# Patient Record
Sex: Male | Born: 1935 | Race: White | Hispanic: No | Smoking: Never smoker
Health system: Southern US, Community
[De-identification: ages and names within clinical notes are randomized; demographics above are authoritative.]

## PROBLEM LIST (undated history)

## (undated) DIAGNOSIS — I452 Bifascicular block: Secondary | ICD-10-CM

## (undated) DIAGNOSIS — E119 Type 2 diabetes mellitus without complications: Secondary | ICD-10-CM

## (undated) DIAGNOSIS — I2510 Atherosclerotic heart disease of native coronary artery without angina pectoris: Secondary | ICD-10-CM

## (undated) DIAGNOSIS — I10 Essential (primary) hypertension: Secondary | ICD-10-CM

## (undated) DIAGNOSIS — I471 Supraventricular tachycardia, unspecified: Secondary | ICD-10-CM

## (undated) DIAGNOSIS — I5020 Unspecified systolic (congestive) heart failure: Secondary | ICD-10-CM

## (undated) HISTORY — DX: Supraventricular tachycardia: I47.1

## (undated) HISTORY — PX: CARDIAC SURGERY: SHX584

## (undated) HISTORY — DX: Supraventricular tachycardia, unspecified: I47.10

## (undated) NOTE — *Deleted (*Deleted)
Patient seen and discussed with PA Iran Ouch, I agree with her documentation. Admitted with acute systolic HF, new drop in LVEF to 25%. After discussion with patient, family and his primary cardiologist Dr Jens Som for now treat medically and have not elected for cath due to advanced age. 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  Significant diuresis this admission, by I/Os 9.3L. Yesterday we changed him to torsemide 40mg  daily. Cr overall stable on oral regimen. Will need close follow and repeat labs to reassess diuretic dosing.  Cough this morning, defer workup to primary team whether to recheck CXR/cbc  We will sign off inpatient care.    Dina Rich MD

---

## 2012-05-11 ENCOUNTER — Encounter: Payer: Self-pay | Admitting: Physician Assistant

## 2012-10-02 ENCOUNTER — Encounter (HOSPITAL_COMMUNITY): Payer: Self-pay | Admitting: *Deleted

## 2012-10-02 ENCOUNTER — Inpatient Hospital Stay (HOSPITAL_COMMUNITY)
Admission: EM | Admit: 2012-10-02 | Discharge: 2012-10-07 | DRG: 247 | Disposition: A | Discharge status: Home / Self Care | Payer: Medicare Other | Attending: Cardiology | Admitting: Cardiology

## 2012-10-02 ENCOUNTER — Emergency Department (HOSPITAL_COMMUNITY): Payer: Medicare Other

## 2012-10-02 DIAGNOSIS — I471 Supraventricular tachycardia, unspecified: Secondary | ICD-10-CM | POA: Diagnosis present

## 2012-10-02 DIAGNOSIS — E119 Type 2 diabetes mellitus without complications: Secondary | ICD-10-CM

## 2012-10-02 DIAGNOSIS — I2510 Atherosclerotic heart disease of native coronary artery without angina pectoris: Secondary | ICD-10-CM | POA: Diagnosis present

## 2012-10-02 DIAGNOSIS — I25810 Atherosclerosis of coronary artery bypass graft(s) without angina pectoris: Secondary | ICD-10-CM | POA: Diagnosis present

## 2012-10-02 DIAGNOSIS — Z87891 Personal history of nicotine dependence: Secondary | ICD-10-CM

## 2012-10-02 DIAGNOSIS — D649 Anemia, unspecified: Secondary | ICD-10-CM

## 2012-10-02 DIAGNOSIS — I2582 Chronic total occlusion of coronary artery: Secondary | ICD-10-CM | POA: Diagnosis present

## 2012-10-02 DIAGNOSIS — Z7902 Long term (current) use of antithrombotics/antiplatelets: Secondary | ICD-10-CM

## 2012-10-02 DIAGNOSIS — R509 Fever, unspecified: Secondary | ICD-10-CM

## 2012-10-02 DIAGNOSIS — I10 Essential (primary) hypertension: Secondary | ICD-10-CM | POA: Diagnosis present

## 2012-10-02 DIAGNOSIS — Z79899 Other long term (current) drug therapy: Secondary | ICD-10-CM

## 2012-10-02 DIAGNOSIS — Z8249 Family history of ischemic heart disease and other diseases of the circulatory system: Secondary | ICD-10-CM

## 2012-10-02 DIAGNOSIS — Z23 Encounter for immunization: Secondary | ICD-10-CM

## 2012-10-02 DIAGNOSIS — Z7982 Long term (current) use of aspirin: Secondary | ICD-10-CM

## 2012-10-02 DIAGNOSIS — Z955 Presence of coronary angioplasty implant and graft: Secondary | ICD-10-CM

## 2012-10-02 DIAGNOSIS — R7301 Impaired fasting glucose: Secondary | ICD-10-CM

## 2012-10-02 DIAGNOSIS — I214 Non-ST elevation (NSTEMI) myocardial infarction: Principal | ICD-10-CM | POA: Diagnosis present

## 2012-10-02 HISTORY — DX: Atherosclerotic heart disease of native coronary artery without angina pectoris: I25.10

## 2012-10-02 HISTORY — DX: Bifascicular block: I45.2

## 2012-10-02 HISTORY — DX: Type 2 diabetes mellitus without complications: E11.9

## 2012-10-02 HISTORY — DX: Essential (primary) hypertension: I10

## 2012-10-02 LAB — COMPREHENSIVE METABOLIC PANEL
ALT: 15 U/L (ref 0–53)
AST: 36 U/L (ref 0–37)
Albumin: 3.6 g/dL (ref 3.5–5.2)
Chloride: 99 mEq/L (ref 96–112)
Creatinine, Ser: 1.23 mg/dL (ref 0.50–1.35)
Sodium: 137 mEq/L (ref 135–145)
Total Bilirubin: 0.6 mg/dL (ref 0.3–1.2)

## 2012-10-02 LAB — CBC
MCV: 90.8 fL (ref 78.0–100.0)
Platelets: 140 10*3/uL — ABNORMAL LOW (ref 150–400)
RDW: 12.7 % (ref 11.5–15.5)
WBC: 5.6 10*3/uL (ref 4.0–10.5)

## 2012-10-02 LAB — GLUCOSE, CAPILLARY: Glucose-Capillary: 172 mg/dL — ABNORMAL HIGH (ref 70–99)

## 2012-10-02 LAB — POCT I-STAT TROPONIN I

## 2012-10-02 LAB — TSH: TSH: 1.677 u[IU]/mL (ref 0.350–4.500)

## 2012-10-02 LAB — PROTIME-INR: Prothrombin Time: 13.7 seconds (ref 11.6–15.2)

## 2012-10-02 LAB — MAGNESIUM: Magnesium: 2.1 mg/dL (ref 1.5–2.5)

## 2012-10-02 MED ORDER — ASPIRIN 325 MG PO TABS
325.0000 mg | ORAL_TABLET | Freq: Once | ORAL | Status: AC
  Administered 2012-10-02: 325 mg via ORAL
  Filled 2012-10-02: qty 1

## 2012-10-02 MED ORDER — CLOPIDOGREL BISULFATE 75 MG PO TABS
75.0000 mg | ORAL_TABLET | Freq: Every day | ORAL | Status: DC
  Administered 2012-10-03 – 2012-10-07 (×5): 75 mg via ORAL
  Filled 2012-10-02 (×7): qty 1

## 2012-10-02 MED ORDER — INFLUENZA VIRUS VACC SPLIT PF IM SUSP
0.5000 mL | INTRAMUSCULAR | Status: AC
  Filled 2012-10-02: qty 0.5

## 2012-10-02 MED ORDER — LOSARTAN POTASSIUM 50 MG PO TABS
50.0000 mg | ORAL_TABLET | Freq: Every day | ORAL | Status: DC
  Administered 2012-10-03 – 2012-10-07 (×5): 50 mg via ORAL
  Filled 2012-10-02 (×5): qty 1

## 2012-10-02 MED ORDER — INSULIN ASPART 100 UNIT/ML ~~LOC~~ SOLN
0.0000 [IU] | Freq: Three times a day (TID) | SUBCUTANEOUS | Status: DC
  Administered 2012-10-02 – 2012-10-04 (×3): 2 [IU] via SUBCUTANEOUS
  Administered 2012-10-05: 3 [IU] via SUBCUTANEOUS
  Administered 2012-10-05: 2 [IU] via SUBCUTANEOUS
  Administered 2012-10-06 – 2012-10-07 (×2): 1 [IU] via SUBCUTANEOUS

## 2012-10-02 MED ORDER — PNEUMOCOCCAL VAC POLYVALENT 25 MCG/0.5ML IJ INJ
0.5000 mL | INJECTION | INTRAMUSCULAR | Status: AC
  Filled 2012-10-02: qty 0.5

## 2012-10-02 MED ORDER — ATORVASTATIN CALCIUM 10 MG PO TABS
10.0000 mg | ORAL_TABLET | Freq: Every day | ORAL | Status: DC
  Administered 2012-10-02 – 2012-10-06 (×5): 10 mg via ORAL
  Filled 2012-10-02 (×7): qty 1

## 2012-10-02 MED ORDER — NITROGLYCERIN 0.4 MG SL SUBL: 0.4000 mg | SUBLINGUAL_TABLET | SUBLINGUAL | Status: DC | PRN

## 2012-10-02 MED ORDER — ASPIRIN 81 MG PO CHEW
324.0000 mg | CHEWABLE_TABLET | ORAL | Status: AC
  Administered 2012-10-03: 324 mg via ORAL
  Filled 2012-10-02: qty 4

## 2012-10-02 MED ORDER — CLOPIDOGREL BISULFATE 300 MG PO TABS
300.0000 mg | ORAL_TABLET | Freq: Once | ORAL | Status: AC
  Administered 2012-10-02: 300 mg via ORAL
  Filled 2012-10-02: qty 1

## 2012-10-02 MED ORDER — DOXAZOSIN MESYLATE 4 MG PO TABS
4.0000 mg | ORAL_TABLET | Freq: Every day | ORAL | Status: DC
  Administered 2012-10-02 – 2012-10-06 (×5): 4 mg via ORAL
  Filled 2012-10-02 (×7): qty 1

## 2012-10-02 MED ORDER — HEPARIN BOLUS VIA INFUSION
4000.0000 [IU] | Freq: Once | INTRAVENOUS | Status: AC
  Administered 2012-10-02: 4000 [IU] via INTRAVENOUS

## 2012-10-02 MED ORDER — SODIUM CHLORIDE 0.9 % IJ SOLN: 3.0000 mL | INTRAMUSCULAR | Status: DC | PRN

## 2012-10-02 MED ORDER — SODIUM CHLORIDE 0.9 % IV SOLN
INTRAVENOUS | Status: DC
  Administered 2012-10-03 (×2): via INTRAVENOUS

## 2012-10-02 MED ORDER — SODIUM CHLORIDE 0.9 % IV SOLN
250.0000 mL | INTRAVENOUS | Status: DC | PRN
  Administered 2012-10-03: 250 mL via INTRAVENOUS

## 2012-10-02 MED ORDER — ACETAMINOPHEN 325 MG PO TABS
650.0000 mg | ORAL_TABLET | ORAL | Status: DC | PRN
  Administered 2012-10-03 – 2012-10-05 (×3): 650 mg via ORAL
  Filled 2012-10-02 (×3): qty 2

## 2012-10-02 MED ORDER — HEPARIN (PORCINE) IN NACL 100-0.45 UNIT/ML-% IJ SOLN
1000.0000 [IU]/h | INTRAMUSCULAR | Status: DC
  Administered 2012-10-02 – 2012-10-03 (×2): 1000 [IU]/h via INTRAVENOUS
  Filled 2012-10-02 (×4): qty 250

## 2012-10-02 MED ORDER — METOPROLOL TARTRATE 12.5 MG HALF TABLET
12.5000 mg | ORAL_TABLET | Freq: Two times a day (BID) | ORAL | Status: DC
  Administered 2012-10-02 – 2012-10-03 (×3): 12.5 mg via ORAL
  Filled 2012-10-02 (×4): qty 1

## 2012-10-02 MED ORDER — SODIUM CHLORIDE 0.9 % IJ SOLN
3.0000 mL | Freq: Two times a day (BID) | INTRAMUSCULAR | Status: DC
  Administered 2012-10-02 – 2012-10-05 (×7): 3 mL via INTRAVENOUS
  Administered 2012-10-06: 13:00:00 via INTRAVENOUS

## 2012-10-02 MED ORDER — ASPIRIN EC 81 MG PO TBEC
81.0000 mg | DELAYED_RELEASE_TABLET | Freq: Every day | ORAL | Status: DC
  Administered 2012-10-03 – 2012-10-07 (×5): 81 mg via ORAL
  Filled 2012-10-02 (×5): qty 1

## 2012-10-02 MED ORDER — ONDANSETRON HCL 4 MG/2ML IJ SOLN: 4.0000 mg | Freq: Four times a day (QID) | INTRAMUSCULAR | Status: DC | PRN

## 2012-10-02 NOTE — Progress Notes (Signed)
ANTICOAGULATION CONSULT NOTE - Follow-Up Consult  Pharmacy Consult for heparin Indication: chest pain/ACS  No Known Allergies  Patient Measurements: Height: 5\' 11"  (180.3 cm) Weight: 171 lb 11.8 oz (77.9 kg) IBW/kg (Calculated) : 75.3  Heparin Dosing Weight: 78.2kg  Vital Signs: Temp: 98.5 F (36.9 C) (01/27 2000) Temp src: Oral (01/27 2000) BP: 117/81 mmHg (01/27 2000) Pulse Rate: 126  (01/27 2000)  Labs:  Basename 10/02/12 2010 10/02/12 1619 10/02/12 1050  HGB -- -- 13.0  HCT -- -- 38.7*  PLT -- -- 140*  APTT -- -- --  LABPROT -- 13.7 --  INR -- 1.06 --  HEPARINUNFRC 0.41 -- --  CREATININE -- -- 1.23  CKTOTAL -- -- --  CKMB -- -- --  TROPONINI -- 1.69* --    Estimated Creatinine Clearance: 44.2 ml/min (by C-G formula based on Cr of 1.23).   Medical History: Past Medical History  Diagnosis Date  . Coronary artery disease     a. CABG 1993 at [facility], without cath since.  . Diabetes mellitus without complication   . HTN (hypertension)     Medications:  Infusions:     . sodium chloride    . heparin 1,000 Units/hr (10/02/12 2000)    Assessment: Heparin level therapeutic. No bleeding noted. Noted plan for cardiac cath tomorrow.  Goal of Therapy:  Heparin level 0.3-0.7 units/ml Monitor platelets by anticoagulation protocol: Yes   Plan:  1. Continue Heparin gtt at 1000 units/hr 2. Daily heparin level and CBC  Christoper Fabian, PharmD, BCPS Clinical pharmacist, pager [phone] 10/02/2012,8:55 PM

## 2012-10-02 NOTE — Progress Notes (Addendum)
Per discussion with Dr. Jens Som, will load with Plavix 300mg  today and start 75mg  daily tomorrow. Plan cardiac cath tomorrow. Dayna Dunn PA-C

## 2012-10-02 NOTE — Progress Notes (Signed)
Utilization Review Completed.Joud Ingwersen T12/03/2013  

## 2012-10-02 NOTE — ED Provider Notes (Signed)
History     CSN: [id number]  Arrival date & time 10/02/12  1029   First MD Initiated Contact with Patient 10/02/12 1058      Chief Complaint  Patient presents with  . Chest Pain    (Consider location/radiation/quality/duration/timing/severity/associated sxs/prior treatment) The history is provided by the patient.  Franklin Gonzalez is a 79 y.o. male history of CAD with no stents, diabetes, hypertension here presenting with chest pain. Substernal chest pain, a dull achy feeling for the last 3 days. He was worse with exertion and not pleuritic in nature. No fevers or chills. No history of cardiac stents or heart failure. No recent travel or PE risk factors. Has positive troponin in triage. Currently pain free.    Past Medical History  Diagnosis Date  . Coronary artery disease     a. CABG 1993 at [facility], without cath since.  . Diabetes mellitus without complication   . HTN (hypertension)     Past Surgical History  Procedure Date  . Cardiac surgery     1993 CABG    Family History  Problem Relation Age of Onset  . CAD      multiple relatives including father and son    History  Substance Use Topics  . Smoking status: Former Games developer  . Smokeless tobacco: Not on file  . Alcohol Use: No      Review of Systems  Cardiovascular: Positive for chest pain.  All other systems reviewed and are negative.    Allergies  Review of patient's allergies indicates no known allergies.  Home Medications   Current Outpatient Rx  Name  Route  Sig  Dispense  Refill  . ATORVASTATIN CALCIUM 10 MG PO TABS   Oral   Take 10 mg by mouth at bedtime.         Marland Kitchen DOXAZOSIN MESYLATE 4 MG PO TABS   Oral   Take 4 mg by mouth at bedtime.         Marland Kitchen LOSARTAN POTASSIUM-HCTZ 50-12.5 MG PO TABS   Oral   Take 1 tablet by mouth daily.         Marland Kitchen METFORMIN HCL ER 500 MG PO TB24   Oral   Take 500 mg by mouth daily with breakfast.           BP 114/82  Pulse 125  Temp 97.7 F (36.5 C)  (Oral)  Resp 23  SpO2 100%  Physical Exam  Nursing note and vitals reviewed. Constitutional: He is oriented to person, place, and time. He appears well-developed and well-nourished.       NAD, comfortable   HENT:  Head: Normocephalic.  Mouth/Throat: Oropharynx is clear and moist.  Eyes: Conjunctivae normal are normal. Pupils are equal, round, and reactive to light.  Neck: Normal range of motion. Neck supple.  Cardiovascular: Normal rate, regular rhythm and normal heart sounds.   Pulmonary/Chest: Effort normal and breath sounds normal. No respiratory distress. He has no wheezes. He has no rales.  Abdominal: Soft. Bowel sounds are normal. He exhibits no distension. There is no tenderness. There is no rebound.  Musculoskeletal: Normal range of motion. He exhibits no edema and no tenderness.  Neurological: He is alert and oriented to person, place, and time.  Skin: Skin is warm and dry.  Psychiatric: He has a normal mood and affect. His behavior is normal. Judgment and thought content normal.    ED Course  Procedures (including critical care time)  CRITICAL CARE Performed by: Chaney Malling  Total critical care time: 30 min   Critical care time was exclusive of separately billable procedures and treating other patients.  Critical care was necessary to treat or prevent imminent or life-threatening deterioration.  Critical care was time spent personally by me on the following activities: development of treatment plan with patient and/or surrogate as well as nursing, discussions with consultants, evaluation of patient's response to treatment, examination of patient, obtaining history from patient or surrogate, ordering and performing treatments and interventions, ordering and review of laboratory studies, ordering and review of radiographic studies, pulse oximetry and re-evaluation of patient's condition.   Labs Reviewed  CBC - Abnormal; Notable for the following:    HCT 38.7 (*)      Platelets 140 (*)     All other components within normal limits  COMPREHENSIVE METABOLIC PANEL - Abnormal; Notable for the following:    Glucose, Bld 119 (*)     GFR calc non Af Amer 51 (*)     GFR calc Af Amer 59 (*)     All other components within normal limits  POCT I-STAT TROPONIN I - Abnormal; Notable for the following:    Troponin i, poc 1.34 (*)     All other components within normal limits  HEPARIN LEVEL (UNFRACTIONATED)   Dg Chest 2 View  10/02/2012  *RADIOLOGY REPORT*  Clinical Data: Chest pain.  CHEST - 2 VIEW  Comparison: 06/24/2010.  Findings: The cardiac silhouette, mediastinal and hilar contours are within normal limits.  There are surgical changes from bypass surgery.  The lungs demonstrate mild hyperinflation but no infiltrates, edema or effusions.  No pneumothorax.  Minimal scarring changes.  Bilateral nipple shadows are noted.  The bony thorax is intact.  IMPRESSION: No acute cardiopulmonary findings.   Original Report Authenticated By: Rudie Meyer, M.D.      1. Acute myocardial infarction, subendocardial infarction, initial episode of care      Date: 10/02/2012- 1  Rate: 76  Rhythm: sinus tachycardia  QRS Axis: normal  Intervals: normal  ST/T Wave abnormalities: ST depressions inferiorly and ST depressions laterally  Conduction Disutrbances:second-degree A-V block, ( Mobitz I )  Narrative Interpretation:   Old EKG Reviewed: none available   Date: 10/02/2012- 2   Rate: 73  Rhythm: normal sinus rhythm  QRS Axis: normal  Intervals: normal  ST/T Wave abnormalities: nonspecific ST changes  Conduction Disutrbances:none  Narrative Interpretation: + PAC, not heart block as previous EKG in the same day   Old EKG Reviewed: unchanged     MDM  Franklin Gonzalez is a 79 y.o. male here with chest pain + troponin, likely NSTEMI. Will give ASA, start heparin drip.   12: 10 PM Patient still pain free. I called [facility] to evaluate patient.   1:08 PM Patient  evaluated by Corinda Gubler, will admit for NSTEMI.         Richardean Canal, MD 10/02/12 1309

## 2012-10-02 NOTE — ED Notes (Signed)
Cardiology at bedside.

## 2012-10-02 NOTE — ED Notes (Signed)
Pt is here with 4 day history of uncomfortable feeling in chest and no other associated symptoms.  Pt is alert and oriented.  CABG 1993

## 2012-10-02 NOTE — H&P (Signed)
History and Physical  Patient ID: Franklin Gonzalez MRN: [id number], DOB: 1922-03-17 Date of Encounter: 10/02/2012, 12:35 PM Primary Physician: Louie Boston, MD Primary Cardiologist: New to [facility]. Remotely seen at [facility]. Lives in [place]  Chief Complaint: chest pain Reason for Admit: NSTEMI  HPI: Franklin Gonzalez is an 79 y/o fairly functional WM with history of CAD s/p CABG 1993 without cath since, DM, HTN, and dizziness lasting several hours one morning 08/2012 evaluated at [facility] without obvious etiology per family (reported CT head neg per family). He lives in [place] in a 2-story home with his wife. He presented to [facility] with 4-day history of intermittent L>R sided chest pain described as an "uncomfortable" ache, worse with exertion, typically relieved by rest. Yesterday he walked 6/10 of a mile to church and developed CP. This eventually eased off. No SOB, diaphoresis, dizziness, nausea, or vomiting. Up until a week ago, he was walking 2 miles per day without complication. He had persistent discomfort this AM and told his son who brought him straight to the [facility] ER. He was found to have an initial troponin of 1.34, labs otherwise significant for glu 119, plt 140, Cr 1.23 with CrCl ~45. CXR nonacute. He is currently getting 325mg  ASA and full-dose heparin by EDP. Initial EKG demonstrated NSR with multiple PACs, RBBB, LAFB and nonspecific ST-T changes. He also went into a tachycardic, regular rhythm at 125bpm while in the ER, same axis as admitting rhythm. With carotid massage by Dr. Jens Som, he went back into NSR with PACs without any obvious flutter waves raising suspicion for SVT. He is currently chest pain free.  Past Medical History  Diagnosis Date  . Coronary artery disease     a. CABG 1993 at [facility], without cath since.  . Diabetes mellitus without complication   . HTN (hypertension)   . Syncope     a. 08/2012, evaluated at [facility].     Most Recent Cardiac Studies: none    Surgical History:  Past Surgical History  Procedure Date  . Cardiac surgery     1993 CABG     Home Meds: Prior to Admission medications   Medication Sig Start Date End Date Taking? Authorizing Provider  atorvastatin (LIPITOR) 10 MG tablet Take 10 mg by mouth at bedtime.   Yes Historical Provider, MD  doxazosin (CARDURA) 4 MG tablet Take 4 mg by mouth at bedtime.   Yes Historical Provider, MD  losartan-hydrochlorothiazide (HYZAAR) 50-12.5 MG per tablet Take 1 tablet by mouth daily.   Yes Historical Provider, MD  metFORMIN (GLUCOPHAGE-XR) 500 MG 24 hr tablet Take 500 mg by mouth daily with breakfast.   Yes Historical Provider, MD    Allergies: No Known Allergies  History   Social History  . Marital Status: Married    Spouse Name: N/A    Number of Children: N/A  . Years of Education: N/A   Occupational History  . Not on file.   Social History Main Topics  . Smoking status: Former Games developer  . Smokeless tobacco: Not on file  . Alcohol Use: No  . Drug Use: No  . Sexually Active:    Other Topics Concern  . Not on file   Social History Narrative  . No narrative on file     Family History  Problem Relation Age of Onset  . CAD      multiple relatives including father and son    Review of Systems: General: negative for chills, fever, night sweats or weight  changes.  Cardiovascular: no orthopnea, PND. Tr LEE L>R goes down at night Dermatological: negative for rash Respiratory: negative for cough or wheezing Urologic: negative for hematuria Abdominal: negative for nausea, vomiting, diarrhea, bright red blood per rectum, melena, or hematemesis Neurologic: negative for visual changes, syncope, or dizziness All other systems reviewed and are otherwise negative except as noted above.  Labs:   Lab Results  Component Value Date   WBC 5.6 10/02/2012   HGB 13.0 10/02/2012   HCT 38.7* 10/02/2012   MCV 90.8 10/02/2012   PLT 140* 10/02/2012     Lab 10/02/12 1050  NA 137   K 4.4  CL 99  CO2 29  BUN 21  CREATININE 1.23  CALCIUM 9.6  PROT 6.3  BILITOT 0.6  ALKPHOS 76  ALT 15  AST 36  GLUCOSE 119*   POC Troponin 1.34  Radiology/Studies:  Dg Chest 2 View 10/02/2012  *RADIOLOGY REPORT*  Clinical Data: Chest pain.  CHEST - 2 VIEW  Comparison: 06/24/2010.  Findings: The cardiac silhouette, mediastinal and hilar contours are within normal limits.  There are surgical changes from bypass surgery.  The lungs demonstrate mild hyperinflation but no infiltrates, edema or effusions.  No pneumothorax.  Minimal scarring changes.  Bilateral nipple shadows are noted.  The bony thorax is intact.  IMPRESSION: No acute cardiopulmonary findings.   Original Report Authenticated By: Rudie Meyer, M.D.     EKG: NSR 76bpm with multiple PACs, RBBB, LAFB, slight ST depression II, V4-V6 (max 1mm in V4, otherwise 0.73mm), no prior to compare to F/u tracing while tachycardic: SVT (regular, tachycardic) 127bpm, RBBB, LAFB, new ST depression noted in V2, otherwise similar more subtle ST depression II, V3-V6 - no flutter waves apparent with carotid massage  Physical Exam: Blood pressure 165/79, pulse 73, temperature 97.7 F (36.5 C), temperature source Oral, resp. rate 17, SpO2 100.00%. General: Well developed, well nourished elderly WM in no acute distress. Head: Normocephalic, atraumatic, sclera non-icteric, no xanthomas, nares are without discharge.  Neck: Negative for carotid bruits. JVD not elevated. Lungs: Clear bilaterally to auscultation without wheezes, rales, or rhonchi. Breathing is unlabored. Heart: Irregular rhythm, controlled rate with S1 S2. No murmurs, rubs, or gallops appreciated. Abdomen: Soft, non-tender, non-distended with normoactive bowel sounds. No hepatomegaly. No rebound/guarding. No obvious abdominal masses. Msk:  Strength and tone appear normal for age. Extremities: No clubbing or cyanosis. No edema.  Distal pedal pulses are 2+ and equal bilaterally. Neuro:  Alert and oriented X 3. No focal deficit. No facial asymmetry. Moves all extremities spontaneously. Psych:  Responds to questions appropriately with a normal affect.    ASSESSMENT AND PLAN:   1. NSTEMI with history of CAD s/p CABG 1993 - received ASA, heparin. Continue statin. See below for comprehensive thoughts after discussion with Dr. Jens Som. 2. SVT - no flutter waves with carotid massage. See below. 3. Bifasicular block - will need to monitor HR closely with AV nodal agents. 4. Dizziness 08/2012 - will request records from [facility]. 5. HTN - watch BP with measures initiated for the above. 6. Diabetes mellitus - hold metformin and add SSI. 7. Renal insufficiency, stage III - baseline unavailable, follow Cr, may be chronic. 8. Code status - per our discussion, patient has not given much thought to EOL decisions and would like to think more about it. In the meantime, he will continue being a full code.  Signed, Dayna Dunn PA-C 10/02/2012, 12:35 PM  As above, patient seen and examined. Briefly he is a  pleasant 79 year old male with past medical history of coronary artery disease status post coronary artery bypass and graft at [facility] in 1993. He also has diabetes, hypertension. Over the past 4 days he has had intermittent left-sided chest pain. It is described as a dull ache. It initially occurred with exercise and was relieved with rest. He had a more prolonged episode today and told his son who brought him to the emergency room. He is presently pain-free. He did not have nausea, diaphoresis or dyspnea. While in the emergency room he was noted to go into SVT at a rate of 127. He converted to sinus rhythm with carotid massage. Note he was admitted to [facility] in December with dizziness. A CT of his head was performed and I do not have those results. Initial troponin 1.34. Electrocardiogram shows sinus rhythm, right bundle branch block, left anterior fascicular block,  inferior lateral ST depression and PACs. Patient has ruled in for a non-ST elevation myocardial infarction. Plan admit and treat with aspirin, Plavix and heparin. Add metoprolol 12.5 mg by mouth twice a day. Watch for bradycardia given baseline bifascicular block. Add statin. Proceed with cardiac catheterization. The risks and benefits were discussed and the patient agrees to proceed. We will obtain the results of his recent head CT from [facility]. Patient also has supraventricular tachycardia that terminated with carotid massage. We will treat with a beta blocker. If he has recurrent episodes despite medications he will be considered for ablation. Check echo and TSH. Olga Millers 12:52 PM

## 2012-10-02 NOTE — ED Notes (Signed)
Results of troponin shown to Dr. Chaney Malling

## 2012-10-02 NOTE — Progress Notes (Signed)
ANTICOAGULATION CONSULT NOTE - Initial Consult  Pharmacy Consult for heparin Indication: chest pain/ACS  No Known Allergies  Patient Measurements:   Heparin Dosing Weight: 78.2kg  Vital Signs: Temp: 97.7 F (36.5 C) (01/27 1038) Temp src: Oral (01/27 1038) BP: 157/87 mmHg (01/27 1038) Pulse Rate: 125  (01/27 1038)  Labs:  Basename 10/02/12 1050  HGB 13.0  HCT 38.7*  PLT 140*  APTT --  LABPROT --  INR --  HEPARINUNFRC --  CREATININE --  CKTOTAL --  CKMB --  TROPONINI --    CrCl is unknown because no creatinine reading has been taken and the patient has no height on file.   Medical History: Past Medical History  Diagnosis Date  . Coronary artery disease   . Diabetes mellitus without complication     Medications:  Infusions:    . heparin    . heparin      Assessment: 48 yom with history of CAD s/p CABG presents with CP and elevated troponins. To begin IV heparin for anticoagulation. Baseline H/H is WNL but plts are slightly low at 140. No anticoagulants PTA.   Goal of Therapy:  Heparin level 0.3-0.7 units/ml Monitor platelets by anticoagulation protocol: Yes   Plan:  1. Heparin bolus 4000 units IV x 1 2. Heparin gtt 1000 units/hr 3. Check an 8 hour heparin level 4. Daily heparin level and CBC  Ramonte Mena, Drake Leach 10/02/2012,11:44 AM

## 2012-10-03 ENCOUNTER — Encounter (HOSPITAL_COMMUNITY): Payer: Self-pay | Admitting: Internal Medicine

## 2012-10-03 ENCOUNTER — Encounter (HOSPITAL_COMMUNITY)
Admission: EM | Disposition: A | Discharge status: Home / Self Care | Payer: Self-pay | Source: Home / Self Care | Attending: Cardiology

## 2012-10-03 DIAGNOSIS — R7301 Impaired fasting glucose: Secondary | ICD-10-CM | POA: Diagnosis present

## 2012-10-03 DIAGNOSIS — I219 Acute myocardial infarction, unspecified: Secondary | ICD-10-CM

## 2012-10-03 DIAGNOSIS — I10 Essential (primary) hypertension: Secondary | ICD-10-CM | POA: Diagnosis present

## 2012-10-03 DIAGNOSIS — I471 Supraventricular tachycardia, unspecified: Secondary | ICD-10-CM | POA: Diagnosis present

## 2012-10-03 DIAGNOSIS — E119 Type 2 diabetes mellitus without complications: Secondary | ICD-10-CM

## 2012-10-03 DIAGNOSIS — I369 Nonrheumatic tricuspid valve disorder, unspecified: Secondary | ICD-10-CM

## 2012-10-03 LAB — CBC
MCH: 31 pg (ref 26.0–34.0)
MCHC: 34.1 g/dL (ref 30.0–36.0)
MCV: 90.7 fL (ref 78.0–100.0)
Platelets: 127 10*3/uL — ABNORMAL LOW (ref 150–400)
RDW: 12.6 % (ref 11.5–15.5)

## 2012-10-03 LAB — LIPID PANEL
Cholesterol: 126 mg/dL (ref 0–200)
VLDL: 8 mg/dL (ref 0–40)

## 2012-10-03 LAB — TROPONIN I: Troponin I: 2.03 ng/mL (ref <0.30)

## 2012-10-03 LAB — GLUCOSE, CAPILLARY
Glucose-Capillary: 125 mg/dL — ABNORMAL HIGH (ref 70–99)
Glucose-Capillary: 102 mg/dL — ABNORMAL HIGH (ref 70–99)

## 2012-10-03 LAB — BASIC METABOLIC PANEL
Calcium: 9.1 mg/dL (ref 8.4–10.5)
Creatinine, Ser: 1.19 mg/dL (ref 0.50–1.35)
GFR calc Af Amer: 61 mL/min — ABNORMAL LOW (ref >90)
GFR calc non Af Amer: 53 mL/min — ABNORMAL LOW (ref >90)

## 2012-10-03 LAB — URINALYSIS, ROUTINE W REFLEX MICROSCOPIC
Protein, ur: NEGATIVE mg/dL
Urobilinogen, UA: 1 mg/dL (ref 0.0–1.0)

## 2012-10-03 LAB — HEMOGLOBIN A1C
Hgb A1c MFr Bld: 7 % — ABNORMAL HIGH (ref <5.7)
Mean Plasma Glucose: 154 mg/dL — ABNORMAL HIGH (ref <117)

## 2012-10-03 LAB — URINE MICROSCOPIC-ADD ON

## 2012-10-03 SURGERY — LEFT HEART CATHETERIZATION WITH CORONARY/GRAFT ANGIOGRAM
Anesthesia: LOCAL

## 2012-10-03 MED ORDER — METOPROLOL TARTRATE 1 MG/ML IV SOLN
2.5000 mg | INTRAVENOUS | Status: DC | PRN
  Administered 2012-10-03 – 2012-10-06 (×4): 2.5 mg via INTRAVENOUS
  Filled 2012-10-03 (×3): qty 5

## 2012-10-03 MED ORDER — SODIUM CHLORIDE 0.9 % IV SOLN
INTRAVENOUS | Status: DC
  Administered 2012-10-04: 06:00:00 via INTRAVENOUS

## 2012-10-03 MED ORDER — METOPROLOL TARTRATE 1 MG/ML IV SOLN
INTRAVENOUS | Status: AC
  Administered 2012-10-03: 2.5 mg via INTRAVENOUS
  Filled 2012-10-03: qty 5

## 2012-10-03 MED ORDER — METOPROLOL TARTRATE 25 MG PO TABS
25.0000 mg | ORAL_TABLET | Freq: Two times a day (BID) | ORAL | Status: DC
  Administered 2012-10-03 – 2012-10-07 (×8): 25 mg via ORAL
  Filled 2012-10-03 (×12): qty 1

## 2012-10-03 MED ORDER — AMIODARONE HCL 200 MG PO TABS
200.0000 mg | ORAL_TABLET | Freq: Two times a day (BID) | ORAL | Status: DC
  Administered 2012-10-03 – 2012-10-07 (×8): 200 mg via ORAL
  Filled 2012-10-03 (×10): qty 1

## 2012-10-03 NOTE — Progress Notes (Signed)
Pt appeared to have episode of a-fib. Theodore Demark PA notified. Pt remains on heparin gtt and po amiodarone. 12 Lead EKG showed sinus rhythm with PACs and RBBB. HR remains 70-90s. Pt resting comfortably in bed. Denies pain or discomfort. Will cont to monitor.

## 2012-10-03 NOTE — Progress Notes (Signed)
Theodore Demark PA updated on pt status, vital signs. No orders received. PT resting in bed.

## 2012-10-03 NOTE — Progress Notes (Signed)
ELECTROPHYSIOLOGY CONSULT NOTE     Primary Care Physician: Louie Boston, MD Referring Physician:  Dr Eden Emms  Admit Date: 10/02/2012  Reason for consultation:SVT  Franklin Gonzalez is a 79 y.o. male with a h/o CAD who is admitted with symptoms of chest pain and SVT.  He reports having several days of exertional chest pain.  This pain would resolve with resting.  Up until a week ago, he was walking 2 miles per day without complication.  He ruled in for NSTEMI.  Though he presented to [facility] in sinus rhythm, he has had nearly incessant SVT at 125 bpm.  Per report, this termianted with carotid massage by Dr Jens Som in the ER.  He has had recurrent SVT frequently since that time.  He has been mostly asymptomatic with this.  In addition, he has had post termination pauses from the arrhythmia for which he has also been asymptomatic.  He was planned for cath earlier today however this was cancelled due to SVT. Presently, he is resting comfortably.  He had had no further chest pain.  Today, he denies symptoms of palpitations, shortness of breath, orthopnea, PND, lower extremity edema, dizziness, presyncope, syncope, or neurologic sequela. The patient is tolerating medications without difficulties and is otherwise without complaint today.   Past Medical History  Diagnosis Date  . Coronary artery disease     a. CABG 1993 at [facility], without cath since.  . Diabetes mellitus without complication   . HTN (hypertension)   . Bifascicular block    Past Surgical History  Procedure Date  . Cardiac surgery     1993 CABG       . aspirin EC  81 mg Oral Daily  . atorvastatin  10 mg Oral QHS  . clopidogrel  75 mg Oral Q breakfast  . doxazosin  4 mg Oral QHS  . influenza  inactive virus vaccine  0.5 mL Intramuscular Tomorrow-1000  . insulin aspart  0-9 Units Subcutaneous TID WC  . losartan  50 mg Oral Daily  . metoprolol tartrate  12.5 mg Oral BID  . pneumococcal 23 valent vaccine  0.5 mL  Intramuscular Tomorrow-1000  . sodium chloride  3 mL Intravenous Q12H      . sodium chloride 75 mL/hr at 10/03/12 0732  . heparin 1,000 Units/hr (10/03/12 0732)    No Known Allergies  History   Social History  . Marital Status: Married    Spouse Name: N/A    Number of Children: N/A  . Years of Education: N/A   Occupational History  . Not on file.   Social History Main Topics  . Smoking status: Former Games developer  . Smokeless tobacco: Not on file  . Alcohol Use: No  . Drug Use: No  . Sexually Active:    Other Topics Concern  . Not on file   Social History Narrative  . No narrative on file    Family History  Problem Relation Age of Onset  . CAD      multiple relatives including father and son    ROS- All systems are reviewed and negative except as per the HPI above  Physical Exam: Telemetry:  Frequent and prolonged SVT, sinus with pacs,  Filed Vitals:   10/03/12 1131 10/03/12 1200 10/03/12 1300 10/03/12 1400  BP:  120/76 104/70 121/59  Pulse:  119 120 75  Temp: 100 F (37.8 C)     TempSrc: Oral     Resp:  21 16 19   Height:  Weight:      SpO2:  96% 97% 94%    GEN- The patient is well appearing, alert and oriented x 3 today.   Head- normocephalic, atraumatic Eyes-  Sclera clear, conjunctiva pink Ears- hearing intact Oropharynx- clear Neck- supple, JVP 9cm Lungs- Clear to ausculation bilaterally, normal work of breathing Heart- Regular rate and rhythm, no murmurs, rubs or gallops, PMI not laterally displaced GI- soft, NT, ND, + BS Extremities- no clubbing, cyanosis, or edema MS- no significant deformity or atrophy Skin- no rash or lesion Psych- euthymic mood, full affect Neuro- strength and sensation are intact  EKG- sinus rhythm 68 bpm, PR 168, RBBB, LAHB, QRS 132, Qtc 474 ekg #2- RBB/LAHB SVT at 128 bpm Echo reviewed   Labs:   Lab Results  Component Value Date   WBC 8.0 10/03/2012   HGB 12.7* 10/03/2012   HCT 37.2* 10/03/2012   MCV 90.7  10/03/2012   PLT 127* 10/03/2012    Lab 10/03/12 0435 10/02/12 1050  NA 139 --  K 4.1 --  CL 103 --  CO2 25 --  BUN 20 --  CREATININE 1.19 --  CALCIUM 9.1 --  PROT -- 6.3  BILITOT -- 0.6  ALKPHOS -- 76  ALT -- 15  AST -- 36  GLUCOSE 134* --   Lab Results  Component Value Date   TROPONINI 2.03* 10/03/2012    Lab Results  Component Value Date   CHOL 126 10/03/2012   Lab Results  Component Value Date   HDL 64 10/03/2012   Lab Results  Component Value Date   LDLCALC 54 10/03/2012   Lab Results  Component Value Date   TRIG 40 10/03/2012   Lab Results  Component Value Date   CHOLHDL 2.0 10/03/2012   No results found for this basename: LDLDIRECT      ASSESSMENT AND PLAN:   1. SVT The patient has a mid RP tachycardia.  This is SVT and would either be Atrial tachycardia or AVNRT.  There are several strips on telemetry which document an unusual p wave morphology as SVT gradually terminates suggesting Atach, though termination with carotid massage would be more suggestive of reentry (AVNRT). Therapeutic strategies for supraventricular tachycardia including medicine and ablation were discussed in detail with the patient and his son today. At this time, they would prefer medical therapy.  I think that this is quite reasonable. He has had several post termination pauses for which he has been asymptomatic.  He also has bifascicular block without any documented AV block.  We will keep these in mind as we cautiously proceed with medical therapy.  As he has had no symptomatic pauses or brady episodes, we will plan to avoid PPM at this time.  Plan: 1. Increase metoprolol to 25mg  BID and titrated as required 2. Add amiodarone 200mg  BID   2. NSTEMI Given recent exertional chest pain and elevated CMs, I think that cardiac cath is prudent.  I would therefore recommend that he proceed with cath at the next available time.  3. HTN Stable No change required today   Hillis Range,  MD 10/03/2012  3:15 PM

## 2012-10-03 NOTE — Progress Notes (Signed)
ANTICOAGULATION CONSULT NOTE - Follow-Up Consult  Pharmacy Consult for heparin Indication: chest pain/ACS  No Known Allergies  Patient Measurements: Height: 5\' 11"  (180.3 cm) Weight: 169 lb 5 oz (76.8 kg) IBW/kg (Calculated) : 75.3  Heparin Dosing Weight: 78.2kg  Vital Signs: Temp: 99.4 F (37.4 C) (01/28 0800) Temp src: Oral (01/28 0800) BP: 130/71 mmHg (01/28 0900) Pulse Rate: 61  (01/28 0900)  Labs:  Basename 10/03/12 0435 10/02/12 2230 10/02/12 2010 10/02/12 1619 10/02/12 1050  HGB 12.7* -- -- -- 13.0  HCT 37.2* -- -- -- 38.7*  PLT 127* -- -- -- 140*  APTT -- -- -- -- --  LABPROT -- -- -- 13.7 --  INR -- -- -- 1.06 --  HEPARINUNFRC 0.40 -- 0.41 -- --  CREATININE 1.19 -- -- -- 1.23  CKTOTAL -- -- -- -- --  CKMB -- -- -- -- --  TROPONINI 2.03* 1.22* -- 1.69* --    Estimated Creatinine Clearance: 45.7 ml/min (by C-G formula based on Cr of 1.19).  Assessment: Chest pain/acs, heparin level continues to be therapeutic. No bleeding noted. Noted plan for cardiac cath today.  Goal of Therapy:  Heparin level 0.3-0.7 units/ml Monitor platelets by anticoagulation protocol: Yes   Plan:  1. Continue Heparin gtt at 1000 units/hr 2. Daily heparin level and CBC 3.Follow up after cath  Sheppard Coil, PharmD, BCPS Clinical pharmacist, pager [phone] 10/03/2012,10:12 AM

## 2012-10-03 NOTE — Progress Notes (Signed)
Pt with ?atrial tachycardia vs SVT. Pt had converted with carotid sinus massage last pm and again this morning.  The patient was EXTREMELY sensitive to carotid sinus massage on the right and converted to SR with frequent PACs after A 4.8 second pause with 1 QRS. The patient tolerated this well and his BP improved. Continue oral BB with IV BB PRN. Cath today.  Attending note:  Pt is basically asymptomatic with his SVT.  He had a rather prolonged pause after the conversion - He may need to be considered for pacer at some point.   Vesta Mixer, Montez Hageman., MD, [facility] 10/03/2012, 10:58 AM Office - [phone] Pager [phone]

## 2012-10-03 NOTE — Progress Notes (Signed)
  Echocardiogram 2D Echocardiogram has been performed.  Abdulhadi Stopa 10/03/2012, 9:22 AM

## 2012-10-03 NOTE — Progress Notes (Signed)
Rhonda Barrett PA updated on pt temp (100.0) and HR (123). Order received to check UA, give tylenol, and ok to give prn lopressor for HR.

## 2012-10-03 NOTE — Progress Notes (Signed)
Patient Name: Franklin Gonzalez Date of Encounter: 10/03/2012  Principal Problem:  *Acute myocardial infarction, subendocardial infarction, initial episode of care Active Problems:  SVT (supraventricular tachycardia)  Diabetes mellitus without complication  HTN (hypertension)    SUBJECTIVE: No awareness of elevated HR, no chest pain, no SOB, no dizziness.  OBJECTIVE Filed Vitals:   10/03/12 0400 10/03/12 0500 10/03/12 0600 10/03/12 0700  BP: 120/81 122/83 111/80 129/82  Pulse: 127 127 127 129  Temp: 98.3 F (36.8 C)     TempSrc: Oral     Resp: 19 19 17 17   Height:      Weight:  169 lb 5 oz (76.8 kg)    SpO2: 94% 95% 97% 97%    Intake/Output Summary (Last 24 hours) at 10/03/12 0810 Last data filed at 10/03/12 0600  Gross per 24 hour  Intake 811.67 ml  Output   1225 ml  Net -413.33 ml   Filed Weights   10/02/12 1610 10/03/12 0500  Weight: 171 lb 11.8 oz (77.9 kg) 169 lb 5 oz (76.8 kg)     PHYSICAL EXAM General: Well developed, well nourished, in no acute distress. Head: Normocephalic, atraumatic.  Neck: Supple without bruits, JVD. Lungs:  Resp regular and unlabored, CTA. Heart: RRR, S1, S2, no S3, S4, or murmur. Abdomen: Soft, non-tender, non-distended, BS + x 4.  Extremities: No clubbing, cyanosis, edema.  Neuro: Alert and oriented X 3. Moves all extremities spontaneously. Psych: Normal affect.  LABS: CBC:  Basename 10/03/12 0435 10/02/12 1050  WBC 8.0 5.6  NEUTROABS -- --  HGB 12.7* 13.0  HCT 37.2* 38.7*  MCV 90.7 90.8  PLT 127* 140*   INR:  Basename 10/02/12 1619  INR 1.06   Basic Metabolic Panel:  Basename 10/03/12 0435 10/02/12 1619 10/02/12 1050  NA 139 -- 137  K 4.1 -- 4.4  CL 103 -- 99  CO2 25 -- 29  GLUCOSE 134* -- 119*  BUN 20 -- 21  CREATININE 1.19 -- 1.23  CALCIUM 9.1 -- 9.6  MG -- 2.1 --  PHOS -- -- --   Liver Function Tests:  Basename 10/02/12 1050  AST 36  ALT 15  ALKPHOS 76  BILITOT 0.6  PROT 6.3  ALBUMIN 3.6    Cardiac Enzymes:  Basename 10/03/12 0435 10/02/12 2230 10/02/12 1619  CKTOTAL -- -- --  CKMB -- -- --  CKMBINDEX -- -- --  TROPONINI 2.03* 1.22* 1.69*    Basename 10/02/12 1058  TROPIPOC 1.34*   Hemoglobin A1C: pending Fasting Lipid Panel:  Basename 10/03/12 0435  CHOL 126  HDL 64  LDLCALC 54  TRIG 40  CHOLHDL 2.0  LDLDIRECT --   Thyroid Function Tests:  Basename 10/02/12 1619  TSH 1.677  T4TOTAL --  T3FREE --  THYROIDAB --   TELE: In ?Atrial tach vs SVT       ECG: 03-Oct-2012 06:51:56 [facility]U ROUTINE RECORD Wide QRS tachycardia Right bundle branch block Left anterior fascicular block ** Bifascicular block ** Abnormal ECG 86mm/s 56mm/mV 100Hz  8.0.1 12SL 241 HD CID: 1 Referred by: Unconfirmed Vent. rate 128 BPM PR interval * ms QRS duration 130 ms QT/QTc 356/519 ms P-R-T axes * -74 54  Radiology/Studies: Dg Chest 2 View 10/02/2012  *RADIOLOGY REPORT*  Clinical Data: Chest pain.  CHEST - 2 VIEW  Comparison: 06/24/2010.  Findings: The cardiac silhouette, mediastinal and hilar contours are within normal limits.  There are surgical changes from bypass surgery.  The lungs demonstrate mild hyperinflation but no  infiltrates, edema or effusions.  No pneumothorax.  Minimal scarring changes.  Bilateral nipple shadows are noted.  The bony thorax is intact.  IMPRESSION: No acute cardiopulmonary findings.   Original Report Authenticated By: Rudie Meyer, M.D.     Current Medications:     . aspirin EC  81 mg Oral Daily  . atorvastatin  10 mg Oral QHS  . clopidogrel  75 mg Oral Q breakfast  . doxazosin  4 mg Oral QHS  . influenza  inactive virus vaccine  0.5 mL Intramuscular Tomorrow-1000  . insulin aspart  0-9 Units Subcutaneous TID WC  . losartan  50 mg Oral Daily  . metoprolol tartrate  12.5 mg Oral BID  . pneumococcal 23 valent vaccine  0.5 mL Intramuscular Tomorrow-1000  . sodium chloride  3 mL Intravenous Q12H      . sodium chloride  75 mL/hr at 10/03/12 0732  . heparin 1,000 Units/hr (10/03/12 0732)    ASSESSMENT AND PLAN: Principal Problem:  Active Problems:  SVT (supraventricular tachycardia) - possibly atrial tach, BB started yesterday, no dose change with borderline BP SVT broke with long pause.  Would have EP see post cath.  Should be ok for cath latter today if stays in SR   *Acute myocardial infarction, subendocardial infarction, initial episode of care - for cath today, s/p CABG '83, pain-free on current Rx   Diabetes mellitus without complication - Follow CBGs, on SSI   HTN (hypertension) - hold losartan and follow on BB.   SignedTheodore Demark , PA-C 8:10 AM 10/03/2012

## 2012-10-03 NOTE — Progress Notes (Signed)
   Interventional/Cath lab note:  Pt was on the schedule for cardiac cath today. He has continued to have runs of SVT/atrial tachycardia today. He is to be seen by EP this afternoon. Current HR is 125-130, narrow complex. He is asymptomatic. Will cancel cath today to gain better control of HR. Plan cath this week when heart rate controlled. Pt and wife agree. Will allow him to eat.   Darrow Barreiro 1:15 PM 10/03/2012

## 2012-10-04 ENCOUNTER — Inpatient Hospital Stay (HOSPITAL_COMMUNITY): Payer: Medicare Other

## 2012-10-04 ENCOUNTER — Encounter (HOSPITAL_COMMUNITY)
Admission: EM | Disposition: A | Discharge status: Home / Self Care | Payer: Self-pay | Source: Home / Self Care | Attending: Cardiology

## 2012-10-04 DIAGNOSIS — I214 Non-ST elevation (NSTEMI) myocardial infarction: Secondary | ICD-10-CM

## 2012-10-04 DIAGNOSIS — I25810 Atherosclerosis of coronary artery bypass graft(s) without angina pectoris: Secondary | ICD-10-CM

## 2012-10-04 HISTORY — PX: LEFT HEART CATHETERIZATION WITH CORONARY/GRAFT ANGIOGRAM: SHX5450

## 2012-10-04 LAB — POCT ACTIVATED CLOTTING TIME: Activated Clotting Time: 209 seconds

## 2012-10-04 LAB — GLUCOSE, CAPILLARY
Glucose-Capillary: 100 mg/dL — ABNORMAL HIGH (ref 70–99)
Glucose-Capillary: 212 mg/dL — ABNORMAL HIGH (ref 70–99)
Glucose-Capillary: 186 mg/dL — ABNORMAL HIGH (ref 70–99)

## 2012-10-04 LAB — CBC
HCT: 34.2 % — ABNORMAL LOW (ref 39.0–52.0)
Hemoglobin: 11.8 g/dL — ABNORMAL LOW (ref 13.0–17.0)
WBC: 6.7 10*3/uL (ref 4.0–10.5)

## 2012-10-04 LAB — HEPARIN LEVEL (UNFRACTIONATED): Heparin Unfractionated: 0.22 IU/mL — ABNORMAL LOW (ref 0.30–0.70)

## 2012-10-04 SURGERY — LEFT HEART CATHETERIZATION WITH CORONARY/GRAFT ANGIOGRAM
Anesthesia: LOCAL

## 2012-10-04 MED ORDER — HEPARIN (PORCINE) IN NACL 100-0.45 UNIT/ML-% IJ SOLN
1200.0000 [IU]/h | INTRAMUSCULAR | Status: DC
  Filled 2012-10-04: qty 250

## 2012-10-04 MED ORDER — ONDANSETRON HCL 4 MG/2ML IJ SOLN: 4.0000 mg | Freq: Four times a day (QID) | INTRAMUSCULAR | Status: DC | PRN

## 2012-10-04 MED ORDER — HEPARIN (PORCINE) IN NACL 2-0.9 UNIT/ML-% IJ SOLN
INTRAMUSCULAR | Status: AC
  Filled 2012-10-04: qty 1000

## 2012-10-04 MED ORDER — BIVALIRUDIN 250 MG IV SOLR
INTRAVENOUS | Status: AC
  Filled 2012-10-04: qty 250

## 2012-10-04 MED ORDER — ACETAMINOPHEN 325 MG PO TABS: 650.0000 mg | ORAL_TABLET | ORAL | Status: DC | PRN

## 2012-10-04 MED ORDER — SODIUM CHLORIDE 0.9 % IV SOLN: 1.0000 mL/kg/h | INTRAVENOUS | Status: AC

## 2012-10-04 MED ORDER — SODIUM CHLORIDE 0.9 % IJ SOLN
3.0000 mL | Freq: Two times a day (BID) | INTRAMUSCULAR | Status: DC
  Administered 2012-10-04 – 2012-10-05 (×3): 3 mL via INTRAVENOUS

## 2012-10-04 MED ORDER — SODIUM CHLORIDE 0.9 % IJ SOLN: 3.0000 mL | INTRAMUSCULAR | Status: DC | PRN

## 2012-10-04 MED ORDER — FENTANYL CITRATE 0.05 MG/ML IJ SOLN
INTRAMUSCULAR | Status: AC
  Filled 2012-10-04: qty 2

## 2012-10-04 MED ORDER — MIDAZOLAM HCL 2 MG/2ML IJ SOLN
INTRAMUSCULAR | Status: AC
  Filled 2012-10-04: qty 2

## 2012-10-04 MED ORDER — VERAPAMIL HCL 2.5 MG/ML IV SOLN
INTRAVENOUS | Status: AC
  Filled 2012-10-04: qty 2

## 2012-10-04 MED ORDER — NITROGLYCERIN 0.2 MG/ML ON CALL CATH LAB
INTRAVENOUS | Status: AC
  Filled 2012-10-04: qty 1

## 2012-10-04 MED ORDER — SODIUM CHLORIDE 0.9 % IV SOLN
250.0000 mL | INTRAVENOUS | Status: DC | PRN
Start: 2012-10-04 — End: 2012-10-07

## 2012-10-04 MED ORDER — LIDOCAINE HCL (PF) 1 % IJ SOLN
INTRAMUSCULAR | Status: AC
  Filled 2012-10-04: qty 30

## 2012-10-04 NOTE — CV Procedure (Signed)
Cardiac Catheterization Procedure Note  Name: Franklin Gonzalez MRN: [id number] DOB: Mar 10, 1922  Procedure: Left Heart Cath, Selective Coronary Angiography, saphenous vein graft angiography, LIMA angiography,  Stent of saphenous vein graft to OM, stenting of the saphenous vein graft to PDA, Perclose of the right femoral artery  Indication: Non-ST elevation infarction. 79 year old gentleman presenting with recent onset exertional angina and then with resting symptoms. He ruled in for myocardial infarction. He's had some problems with atrial tachycardia has been started on amiodarone. He is referred for cardiac catheterization   Diagnostic Procedure Details: The right groin was prepped, draped, and anesthetized with 1% lidocaine. Using the modified Seldinger technique, a 5 French sheath was introduced into the right femoral artery. Standard Judkins catheters were used for selective coronary angiography, SVG angiography, and LIMA angiography. Catheter exchanges were performed over a wire.  The diagnostic procedure was well-tolerated without immediate complications.  PROCEDURAL FINDINGS Hemodynamics: AO 163/75 LV 161/19  Coronary angiography: Coronary dominance: right  Left mainstem: total occlusion  Left anterior descending (LAD): fills from LIMA. Total occlusion of prox LAD. Mid vessel patent with nonobstructive disease  Left circumflex (LCx): OM branch fills from graft. Prox LCx total occlusion.  LIMA to LAD: Widely patent  SVG to OM: The proximal graft has 40% stenosis. The mid body of the graft has a 95% eccentric stenosis. The distal anastomotic site is patent.  SVG to diagonal: Widely patent throughout with minor irregularity  SVG to RCA: Patent graft with severe 99% stenosis in the distal body the graft with intraluminal filling defect or thrombus.  Right coronary artery (RCA): Small but dominant diffusely diseased vessel. 95% mid vessel stenosis noted. There is diffuse 50-60%  stenosis of the entire RCA. The distal branch vessels of the RCA fill competitively from the graft flow.  Left ventriculography: Deferred.  PCI Procedure Note:  Following the diagnostic procedure, the decision was made to proceed with PCI of the to severe saphenous vein graft lesions. Both vein grafts a critical stenosis and I could not determine which was the culprit. The sheath was upsized to a 6 [place]. Weight-based bivalirudin was given for anticoagulation. Once a therapeutic ACT was achieved, a 6 [place] JR 4 guide catheter was inserted.  Attention was first turned to the saphenous vein graft to OM. A cougar guidewire was advanced across the lesion. A 4 mm spider distal embolic protection device was utilized. The graft was primarily stented with a 3.5 x 16 mm Promus premier drug-eluting stent. The stent was deployed at 14 atmospheres there was an excellent angiographic result. The distal protection device was retrieved and attention was then turned to the saphenous vein graft to PDA. The lesion in the vein graft was too distal to use distal embolic protection. The lesion was wired with the same cougar wire. The lesion was stented with a 3.5 x 16 mm Promus premier drug-eluting stent deployed at 16 atmospheres.  Following PCI, there was 0% residual stenosis and TIMI-3 flow. Final angiography confirmed an excellent result. Femoral hemostasis was achieved with a Perclose device.  The patient tolerated the PCI procedure well. There were no immediate procedural complications.  The patient was transferred to the post catheterization recovery area for further monitoring.  PCI Data: Lesion 1 Vessel - OM 1/Segment - saphenous vein graft mid body Percent Stenosis (pre)  95 TIMI-flow 3 Stent 3.5 x 16 mm drug-eluting Percent Stenosis (post) 0 TIMI-flow (post) 3  Lesion 2 Vessel - PDA/Segment - saphenous vein graft distal body Percent Stenosis (pre)  99 TIMI-flow 3 Stent 3.5 x 16 mm drug-eluting Percent  Stenosis (post) 0 TIMI-flow (post) 3  Final Conclusions:   1. Severe native coronary artery disease with total occlusion left main and severe stenosis of the RCA 2. Status post aortocoronary bypass surgery with continued patency of the saphenous vein graft to diagonal and LIMA to LAD 3. Severe stenoses of the saphenous vein graft to OM and saphenous vein graft to PDA, both successfully treated with drug-eluting stents  Recommendations: Dual antiplatelet therapy with aspirin and Plavix for a minimum of 12 months and consider long-term in the setting of his multiple vein graft disease.  Tonny Bollman 10/04/2012, 1:11 PM

## 2012-10-04 NOTE — H&P (View-Only) (Signed)
Patient Name: Franklin Gonzalez Date of Encounter: 10/04/2012  Principal Problem:  *Acute myocardial infarction, subendocardial infarction, initial episode of care Active Problems:  SVT (supraventricular tachycardia)  Diabetes mellitus without complication  HTN (hypertension)    SUBJECTIVE: No chest pain, no SOB, no palpitations or dizziness (never aware his heart is skipping, racing or out of rhythm).  OBJECTIVE Filed Vitals:   10/04/12 0500 10/04/12 0600 10/04/12 0700 10/04/12 0800  BP: 139/51 153/82 140/71 158/91  Pulse:   89   Temp:   99.1 F (37.3 C)   TempSrc:   Oral   Resp:   16   Height:      Weight:  171 lb 1.2 oz (77.6 kg)    SpO2:   100%     Intake/Output Summary (Last 24 hours) at 10/04/12 0905 Last data filed at 10/04/12 0800  Gross per 24 hour  Intake 1145.75 ml  Output    950 ml  Net 195.75 ml   Filed Weights   10/02/12 1610 10/03/12 0500 10/04/12 0600  Weight: 171 lb 11.8 oz (77.9 kg) 169 lb 5 oz (76.8 kg) 171 lb 1.2 oz (77.6 kg)    PHYSICAL EXAM General: Well developed, well nourished, elderly male in no acute distress. Head: Normocephalic, atraumatic.  Neck: Supple without bruits, JVD not elevated. Lungs:  Resp regular and unlabored, few rales. Heart: IRRR, S1, S2, no S3, S4, or murmur. Abdomen: Soft, non-tender, non-distended, BS + x 4.  Extremities: No clubbing, cyanosis, no edema. Pulses intact Neuro: Alert and oriented X 3. Moves all extremities spontaneously. Psych: Normal affect.  LABS: CBC: Basename 10/04/12 0535 10/03/12 0435  WBC 6.7 8.0  NEUTROABS -- --  HGB 11.8* 12.7*  HCT 34.2* 37.2*  MCV 90.2 90.7  PLT 124* 127*   INR: Basename 10/02/12 1619  INR 1.06   Basic Metabolic Panel: Basename 10/03/12 0435 10/02/12 1619 10/02/12 1050  NA 139 -- 137  K 4.1 -- 4.4  CL 103 -- 99  CO2 25 -- 29  GLUCOSE 134* -- 119*  BUN 20 -- 21  CREATININE 1.19 -- 1.23  CALCIUM 9.1 -- 9.6  MG -- 2.1 --  PHOS -- -- --   Liver Function  Tests: Basename 10/02/12 1050  AST 36  ALT 15  ALKPHOS 76  BILITOT 0.6  PROT 6.3  ALBUMIN 3.6   Cardiac Enzymes: Basename 10/03/12 0435 10/02/12 2230 10/02/12 1619  CKTOTAL -- -- --  CKMB -- -- --  CKMBINDEX -- -- --  TROPONINI 2.03* 1.22* 1.69*    Basename 10/02/12 1058  TROPIPOC 1.34*   Hemoglobin A1C: Basename 10/03/12 0435  HGBA1C 7.0*   Fasting Lipid Panel: Basename 10/03/12 0435  CHOL 126  HDL 64  LDLCALC 54  TRIG 40  CHOLHDL 2.0  LDLDIRECT --   Thyroid Function Tests: Basename 10/02/12 1619  TSH 1.677  T4TOTAL --  T3FREE --  THYROIDAB --   Urinalysis    Component Value Date/Time   COLORURINE YELLOW 10/03/2012 1151   APPEARANCEUR CLEAR 10/03/2012 1151   LABSPEC 1.022 10/03/2012 1151   PHURINE 6.0 10/03/2012 1151   GLUCOSEU NEGATIVE 10/03/2012 1151   HGBUR MODERATE* 10/03/2012 1151   BILIRUBINUR SMALL* 10/03/2012 1151   KETONESUR 40* 10/03/2012 1151   PROTEINUR NEGATIVE 10/03/2012 1151   UROBILINOGEN 1.0 10/03/2012 1151   NITRITE NEGATIVE 10/03/2012 1151   LEUKOCYTESUR NEGATIVE 10/03/2012 1151    TELE:  SR, frequent PACs, multiple P wave morphologies, last prolonged SVT was yesterday afternoon  Radiology/Studies: Dg Chest 2 View 10/02/2012  *RADIOLOGY REPORT*  Clinical Data: Chest pain.  CHEST - 2 VIEW  Comparison: 06/24/2010.  Findings: The cardiac silhouette, mediastinal and hilar contours are within normal limits.  There are surgical changes from bypass surgery.  The lungs demonstrate mild hyperinflation but no infiltrates, edema or effusions.  No pneumothorax.  Minimal scarring changes.  Bilateral nipple shadows are noted.  The bony thorax is intact.  IMPRESSION: No acute cardiopulmonary findings.   Original Report Authenticated By: Rudie Meyer, M.D.     Current Medications:     . amiodarone  200 mg Oral BID  . aspirin EC  81 mg Oral Daily  . atorvastatin  10 mg Oral QHS  . clopidogrel  75 mg Oral Q breakfast  . doxazosin  4 mg Oral QHS  .  influenza  inactive virus vaccine  0.5 mL Intramuscular Tomorrow-1000  . insulin aspart  0-9 Units Subcutaneous TID WC  . losartan  50 mg Oral Daily  . metoprolol tartrate  25 mg Oral BID  . pneumococcal 23 valent vaccine  0.5 mL Intramuscular Tomorrow-1000  . sodium chloride  3 mL Intravenous Q12H      . sodium chloride 75 mL/hr at 10/04/12 6578  . heparin      ASSESSMENT AND PLAN: Principal Problem:  *Acute myocardial infarction, subendocardial infarction, initial episode of care - for cath today  Active Problems:  SVT (supraventricular tachycardia) - seen by EP yesterday, now on amio and higher dose BB, no significant bradycardia, episodes SVT greatly decreased infrequency and duration overnight. Cont to follow.    Diabetes mellitus without complication - metformin held, on SSI   HTN (hypertension) - home HCTZ held, BB added, still needs better control but will hold off on med changes till after cath.   Low-grade temp (100.2) - WBCs OK, U/A OK, CXR OK 1/27 but has some rales, will recheck CXR, add incentive spirometry, no cough or other symptoms, continue PRN Rx, ABX per MD.  Plan - cath today, cont to follow HR.  Signed, Theodore Demark , PA-C 9:05 AM 10/04/2012  Low grade temp CXR ok and urine ok  If rhythm stable on amiodarone cath latter today Repeat CXR And use IS.  Charlton Haws

## 2012-10-04 NOTE — Progress Notes (Signed)
ANTICOAGULATION CONSULT NOTE - Follow-Up Consult  Pharmacy Consult for heparin Indication: chest pain/ACS  No Known Allergies  Patient Measurements: Height: 5\' 11"  (180.3 cm) Weight: 171 lb 1.2 oz (77.6 kg) IBW/kg (Calculated) : 75.3  Heparin Dosing Weight: 78.2kg  Vital Signs: Temp: 99.1 F (37.3 C) (01/29 0700) Temp src: Oral (01/29 0700) BP: 158/91 mmHg (01/29 0800) Pulse Rate: 89  (01/29 0700)  Labs:  Basename 10/04/12 0535 10/03/12 0435 10/02/12 2230 10/02/12 2010 10/02/12 1619 10/02/12 1050  HGB 11.8* 12.7* -- -- -- --  HCT 34.2* 37.2* -- -- -- 38.7*  PLT 124* 127* -- -- -- 140*  APTT -- -- -- -- -- --  LABPROT -- -- -- -- 13.7 --  INR -- -- -- -- 1.06 --  HEPARINUNFRC 0.22* 0.40 -- 0.41 -- --  CREATININE -- 1.19 -- -- -- 1.23  CKTOTAL -- -- -- -- -- --  CKMB -- -- -- -- -- --  TROPONINI -- 2.03* 1.22* -- 1.69* --    Estimated Creatinine Clearance: 45.7 ml/min (by C-G formula based on Cr of 1.19).  Assessment: Chest pain/acs, heparin level low this morning. No bleeding issues and IV infusing appropriately per nursing. Cath cancelled yesterday, will likely be able to take today.  Goal of Therapy:  Heparin level 0.3-0.7 units/ml Monitor platelets by anticoagulation protocol: Yes   Plan:  1. Increase Heparin gtt to 1200 units/hr 2. Daily heparin level and CBC 3.Follow up after cath  Sheppard Coil, PharmD, BCPS Clinical pharmacist, pager [phone] 10/04/2012,8:38 AM

## 2012-10-04 NOTE — Progress Notes (Signed)
 Patient Name: Franklin Gonzalez Date of Encounter: 10/04/2012  Principal Problem:  *Acute myocardial infarction, subendocardial infarction, initial episode of care Active Problems:  SVT (supraventricular tachycardia)  Diabetes mellitus without complication  HTN (hypertension)    SUBJECTIVE: No chest pain, no SOB, no palpitations or dizziness (never aware his heart is skipping, racing or out of rhythm).  OBJECTIVE Filed Vitals:   10/04/12 0500 10/04/12 0600 10/04/12 0700 10/04/12 0800  BP: 139/51 153/82 140/71 158/91  Pulse:   89   Temp:   99.1 F (37.3 C)   TempSrc:   Oral   Resp:   16   Height:      Weight:  171 lb 1.2 oz (77.6 kg)    SpO2:   100%     Intake/Output Summary (Last 24 hours) at 10/04/12 0905 Last data filed at 10/04/12 0800  Gross per 24 hour  Intake 1145.75 ml  Output    950 ml  Net 195.75 ml   Filed Weights   10/02/12 1610 10/03/12 0500 10/04/12 0600  Weight: 171 lb 11.8 oz (77.9 kg) 169 lb 5 oz (76.8 kg) 171 lb 1.2 oz (77.6 kg)    PHYSICAL EXAM General: Well developed, well nourished, elderly male in no acute distress. Head: Normocephalic, atraumatic.  Neck: Supple without bruits, JVD not elevated. Lungs:  Resp regular and unlabored, few rales. Heart: IRRR, S1, S2, no S3, S4, or murmur. Abdomen: Soft, non-tender, non-distended, BS + x 4.  Extremities: No clubbing, cyanosis, no edema. Pulses intact Neuro: Alert and oriented X 3. Moves all extremities spontaneously. Psych: Normal affect.  LABS: CBC: Basename 10/04/12 0535 10/03/12 0435  WBC 6.7 8.0  NEUTROABS -- --  HGB 11.8* 12.7*  HCT 34.2* 37.2*  MCV 90.2 90.7  PLT 124* 127*   INR: Basename 10/02/12 1619  INR 1.06   Basic Metabolic Panel: Basename 10/03/12 0435 10/02/12 1619 10/02/12 1050  NA 139 -- 137  K 4.1 -- 4.4  CL 103 -- 99  CO2 25 -- 29  GLUCOSE 134* -- 119*  BUN 20 -- 21  CREATININE 1.19 -- 1.23  CALCIUM 9.1 -- 9.6  MG -- 2.1 --  PHOS -- -- --   Liver Function  Tests: Basename 10/02/12 1050  AST 36  ALT 15  ALKPHOS 76  BILITOT 0.6  PROT 6.3  ALBUMIN 3.6   Cardiac Enzymes: Basename 10/03/12 0435 10/02/12 2230 10/02/12 1619  CKTOTAL -- -- --  CKMB -- -- --  CKMBINDEX -- -- --  TROPONINI 2.03* 1.22* 1.69*    Basename 10/02/12 1058  TROPIPOC 1.34*   Hemoglobin A1C: Basename 10/03/12 0435  HGBA1C 7.0*   Fasting Lipid Panel: Basename 10/03/12 0435  CHOL 126  HDL 64  LDLCALC 54  TRIG 40  CHOLHDL 2.0  LDLDIRECT --   Thyroid Function Tests: Basename 10/02/12 1619  TSH 1.677  T4TOTAL --  T3FREE --  THYROIDAB --   Urinalysis    Component Value Date/Time   COLORURINE YELLOW 10/03/2012 1151   APPEARANCEUR CLEAR 10/03/2012 1151   LABSPEC 1.022 10/03/2012 1151   PHURINE 6.0 10/03/2012 1151   GLUCOSEU NEGATIVE 10/03/2012 1151   HGBUR MODERATE* 10/03/2012 1151   BILIRUBINUR SMALL* 10/03/2012 1151   KETONESUR 40* 10/03/2012 1151   PROTEINUR NEGATIVE 10/03/2012 1151   UROBILINOGEN 1.0 10/03/2012 1151   NITRITE NEGATIVE 10/03/2012 1151   LEUKOCYTESUR NEGATIVE 10/03/2012 1151    TELE:  SR, frequent PACs, multiple P wave morphologies, last prolonged SVT was yesterday afternoon     Radiology/Studies: Dg Chest 2 View 10/02/2012  *RADIOLOGY REPORT*  Clinical Data: Chest pain.  CHEST - 2 VIEW  Comparison: 06/24/2010.  Findings: The cardiac silhouette, mediastinal and hilar contours are within normal limits.  There are surgical changes from bypass surgery.  The lungs demonstrate mild hyperinflation but no infiltrates, edema or effusions.  No pneumothorax.  Minimal scarring changes.  Bilateral nipple shadows are noted.  The bony thorax is intact.  IMPRESSION: No acute cardiopulmonary findings.   Original Report Authenticated By: P. Gallerani, M.D.     Current Medications:     . amiodarone  200 mg Oral BID  . aspirin EC  81 mg Oral Daily  . atorvastatin  10 mg Oral QHS  . clopidogrel  75 mg Oral Q breakfast  . doxazosin  4 mg Oral QHS  .  influenza  inactive virus vaccine  0.5 mL Intramuscular Tomorrow-1000  . insulin aspart  0-9 Units Subcutaneous TID WC  . losartan  50 mg Oral Daily  . metoprolol tartrate  25 mg Oral BID  . pneumococcal 23 valent vaccine  0.5 mL Intramuscular Tomorrow-1000  . sodium chloride  3 mL Intravenous Q12H      . sodium chloride 75 mL/hr at 10/04/12 0625  . heparin      ASSESSMENT AND PLAN: Principal Problem:  *Acute myocardial infarction, subendocardial infarction, initial episode of care - for cath today  Active Problems:  SVT (supraventricular tachycardia) - seen by EP yesterday, now on amio and higher dose BB, no significant bradycardia, episodes SVT greatly decreased infrequency and duration overnight. Cont to follow.    Diabetes mellitus without complication - metformin held, on SSI   HTN (hypertension) - home HCTZ held, BB added, still needs better control but will hold off on med changes till after cath.   Low-grade temp (100.2) - WBCs OK, U/A OK, CXR OK 1/27 but has some rales, will recheck CXR, add incentive spirometry, no cough or other symptoms, continue PRN Rx, ABX per MD.  Plan - cath today, cont to follow HR.  Signed, Rhonda Barrett , PA-C 9:05 AM 10/04/2012  Low grade temp CXR ok and urine ok  If rhythm stable on amiodarone cath latter today Repeat CXR And use IS.  Peter Nishan  

## 2012-10-04 NOTE — Interval H&P Note (Signed)
History and Physical Interval Note:  10/04/2012 12:05 PM  Franklin Gonzalez  has presented today for surgery, with the diagnosis of chest pain  The various methods of treatment have been discussed with the patient and family. After consideration of risks, benefits and other options for treatment, the patient has consented to  Procedure(s) (LRB) with comments: LEFT HEART CATHETERIZATION WITH CORONARY/GRAFT ANGIOGRAM (N/A) as a surgical intervention .  The patient's history has been reviewed, patient examined, no change in status, stable for surgery.  I have reviewed the patient's chart and labs.  Questions were answered to the patient's satisfaction.     Tonny Bollman

## 2012-10-05 LAB — CBC
MCH: 30.4 pg (ref 26.0–34.0)
MCV: 89.9 fL (ref 78.0–100.0)
Platelets: 122 10*3/uL — ABNORMAL LOW (ref 150–400)
RDW: 12.3 % (ref 11.5–15.5)

## 2012-10-05 LAB — BASIC METABOLIC PANEL
Calcium: 8.6 mg/dL (ref 8.4–10.5)
Creatinine, Ser: 1.12 mg/dL (ref 0.50–1.35)
GFR calc Af Amer: 66 mL/min — ABNORMAL LOW (ref >90)
GFR calc non Af Amer: 57 mL/min — ABNORMAL LOW (ref >90)
Sodium: 140 mEq/L (ref 135–145)

## 2012-10-05 LAB — GLUCOSE, CAPILLARY
Glucose-Capillary: 238 mg/dL — ABNORMAL HIGH (ref 70–99)
Glucose-Capillary: 158 mg/dL — ABNORMAL HIGH (ref 70–99)
Glucose-Capillary: 137 mg/dL — ABNORMAL HIGH (ref 70–99)

## 2012-10-05 MED ORDER — AZITHROMYCIN 500 MG PO TABS
500.0000 mg | ORAL_TABLET | Freq: Every day | ORAL | Status: AC
  Administered 2012-10-05: 500 mg via ORAL
  Filled 2012-10-05: qty 1

## 2012-10-05 MED ORDER — AZITHROMYCIN 250 MG PO TABS
250.0000 mg | ORAL_TABLET | Freq: Every day | ORAL | Status: DC
  Administered 2012-10-06 – 2012-10-07 (×2): 250 mg via ORAL
  Filled 2012-10-05 (×2): qty 1

## 2012-10-05 MED FILL — Dextrose Inj 5%: INTRAVENOUS | Qty: 50 | Status: AC

## 2012-10-05 NOTE — Progress Notes (Signed)
Patient Name: Franklin Gonzalez Date of Encounter: 10/05/2012    SUBJECTIVE: No chest pain, no SOB, no palpitations or dizziness; feels congested; no productive cough, dysuria or diarrhea.   OBJECTIVE Filed Vitals:   10/05/12 0300 10/05/12 0400 10/05/12 0600 10/05/12 0700  BP: 118/45 132/97 173/73 151/60  Pulse: 61 57 88 75  Temp:  99 F (37.2 C)    TempSrc:  Oral    Resp: 22 17    Height:      Weight:      SpO2: 92% 94% 96% 94%    Intake/Output Summary (Last 24 hours) at 10/05/12 0753 Last data filed at 10/04/12 2200  Gross per 24 hour  Intake    624 ml  Output    600 ml  Net     24 ml   Filed Weights   10/02/12 1610 10/03/12 0500 10/04/12 0600  Weight: 171 lb 11.8 oz (77.9 kg) 169 lb 5 oz (76.8 kg) 171 lb 1.2 oz (77.6 kg)    PHYSICAL EXAM General: Well developed, well nourished, elderly male in no acute distress. Head: Normocephalic, atraumatic.  Neck: Supple  Lungs:  Resp regular and unlabored, few rales. Heart: RRR Abdomen: Soft, non-tender, non-distended; right groin with no hematoma and no bruit. Extremities: No edema. IV site without evidence of infection Neuro: Alert and oriented X 3. Moves all extremities spontaneously.   LABS: CBC:  Basename 10/05/12 0500 10/04/12 0535  WBC 6.3 6.7  NEUTROABS -- --  HGB 12.3* 11.8*  HCT 36.3* 34.2*  MCV 89.9 90.2  PLT 122* 124*   INR:  Basename 10/02/12 1619  INR 1.06   Basic Metabolic Panel:  Basename 10/05/12 0500 10/03/12 0435 10/02/12 1619  NA 140 139 --  K 3.7 4.1 --  CL 106 103 --  CO2 23 25 --  GLUCOSE 117* 134* --  BUN 17 20 --  CREATININE 1.12 1.19 --  CALCIUM 8.6 9.1 --  MG -- -- 2.1  PHOS -- -- --   Liver Function Tests:  Basename 10/02/12 1050  AST 36  ALT 15  ALKPHOS 76  BILITOT 0.6  PROT 6.3  ALBUMIN 3.6   Cardiac Enzymes:  Basename 10/03/12 0435 10/02/12 2230 10/02/12 1619  CKTOTAL -- -- --  CKMB -- -- --  CKMBINDEX -- -- --  TROPONINI 2.03* 1.22* 1.69*    Basename  10/02/12 1058  TROPIPOC 1.34*   Hemoglobin A1C:  Basename 10/03/12 0435  HGBA1C 7.0*   Fasting Lipid Panel:  Basename 10/03/12 0435  CHOL 126  HDL 64  LDLCALC 54  TRIG 40  CHOLHDL 2.0  LDLDIRECT --   Thyroid Function Tests:  Basename 10/02/12 1619  TSH 1.677  T4TOTAL --  T3FREE --  THYROIDAB --   Urinalysis    Component Value Date/Time   COLORURINE YELLOW 10/03/2012 1151   APPEARANCEUR CLEAR 10/03/2012 1151   LABSPEC 1.022 10/03/2012 1151   PHURINE 6.0 10/03/2012 1151   GLUCOSEU NEGATIVE 10/03/2012 1151   HGBUR MODERATE* 10/03/2012 1151   BILIRUBINUR SMALL* 10/03/2012 1151   KETONESUR 40* 10/03/2012 1151   PROTEINUR NEGATIVE 10/03/2012 1151   UROBILINOGEN 1.0 10/03/2012 1151   NITRITE NEGATIVE 10/03/2012 1151   LEUKOCYTESUR NEGATIVE 10/03/2012 1151    TELE:  SR, frequent PACs  Radiology/Studies: Dg Chest 2 View 10/02/2012  *RADIOLOGY REPORT*  Clinical Data: Chest pain.  CHEST - 2 VIEW  Comparison: 06/24/2010.  Findings: The cardiac silhouette, mediastinal and hilar contours are within normal limits.  There are surgical  changes from bypass surgery.  The lungs demonstrate mild hyperinflation but no infiltrates, edema or effusions.  No pneumothorax.  Minimal scarring changes.  Bilateral nipple shadows are noted.  The bony thorax is intact.  IMPRESSION: No acute cardiopulmonary findings.   Original Report Authenticated By: Rudie Meyer, M.D.     Current Medications:     . amiodarone  200 mg Oral BID  . aspirin EC  81 mg Oral Daily  . atorvastatin  10 mg Oral QHS  . clopidogrel  75 mg Oral Q breakfast  . doxazosin  4 mg Oral QHS  . insulin aspart  0-9 Units Subcutaneous TID WC  . losartan  50 mg Oral Daily  . metoprolol tartrate  25 mg Oral BID  . sodium chloride  3 mL Intravenous Q12H  . sodium chloride  3 mL Intravenous Q12H      ASSESSMENT AND PLAN: Principal Problem:  *Acute myocardial infarction, subendocardial infarction; s/p PCI; continue ASA, plavix and  statin.  Active Problems:  SVT (supraventricular tachycardia) - seen by EP previously, continue amiodarone and beta blocker, no significant bradycardia, episodes SVT greatly decreased infrequency and duration overnight. Cont to follow.    Diabetes mellitus without complication - metformin held, on SSI   HTN (hypertension) - home HCTZ held, BB added  Fever- started prior to cath; ? Viral (patient describes congestion; no productive cough or dysuria); WBC normal, U/A OK, CXR edema yesterday. Check urine and blood cultures; add zpack.   Signed, Olga Millers , MD 7:53 AM 10/05/2012

## 2012-10-05 NOTE — Progress Notes (Signed)
CARDIAC REHAB PHASE I   PRE:  Rate/Rhythm: 94 SR with PACs    BP: sitting 168/88    SaO2: 94 RA  MODE:  Ambulation: 350 ft   POST:  Rate/Rhythm: 118 ? afib vs SR with many PACs    BP: sitting 176/87     SaO2: 95 RA  Pt sts he is anxious to get out of bed. Hr increased with increased PACs on EOB. Elevated BP this am. Pt sts he feels fine. Slightly unsteady ambulating. HR increased to 118, very irregular, P waves unseen. Upon sitting, HR slowed and P's visible every few beats. Pt denies any problems however not sure he tells the truth. Does not offer much in conversation. Somewhat SOB walking. Did not want to rest. To recliner. Will f/u with education with family present.  [phone]  Harriet Masson CES, ACSM

## 2012-10-06 LAB — GLUCOSE, CAPILLARY

## 2012-10-06 MED ORDER — FAMOTIDINE 20 MG PO TABS
20.0000 mg | ORAL_TABLET | Freq: Two times a day (BID) | ORAL | Status: DC
  Administered 2012-10-06 – 2012-10-07 (×2): 20 mg via ORAL
  Filled 2012-10-06 (×6): qty 1

## 2012-10-06 NOTE — Progress Notes (Signed)
Mr Roupp converted to SVT in the 140's MD called and is aware continue would like to continue with the transfer. PRN lopressor given and patient has now converted to NRS with numerous PAC's in the 90's. BP stable and patient has no complaints. Will continue to monitor.  Elbie Statzer, Charlaine Dalton

## 2012-10-06 NOTE — Progress Notes (Signed)
CARDIAC REHAB PHASE I   PRE:  Rate/Rhythm: 93Afib  BP:  Supine:   Sitting: 115/79  Standing:    SaO2:   MODE:  Ambulation: 520 ft   POST:  Rate/Rhythm: 86Afib  BP:  Supine:   Sitting: 134/64  Standing:    SaO2:  [id number] Pt ambulated well with assistance x1. Before entering, pt had SVT in the 140s, but medication was given and rhythm went back to normal for pt. Educated pt and son on MI, stent, risk factors, restrictions, exercise, diet, and CRP II. Pt gave permission to contact [facility] about CRP II. Returned pt to recliner with son in the room.   Deetta Perla

## 2012-10-06 NOTE — Progress Notes (Signed)
Patient Name: Franklin Gonzalez Date of Encounter: 10/06/2012    SUBJECTIVE: No chest pain, no SOB, no palpitations or dizziness; feels congested; no productive cough, dysuria or diarrhea.   OBJECTIVE Filed Vitals:   10/05/12 2202 10/05/12 2325 10/06/12 0325 10/06/12 0500  BP: 130/82 108/52 116/68   Pulse: 94     Temp: 100 F (37.8 C) 99.7 F (37.6 C) 98.9 F (37.2 C)   TempSrc: Oral Oral Oral   Resp:      Height:      Weight:    171 lb 15.3 oz (78 kg)  SpO2:  91% 92%     Intake/Output Summary (Last 24 hours) at 10/06/12 0736 Last data filed at 10/06/12 0600  Gross per 24 hour  Intake    723 ml  Output    400 ml  Net    323 ml   Filed Weights   10/03/12 0500 10/04/12 0600 10/06/12 0500  Weight: 169 lb 5 oz (76.8 kg) 171 lb 1.2 oz (77.6 kg) 171 lb 15.3 oz (78 kg)    PHYSICAL EXAM General: Well developed, well nourished, elderly male in no acute distress. Head: Normocephalic, atraumatic.  Neck: Supple  Lungs:  CTA Heart: RRR Abdomen: Soft, non-tender, non-distended; right groin with no hematoma and no bruit. Extremities: No edema. IV site without evidence of infection Neuro: Alert and oriented X 3. Moves all extremities spontaneously.   LABS: CBC:  Basename 10/05/12 0500 10/04/12 0535  WBC 6.3 6.7  NEUTROABS -- --  HGB 12.3* 11.8*  HCT 36.3* 34.2*  MCV 89.9 90.2  PLT 122* 124*   Basic Metabolic Panel:  Basename 10/05/12 0500  NA 140  K 3.7  CL 106  CO2 23  GLUCOSE 117*  BUN 17  CREATININE 1.12  CALCIUM 8.6  MG --  PHOS --   Urinalysis    Component Value Date/Time   COLORURINE YELLOW 10/03/2012 1151   APPEARANCEUR CLEAR 10/03/2012 1151   LABSPEC 1.022 10/03/2012 1151   PHURINE 6.0 10/03/2012 1151   GLUCOSEU NEGATIVE 10/03/2012 1151   HGBUR MODERATE* 10/03/2012 1151   BILIRUBINUR SMALL* 10/03/2012 1151   KETONESUR 40* 10/03/2012 1151   PROTEINUR NEGATIVE 10/03/2012 1151   UROBILINOGEN 1.0 10/03/2012 1151   NITRITE NEGATIVE 10/03/2012 1151   LEUKOCYTESUR NEGATIVE 10/03/2012 1151    TELE:  SR, frequent PACs  Radiology/Studies: Dg Chest 2 View 10/02/2012  *RADIOLOGY REPORT*  Clinical Data: Chest pain.  CHEST - 2 VIEW  Comparison: 06/24/2010.  Findings: The cardiac silhouette, mediastinal and hilar contours are within normal limits.  There are surgical changes from bypass surgery.  The lungs demonstrate mild hyperinflation but no infiltrates, edema or effusions.  No pneumothorax.  Minimal scarring changes.  Bilateral nipple shadows are noted.  The bony thorax is intact.  IMPRESSION: No acute cardiopulmonary findings.   Original Report Authenticated By: Rudie Meyer, M.D.     Current Medications:     . amiodarone  200 mg Oral BID  . aspirin EC  81 mg Oral Daily  . atorvastatin  10 mg Oral QHS  . azithromycin  250 mg Oral Daily  . clopidogrel  75 mg Oral Q breakfast  . doxazosin  4 mg Oral QHS  . insulin aspart  0-9 Units Subcutaneous TID WC  . losartan  50 mg Oral Daily  . metoprolol tartrate  25 mg Oral BID  . sodium chloride  3 mL Intravenous Q12H  . sodium chloride  3 mL Intravenous Q12H  ASSESSMENT AND PLAN: Principal Problem:  *Acute myocardial infarction, subendocardial infarction; s/p PCI; continue ASA, plavix and statin.  Active Problems:  SVT (supraventricular tachycardia) - seen by EP previously, continue amiodarone and beta blocker, no significant bradycardia, episodes SVT greatly decreased infrequency and durationt. Cont to follow. Consider ablation if fails medical therapy   Diabetes mellitus without complication - metformin held; resume at DC; on SSI   HTN (hypertension) - continue present meds.  Fever- started prior to cath; some improvement overnight; ? Viral (patient describes congestion; no productive cough or dysuria); WBC normal yesterday, U/A OK, CXR edema yesterday. Urine and blood cultures pending; continue zpack.  Transfer to telemetry; ambulate; if fever resolves and cultures neg, DC in AM  or Sunday and follow up with me 2-4 weeks Signed, Olga Millers , MD 7:36 AM 10/06/2012

## 2012-10-06 NOTE — Progress Notes (Deleted)
CARDIAC REHAB PHASE I   PRE:  Rate/Rhythm: 93 SR  BP:  Supine: 107/78  Sitting:   Standing:    SaO2: 95 RA  MODE:  Ambulation: 350 ft   POST:  Rate/Rhythem: 97 Afib  BP:  Supine:   Sitting: 114/78  Standing:    SaO2: 97 RA 0935-1005 Assisted X 1 to ambulate. Gait steady VS stable. Pt tolerated ambulation well without c/o of SOB. Unable to get him to go any further due to it being cold in hallway. Reviewed CHF with pt and fluid restrictions and encouraged daily walking. He voices understanding.   Beatrix Fetters

## 2012-10-06 NOTE — Progress Notes (Signed)
Report called to nurse on 2000. Transported via nurse. Will continue to monitor.  Marcy Bogosian, Charlaine Dalton RN

## 2012-10-07 ENCOUNTER — Encounter (HOSPITAL_COMMUNITY): Payer: Self-pay | Admitting: Cardiology

## 2012-10-07 DIAGNOSIS — R509 Fever, unspecified: Secondary | ICD-10-CM

## 2012-10-07 DIAGNOSIS — D649 Anemia, unspecified: Secondary | ICD-10-CM

## 2012-10-07 DIAGNOSIS — I498 Other specified cardiac arrhythmias: Secondary | ICD-10-CM

## 2012-10-07 LAB — BASIC METABOLIC PANEL
CO2: 23 mEq/L (ref 19–32)
Calcium: 8.3 mg/dL — ABNORMAL LOW (ref 8.4–10.5)
Chloride: 107 mEq/L (ref 96–112)
GFR calc Af Amer: 61 mL/min — ABNORMAL LOW (ref >90)
Sodium: 139 mEq/L (ref 135–145)

## 2012-10-07 LAB — CBC
HCT: 32.1 % — ABNORMAL LOW (ref 39.0–52.0)
MCV: 90.2 fL (ref 78.0–100.0)
Platelets: 132 10*3/uL — ABNORMAL LOW (ref 150–400)
RBC: 3.56 MIL/uL — ABNORMAL LOW (ref 4.22–5.81)
WBC: 4.9 10*3/uL (ref 4.0–10.5)

## 2012-10-07 LAB — GLUCOSE, CAPILLARY
Glucose-Capillary: 119 mg/dL — ABNORMAL HIGH (ref 70–99)
Glucose-Capillary: 128 mg/dL — ABNORMAL HIGH (ref 70–99)

## 2012-10-07 MED ORDER — METOPROLOL TARTRATE 25 MG PO TABS: 25.0000 mg | ORAL_TABLET | Freq: Two times a day (BID) | ORAL | Status: DC

## 2012-10-07 MED ORDER — CLOPIDOGREL BISULFATE 75 MG PO TABS: 75.0000 mg | ORAL_TABLET | Freq: Every day | ORAL | Status: DC

## 2012-10-07 MED ORDER — FAMOTIDINE 20 MG PO TABS: 20.0000 mg | ORAL_TABLET | Freq: Two times a day (BID) | ORAL | Status: DC

## 2012-10-07 MED ORDER — METFORMIN HCL 500 MG PO TABS
500.0000 mg | ORAL_TABLET | Freq: Two times a day (BID) | ORAL | Status: DC
  Filled 2012-10-07 (×3): qty 1

## 2012-10-07 MED ORDER — ADENOSINE 6 MG/2ML IV SOLN
6.0000 mg | Freq: Once | INTRAVENOUS | Status: AC
  Administered 2012-10-07: 6 mg via INTRAVENOUS
  Filled 2012-10-07 (×2): qty 2

## 2012-10-07 MED ORDER — AMIODARONE HCL 200 MG PO TABS: 200.0000 mg | ORAL_TABLET | Freq: Two times a day (BID) | ORAL | Status: DC

## 2012-10-07 MED ORDER — LOSARTAN POTASSIUM 50 MG PO TABS: 50.0000 mg | ORAL_TABLET | Freq: Every day | ORAL | Status: DC

## 2012-10-07 MED ORDER — ASPIRIN 81 MG PO TBEC: 81.0000 mg | DELAYED_RELEASE_TABLET | Freq: Every day | ORAL | Status: DC

## 2012-10-07 MED ORDER — NITROGLYCERIN 0.4 MG SL SUBL: 0.4000 mg | SUBLINGUAL_TABLET | SUBLINGUAL | Status: DC | PRN

## 2012-10-07 NOTE — Consult Note (Addendum)
Franklin Gonzalez  79 y.o.  male  Subjective: Pleasant, mentally sharp older gentleman reporting minimal nonproductive cough without sputum. No fever or chills. No urinary symptoms.  SVT at a rate of 140 recurred approximately one hour ago. Patient denies symptoms. No diagnosis of SVT prior to this admission.  Allergy: Review of patient's allergies indicates no known allergies.  Objective: Vital signs in last 24 hours: Temp:  [98.5 F (36.9 C)-99.4 F (37.4 C)] 99.4 F (37.4 C) (02/01 0356) Pulse Rate:  [68-72] 72  (02/01 0356) Resp:  [16-19] 19  (02/01 0356) BP: (115-148)/(54-82) 140/54 mmHg (02/01 0356) SpO2:  [93 %-98 %] 95 % (02/01 0356) Weight:  [78.472 kg (173 lb)] 78.472 kg (173 lb) (02/01 0356)  78.472 kg (173 lb) Body mass index is 24.13 kg/(m^2).  Weight change: 0.472 kg (1 lb 0.7 oz) Last BM Date: 10/06/12  Intake/Output from previous day: 01/31 0701 - 02/01 0700 In: 600 [P.O.:600] Out: 200 [Urine:200] General- Well developed; no acute distress  Neck- No JVD, no carotid bruits Lungs- clear lung fields; normal I:E ratio Cardiovascular- normal PMI; normal S1 and S2; rapid irregular rhythm; minimal systolic ejection murmur Abdomen- normal bowel sounds; soft and non-tender without masses or organomegaly Skin- Warm, no significant lesions Extremities- Nl distal pulses; no edema  CBC:   Basename 10/07/12 0630 10/05/12 0500  WBC 4.9 6.3  HGB 11.0* 12.3*  HCT 32.1* 36.3*  PLT 132* 122*   BMET:  Basename 10/07/12 0630 10/05/12 0500  NA 139 140  K 3.5 3.7  CL 107 106  CO2 23 23  GLUCOSE 125* 117*  BUN 22 17  CREATININE 1.19 1.12  CALCIUM 8.3* 8.6   Imaging: Imaging results have been reviewed  Medications:  I have reviewed the patient's current medications. Scheduled:   . amiodarone  200 mg Oral BID  . aspirin EC  81 mg Oral Daily  . atorvastatin  10 mg Oral QHS  . azithromycin  250 mg Oral Daily  . clopidogrel  75 mg Oral Q breakfast  . doxazosin  4 mg  Oral QHS  . famotidine  20 mg Oral BID  . insulin aspart  0-9 Units Subcutaneous TID WC  . losartan  50 mg Oral Daily  . metoprolol tartrate  25 mg Oral BID   EKG:  10/05/12  normal sinus rhythm with PACs, left anterior fascicular block, right bundle branch block, prolonged QT  Chemical Cardioversion: Adenosine 6 mg administered intravenously converting SVT to sinus rhythm.   Assessment/Plan: ASCVD: Stable following intervention on appropriate medication.  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 Patient has received only a minimal dose of amiodarone so for. Since he is asymptomatic, and arrhythmia apparently is paroxysmal, we'll plan discharge today. Discussed with Dr. Johney Frame who recommends continuing current medication and followup with Dr. Jens Som and with him as needed.  Hypertension: Adequately controlled.  Fasting hyperglycemia/diabetes: Patient was receiving minimal medical therapy, which is probably not necessary. We will discharge off of metformin to determine if treatment is really necessary.  Fever: Appears to have resolved; no significant temperature elevation for the past 48 hours.  30 minutes spent in evaluation and treatment.  LOS: 5 days   [place] 10/07/2012, 7:54 AM

## 2012-10-07 NOTE — Discharge Summary (Signed)
CARDIOLOGY DISCHARGE SUMMARY    Patient ID: Franklin Gonzalez,  MRN: [id number], DOB/AGE: [birth year] 79 y.o.  Admit date: 10/02/2012 Discharge date: 10/07/2012  Primary Care Physician: Wyvonnia Lora, MD Primary Cardiologist: Olga Millers, MD  Primary Discharge Diagnosis:  1. NSTEMI/CAD  Stable following intervention - DES to SVG-OM1, DES to SVG-PDA. On appropriate medication regimen.  2. PSVT Likely AV nodal reentrant rhythm. Patient has received only a minimal dose of amiodarone so far. Since he is asymptomatic and arrhythmia apparently is paroxysmal, we'll plan discharge today. Discussed with Dr. Johney Frame who recommends continuing current medication and followup with Dr. Jens Som and with him as needed.  3. Hypertension  Adequately controlled.  5. Fasting hyperglycemia/diabetes Patient was receiving minimal medical therapy, which is probably not necessary. We will discharge off of metformin to determine if treatment is really necessary.  6. Fever Resolved. No significant temperature elevation for the past 48 hours.   Secondary Discharge Diagnoses:  1. s/p CABG 1993 2. HTN 3. DM  Procedures This Admission:  1. Tranthoracic echo 10/03/2012 - Left ventricle: Poor endocardial definition Inferior wall appears hypokinetic The cavity size was mildly dilated. Wall thickness was normal. Systolic function was mildly reduced. The estimated ejection fraction was in the range of 45% to 50%. - Aortic valve: Mildly calcified leaflets. - Mitral valve: Mildly thickened leaflets . Doppler: Trivial regurgitation. - Left atrium: The atrium was normal in size. - Atrial septum: Poorly visualized. - Right ventricle: The cavity size was normal. Wall thickness was normal. Systolic function was normal. - Pulmonic valve: Doppler: Trivial regurgitation. - Tricuspid valve: Doppler: Mild regurgitation. - Right atrium: The atrium was normal in size. - Pericardium: The pericardium was normal in  appearance.  2. Cardiac catheterization 10/04/2012 Hemodynamics:  AO 163/75  LV 161/19  Coronary angiography:  Coronary dominance: right  Left mainstem: total occlusion  Left anterior descending (LAD): fills from LIMA. Total occlusion of prox LAD. Mid vessel patent with nonobstructive disease  Left circumflex (LCx): OM branch fills from graft. Prox LCx total occlusion.  LIMA to LAD: Widely patent  SVG to OM: The proximal graft has 40% stenosis. The mid body of the graft has a 95% eccentric stenosis. The distal anastomotic site is patent.  SVG to diagonal: Widely patent throughout with minor irregularity  SVG to RCA: Patent graft with severe 99% stenosis in the distal body the graft with intraluminal filling defect or thrombus.  Right coronary artery (RCA): Small but dominant diffusely diseased vessel. 95% mid vessel stenosis noted. There is diffuse 50-60% stenosis of the entire RCA. The distal branch vessels of the RCA fill competitively from the graft flow.  Left ventriculography: Deferred.  PCI Procedure Note: Following the diagnostic procedure, the decision was made to proceed with PCI of the to severe saphenous vein graft lesions. Both vein grafts a critical stenosis and I could not determine which was the culprit. The sheath was upsized to a 6 [place]. Weight-based bivalirudin was given for anticoagulation. Once a therapeutic ACT was achieved, a 6 [place] JR 4 guide catheter was inserted. Attention was first turned to the saphenous vein graft to OM. A cougar guidewire was advanced across the lesion. A 4 mm spider distal embolic protection device was utilized. The graft was primarily stented with a 3.5 x 16 mm Promus premier drug-eluting stent. The stent was deployed at 14 atmospheres there was an excellent angiographic result. The distal protection device was retrieved and attention was then turned to the saphenous vein graft to PDA. The  lesion in the vein graft was too distal to use distal  embolic protection. The lesion was wired with the same cougar wire. The lesion was stented with a 3.5 x 16 mm Promus premier drug-eluting stent deployed at 16 atmospheres. Following PCI, there was 0% residual stenosis and TIMI-3 flow. Final angiography confirmed an excellent result. Femoral hemostasis was achieved with a Perclose device. The patient tolerated the PCI procedure well. There were no immediate procedural complications. The patient was transferred to the post catheterization recovery area for further monitoring.  PCI Data:  Lesion 1  Vessel - OM 1/Segment - saphenous vein graft mid body  Percent Stenosis (pre) 95  TIMI-flow 3  Stent 3.5 x 16 mm drug-eluting  Percent Stenosis (post) 0  TIMI-flow (post) 3  Lesion 2  Vessel - PDA/Segment - saphenous vein graft distal body  Percent Stenosis (pre) 99  TIMI-flow 3  Stent 3.5 x 16 mm drug-eluting  Percent Stenosis (post) 0  TIMI-flow (post) 3  Final Conclusions:  1. Severe native coronary artery disease with total occlusion left main and severe stenosis of the RCA  2. Status post aortocoronary bypass surgery with continued patency of the saphenous vein graft to diagonal and LIMA to LAD  3. Severe stenoses of the saphenous vein graft to OM and saphenous vein graft to PDA, both successfully treated with drug-eluting stents  Recommendations:  Dual antiplatelet therapy with aspirin and Plavix for a minimum of 12 months and consider long-term in the setting of his multiple vein graft disease.  History and Hospital Course:  Franklin Gonzalez is an 79 year old gentleman with CAD s/p CABG in 1993 who presented to [facility] with 4-day history of intermittent L>R sided chest pain described as an "uncomfortable ache," worse with exertion, typically relieved by rest. On the day prior to admission he walked 6/10 of a mile to church and developed CP. This eventually eased off. No SOB, diaphoresis, dizziness, nausea, or vomiting. Up until a week  ago, he was walking 2 miles per day without complication. He had persistent chest discomfort on the day of admission and told his son who brought him straight to [facility] ED. His 12-lead ECG showed SR, frequent PACs, RBBB, LAFB and nonspecific ST-T wave abnormalities. He ruled in for NSTEMI with an initial troponin of 1.34. An echo was done which revealed mildly reduced LV function, EF 45-50% with inferior wall akinesis. There were no significant valvular abnormalities. He underwent cardiac catheterization on 10/04/2012 which revealed severe native CAD and 2/4 grafts patent (LIMA to LAD and SVG to diagonal were patent). He then underwent PCI with a DES to SVG-OM1 and DES to SVG-PDA. Please see details as outlined above. Mr. Bitting tolerated this procedure well without any immediate complication. He remains hemodynamically stable. His groin site is intact without significant bleeding or hematoma. He was evaluated by cardiac rehab and has been ambulating without difficulty. While here, he did have frequent runs of SVT which was mostly asymptomatic. However, given the frequency, he was seen in consultation by Dr. Johney Frame who recommended increasing metoprolol and adding amiodarone to his medication regimen. His SVT responded well to this medication adjustment and he has had fewer recurrences. Of note, he developed fever following his cardiac cath. Etiology unclear but his urine culture was negative. His blood culture is negative so far but the final report will be issued 02/054/2014. His fever resolved and he has been afebrile for the last 48 hours. This AM, he has been seen,  examined and deemed stable for discharge today by Dr. [place] Bing. He has been given discharge instructions including wound care and activity restrictions. He will follow-up with Dr. Jens Som in clinic in 2-3 weeks.   Discharge Vitals: Blood pressure 133/57, pulse 71, temperature 99.4 F (37.4 C), temperature source Oral, resp. rate 19, height  5\' 11"  (1.803 m), weight 173 lb (78.472 kg), SpO2 95.00%.   Labs: Lab Results  Component Value Date   WBC 4.9 10/07/2012   HGB 11.0* 10/07/2012   HCT 32.1* 10/07/2012   MCV 90.2 10/07/2012   PLT 132* 10/07/2012     Lab 10/07/12 0630 10/02/12 1050  NA 139 --  K 3.5 --  CL 107 --  CO2 23 --  BUN 22 --  CREATININE 1.19 --  CALCIUM 8.3* --  PROT -- 6.3  BILITOT -- 0.6  ALKPHOS -- 76  ALT -- 15  AST -- 36  GLUCOSE 125* --   Lab Results  Component Value Date   TROPONINI 2.03* 10/03/2012    Lab Results  Component Value Date   CHOL 126 10/03/2012   Lab Results  Component Value Date   HDL 64 10/03/2012   Lab Results  Component Value Date   LDLCALC 54 10/03/2012   Lab Results  Component Value Date   TRIG 40 10/03/2012   Lab Results  Component Value Date   CHOLHDL 2.0 10/03/2012    Disposition:  The patient is being discharged in stable condition.  Follow-up:     Follow-up Information    Follow up with Olga Millers, MD. On 10/27/2012. (At 9:30 AM)    Contact information:   [address] [place] [address] [phone]       Discharge Medications:    Medication List     As of 10/07/2012  1:14 PM    STOP taking these medications         losartan-hydrochlorothiazide 50-12.5 MG per tablet   Commonly known as: HYZAAR      metFORMIN 500 MG 24 hr tablet   Commonly known as: GLUCOPHAGE-XR      TAKE these medications         amiodarone 200 MG tablet   Commonly known as: PACERONE   Take 1 tablet (200 mg total) by mouth 2 (two) times daily.      aspirin 81 MG EC tablet   Take 1 tablet (81 mg total) by mouth daily.      atorvastatin 10 MG tablet   Commonly known as: LIPITOR   Take 10 mg by mouth at bedtime.      clopidogrel 75 MG tablet   Commonly known as: PLAVIX   Take 1 tablet (75 mg total) by mouth daily with breakfast.      doxazosin 4 MG tablet   Commonly known as: CARDURA   Take 4 mg by mouth at bedtime.      famotidine 20 MG  tablet   Commonly known as: PEPCID   Take 1 tablet (20 mg total) by mouth 2 (two) times daily.      losartan 50 MG tablet   Commonly known as: COZAAR   Take 1 tablet (50 mg total) by mouth daily.      metoprolol tartrate 25 MG tablet   Commonly known as: LOPRESSOR   Take 1 tablet (25 mg total) by mouth 2 (two) times daily.      nitroGLYCERIN 0.4 MG SL tablet   Commonly known as: NITROSTAT  Place 1 tablet (0.4 mg total) under the tongue every 5 (five) minutes x 3 doses as needed for chest pain.       Duration of Discharge Encounter: Greater than 30 minutes including physician time.  Signed, Rick Duff, PA-C 10/07/2012, 1:14 PM

## 2012-10-07 NOTE — Progress Notes (Signed)
CARDIAC REHAB PHASE I   PRE:  Rate/Rhythm: 76 Afib  BP:  Supine: 122/70 Sitting:   Standing:    SaO2: 95% RA  MODE:  Ambulation: 550 ft   POST:  Rate/Rhythm: 93  BP:  Supine:   Sitting: 152/80  Standing:    SaO2: 98% RA  1224-1240 Pt tolerated ambulation well with assist x1, tired after walk, VSS.  Annetta Maw

## 2012-10-10 ENCOUNTER — Telehealth: Payer: Self-pay | Admitting: *Deleted

## 2012-10-10 NOTE — Telephone Encounter (Signed)
TCM patient. Called patient. Has all medications and not having any problems. He is aware of his post hospital appointment with Dr.Crenshaw on 2/21.

## 2012-10-11 LAB — CULTURE, BLOOD (ROUTINE X 2): Culture: NO GROWTH

## 2012-10-12 ENCOUNTER — Telehealth: Payer: Self-pay | Admitting: Cardiology

## 2012-10-12 NOTE — Telephone Encounter (Signed)
New problem   Patient son calling   Has question regarding medication  Metforim.

## 2012-10-12 NOTE — Telephone Encounter (Signed)
Spoke with pt, questions regarding discharge meds answered.

## 2012-10-26 ENCOUNTER — Encounter: Payer: Self-pay | Admitting: Cardiology

## 2012-10-26 ENCOUNTER — Encounter: Payer: Self-pay | Admitting: *Deleted

## 2012-10-27 ENCOUNTER — Encounter: Payer: Self-pay | Admitting: Cardiology

## 2012-10-27 ENCOUNTER — Ambulatory Visit (INDEPENDENT_AMBULATORY_CARE_PROVIDER_SITE_OTHER): Payer: Medicare Other | Admitting: Cardiology

## 2012-10-27 VITALS — BP 159/69 | HR 50 | Wt 169.0 lb

## 2012-10-27 DIAGNOSIS — I2510 Atherosclerotic heart disease of native coronary artery without angina pectoris: Secondary | ICD-10-CM | POA: Insufficient documentation

## 2012-10-27 DIAGNOSIS — I498 Other specified cardiac arrhythmias: Secondary | ICD-10-CM

## 2012-10-27 DIAGNOSIS — R0989 Other specified symptoms and signs involving the circulatory and respiratory systems: Secondary | ICD-10-CM | POA: Insufficient documentation

## 2012-10-27 DIAGNOSIS — I10 Essential (primary) hypertension: Secondary | ICD-10-CM

## 2012-10-27 DIAGNOSIS — E785 Hyperlipidemia, unspecified: Secondary | ICD-10-CM | POA: Insufficient documentation

## 2012-10-27 MED ORDER — AMIODARONE HCL 200 MG PO TABS: 200.0000 mg | ORAL_TABLET | Freq: Every day | ORAL | Status: DC

## 2012-10-27 NOTE — Assessment & Plan Note (Signed)
Patient in sinus rhythm on examination. Continued amiodarone but decrease to 200 mg daily. Continue beta blocker. In 6 weeks we will check TSH, liver functions and chest x-ray.

## 2012-10-27 NOTE — Assessment & Plan Note (Signed)
Continue present medications. Check potassium and renal function in 6 weeks.

## 2012-10-27 NOTE — Assessment & Plan Note (Signed)
Plan continue aspirin, Plavix and statin.

## 2012-10-27 NOTE — Assessment & Plan Note (Signed)
Continue statin. Check lipids and liver in 6 weeks.

## 2012-10-27 NOTE — Assessment & Plan Note (Signed)
Schedule carotid Dopplers. 

## 2012-10-27 NOTE — Patient Instructions (Signed)
Your physician wants you to follow-up in: 6 MONTHS WITH DR Jens Som You will receive a reminder letter in the mail two months in advance. If you don't receive a letter, please call our office to schedule the follow-up appointment.   DECREASE AMIODARONE TO 200 MG ONCE DAILY  Your physician recommends that you return for lab work in: 6 WEEKS=FASTING=IN [place]  A chest x-ray takes a picture of the organs and structures inside the chest, including the heart, lungs, and blood vessels. This test can show several things, including, whether the heart is enlarges; whether fluid is building up in the lungs; and whether pacemaker / defibrillator leads are still in place. IN 6 WEEKS  Your physician has requested that you have a carotid duplex. This test is an ultrasound of the carotid arteries in your neck. It looks at blood flow through these arteries that supply the brain with blood. Allow one hour for this exam. There are no restrictions or special instructions. SCHEDULE IN [place]

## 2012-10-27 NOTE — Progress Notes (Signed)
HPI: 79 year old male for followup of coronary artery disease. Patient is status post coronary artery bypass graft at [facility] in 1993. He was admitted to [facility] in January of 2014 with chest pain. He ruled in for a non-ST elevation myocardial infarction. Cardiac catheterization in January of 2014 showed an occluded left main, LAD, circumflex. There was a 95% RCA. LIMA to the LAD was patent. Saphenous vein graft to the acute marginal had a 95% lesion. Saphenous vein graft to the diagonal was patent. Saphenous vein graft to the RCA had a 99% stenosis. Patient had PCI of the saphenous vein graft to the right coronary artery and saphenous vein graft to the obtuse marginal with drug-eluting stents. Echocardiogram in January 2014 showed an ejection fraction of 45-50%. The patient also had SVT during hospitalization. He was seen by electrophysiology and placed on amiodarone. Since discharge, the patient denies any dyspnea on exertion, orthopnea, PND, pedal edema, palpitations, syncope or chest pain.   Current Outpatient Prescriptions  Medication Sig Dispense Refill  . amiodarone (PACERONE) 200 MG tablet Take 1 tablet (200 mg total) by mouth 2 (two) times daily.  60 tablet  3  . aspirin EC 81 MG EC tablet Take 1 tablet (81 mg total) by mouth daily.      Marland Kitchen atorvastatin (LIPITOR) 10 MG tablet Take 10 mg by mouth at bedtime.      . clopidogrel (PLAVIX) 75 MG tablet Take 1 tablet (75 mg total) by mouth daily with breakfast.  30 tablet  11  . doxazosin (CARDURA) 4 MG tablet Take 4 mg by mouth at bedtime.      . famotidine (PEPCID) 20 MG tablet Take 1 tablet (20 mg total) by mouth 2 (two) times daily.  60 tablet  3  . losartan (COZAAR) 50 MG tablet Take 1 tablet (50 mg total) by mouth daily.  30 tablet  3  . metoprolol tartrate (LOPRESSOR) 25 MG tablet Take 1 tablet (25 mg total) by mouth 2 (two) times daily.  60 tablet  11  . nitroGLYCERIN (NITROSTAT) 0.4 MG SL tablet Place 1 tablet (0.4 mg  total) under the tongue every 5 (five) minutes x 3 doses as needed for chest pain.  60 tablet  3   No current facility-administered medications for this visit.     Past Medical History  Diagnosis Date  . Coronary artery disease     CABG 1993 at [facility]; NSTEMI 09/2012 s/p DES to SVG-OM1, DES to SVG-PDA   . DM (diabetes mellitus)   . HTN (hypertension)   . Bifascicular block     Past Surgical History  Procedure Laterality Date  . Cardiac surgery      1993 CABG    History   Social History  . Marital Status: Married    Spouse Name: N/A    Number of Children: N/A  . Years of Education: N/A   Occupational History  . Not on file.   Social History Main Topics  . Smoking status: Former Games developer  . Smokeless tobacco: Not on file  . Alcohol Use: No  . Drug Use: No  . Sexually Active:    Other Topics Concern  . Not on file   Social History Narrative  . No narrative on file    ROS: no fevers or chills, productive cough, hemoptysis, dysphasia, odynophagia, melena, hematochezia, dysuria, hematuria, rash, seizure activity, orthopnea, PND, pedal edema, claudication. Remaining systems are negative.  Physical Exam: Well-developed well-nourished in no acute distress.  Skin is warm and dry.  HEENT is normal.  Neck is supple. Right carotid bruit Chest is clear to auscultation with normal expansion.  Cardiovascular exam is regular rate and rhythm.  Abdominal exam nontender or distended. No masses palpated. Extremities show no edema. neuro grossly intact

## 2012-11-15 ENCOUNTER — Encounter (INDEPENDENT_AMBULATORY_CARE_PROVIDER_SITE_OTHER): Payer: Medicare Other

## 2012-11-15 ENCOUNTER — Telehealth: Payer: Self-pay | Admitting: Cardiology

## 2012-11-15 DIAGNOSIS — R0989 Other specified symptoms and signs involving the circulatory and respiratory systems: Secondary | ICD-10-CM

## 2012-11-15 DIAGNOSIS — I6529 Occlusion and stenosis of unspecified carotid artery: Secondary | ICD-10-CM

## 2012-11-15 NOTE — Telephone Encounter (Signed)
Spoke with pt son, aware cxr is for amiodarone surveillance. He will just need to have the cxr done within the next 2-4 weeks. The pts son will call if another order needed.

## 2012-11-15 NOTE — Telephone Encounter (Signed)
Pt was to get chest x-ray and carotid and they did carotid but not chest x-ray due to machine being down and they need to know what to do from here

## 2012-11-22 ENCOUNTER — Telehealth: Payer: Self-pay | Admitting: Cardiology

## 2012-11-22 NOTE — Telephone Encounter (Signed)
Spoke with pt wife, aware of carotid results

## 2012-11-22 NOTE — Telephone Encounter (Signed)
New Problem:    Patient called in returning your call. Please call back. 

## 2012-11-24 ENCOUNTER — Other Ambulatory Visit: Payer: Self-pay | Admitting: *Deleted

## 2012-12-18 ENCOUNTER — Encounter: Payer: Self-pay | Admitting: Cardiology

## 2013-01-04 ENCOUNTER — Other Ambulatory Visit: Payer: Self-pay | Admitting: *Deleted

## 2013-01-04 DIAGNOSIS — I498 Other specified cardiac arrhythmias: Secondary | ICD-10-CM

## 2013-01-05 ENCOUNTER — Telehealth: Payer: Self-pay | Admitting: Cardiology

## 2013-01-05 NOTE — Telephone Encounter (Signed)
Spoke with [name], Aware of dr Ludwig Clarks recommendations.

## 2013-01-05 NOTE — Telephone Encounter (Signed)
New problem    Son returning call back to nurse.

## 2013-01-05 NOTE — Telephone Encounter (Signed)
Spoke with pt son, [name], the pt is having extreme fatigue. He is unable to walk as much as he did prior to the MI. ? If due to some of the new medicine. Will forward for dr Jens Som review

## 2013-01-05 NOTE — Telephone Encounter (Signed)
Change lopressor to 12.5 mg po BID; DC if fatigue persists Olga Millers

## 2013-01-08 ENCOUNTER — Telehealth: Payer: Self-pay | Admitting: Cardiology

## 2013-01-08 NOTE — Telephone Encounter (Signed)
New problem ° ° ° °Pt returning your phone call. Please call pt °

## 2013-01-08 NOTE — Telephone Encounter (Signed)
Spoke with pt, aware of cxr results

## 2013-02-19 ENCOUNTER — Other Ambulatory Visit: Payer: Self-pay | Admitting: Cardiology

## 2013-04-10 ENCOUNTER — Ambulatory Visit (INDEPENDENT_AMBULATORY_CARE_PROVIDER_SITE_OTHER): Payer: Medicare Other | Admitting: Cardiology

## 2013-04-10 ENCOUNTER — Encounter: Payer: Self-pay | Admitting: Cardiology

## 2013-04-10 VITALS — BP 170/80 | HR 45 | Wt 168.0 lb

## 2013-04-10 DIAGNOSIS — I10 Essential (primary) hypertension: Secondary | ICD-10-CM

## 2013-04-10 DIAGNOSIS — I471 Supraventricular tachycardia, unspecified: Secondary | ICD-10-CM

## 2013-04-10 DIAGNOSIS — I679 Cerebrovascular disease, unspecified: Secondary | ICD-10-CM

## 2013-04-10 DIAGNOSIS — I2510 Atherosclerotic heart disease of native coronary artery without angina pectoris: Secondary | ICD-10-CM

## 2013-04-10 DIAGNOSIS — E785 Hyperlipidemia, unspecified: Secondary | ICD-10-CM

## 2013-04-10 DIAGNOSIS — I498 Other specified cardiac arrhythmias: Secondary | ICD-10-CM

## 2013-04-10 MED ORDER — AMLODIPINE BESYLATE 5 MG PO TABS: 5.0000 mg | ORAL_TABLET | Freq: Every day | ORAL | Status: DC

## 2013-04-10 NOTE — Assessment & Plan Note (Signed)
Continue aspirin, Plavix and statin. 

## 2013-04-10 NOTE — Assessment & Plan Note (Signed)
Continue statin. Check lipids and liver. 

## 2013-04-10 NOTE — Assessment & Plan Note (Signed)
Continue aspirin and statin. Schedule followup carotid Dopplers September 2014.

## 2013-04-10 NOTE — Assessment & Plan Note (Signed)
Blood pressure is elevated. We are discontinuing metoprolol because of bradycardia. Increase amlodipine to 5 mg daily. Follow at home and increase medications as needed.

## 2013-04-10 NOTE — Assessment & Plan Note (Signed)
Patient remains in sinus rhythm. He is bradycardic. Discontinue metoprolol. Continue amiodarone. Check TSH, liver functions and chest x-ray.

## 2013-04-10 NOTE — Progress Notes (Signed)
HPI: Pleasant male for followup of coronary artery disease. Patient is status post coronary artery bypass graft at [facility] in 1993. He was admitted to [facility] in January of 2014 with chest pain. He ruled in for a non-ST elevation myocardial infarction. Cardiac catheterization in January of 2014 showed an occluded left main, LAD, circumflex. There was a 95% RCA. LIMA to the LAD was patent. Saphenous vein graft to the acute marginal had a 95% lesion. Saphenous vein graft to the diagonal was patent. Saphenous vein graft to the RCA had a 99% stenosis. Patient had PCI of the saphenous vein graft to the right coronary artery and saphenous vein graft to the obtuse marginal with drug-eluting stents. Echocardiogram in January 2014 showed an ejection fraction of 45-50%. The patient also had SVT during hospitalization. He was seen by electrophysiology and placed on amiodarone. Carotid Dopplers in March of 2014 showed 60-79% right and 40-59% left stenosis. Followup recommended in 6 months. Since last seen in Feb 2014, the patient denies any dyspnea on exertion, orthopnea, PND, palpitations, syncope or chest pain. Occasional mild pedal edema.   Current Outpatient Prescriptions  Medication Sig Dispense Refill  . amiodarone (PACERONE) 200 MG tablet Take 1 tablet (200 mg total) by mouth daily.  60 tablet  3  . aspirin EC 81 MG EC tablet Take 1 tablet (81 mg total) by mouth daily.      Marland Kitchen atorvastatin (LIPITOR) 10 MG tablet Take 10 mg by mouth at bedtime.      . clopidogrel (PLAVIX) 75 MG tablet Take 1 tablet (75 mg total) by mouth daily with breakfast.  30 tablet  11  . doxazosin (CARDURA) 4 MG tablet Take 4 mg by mouth at bedtime.      . famotidine (PEPCID) 20 MG tablet TAKE ONE TABLET BY MOUTH TWICE DAILY  60 tablet  3  . losartan (COZAAR) 50 MG tablet Take 1 tablet (50 mg total) by mouth daily.  30 tablet  3  . metoprolol tartrate (LOPRESSOR) 25 MG tablet Take 12.5 mg by mouth 2 (two) times  daily.      . nitroGLYCERIN (NITROSTAT) 0.4 MG SL tablet Place 1 tablet (0.4 mg total) under the tongue every 5 (five) minutes x 3 doses as needed for chest pain.  60 tablet  3  . amLODipine (NORVASC) 2.5 MG tablet Take 1 tablet by mouth daily.       No current facility-administered medications for this visit.     Past Medical History  Diagnosis Date  . Coronary artery disease     CABG 1993 at [facility]; NSTEMI 09/2012 s/p DES to SVG-OM1, DES to SVG-PDA   . DM (diabetes mellitus)   . HTN (hypertension)   . Bifascicular block   . SVT (supraventricular tachycardia)     Past Surgical History  Procedure Laterality Date  . Cardiac surgery      1993 CABG    History   Social History  . Marital Status: Married    Spouse Name: N/A    Number of Children: N/A  . Years of Education: N/A   Occupational History  . Not on file.   Social History Main Topics  . Smoking status: Former Games developer  . Smokeless tobacco: Not on file  . Alcohol Use: No  . Drug Use: No  . Sexually Active:    Other Topics Concern  . Not on file   Social History Narrative  . No narrative on file  ROS: no fevers or chills, productive cough, hemoptysis, dysphasia, odynophagia, melena, hematochezia, dysuria, hematuria, rash, seizure activity, orthopnea, PND, pedal edema, claudication. Remaining systems are negative.  Physical Exam: Well-developed well-nourished in no acute distress.  Skin is warm and dry.  HEENT is normal.  Neck is supple.  Chest is clear to auscultation with normal expansion.  Cardiovascular exam is bradycardic but regular Abdominal exam nontender or distended. No masses palpated. Extremities show trace edema. neuro grossly intact  ECG sinus bradycardia at a rate of 45. Right bundle branch block. Left anterior fascicular block.

## 2013-04-10 NOTE — Patient Instructions (Addendum)
Your physician wants you to follow-up in:  6 months.  You will receive a reminder letter in the mail two months in advance. If you don't receive a letter, please call our office to schedule the follow-up appointment.   Your physician has requested that you have a carotid duplex. This test is an ultrasound of the carotid arteries in your neck. It looks at blood flow through these arteries that supply the brain with blood. Allow one hour for this exam. There are no restrictions or special instructions. To be done in September in [place] office.  Have chest X-ray done at [facility].  Have Dr. Margo Common send recent lab work to [place].  If not done recently you will need to have TSH, BMP, Liver profile and lipid profile checked.  Your physician has recommended you make the following change in your medication:  Stop metoprolol. Increase amlodipine to 5 mg by mouth daily.

## 2013-04-23 ENCOUNTER — Telehealth: Payer: Self-pay | Admitting: Cardiology

## 2013-04-23 NOTE — Telephone Encounter (Signed)
ROI faxed to [facility] Tapper [phone] 04/23/13/KM

## 2013-05-16 ENCOUNTER — Encounter (INDEPENDENT_AMBULATORY_CARE_PROVIDER_SITE_OTHER): Payer: Medicare Other

## 2013-05-16 DIAGNOSIS — I6529 Occlusion and stenosis of unspecified carotid artery: Secondary | ICD-10-CM

## 2013-05-16 DIAGNOSIS — I679 Cerebrovascular disease, unspecified: Secondary | ICD-10-CM

## 2013-06-22 ENCOUNTER — Encounter: Payer: Self-pay | Admitting: Cardiology

## 2013-06-29 ENCOUNTER — Other Ambulatory Visit: Payer: Self-pay | Admitting: Cardiology

## 2013-08-29 ENCOUNTER — Other Ambulatory Visit: Payer: Self-pay | Admitting: Cardiology

## 2013-09-24 ENCOUNTER — Other Ambulatory Visit: Payer: Self-pay

## 2013-09-24 MED ORDER — CLOPIDOGREL BISULFATE 75 MG PO TABS: 75.0000 mg | ORAL_TABLET | Freq: Every day | ORAL | Status: DC

## 2013-10-11 ENCOUNTER — Encounter: Payer: Self-pay | Admitting: Cardiology

## 2013-10-11 ENCOUNTER — Ambulatory Visit (INDEPENDENT_AMBULATORY_CARE_PROVIDER_SITE_OTHER): Payer: Medicare Other | Admitting: Cardiology

## 2013-10-11 VITALS — BP 142/72 | HR 48 | Ht 71.0 in | Wt 168.0 lb

## 2013-10-11 DIAGNOSIS — E785 Hyperlipidemia, unspecified: Secondary | ICD-10-CM

## 2013-10-11 DIAGNOSIS — I2510 Atherosclerotic heart disease of native coronary artery without angina pectoris: Secondary | ICD-10-CM

## 2013-10-11 DIAGNOSIS — I10 Essential (primary) hypertension: Secondary | ICD-10-CM

## 2013-10-11 DIAGNOSIS — I498 Other specified cardiac arrhythmias: Secondary | ICD-10-CM

## 2013-10-11 DIAGNOSIS — I4891 Unspecified atrial fibrillation: Secondary | ICD-10-CM

## 2013-10-11 DIAGNOSIS — I471 Supraventricular tachycardia: Secondary | ICD-10-CM

## 2013-10-11 LAB — HEPATIC FUNCTION PANEL
ALT: 18 U/L (ref 0–53)
AST: 20 U/L (ref 0–37)
Albumin: 3.4 g/dL — ABNORMAL LOW (ref 3.5–5.2)
Alkaline Phosphatase: 78 U/L (ref 39–117)
BILIRUBIN TOTAL: 0.8 mg/dL (ref 0.3–1.2)
Bilirubin, Direct: 0.1 mg/dL (ref 0.0–0.3)
TOTAL PROTEIN: 5.6 g/dL — AB (ref 6.0–8.3)

## 2013-10-11 LAB — TSH: TSH: 4.11 u[IU]/mL (ref 0.35–5.50)

## 2013-10-11 NOTE — Assessment & Plan Note (Signed)
Continue statin. 

## 2013-10-11 NOTE — Addendum Note (Signed)
Addended by: Freddi StarrMATHIS, Jirah Rider W on: 10/11/2013 09:13 AM   Modules accepted: Orders

## 2013-10-11 NOTE — Assessment & Plan Note (Signed)
Continue aspirin and statin. Followup carotid Dopplers March 2015. 

## 2013-10-11 NOTE — Progress Notes (Signed)
HPI: FU coronary artery disease. Patient is status post coronary artery bypass graft at [facility] in 1993. He was admitted to [facility] in January of 2014 with chest pain. He ruled in for a non-ST elevation myocardial infarction. Cardiac catheterization in January of 2014 showed an occluded left main, LAD, circumflex. There was a 95% RCA. LIMA to the LAD was patent. Saphenous vein graft to the acute marginal had a 95% lesion. Saphenous vein graft to the diagonal was patent. Saphenous vein graft to the RCA had a 99% stenosis. Patient had PCI of the saphenous vein graft to the right coronary artery and saphenous vein graft to the obtuse marginal with drug-eluting stents. Echocardiogram in January 2014 showed an ejection fraction of 45-50%. The patient also had SVT during hospitalization. He was seen by electrophysiology and placed on amiodarone. Carotid Dopplers in Sept of 2014 showed 60-79% left and 40-59% right stenosis. Followup recommended in 6 months. Since last seen in August 2014, the patient denies any dyspnea on exertion, orthopnea, PND, palpitations, syncope or chest pain. Occasional mild pedal edema.   Current Outpatient Prescriptions  Medication Sig Dispense Refill  . amiodarone (PACERONE) 200 MG tablet Take 1 tablet (200 mg total) by mouth daily.  60 tablet  3  . amLODipine (NORVASC) 5 MG tablet Take 1 tablet (5 mg total) by mouth daily.  30 tablet  6  . aspirin EC 81 MG EC tablet Take 1 tablet (81 mg total) by mouth daily.      Marland Kitchen atorvastatin (LIPITOR) 10 MG tablet Take 10 mg by mouth at bedtime.      . clopidogrel (PLAVIX) 75 MG tablet Take 1 tablet (75 mg total) by mouth daily with breakfast.  30 tablet  6  . doxazosin (CARDURA) 4 MG tablet Take 4 mg by mouth at bedtime.      . famotidine (PEPCID) 20 MG tablet TAKE ONE TABLET BY MOUTH TWICE DAILY  60 tablet  6  . losartan (COZAAR) 50 MG tablet Take 1 tablet (50 mg total) by mouth daily.  30 tablet  3  . metFORMIN  (GLUCOPHAGE-XR) 500 MG 24 hr tablet Take 1 tablet by mouth daily.      . nitroGLYCERIN (NITROSTAT) 0.4 MG SL tablet Place 1 tablet (0.4 mg total) under the tongue every 5 (five) minutes x 3 doses as needed for chest pain.  60 tablet  3   No current facility-administered medications for this visit.     Past Medical History  Diagnosis Date  . Coronary artery disease     CABG 1993 at [facility]; NSTEMI 09/2012 s/p DES to SVG-OM1, DES to SVG-PDA   . DM (diabetes mellitus)   . HTN (hypertension)   . Bifascicular block   . SVT (supraventricular tachycardia)     Past Surgical History  Procedure Laterality Date  . Cardiac surgery      1993 CABG    History   Social History  . Marital Status: Married    Spouse Name: N/A    Number of Children: N/A  . Years of Education: N/A   Occupational History  . Not on file.   Social History Main Topics  . Smoking status: Former Games developer  . Smokeless tobacco: Not on file  . Alcohol Use: No  . Drug Use: No  . Sexual Activity:    Other Topics Concern  . Not on file   Social History Narrative  . No narrative on file  ROS: no fevers or chills, productive cough, hemoptysis, dysphasia, odynophagia, melena, hematochezia, dysuria, hematuria, rash, seizure activity, orthopnea, PND, claudication. Remaining systems are negative.  Physical Exam: Well-developed well-nourished in no acute distress.  Skin is warm and dry.  HEENT is normal.  Neck is supple.  Chest is clear to auscultation with normal expansion.  Cardiovascular exam is bradycardic but regular Abdominal exam nontender or distended. No masses palpated. Extremities show 1+ ankle edema. neuro grossly intact  ECG marked sinus bradycardia, left anterior fascicular block, right bundle branch block.

## 2013-10-11 NOTE — Patient Instructions (Signed)
Your physician wants you to follow-up in: 6 MONTHS WITH DR CRENSHAW You will receive a reminder letter in the mail two months in advance. If you don't receive a letter, please call our office to schedule the follow-up appointment.   Your physician recommends that you HAVE LAB WORK TODAY  A chest x-ray takes a picture of the organs and structures inside the chest, including the heart, lungs, and blood vessels. This test can show several things, including, whether the heart is enlarges; whether fluid is building up in the lungs; and whether pacemaker / defibrillator leads are still in place. AT [address] OFFICE 

## 2013-10-11 NOTE — Assessment & Plan Note (Signed)
Continue aspirin, Plavix and statin. 

## 2013-10-11 NOTE — Assessment & Plan Note (Signed)
Patient remains in sinus rhythm. He is mildly bradycardic but is not having symptoms. Continue amiodarone at present dose for history of persistent SVT. Check liver functions, TSH and chest x-ray.

## 2013-10-11 NOTE — Assessment & Plan Note (Signed)
Blood pressure controlled. Continue present medications. 

## 2013-11-08 ENCOUNTER — Other Ambulatory Visit: Payer: Self-pay | Admitting: Cardiology

## 2013-11-23 ENCOUNTER — Other Ambulatory Visit: Payer: Self-pay | Admitting: Cardiology

## 2013-11-27 ENCOUNTER — Ambulatory Visit (HOSPITAL_COMMUNITY)
Admission: RE | Admit: 2013-11-27 | Discharge: 2013-11-27 | Disposition: A | Discharge status: Home / Self Care | Payer: Medicare Other | Source: Ambulatory Visit | Attending: Cardiology | Admitting: Cardiology

## 2013-11-27 DIAGNOSIS — I2510 Atherosclerotic heart disease of native coronary artery without angina pectoris: Secondary | ICD-10-CM

## 2013-11-27 DIAGNOSIS — E785 Hyperlipidemia, unspecified: Secondary | ICD-10-CM

## 2013-11-27 DIAGNOSIS — R000 Tachycardia, unspecified: Secondary | ICD-10-CM | POA: Insufficient documentation

## 2013-11-27 DIAGNOSIS — I471 Supraventricular tachycardia: Secondary | ICD-10-CM

## 2013-11-27 DIAGNOSIS — I4891 Unspecified atrial fibrillation: Secondary | ICD-10-CM

## 2013-11-27 DIAGNOSIS — I10 Essential (primary) hypertension: Secondary | ICD-10-CM

## 2013-11-27 DIAGNOSIS — Z951 Presence of aortocoronary bypass graft: Secondary | ICD-10-CM | POA: Insufficient documentation

## 2013-12-01 IMAGING — CR DG CHEST 2V
2 series · 2 of 2 positions shown · non-contrast
Comparison: 10/02/2012; 06/24/2010

CLINICAL DATA: Fever, cough

CHEST - 2 VIEW

[w chest pa]
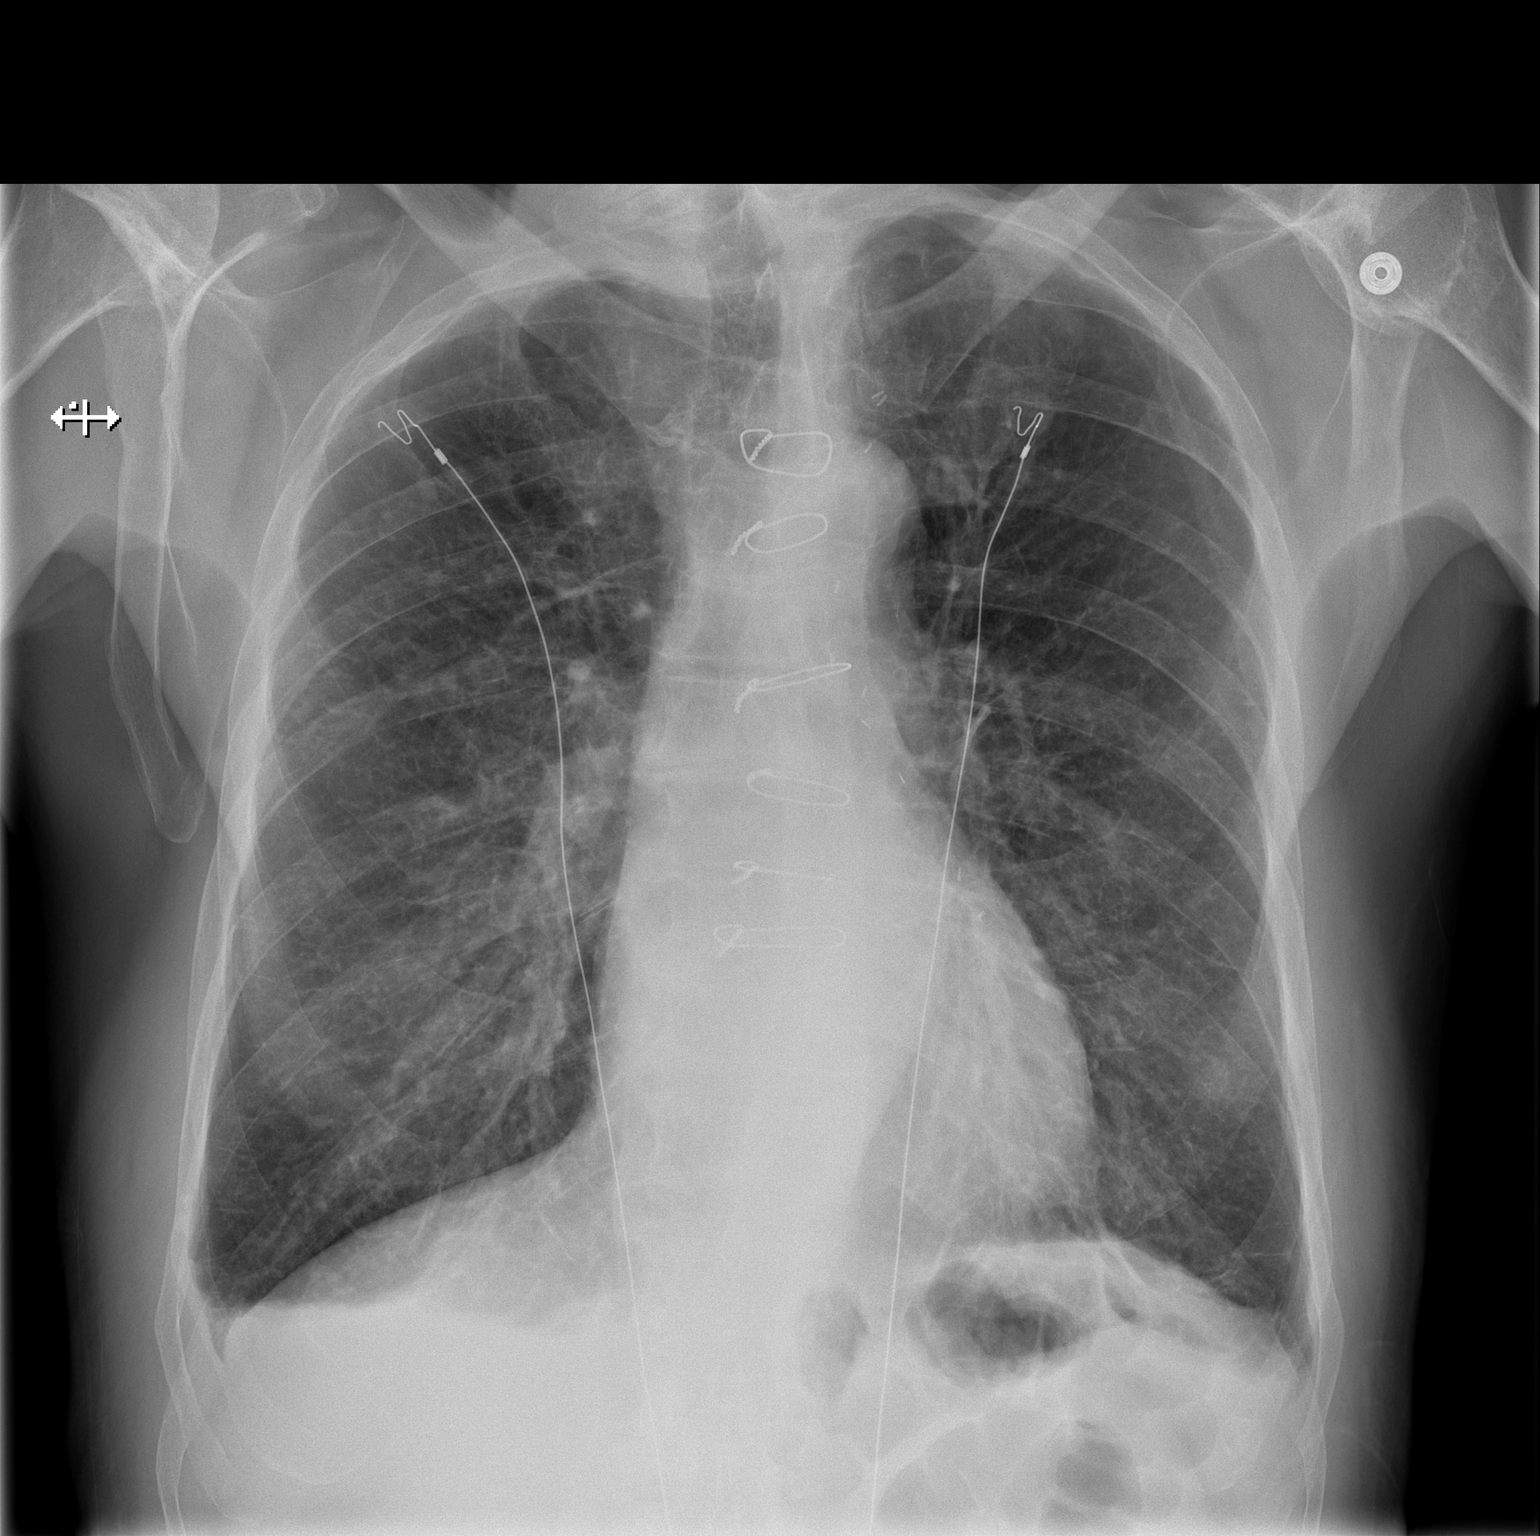

[w chest lat]
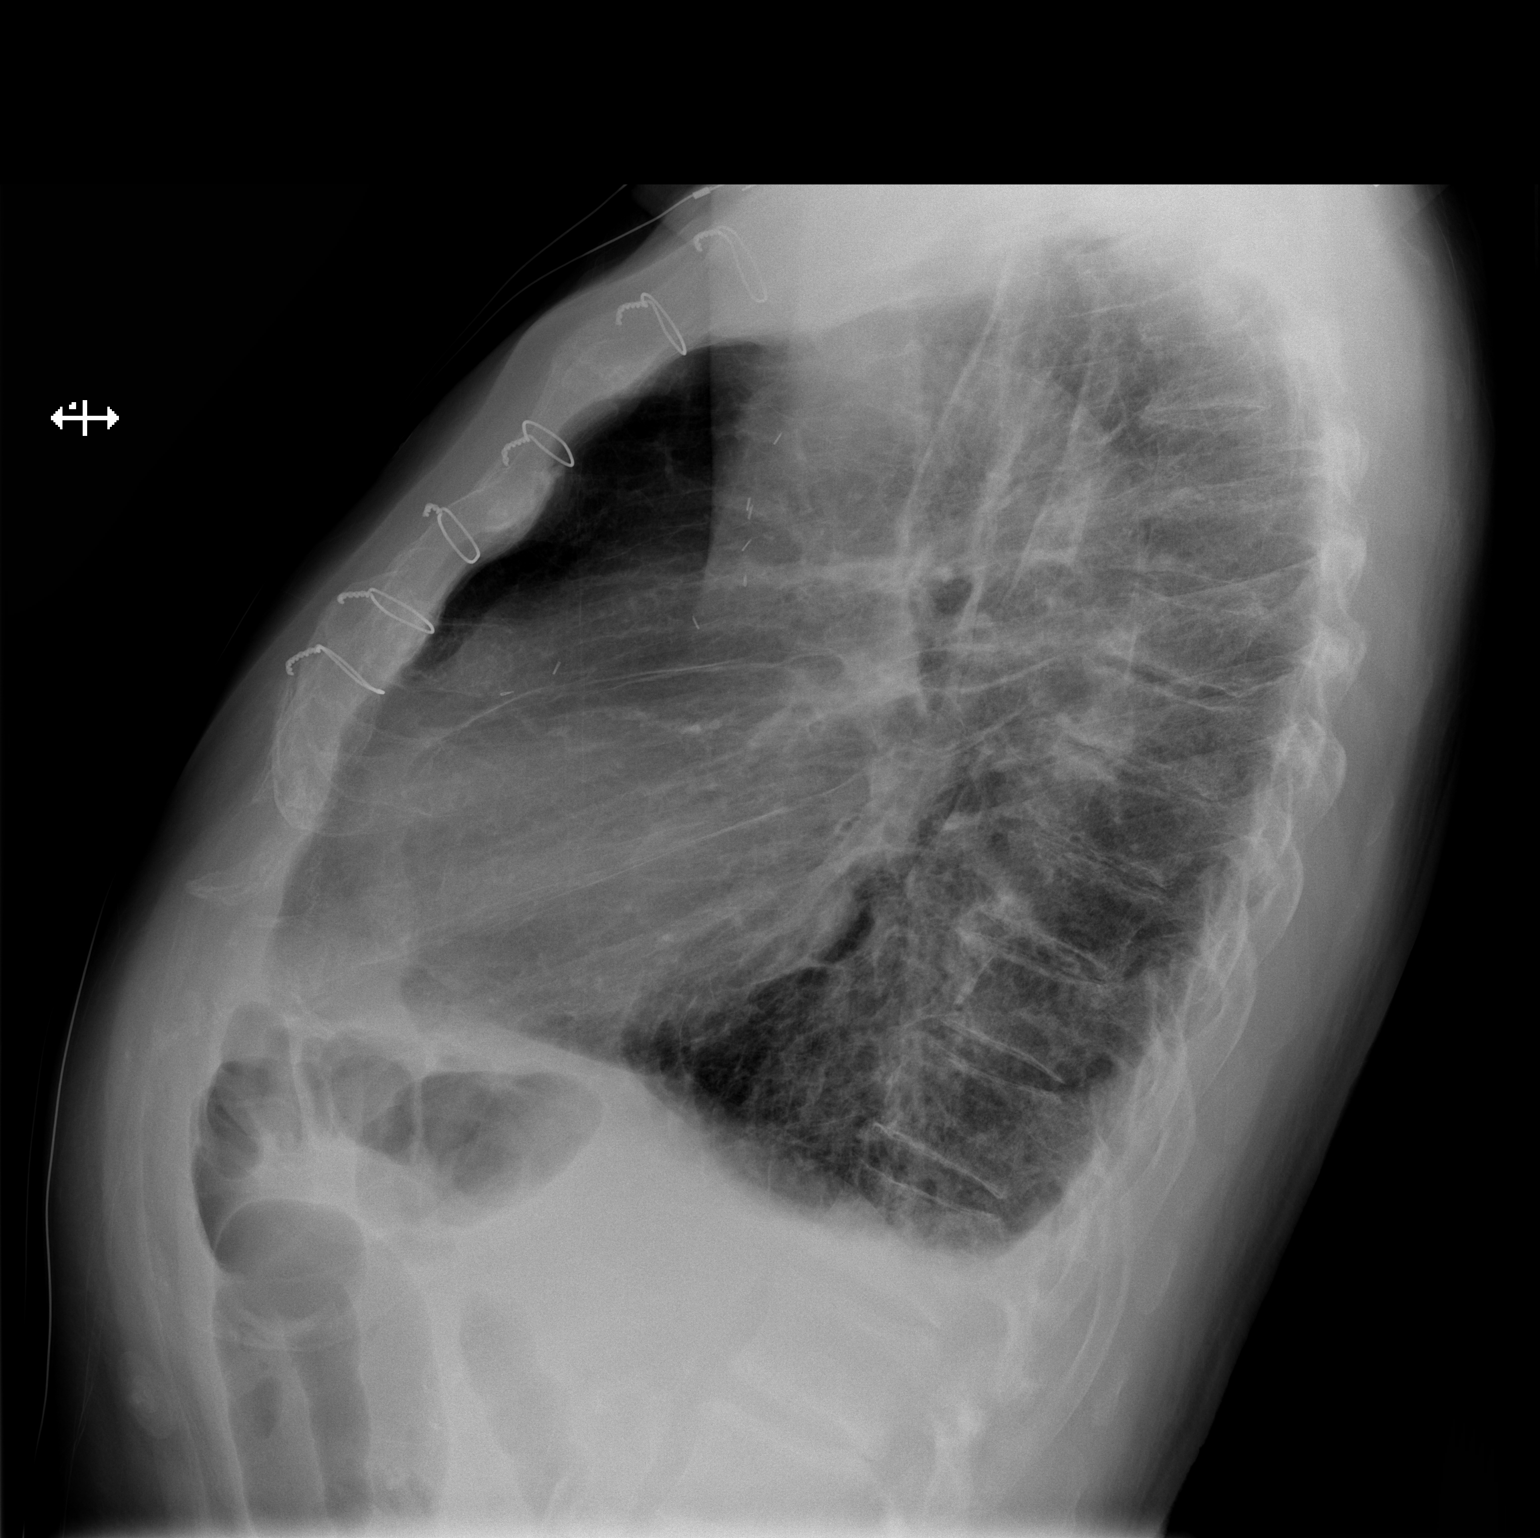

[2 of 2 positions shown; findings below may reference images not displayed]

FINDINGS: Unchanged cardiac silhouette and mediastinal contours post median
sternotomy.  Mild tortuosity of the thoracic aorta. The lungs
appear hyperexpanded with flattening of bilateral hemidiaphragms.
Pulmonary vascular is indistinct with cephalization of flow.
Interval development of small bilateral pleural effusions, right
greater than left. There is minimal thickening along the bilateral
major fissures.  No pneumothorax.  A nipple shadow overlies the
left lower lung, unchanged. No focal airspace opacities.  No
pneumothorax.  Unchanged bones.
IMPRESSION: Emphysematous change with superimposed pulmonary edema and small
bilateral effusions, right greater than left.

## 2014-04-01 ENCOUNTER — Other Ambulatory Visit: Payer: Self-pay

## 2014-04-01 MED ORDER — CLOPIDOGREL BISULFATE 75 MG PO TABS: 75.0000 mg | ORAL_TABLET | Freq: Every day | ORAL | Status: DC

## 2014-05-21 ENCOUNTER — Encounter: Payer: Self-pay | Admitting: Cardiology

## 2014-05-21 ENCOUNTER — Ambulatory Visit (INDEPENDENT_AMBULATORY_CARE_PROVIDER_SITE_OTHER): Payer: Medicare Other | Admitting: Cardiology

## 2014-05-21 VITALS — BP 118/70 | HR 54 | Ht 71.0 in | Wt 163.2 lb

## 2014-05-21 DIAGNOSIS — I2510 Atherosclerotic heart disease of native coronary artery without angina pectoris: Secondary | ICD-10-CM

## 2014-05-21 DIAGNOSIS — I679 Cerebrovascular disease, unspecified: Secondary | ICD-10-CM

## 2014-05-21 DIAGNOSIS — I471 Supraventricular tachycardia: Secondary | ICD-10-CM

## 2014-05-21 DIAGNOSIS — I498 Other specified cardiac arrhythmias: Secondary | ICD-10-CM

## 2014-05-21 MED ORDER — AMIODARONE HCL 200 MG PO TABS: 200.0000 mg | ORAL_TABLET | Freq: Every day | ORAL | Status: DC

## 2014-05-21 NOTE — Assessment & Plan Note (Signed)
Continue aspirin and statin. Discontinue Plavix. 

## 2014-05-21 NOTE — Assessment & Plan Note (Signed)
Continue present blood pressure medications. 

## 2014-05-21 NOTE — Addendum Note (Signed)
Addended by: Freddi Starr on: 05/21/2014 11:44 AM   Modules accepted: Orders

## 2014-05-21 NOTE — Assessment & Plan Note (Signed)
Patient discontinued his amiodarone. His previous SVT was asymptomatic and he was significantly tachycardic. Resume amiodarone 200 mg daily. Check TSH, liver functions and chest x-ray in 3 months.

## 2014-05-21 NOTE — Progress Notes (Signed)
HPI: FU coronary artery disease. Patient is status post coronary artery bypass graft at [facility] in 1993. He was admitted to [facility] in January of 2014 with chest pain. He ruled in for a non-ST elevation myocardial infarction. Cardiac catheterization in January of 2014 showed an occluded left main, LAD, circumflex. There was a 95% RCA. LIMA to the LAD was patent. Saphenous vein graft to the acute marginal had a 95% lesion. Saphenous vein graft to the diagonal was patent. Saphenous vein graft to the RCA had a 99% stenosis. Patient had PCI of the saphenous vein graft to the right coronary artery and saphenous vein graft to the obtuse marginal with drug-eluting stents. Echocardiogram in January 2014 showed an ejection fraction of 45-50%. The patient also had SVT during hospitalization. He was seen by electrophysiology and placed on amiodarone. Carotid Dopplers in Sept of 2014 showed 60-79% left and 40-59% right stenosis. Followup recommended in 6 months. Since last seen, He denies dyspnea, chest pain, palpitations or syncope.   Current Outpatient Prescriptions  Medication Sig Dispense Refill  . amLODipine (NORVASC) 5 MG tablet TAKE ONE TABLET BY MOUTH ONCE DAILY  30 tablet  5  . aspirin EC 81 MG EC tablet Take 1 tablet (81 mg total) by mouth daily.      Marland Kitchen atorvastatin (LIPITOR) 10 MG tablet Take 10 mg by mouth at bedtime.      . clopidogrel (PLAVIX) 75 MG tablet Take 1 tablet (75 mg total) by mouth daily with breakfast.  30 tablet  6  . doxazosin (CARDURA) 4 MG tablet Take 4 mg by mouth at bedtime.      . famotidine (PEPCID) 20 MG tablet TAKE ONE TABLET BY MOUTH TWICE DAILY  60 tablet  6  . losartan (COZAAR) 50 MG tablet Take 1 tablet (50 mg total) by mouth daily.  30 tablet  3  . metFORMIN (GLUCOPHAGE-XR) 500 MG 24 hr tablet Take 1 tablet by mouth daily.      . nitroGLYCERIN (NITROSTAT) 0.4 MG SL tablet Place 1 tablet (0.4 mg total) under the tongue every 5 (five) minutes x 3  doses as needed for chest pain.  60 tablet  3   No current facility-administered medications for this visit.     Past Medical History  Diagnosis Date  . Coronary artery disease     CABG 1993 at [facility]; NSTEMI 09/2012 s/p DES to SVG-OM1, DES to SVG-PDA   . DM (diabetes mellitus)   . HTN (hypertension)   . Bifascicular block   . SVT (supraventricular tachycardia)     Past Surgical History  Procedure Laterality Date  . Cardiac surgery      1993 CABG    History   Social History  . Marital Status: Married    Spouse Name: N/A    Number of Children: N/A  . Years of Education: N/A   Occupational History  . Not on file.   Social History Main Topics  . Smoking status: Never Smoker   . Smokeless tobacco: Not on file  . Alcohol Use: No  . Drug Use: No  . Sexual Activity: Not on file   Other Topics Concern  . Not on file   Social History Narrative  . No narrative on file    ROS: no fevers or chills, productive cough, hemoptysis, dysphasia, odynophagia, melena, hematochezia, dysuria, hematuria, rash, seizure activity, orthopnea, PND, pedal edema, claudication. Remaining systems are negative.  Physical Exam: Well-developed well-nourished in  no acute distress.  Skin is warm and dry.  HEENT is normal.  Neck is supple.  Chest is clear to auscultation with normal expansion.  Cardiovascular exam is regular rate and rhythm.  Abdominal exam nontender or distended. No masses palpated. Extremities show no edema. neuro grossly intact  ECG Sinus bradycardia with occasional PAC. First degree AV block. Right bundle branch block. Left ventricular fascicular block.

## 2014-05-21 NOTE — Assessment & Plan Note (Signed)
Continue aspirin and statin. Schedule followup carotid Dopplers. 

## 2014-05-21 NOTE — Patient Instructions (Signed)
Your physician wants you to follow-up in: 6 MONTHS WITH DR Jens Som You will receive a reminder letter in the mail two months in advance. If you don't receive a letter, please call our office to schedule the follow-up appointment.   RESTART AMIODARONE 200 MG ONCE DAILY  STOP PLAVIX  Your physician recommends that you return for lab work in: 3 MONTHS  A chest x-ray takes a picture of the organs and structures inside the chest, including the heart, lungs, and blood vessels. This test can show several things, including, whether the heart is enlarges; whether fluid is building up in the lungs; and whether pacemaker / defibrillator leads are still in place. IN 3 MONTHS AT [address] [facility]-GO TO THE BASEMENT

## 2014-05-21 NOTE — Assessment & Plan Note (Signed)
Continue statin. 

## 2014-06-06 ENCOUNTER — Encounter (INDEPENDENT_AMBULATORY_CARE_PROVIDER_SITE_OTHER): Payer: Medicare Other

## 2014-06-06 DIAGNOSIS — I679 Cerebrovascular disease, unspecified: Secondary | ICD-10-CM

## 2014-06-06 DIAGNOSIS — I6523 Occlusion and stenosis of bilateral carotid arteries: Secondary | ICD-10-CM

## 2014-08-15 ENCOUNTER — Encounter (HOSPITAL_COMMUNITY): Payer: Self-pay | Admitting: Cardiovascular Disease

## 2014-10-24 ENCOUNTER — Other Ambulatory Visit: Payer: Self-pay

## 2014-10-24 MED ORDER — FAMOTIDINE 20 MG PO TABS: 20.0000 mg | ORAL_TABLET | Freq: Two times a day (BID) | ORAL | Status: DC

## 2014-11-18 NOTE — Progress Notes (Signed)
HPI: FU coronary artery disease. Patient is status post coronary artery bypass graft at [facility] in 1993. He was [facility] in January of 2014 with chest pain. He ruled in for a non-ST elevation myocardial infarction. Cardiac catheterization in January of 2014 showed an occluded left main, LAD, circumflex. There was a 95% RCA. LIMA to the LAD was patent. Saphenous vein graft to the acute marginal had a 95% lesion. Saphenous vein graft to the diagonal was patent. Saphenous vein graft to the RCA had a 99% stenosis. Patient had PCI of the saphenous vein graft to the right coronary artery and saphenous vein graft to the obtuse marginal with drug-eluting stents. Echocardiogram in January 2014 showed an ejection fraction of 45-50%. The patient also had SVT during hospitalization. He was seen by electrophysiology and placed on amiodarone. Carotid Dopplers in Oct 2015 showed 40-59% bilateral stenosis. Followup recommended in 12 months. Since last seen, the patient denies any dyspnea on exertion, orthopnea, PND, palpitations, syncope or chest pain.   Current Outpatient Prescriptions  Medication Sig Dispense Refill  . amiodarone (PACERONE) 200 MG tablet Take 1 tablet (200 mg total) by mouth daily. 90 tablet 3  . amLODipine (NORVASC) 5 MG tablet TAKE ONE TABLET BY MOUTH ONCE DAILY 30 tablet 5  . aspirin EC 81 MG EC tablet Take 1 tablet (81 mg total) by mouth daily.    Marland Kitchen. atorvastatin (LIPITOR) 10 MG tablet Take 10 mg by mouth at bedtime.    Marland Kitchen. doxazosin (CARDURA) 4 MG tablet Take 4 mg by mouth at bedtime.    . famotidine (PEPCID) 20 MG tablet Take 1 tablet (20 mg total) by mouth 2 (two) times daily. 60 tablet 6  . losartan (COZAAR) 50 MG tablet Take 1 tablet (50 mg total) by mouth daily. 30 tablet 3  . metFORMIN (GLUCOPHAGE-XR) 500 MG 24 hr tablet Take 1 tablet by mouth daily.    . nitroGLYCERIN (NITROSTAT) 0.4 MG SL tablet Place 1 tablet (0.4 mg total) under the tongue every 5  (five) minutes x 3 doses as needed for chest pain. 60 tablet 3   No current facility-administered medications for this visit.     Past Medical History  Diagnosis Date  . Coronary artery disease     [facility]ABG [facility] at [facility]; NSTEMI 09/2012 s/p DES to SVG-OM1, DES to SVG-PDA   . DM (diabetes mellitus)   . HTN (hypertension)   . Bifascicular block   . SVT (supraventricular tachycardia)     Past Surgical History  Procedure Laterality Date  . Cardiac surgery      1993 CABG  . Left heart catheterization with coronary/graft angiogram N/A 10/04/2012    Procedure: LEFT HEART CATHETERIZATION WITH Isabel CapriceORONARY/GRAFT ANGIOGRAM;  Surgeon: Tonny BollmanMichael Cooper, MD;  [facility];  Service: Cardiovascular;  Laterality: N/A;    History   Social History  . Marital Status: Married    Spouse Name: N/A  . Number of Children: N/A  . Years of Education: N/A   Occupational History  . Not on file.   Social History Main Topics  . Smoking status: Never Smoker   . Smokeless tobacco: Not on file  . Alcohol Use: No  . Drug Use: No  . Sexual Activity: Not on file   Other Topics Concern  . Not on file   Social History Narrative    ROS: some unsteady gait and mild pedal edema but no fevers or chills, productive cough, hemoptysis,  dysphasia, odynophagia, melena, hematochezia, dysuria, hematuria, rash, seizure activity, orthopnea, PND, claudication. Remaining systems are negative.  Physical Exam: Well-developed well-nourished in no acute distress.  Skin is warm and dry.  HEENT is normal.  Neck is supple.  Chest is clear to auscultation with normal expansion.  Cardiovascular exam is regular rate and rhythm.  Abdominal exam nontender or distended. No masses palpated. Extremities show trace edema. neuro grossly intact  ECG sinus rhythm with first-degree AV block, PACs, right bundle branch block, left anterior fascicular block. Left ventricular hypertrophy.

## 2014-11-19 ENCOUNTER — Ambulatory Visit (INDEPENDENT_AMBULATORY_CARE_PROVIDER_SITE_OTHER): Payer: Medicare Other | Admitting: Cardiology

## 2014-11-19 ENCOUNTER — Encounter: Payer: Self-pay | Admitting: Cardiology

## 2014-11-19 VITALS — BP 140/82 | HR 56 | Ht 71.0 in | Wt 163.3 lb

## 2014-11-19 DIAGNOSIS — I2510 Atherosclerotic heart disease of native coronary artery without angina pectoris: Secondary | ICD-10-CM

## 2014-11-19 DIAGNOSIS — E785 Hyperlipidemia, unspecified: Secondary | ICD-10-CM

## 2014-11-19 DIAGNOSIS — I679 Cerebrovascular disease, unspecified: Secondary | ICD-10-CM | POA: Diagnosis not present

## 2014-11-19 DIAGNOSIS — I10 Essential (primary) hypertension: Secondary | ICD-10-CM

## 2014-11-19 DIAGNOSIS — I471 Supraventricular tachycardia: Secondary | ICD-10-CM

## 2014-11-19 LAB — HEPATIC FUNCTION PANEL
ALBUMIN: 3.5 g/dL (ref 3.5–5.2)
ALT: 15 U/L (ref 0–53)
AST: 22 U/L (ref 0–37)
Alkaline Phosphatase: 83 U/L (ref 39–117)
Bilirubin, Direct: 0.1 mg/dL (ref 0.0–0.3)
Indirect Bilirubin: 0.4 mg/dL (ref 0.2–1.2)
Total Bilirubin: 0.5 mg/dL (ref 0.2–1.2)
Total Protein: 5.8 g/dL — ABNORMAL LOW (ref 6.0–8.3)

## 2014-11-19 LAB — BASIC METABOLIC PANEL WITH GFR
BUN: 21 mg/dL (ref 6–23)
CO2: 26 mEq/L (ref 19–32)
Calcium: 9 mg/dL (ref 8.4–10.5)
Chloride: 104 mEq/L (ref 96–112)
Creat: 1.4 mg/dL — ABNORMAL HIGH (ref 0.50–1.35)
GFR, EST AFRICAN AMERICAN: 51 mL/min — AB
GFR, EST NON AFRICAN AMERICAN: 44 mL/min — AB
GLUCOSE: 98 mg/dL (ref 70–99)
POTASSIUM: 4.6 meq/L (ref 3.5–5.3)
SODIUM: 140 meq/L (ref 135–145)

## 2014-11-19 LAB — LIPID PANEL
Cholesterol: 139 mg/dL (ref 0–200)
HDL: 68 mg/dL (ref >=40)
LDL CALC: 60 mg/dL (ref 0–99)
TRIGLYCERIDES: 55 mg/dL (ref <150)
Total CHOL/HDL Ratio: 2 Ratio
VLDL: 11 mg/dL (ref 0–40)

## 2014-11-19 LAB — TSH: TSH: 4.551 u[IU]/mL — AB (ref 0.350–4.500)

## 2014-11-19 NOTE — Assessment & Plan Note (Signed)
Continue aspirin and statin. 

## 2014-11-19 NOTE — Assessment & Plan Note (Signed)
Follow-up carotid Dopplers October 2016. 

## 2014-11-19 NOTE — Assessment & Plan Note (Signed)
Continue statin. Check lipids and liver. 

## 2014-11-19 NOTE — Addendum Note (Signed)
Addended by: Freddi StarrMATHIS, Massiah Longanecker W on: 11/19/2014 10:12 AM   Modules accepted: Orders

## 2014-11-19 NOTE — Patient Instructions (Signed)
Your physician wants you to follow-up in: 6 MONTHS WITH DR CRENSHAW You will receive a reminder letter in the mail two months in advance. If you don't receive a letter, please call our office to schedule the follow-up appointment.   Your physician recommends that you HAVE LAB WORK TODAY  A chest x-ray takes a picture of the organs and structures inside the chest, including the heart, lungs, and blood vessels. This test can show several things, including, whether the heart is enlarges; whether fluid is building up in the lungs; and whether pacemaker / defibrillator leads are still in place. [address] [facility]-GO TO THE BASEMENT 

## 2014-11-19 NOTE — Assessment & Plan Note (Signed)
Blood pressure controlled. Continue present medications. 

## 2014-11-19 NOTE — Assessment & Plan Note (Signed)
Patient remains in sinus rhythm. Continue amiodarone. Check liver functions, TSH and chest x-ray. Check potassium and renal function.

## 2015-02-14 ENCOUNTER — Ambulatory Visit (HOSPITAL_COMMUNITY)
Admission: RE | Admit: 2015-02-14 | Discharge: 2015-02-14 | Disposition: A | Discharge status: Home / Self Care | Payer: Medicare Other | Source: Ambulatory Visit | Attending: Cardiology | Admitting: Cardiology

## 2015-02-14 ENCOUNTER — Other Ambulatory Visit (HOSPITAL_COMMUNITY)
Admission: RE | Admit: 2015-02-14 | Discharge: 2015-02-14 | Disposition: A | Discharge status: Home / Self Care | Payer: Medicare Other | Source: Ambulatory Visit | Attending: Cardiology | Admitting: Cardiology

## 2015-02-14 DIAGNOSIS — I471 Supraventricular tachycardia: Secondary | ICD-10-CM

## 2015-02-14 DIAGNOSIS — E785 Hyperlipidemia, unspecified: Secondary | ICD-10-CM | POA: Diagnosis present

## 2015-02-14 DIAGNOSIS — I10 Essential (primary) hypertension: Secondary | ICD-10-CM | POA: Insufficient documentation

## 2015-02-14 DIAGNOSIS — I679 Cerebrovascular disease, unspecified: Secondary | ICD-10-CM | POA: Insufficient documentation

## 2015-02-14 DIAGNOSIS — I2510 Atherosclerotic heart disease of native coronary artery without angina pectoris: Secondary | ICD-10-CM | POA: Diagnosis not present

## 2015-02-14 LAB — LIPID PANEL
Cholesterol: 146 mg/dL (ref 0–200)
HDL: 66 mg/dL (ref >40)
LDL Cholesterol: 73 mg/dL (ref 0–99)
Total CHOL/HDL Ratio: 2.2 RATIO
Triglycerides: 37 mg/dL (ref <150)
VLDL: 7 mg/dL (ref 0–40)

## 2015-02-14 LAB — HEPATIC FUNCTION PANEL
ALT: 19 U/L (ref 17–63)
AST: 23 U/L (ref 15–41)
Albumin: 3.6 g/dL (ref 3.5–5.0)
Alkaline Phosphatase: 69 U/L (ref 38–126)
BILIRUBIN TOTAL: 0.7 mg/dL (ref 0.3–1.2)
Bilirubin, Direct: 0.1 mg/dL (ref 0.1–0.5)
Indirect Bilirubin: 0.6 mg/dL (ref 0.3–0.9)
Total Protein: 6 g/dL — ABNORMAL LOW (ref 6.5–8.1)

## 2015-02-14 LAB — BASIC METABOLIC PANEL
Anion gap: 7 (ref 5–15)
BUN: 23 mg/dL — ABNORMAL HIGH (ref 6–20)
CO2: 27 mmol/L (ref 22–32)
Calcium: 8.8 mg/dL — ABNORMAL LOW (ref 8.9–10.3)
Chloride: 103 mmol/L (ref 101–111)
Creatinine, Ser: 1.35 mg/dL — ABNORMAL HIGH (ref 0.61–1.24)
GFR calc Af Amer: 51 mL/min — ABNORMAL LOW (ref >60)
GFR calc non Af Amer: 44 mL/min — ABNORMAL LOW (ref >60)
GLUCOSE: 147 mg/dL — AB (ref 65–99)
Potassium: 4.5 mmol/L (ref 3.5–5.1)
Sodium: 137 mmol/L (ref 135–145)

## 2015-02-14 LAB — TSH: TSH: 3.99 u[IU]/mL (ref 0.350–4.500)

## 2015-04-24 ENCOUNTER — Telehealth: Payer: Self-pay | Admitting: Cardiology

## 2015-04-24 NOTE — Telephone Encounter (Signed)
Received records from [facility] for appointment with Dr Jens Som on 05/19/15.  Records given to [facility] (medical records) for Dr Ludwig Clarks schedule on 05/19/15. lp

## 2015-05-16 NOTE — Progress Notes (Signed)
HPI: FU coronary artery disease. Patient is status post coronary artery bypass graft at [facility] in 1993. He was admitted to [facility] in January of 2014 with chest pain. He ruled in for a non-ST elevation myocardial infarction. Cardiac catheterization in January of 2014 showed an occluded left main, LAD, circumflex. There was a 95% RCA. LIMA to the LAD was patent. Saphenous vein graft to the acute marginal had a 95% lesion. Saphenous vein graft to the diagonal was patent. Saphenous vein graft to the RCA had a 99% stenosis. Patient had PCI of the saphenous vein graft to the right coronary artery and saphenous vein graft to the obtuse marginal with drug-eluting stents. Echocardiogram in January 2014 showed an ejection fraction of 45-50%. The patient also had SVT during hospitalization. He was seen by electrophysiology and placed on amiodarone. Carotid Dopplers in Oct 2015 showed 40-59% bilateral stenosis. Followup recommended in 12 months. Since last seen, patient denies dyspnea, chest pain, palpitations or syncope.  Current Outpatient Prescriptions  Medication Sig Dispense Refill  . amiodarone (PACERONE) 200 MG tablet Take 1 tablet (200 mg total) by mouth daily. 90 tablet 3  . aspirin EC 81 MG EC tablet Take 1 tablet (81 mg total) by mouth daily.    Marland Kitchen atorvastatin (LIPITOR) 10 MG tablet Take 10 mg by mouth at bedtime.    Marland Kitchen doxazosin (CARDURA) 4 MG tablet Take 4 mg by mouth at bedtime.    . famotidine (PEPCID) 20 MG tablet Take 1 tablet (20 mg total) by mouth 2 (two) times daily. 60 tablet 6  . losartan (COZAAR) 50 MG tablet Take 1 tablet (50 mg total) by mouth daily. 30 tablet 3  . metFORMIN (GLUCOPHAGE-XR) 500 MG 24 hr tablet Take 1 tablet by mouth daily.    . nitroGLYCERIN (NITROSTAT) 0.4 MG SL tablet Place 1 tablet (0.4 mg total) under the tongue every 5 (five) minutes x 3 doses as needed for chest pain. 60 tablet 3   No current facility-administered medications for this  visit.     Past Medical History  Diagnosis Date  . Coronary artery disease     CABG 1993 at [facility]; NSTEMI 09/2012 s/p DES to SVG-OM1, DES to SVG-PDA   . DM (diabetes mellitus)   . HTN (hypertension)   . Bifascicular block   . SVT (supraventricular tachycardia)     Past Surgical History  Procedure Laterality Date  . Cardiac surgery      1993 CABG  . Left heart catheterization with coronary/graft angiogram N/A 10/04/2012    Procedure: LEFT HEART CATHETERIZATION WITH Isabel Caprice;  Surgeon: Tonny Bollman, MD;  Location: [facility] C[facility] LAB;  Service: Cardiovascular;  Laterality: N/A;    Social History   Social History  . Marital Status: Married    Spouse Name: N/A  . Number of Children: N/A  . Years of Education: N/A   Occupational History  . Not on file.   Social History Main Topics  . Smoking status: Never Smoker   . Smokeless tobacco: Not on file  . Alcohol Use: No  . Drug Use: No  . Sexual Activity: Not on file   Other Topics Concern  . Not on file   Social History Narrative    ROS: no fevers or chills, productive cough, hemoptysis, dysphasia, odynophagia, melena, hematochezia, dysuria, hematuria, rash, seizure activity, orthopnea, PND, pedal edema, claudication. Remaining systems are negative.  Physical Exam: Well-developed well-nourished in no acute distress.  Skin is warm  and dry.  HEENT is normal.  Neck is supple.  Chest is clear to auscultation with normal expansion.  Cardiovascular exam is regular rate and rhythm.  Abdominal exam nontender or distended. No masses palpated. Extremities show no edema. neuro grossly intact  ECG sinus rhythm with nonconducted PAC, right bundle branch block, left anterior fascicular block, cannot rule out prior septal infarct.

## 2015-05-19 ENCOUNTER — Ambulatory Visit (INDEPENDENT_AMBULATORY_CARE_PROVIDER_SITE_OTHER): Payer: Medicare Other | Admitting: Cardiology

## 2015-05-19 ENCOUNTER — Encounter: Payer: Self-pay | Admitting: Cardiology

## 2015-05-19 VITALS — BP 150/60 | HR 62 | Ht 71.0 in | Wt 158.6 lb

## 2015-05-19 DIAGNOSIS — E785 Hyperlipidemia, unspecified: Secondary | ICD-10-CM | POA: Diagnosis not present

## 2015-05-19 DIAGNOSIS — I679 Cerebrovascular disease, unspecified: Secondary | ICD-10-CM

## 2015-05-19 DIAGNOSIS — I2510 Atherosclerotic heart disease of native coronary artery without angina pectoris: Secondary | ICD-10-CM

## 2015-05-19 DIAGNOSIS — I10 Essential (primary) hypertension: Secondary | ICD-10-CM

## 2015-05-19 DIAGNOSIS — I471 Supraventricular tachycardia: Secondary | ICD-10-CM

## 2015-05-19 DIAGNOSIS — I2583 Coronary atherosclerosis due to lipid rich plaque: Secondary | ICD-10-CM

## 2015-05-19 MED ORDER — AMIODARONE HCL 200 MG PO TABS: 100.0000 mg | ORAL_TABLET | Freq: Every day | ORAL | Status: DC

## 2015-05-19 MED ORDER — AMLODIPINE BESYLATE 5 MG PO TABS: 5.0000 mg | ORAL_TABLET | Freq: Every day | ORAL | Status: DC

## 2015-05-19 NOTE — Assessment & Plan Note (Signed)
Continue aspirin and statin. 

## 2015-05-19 NOTE — Patient Instructions (Addendum)
.  Your physician wants you to follow-up in: 6 MONTHS WITH DR Jens Som You will receive a reminder letter in the mail two months in advance. If you don't receive a letter, please call our office to schedule the follow-up appointment.   START AMLODIPINE 5 MG ONCE DAILY  DECREASE AMIODARONE TO 100 GM ONCE DAILY= 1/2 OF 200 MG TABLET ONCE DAILY  Your physician recommends that you HAVE LAB WORK TODAY  A chest x-ray takes a picture of the organs and structures inside the chest, including the heart, lungs, and blood vessels. This test can show several things, including, whether the heart is enlarges; whether fluid is building up in the lungs; and whether pacemaker / defibrillator leads are still in place.   [address] [place] [facility]- GO TO THE BASEMENT

## 2015-05-19 NOTE — Assessment & Plan Note (Addendum)
Continue amiodarone but decrease dose to 100 mg daily. Check liver functions, TSH and chest x-ray.

## 2015-05-19 NOTE — Assessment & Plan Note (Addendum)
Blood pressure borderline. Resume Norvasc 5 mg daily and follow.

## 2015-05-19 NOTE — Assessment & Plan Note (Signed)
Continue statin. 

## 2015-05-19 NOTE — Assessment & Plan Note (Signed)
Follow-up carotid Dopplers October 2016. 

## 2015-06-04 ENCOUNTER — Other Ambulatory Visit: Payer: Self-pay | Admitting: Cardiology

## 2015-06-19 ENCOUNTER — Other Ambulatory Visit (HOSPITAL_COMMUNITY)
Admission: RE | Admit: 2015-06-19 | Discharge: 2015-06-19 | Disposition: A | Discharge status: Home / Self Care | Payer: Medicare Other | Source: Ambulatory Visit | Attending: Cardiology | Admitting: Cardiology

## 2015-06-19 ENCOUNTER — Ambulatory Visit (HOSPITAL_COMMUNITY)
Admission: RE | Admit: 2015-06-19 | Discharge: 2015-06-19 | Disposition: A | Discharge status: Home / Self Care | Payer: Medicare Other | Source: Ambulatory Visit | Attending: Cardiology | Admitting: Cardiology

## 2015-06-19 DIAGNOSIS — I471 Supraventricular tachycardia: Secondary | ICD-10-CM | POA: Insufficient documentation

## 2015-06-19 LAB — TSH: TSH: 5.071 u[IU]/mL — ABNORMAL HIGH (ref 0.350–4.500)

## 2015-06-19 LAB — HEPATIC FUNCTION PANEL
ALT: 22 U/L (ref 17–63)
AST: 25 U/L (ref 15–41)
Albumin: 3.6 g/dL (ref 3.5–5.0)
Alkaline Phosphatase: 74 U/L (ref 38–126)
BILIRUBIN DIRECT: 0.1 mg/dL (ref 0.1–0.5)
BILIRUBIN TOTAL: 0.8 mg/dL (ref 0.3–1.2)
Indirect Bilirubin: 0.7 mg/dL (ref 0.3–0.9)
Total Protein: 6.1 g/dL — ABNORMAL LOW (ref 6.5–8.1)

## 2015-07-01 ENCOUNTER — Encounter: Payer: Self-pay | Admitting: Cardiology

## 2015-10-30 ENCOUNTER — Other Ambulatory Visit: Payer: Self-pay | Admitting: Cardiology

## 2015-10-30 NOTE — Telephone Encounter (Signed)
Rx(s) sent to pharmacy electronically.  

## 2015-11-24 NOTE — Progress Notes (Signed)
HPI: FU coronary artery disease. Patient is status post coronary artery bypass graft at [facility] in 1993. He was admitted to [facility] in January of 2014 with chest pain. He ruled in for a non-ST elevation myocardial infarction. Cardiac catheterization in January of 2014 showed an occluded left main, LAD, circumflex. There was a 95% RCA. LIMA to the LAD was patent. Saphenous vein graft to the acute marginal had a 95% lesion. Saphenous vein graft to the diagonal was patent. Saphenous vein graft to the RCA had a 99% stenosis. Patient had PCI of the saphenous vein graft to the right coronary artery and saphenous vein graft to the obtuse marginal with drug-eluting stents. Echocardiogram in January 2014 showed an ejection fraction of 45-50%. The patient also had SVT during hospitalization. He was seen by electrophysiology and placed on amiodarone. Carotid Dopplers in Oct 2015 showed 40-59% bilateral stenosis. Followup recommended in 12 months. Since last seen, the patient denies any dyspnea on exertion, orthopnea, PND, pedal edema, palpitations, syncope or chest pain.   Current Outpatient Prescriptions  Medication Sig Dispense Refill  . amiodarone (PACERONE) 200 MG tablet TAKE ONE TABLET BY MOUTH ONCE DAILY (Patient taking differently: TAKE ONE HALF TABLET BY MOUTH ONCE DAILY) 90 tablet 3  . amLODipine (NORVASC) 5 MG tablet Take 1 tablet (5 mg total) by mouth daily. 90 tablet 3  . aspirin EC 81 MG EC tablet Take 1 tablet (81 mg total) by mouth daily.    Marland Kitchen. atorvastatin (LIPITOR) 10 MG tablet Take 10 mg by mouth at bedtime.    Marland Kitchen. doxazosin (CARDURA) 4 MG tablet Take 4 mg by mouth at bedtime.    . famotidine (PEPCID) 20 MG tablet Take 1 tablet (20 mg total) by mouth 2 (two) times daily. PLEASE CALL THE OFFICE FOR ADDITIONAL REFILLS 60 tablet 0  . losartan (COZAAR) 50 MG tablet Take 1 tablet (50 mg total) by mouth daily. 30 tablet 3  . metFORMIN (GLUCOPHAGE-XR) 500 MG 24 hr tablet Take  1 tablet by mouth daily.    . nitroGLYCERIN (NITROSTAT) 0.4 MG SL tablet Place 1 tablet (0.4 mg total) under the tongue every 5 (five) minutes x 3 doses as needed for chest pain. 60 tablet 3   No current facility-administered medications for this visit.     Past Medical History  Diagnosis Date  . Coronary artery disease     CABG [facility] at [facility]; NSTEMI 09/2012 s/p DES to SVG-OM1, DES to SVG-PDA   . DM (diabetes mellitus) (HCC)   . HTN (hypertension)   . Bifascicular block   . SVT (supraventricular tachycardia) [facility](HCC)     Past Surgical History  Procedure Laterality Date  . Cardiac surgery      1993 CABG  . Left heart catheterization with coronary/graft angiogram N/A 10/04/2012    Procedure: LEFT HEART CATHETERIZATION WITH Isabel CapriceORONARY/GRAFT ANGIOGRAM;  Surgeon: Tonny BollmanMichael Cooper, MD;  [facility];  Service: Cardiovascular;  Laterality: N/A;    Social History   Social History  . Marital Status: Married    Spouse Name: N/A  . Number of Children: N/A  . Years of Education: N/A   Occupational History  . Not on file.   Social History Main Topics  . Smoking status: Never Smoker   . Smokeless tobacco: Not on file  . Alcohol Use: No  . Drug Use: No  . Sexual Activity: Not on file   Other Topics Concern  . Not on file  Social History Narrative    Family History  Problem Relation Age of Onset  . CAD      multiple relatives including father and son    ROS: no fevers or chills, productive cough, hemoptysis, dysphasia, odynophagia, melena, hematochezia, dysuria, hematuria, rash, seizure activity, orthopnea, PND, pedal edema, claudication. Remaining systems are negative.  Physical Exam: Well-developed well-nourished in no acute distress.  Skin is warm and dry.  HEENT is normal.  Neck is supple.  Chest is clear to auscultation with normal expansion.  Cardiovascular exam is regular rate and rhythm.  Abdominal exam nontender or distended. No masses  palpated. Extremities show no edema. neuro grossly intact  ECG Sinus rhythm with PACs. Right bundle branch block. Left anterior fascicular block.

## 2015-11-28 ENCOUNTER — Encounter: Payer: Self-pay | Admitting: Cardiology

## 2015-11-28 ENCOUNTER — Ambulatory Visit (INDEPENDENT_AMBULATORY_CARE_PROVIDER_SITE_OTHER): Payer: Medicare Other | Admitting: Cardiology

## 2015-11-28 VITALS — BP 134/70 | HR 61 | Ht 71.0 in | Wt 162.0 lb

## 2015-11-28 DIAGNOSIS — I679 Cerebrovascular disease, unspecified: Secondary | ICD-10-CM

## 2015-11-28 DIAGNOSIS — E785 Hyperlipidemia, unspecified: Secondary | ICD-10-CM | POA: Diagnosis not present

## 2015-11-28 DIAGNOSIS — I10 Essential (primary) hypertension: Secondary | ICD-10-CM

## 2015-11-28 DIAGNOSIS — I2510 Atherosclerotic heart disease of native coronary artery without angina pectoris: Secondary | ICD-10-CM | POA: Diagnosis not present

## 2015-11-28 DIAGNOSIS — I471 Supraventricular tachycardia: Secondary | ICD-10-CM

## 2015-11-28 DIAGNOSIS — I2583 Coronary atherosclerosis due to lipid rich plaque: Principal | ICD-10-CM

## 2015-11-28 NOTE — Assessment & Plan Note (Signed)
Continue statin. 

## 2015-11-28 NOTE — Patient Instructions (Signed)
Medication Instructions:   NO CHANGE  Labwork:  Your physician recommends that you HAVE LAB WORK TODAY  Testing/Procedures:  Your physician has requested that you have a carotid duplex. This test is an ultrasound of the carotid arteries in your neck. It looks at blood flow through these arteries that supply the brain with blood. Allow one hour for this exam. There are no restrictions or special instructions.  A chest x-ray takes a picture of the organs and structures inside the chest, including the heart, lungs, and blood vessels. This test can show several things, including, whether the heart is enlarges; whether fluid is building up in the lungs; and whether pacemaker / defibrillator leads are still in place. AT [facility]    Follow-Up:  Your physician wants you to follow-up in: 6 MONTHS WITH DR CRENSHAW You will receive a reminder letter in the mail two months in advance. If you don't receive a letter, please call our office to schedule the follow-up appointment.   If you need a refill on your cardiac medications before your next appointment, please call your pharmacy.    

## 2015-11-28 NOTE — Assessment & Plan Note (Signed)
Continue amiodarone. Check chest x-ray, liver functions and TSH. 

## 2015-11-28 NOTE — Addendum Note (Signed)
Addended by: Freddi StarrMATHIS, Zeeshan Korte W on: 11/28/2015 11:54 AM   Modules accepted: Orders

## 2015-11-28 NOTE — Assessment & Plan Note (Signed)
Blood pressure controlled. Continue present medications. 

## 2015-11-28 NOTE — Assessment & Plan Note (Signed)
Continue aspirin and statin. 

## 2015-11-28 NOTE — Assessment & Plan Note (Signed)
Schedule follow-up carotid Dopplers. 

## 2015-12-03 ENCOUNTER — Ambulatory Visit (HOSPITAL_COMMUNITY)
Admission: RE | Admit: 2015-12-03 | Discharge: 2015-12-03 | Disposition: A | Discharge status: Home / Self Care | Payer: Medicare Other | Source: Ambulatory Visit | Attending: Cardiology | Admitting: Cardiology

## 2015-12-03 ENCOUNTER — Other Ambulatory Visit (HOSPITAL_COMMUNITY)
Admission: RE | Admit: 2015-12-03 | Discharge: 2015-12-03 | Disposition: A | Discharge status: Home / Self Care | Payer: Medicare Other | Source: Ambulatory Visit | Attending: Cardiology | Admitting: Cardiology

## 2015-12-03 DIAGNOSIS — I471 Supraventricular tachycardia: Secondary | ICD-10-CM | POA: Insufficient documentation

## 2015-12-03 DIAGNOSIS — I6523 Occlusion and stenosis of bilateral carotid arteries: Secondary | ICD-10-CM | POA: Diagnosis not present

## 2015-12-03 DIAGNOSIS — I679 Cerebrovascular disease, unspecified: Secondary | ICD-10-CM | POA: Diagnosis not present

## 2015-12-03 LAB — BASIC METABOLIC PANEL
ANION GAP: 4 — AB (ref 5–15)
BUN: 27 mg/dL — ABNORMAL HIGH (ref 6–20)
CO2: 26 mmol/L (ref 22–32)
Calcium: 8.8 mg/dL — ABNORMAL LOW (ref 8.9–10.3)
Chloride: 106 mmol/L (ref 101–111)
Creatinine, Ser: 1.22 mg/dL (ref 0.61–1.24)
GFR calc Af Amer: 57 mL/min — ABNORMAL LOW (ref >60)
GFR, EST NON AFRICAN AMERICAN: 50 mL/min — AB (ref >60)
GLUCOSE: 126 mg/dL — AB (ref 65–99)
POTASSIUM: 4.8 mmol/L (ref 3.5–5.1)
Sodium: 136 mmol/L (ref 135–145)

## 2015-12-03 LAB — HEPATIC FUNCTION PANEL
ALT: 21 U/L (ref 17–63)
AST: 24 U/L (ref 15–41)
Albumin: 3.8 g/dL (ref 3.5–5.0)
Alkaline Phosphatase: 77 U/L (ref 38–126)
BILIRUBIN INDIRECT: 0.2 mg/dL — AB (ref 0.3–0.9)
Bilirubin, Direct: 0.1 mg/dL (ref 0.1–0.5)
TOTAL PROTEIN: 6.1 g/dL — AB (ref 6.5–8.1)
Total Bilirubin: 0.3 mg/dL (ref 0.3–1.2)

## 2015-12-03 LAB — TSH: TSH: 4.173 u[IU]/mL (ref 0.350–4.500)

## 2015-12-30 ENCOUNTER — Other Ambulatory Visit: Payer: Self-pay | Admitting: Cardiology

## 2015-12-30 NOTE — Telephone Encounter (Signed)
Rx(s) sent to pharmacy electronically.  

## 2016-05-20 NOTE — Progress Notes (Signed)
HPI: FU coronary artery disease. Patient is status post coronary artery bypass graft at [facility] in 1993. He was [facility] to [facility] in January of 2014 with chest pain. He ruled in for a non-ST elevation myocardial infarction. Cardiac catheterization in January of 2014 showed an occluded left main, LAD, circumflex. There was a 95% RCA. LIMA to the LAD was patent. Saphenous vein graft to the acute marginal had a 95% lesion. Saphenous vein graft to the diagonal was patent. Saphenous vein graft to the RCA had a 99% stenosis. Patient had PCI of the saphenous vein graft to the right coronary artery and saphenous vein graft to the obtuse marginal with drug-eluting stents. Echocardiogram in January 2014 showed an ejection fraction of 45-50%. The patient also had SVT during hospitalization. He was seen by electrophysiology and placed on amiodarone. Carotid Dopplers March 2017 showed moderate atherosclerosis but no hemodynamically significant internal carotid artery stenosis bilaterally. Since last seen, he denies dyspnea, chest pain, palpitations or syncope. Minimal pedal edema.  Current Outpatient Prescriptions  Medication Sig Dispense Refill  . amiodarone (PACERONE) 200 MG tablet TAKE ONE HALF TABLET BY MOUTH ONCE DAILY 90 tablet 3  . amLODipine (NORVASC) 5 MG tablet Take 1 tablet (5 mg total) by mouth daily. 90 tablet 3  . aspirin EC 81 MG EC tablet Take 1 tablet (81 mg total) by mouth daily.    Marland Kitchen. atorvastatin (LIPITOR) 10 MG tablet Take 10 mg by mouth at bedtime.    Marland Kitchen. doxazosin (CARDURA) 4 MG tablet Take 4 mg by mouth at bedtime.    . famotidine (PEPCID) 20 MG tablet Take 1 tablet (20 mg total) by mouth 2 (two) times daily. (Patient taking differently: Take 20 mg by mouth daily. ) 60 tablet 11  . losartan (COZAAR) 50 MG tablet Take 1 tablet (50 mg total) by mouth daily. 30 tablet 3  . metFORMIN (GLUCOPHAGE-XR) 500 MG 24 hr tablet Take 1 tablet by mouth daily.    . nitroGLYCERIN  (NITROSTAT) 0.4 MG SL tablet Place 1 tablet (0.4 mg total) under the tongue every 5 (five) minutes x 3 doses as needed for chest pain. 60 tablet 3   No current facility-administered medications for this visit.      Past Medical History:  Diagnosis Date  . Bifascicular block   . Coronary artery disease    CABG [facility] at [facility]; NSTEMI 09/2012 s/p DES to SVG-OM1, DES to SVG-PDA   . DM (diabetes mellitus) (HCC)   . HTN (hypertension)   . SVT (supraventricular tachycardia) (HCC)     Past Surgical History:  Procedure Laterality Date  . CARDIAC SURGERY     1993 CABG  . LEFT HEART CATHETERIZATION WITH CORONARY/GRAFT ANGIOGRAM N/A 10/04/2012   Procedure: LEFT HEART CATHETERIZATION WITH Isabel CapriceORONARY/GRAFT ANGIOGRAM;  Surgeon: Tonny BollmanMichael Cooper, MD;  [facility];  Service: Cardiovascular;  Laterality: N/A;    Social History   Social History  . Marital status: Married    Spouse name: N/A  . Number of children: N/A  . Years of education: N/A   Occupational History  . Not on file.   Social History Main Topics  . Smoking status: Never Smoker  . Smokeless tobacco: Never Used  . Alcohol use No  . Drug use: No  . Sexual activity: Not on file   Other Topics Concern  . Not on file   Social History Narrative  . No narrative on file    Family History  Problem Relation Age of Onset  . CAD      multiple relatives including father and son    ROS: no fevers or chills, productive cough, hemoptysis, dysphasia, odynophagia, melena, hematochezia, dysuria, hematuria, rash, seizure activity, orthopnea, PND, pedal edema, claudication. Remaining systems are negative.  Physical Exam: Well-developed well-nourished in no acute distress.  Skin is warm and dry.  HEENT is normal.  Neck is supple.  Chest is clear to auscultation with normal expansion.  Cardiovascular exam is regular rate and rhythm.  Abdominal exam nontender or distended. No masses palpated. Extremities show trace  to 1+ edema. neuro grossly intact  ECG-Sinus rhythm with PACs. Right bundle branch block and left anterior fascicular block. No ST changes.  A/P  1 coronary artery disease-continue aspirin and statin.  2 carotid artery disease-continue aspirin and statin.  3 hypertension-blood pressure is borderline and he follows this at home and it is typically controlled. Continue present medications and follow.  4 hyperlipidemia-continue statin.  5 supraventricular tachycardia-patient remains in sinus rhythm. Continue amiodarone. Check chest x-ray, liver functions and TSH.  Olga Millers, MD

## 2016-05-26 ENCOUNTER — Encounter: Payer: Self-pay | Admitting: Cardiology

## 2016-05-26 ENCOUNTER — Ambulatory Visit (INDEPENDENT_AMBULATORY_CARE_PROVIDER_SITE_OTHER): Payer: Medicare Other | Admitting: Cardiology

## 2016-05-26 VITALS — BP 152/84 | HR 66 | Ht 71.0 in | Wt 163.0 lb

## 2016-05-26 DIAGNOSIS — I25810 Atherosclerosis of coronary artery bypass graft(s) without angina pectoris: Secondary | ICD-10-CM | POA: Diagnosis not present

## 2016-05-26 DIAGNOSIS — I10 Essential (primary) hypertension: Secondary | ICD-10-CM

## 2016-05-26 DIAGNOSIS — E785 Hyperlipidemia, unspecified: Secondary | ICD-10-CM

## 2016-05-26 DIAGNOSIS — I471 Supraventricular tachycardia: Secondary | ICD-10-CM | POA: Diagnosis not present

## 2016-05-26 DIAGNOSIS — I2510 Atherosclerotic heart disease of native coronary artery without angina pectoris: Secondary | ICD-10-CM

## 2016-05-26 NOTE — Patient Instructions (Signed)
Medication Instructions:   NO CHANGE  Labwork:  Your physician recommends that you HAVE LAB WORK TODAY  Testing/Procedures:  A chest x-ray takes a picture of the organs and structures inside the chest, including the heart, lungs, and blood vessels. This test can show several things, including, whether the heart is enlarges; whether fluid is building up in the lungs; and whether pacemaker / defibrillator leads are still in place. AT [facility]  Follow-Up:  Your physician wants you to follow-up in: 6 MONTHS WITH DR CRENSHAW You will receive a reminder letter in the mail two months in advance. If you don't receive a letter, please call our office to schedule the follow-up appointment.   If you need a refill on your cardiac medications before your next appointment, please call your pharmacy.    

## 2016-05-31 ENCOUNTER — Ambulatory Visit (HOSPITAL_COMMUNITY)
Admission: RE | Admit: 2016-05-31 | Discharge: 2016-05-31 | Disposition: A | Discharge status: Home / Self Care | Payer: Medicare Other | Source: Ambulatory Visit | Attending: Cardiology | Admitting: Cardiology

## 2016-05-31 ENCOUNTER — Other Ambulatory Visit (HOSPITAL_COMMUNITY)
Admission: RE | Admit: 2016-05-31 | Discharge: 2016-05-31 | Disposition: A | Discharge status: Home / Self Care | Payer: Medicare Other | Source: Ambulatory Visit | Attending: Cardiology | Admitting: Cardiology

## 2016-05-31 DIAGNOSIS — I471 Supraventricular tachycardia: Secondary | ICD-10-CM | POA: Diagnosis present

## 2016-05-31 DIAGNOSIS — I25810 Atherosclerosis of coronary artery bypass graft(s) without angina pectoris: Secondary | ICD-10-CM | POA: Insufficient documentation

## 2016-05-31 DIAGNOSIS — J439 Emphysema, unspecified: Secondary | ICD-10-CM | POA: Diagnosis not present

## 2016-05-31 DIAGNOSIS — I700 Atherosclerosis of aorta: Secondary | ICD-10-CM | POA: Diagnosis not present

## 2016-05-31 DIAGNOSIS — Z79899 Other long term (current) drug therapy: Secondary | ICD-10-CM | POA: Diagnosis present

## 2016-05-31 LAB — HEPATIC FUNCTION PANEL
ALBUMIN: 3.7 g/dL (ref 3.5–5.0)
ALT: 16 U/L — ABNORMAL LOW (ref 17–63)
AST: 20 U/L (ref 15–41)
Alkaline Phosphatase: 72 U/L (ref 38–126)
Bilirubin, Direct: 0.1 mg/dL (ref 0.1–0.5)
Indirect Bilirubin: 0.6 mg/dL (ref 0.3–0.9)
TOTAL PROTEIN: 5.9 g/dL — AB (ref 6.5–8.1)
Total Bilirubin: 0.7 mg/dL (ref 0.3–1.2)

## 2016-05-31 LAB — TSH: TSH: 5.071 u[IU]/mL — AB (ref 0.350–4.500)

## 2016-06-05 ENCOUNTER — Other Ambulatory Visit: Payer: Self-pay | Admitting: Cardiology

## 2016-06-05 DIAGNOSIS — I10 Essential (primary) hypertension: Secondary | ICD-10-CM

## 2016-06-07 NOTE — Telephone Encounter (Signed)
Rx request sent to pharmacy.  

## 2016-11-01 ENCOUNTER — Telehealth: Payer: Self-pay | Admitting: Cardiology

## 2016-11-01 NOTE — Telephone Encounter (Signed)
Spoke with son he states that pt has had 2 episodes of palpitations since 10-22-16, one on the 16th and one on the 23rd, was not able to tell me duration these episodes. Pt does not know any BP readings/pulse. Pt states that he is only lightheaded denies any other symptoms (chest pain or pressure, shortness of breath or dizziness). Son was just "wanting to stay on top of this" and ask dr Jens Somrenshaw if he needs to be seen before scheduled appt 11-29-16. Please advise

## 2016-11-01 NOTE — Telephone Encounter (Signed)
New Message  Patient c/o Palpitations:  High priority if patient c/o lightheadedness and shortness of breath.  1. How long have you been having palpitations? Since last friday  2. Are you currently experiencing lightheadedness and shortness of breath? Yes, does happen frequently. Pt had stint procedure 3-4 yrs ago.  3. Have you checked your BP and heart rate? (document readings) No  4. Are you experiencing any other symptoms? No  Pts son to call in about an hour when he'll be around his father when a nurse call.  Pts son voiced if you call him without someone present he will state nothing is wrong, everything ok.

## 2016-11-01 NOTE — Telephone Encounter (Signed)
Ok to fu as scheduled unless recurrent symptoms; if more symptoms can move ov up Rite AidBrian Chidi Shirer

## 2016-11-01 NOTE — Telephone Encounter (Signed)
F/u Message  Pt son returning call. Please call back to discuss

## 2016-11-01 NOTE — Telephone Encounter (Signed)
Pt's son says to call back in 

## 2016-11-03 NOTE — Telephone Encounter (Signed)
Left message for son of dr Ludwig Clarkscrenshaw's recommendations. They are to call back with concerns.

## 2016-11-15 NOTE — Progress Notes (Signed)
HPI: FU coronary artery disease. Patient is status post coronary artery bypass graft at [facility] in 1993. He was admitted to [facility] in January of 2014 with chest pain. He ruled in for a non-ST elevation myocardial infarction. Cardiac catheterization in January of 2014 showed an occluded left main, LAD, circumflex. There was a 95% RCA. LIMA to the LAD was patent. Saphenous vein graft to the acute marginal had a 95% lesion. Saphenous vein graft to the diagonal was patent. Saphenous vein graft to the RCA had a 99% stenosis. Patient had PCI of the saphenous vein graft to the right coronary artery and saphenous vein graft to the obtuse marginal with drug-eluting stents. Echocardiogram in January 2014 showed an ejection fraction of 45-50%. The patient also had SVT during hospitalization. He was seen by electrophysiology and placed on amiodarone. Carotid Dopplers March 2017 showed moderate atherosclerosis but no hemodynamically significant internal carotid artery stenosis bilaterally. Since last seen, patient had 2 episodes of palpitations described as heart racing. There was associated weakness but no chest pain, dyspnea or syncope. He states he had not been taking his amiodarone for several days prior to these events. He otherwise denies dyspnea on exertion or exertional chest pain.  Current Outpatient Prescriptions  Medication Sig Dispense Refill  . amiodarone (PACERONE) 200 MG tablet TAKE ONE HALF TABLET BY MOUTH ONCE DAILY 90 tablet 3  . amLODipine (NORVASC) 5 MG tablet TAKE ONE TABLET BY MOUTH ONCE DAILY 90 tablet 3  . aspirin EC 81 MG EC tablet Take 1 tablet (81 mg total) by mouth daily.    Marland Kitchen atorvastatin (LIPITOR) 10 MG tablet Take 10 mg by mouth at bedtime.    Marland Kitchen doxazosin (CARDURA) 4 MG tablet Take 4 mg by mouth at bedtime.    . famotidine (PEPCID) 20 MG tablet Take 1 tablet (20 mg total) by mouth 2 (two) times daily. (Patient taking differently: Take 20 mg by mouth daily. )  60 tablet 11  . losartan (COZAAR) 50 MG tablet Take 1 tablet (50 mg total) by mouth daily. 30 tablet 3  . metFORMIN (GLUCOPHAGE-XR) 500 MG 24 hr tablet Take 1 tablet by mouth daily.    . nitroGLYCERIN (NITROSTAT) 0.4 MG SL tablet Place 1 tablet (0.4 mg total) under the tongue every 5 (five) minutes x 3 doses as needed for chest pain. 60 tablet 3   No current facility-administered medications for this visit.      Past Medical History:  Diagnosis Date  . Bifascicular block   . Coronary artery disease    CABG 1993 at [facility]; NSTEMI 09/2012 s/p DES to SVG-OM1, DES to SVG-PDA   . DM (diabetes mellitus) (HCC)   . HTN (hypertension)   . SVT (supraventricular tachycardia) (HCC)     Past Surgical History:  Procedure Laterality Date  . CARDIAC SURGERY     1993 CABG  . LEFT HEART CATHETERIZATION WITH CORONARY/GRAFT ANGIOGRAM N/A 10/04/2012   Procedure: LEFT HEART CATHETERIZATION WITH Isabel Caprice;  Surgeon: Tonny Bollman, MD;  Location[facility] CATH LAB;  Service: Cardiovascular;  Laterality: N/A;    Social History   Social History  . Marital status: Married    Spouse name: N/A  . Number of children: N/A  . Years of education: N/A   Occupational History  . Not on file.   Social History Main Topics  . Smoking status: Never Smoker  . Smokeless tobacco: Never Used  . Alcohol use No  . Drug use:  No  . Sexual activity: Not on file   Other Topics Concern  . Not on file   Social History Narrative  . No narrative on file    Family History  Problem Relation Age of Onset  . CAD      multiple relatives including father and son    ROS: no fevers or chills, productive cough, hemoptysis, dysphasia, odynophagia, melena, hematochezia, dysuria, hematuria, rash, seizure activity, orthopnea, PND, pedal edema, claudication. Remaining systems are negative.  Physical Exam: Well-developed well-nourished in no acute distress.  Skin is warm and dry.  HEENT is normal.  Neck  is supple.  Chest is clear to auscultation with normal expansion.  Cardiovascular exam is regular rate and rhythm.  Abdominal exam nontender or distended. No masses palpated. Extremities show trace to 1+ edema. neuro grossly intact  ECG- Sinus rhythm with PACs. Right bundle branch block. Left anterior fascicular block. personally reviewed  A/P  1 coronary artery disease-continue aspirin and statin.  2 hypertension-blood pressure controlled. Continue present medications.  3 carotid artery disease-continue aspirin and statin.  4 hyperlipidemia-continue statin.  5 SVT-patient remains in sinus rhythm in ECG. He did have 2 episodes of palpitations when he missed his amiodarone. We stressed compliance today. Continue amiodarone. Check TSH, liver functions and chest x-ray. We also discussed the possibility of alivecor vs event monitor with recurrent events.  Olga MillersBrian Maudy Yonan, MD

## 2016-11-18 ENCOUNTER — Encounter: Payer: Self-pay | Admitting: Cardiology

## 2016-11-29 ENCOUNTER — Encounter: Payer: Self-pay | Admitting: Cardiology

## 2016-11-29 ENCOUNTER — Ambulatory Visit (INDEPENDENT_AMBULATORY_CARE_PROVIDER_SITE_OTHER): Payer: Medicare Other | Admitting: Cardiology

## 2016-11-29 VITALS — BP 140/72 | HR 73 | Ht 71.0 in | Wt 165.0 lb

## 2016-11-29 DIAGNOSIS — I10 Essential (primary) hypertension: Secondary | ICD-10-CM

## 2016-11-29 DIAGNOSIS — I471 Supraventricular tachycardia: Secondary | ICD-10-CM

## 2016-11-29 DIAGNOSIS — E7800 Pure hypercholesterolemia, unspecified: Secondary | ICD-10-CM

## 2016-11-29 DIAGNOSIS — I2510 Atherosclerotic heart disease of native coronary artery without angina pectoris: Secondary | ICD-10-CM | POA: Diagnosis not present

## 2016-11-29 NOTE — Patient Instructions (Signed)
Medication Instructions:   NO CHANGE  Labwork:  Your physician recommends that you return for lab work WHEN CONVENIENT   Testing/Procedures:  A chest x-ray takes a picture of the organs and structures inside the chest, including the heart, lungs, and blood vessels. This test can show several things, including, whether the heart is enlarges; whether fluid is building up in the lungs; and whether pacemaker / defibrillator leads are still in place. AT [facility]  Follow-Up:  Your physician wants you to follow-up in: 6 MONTHS WITH DR Jens SomRENSHAW You will receive a reminder letter in the mail two months in advance. If you don't receive a letter, please call our office to schedule the follow-up appointment.   If you need a refill on your cardiac medications before your next appointment, please call your pharmacy.

## 2016-12-10 ENCOUNTER — Ambulatory Visit (HOSPITAL_COMMUNITY)
Admission: RE | Admit: 2016-12-10 | Discharge: 2016-12-10 | Disposition: A | Discharge status: Home / Self Care | Payer: Medicare Other | Source: Ambulatory Visit | Attending: Cardiology | Admitting: Cardiology

## 2016-12-10 ENCOUNTER — Telehealth: Payer: Self-pay | Admitting: *Deleted

## 2016-12-10 ENCOUNTER — Other Ambulatory Visit (HOSPITAL_COMMUNITY)
Admission: RE | Admit: 2016-12-10 | Discharge: 2016-12-10 | Disposition: A | Discharge status: Home / Self Care | Payer: Medicare Other | Source: Ambulatory Visit | Attending: Cardiology | Admitting: Cardiology

## 2016-12-10 DIAGNOSIS — I471 Supraventricular tachycardia: Secondary | ICD-10-CM

## 2016-12-10 DIAGNOSIS — R7989 Other specified abnormal findings of blood chemistry: Secondary | ICD-10-CM

## 2016-12-10 DIAGNOSIS — R918 Other nonspecific abnormal finding of lung field: Secondary | ICD-10-CM | POA: Diagnosis not present

## 2016-12-10 DIAGNOSIS — I4891 Unspecified atrial fibrillation: Secondary | ICD-10-CM | POA: Diagnosis not present

## 2016-12-10 LAB — TSH: TSH: 6.761 u[IU]/mL — ABNORMAL HIGH (ref 0.350–4.500)

## 2016-12-10 LAB — HEPATIC FUNCTION PANEL
ALK PHOS: 81 U/L (ref 38–126)
ALT: 18 U/L (ref 17–63)
AST: 26 U/L (ref 15–41)
Albumin: 3.8 g/dL (ref 3.5–5.0)
Bilirubin, Direct: 0.1 mg/dL (ref 0.1–0.5)
Indirect Bilirubin: 0.3 mg/dL (ref 0.3–0.9)
TOTAL PROTEIN: 6.4 g/dL — AB (ref 6.5–8.1)
Total Bilirubin: 0.4 mg/dL (ref 0.3–1.2)

## 2016-12-10 NOTE — Telephone Encounter (Signed)
-----   Message from Lewayne Bunting, MD sent at 12/10/2016 10:39 AM EDT ----- Check TSH, free T4 in 12 weeks  Olga Millers

## 2016-12-10 NOTE — Telephone Encounter (Signed)
pt aware of results and need for repeat labs. Lab orders mailed to the pt

## 2016-12-20 ENCOUNTER — Other Ambulatory Visit: Payer: Self-pay | Admitting: Cardiology

## 2017-01-10 ENCOUNTER — Other Ambulatory Visit: Payer: Self-pay | Admitting: Cardiology

## 2017-03-10 ENCOUNTER — Telehealth: Payer: Self-pay | Admitting: *Deleted

## 2017-03-10 ENCOUNTER — Other Ambulatory Visit (HOSPITAL_COMMUNITY)
Admission: RE | Admit: 2017-03-10 | Discharge: 2017-03-10 | Disposition: A | Discharge status: Home / Self Care | Payer: Medicare Other | Source: Ambulatory Visit | Attending: Cardiology | Admitting: Cardiology

## 2017-03-10 DIAGNOSIS — R946 Abnormal results of thyroid function studies: Secondary | ICD-10-CM | POA: Insufficient documentation

## 2017-03-10 LAB — T4, FREE: FREE T4: 0.86 ng/dL (ref 0.61–1.12)

## 2017-03-10 LAB — TSH: TSH: 7.353 u[IU]/mL — ABNORMAL HIGH (ref 0.350–4.500)

## 2017-03-10 NOTE — Telephone Encounter (Signed)
New Message ° ° pt verbalized that he is returning call for rn  °

## 2017-03-10 NOTE — Telephone Encounter (Signed)
Notes recorded by Lewayne Buntingrenshaw, Brian S, MD on 03/10/2017 at 1:38 PM EDT Continue present meds Olga MillersBrian Crenshaw

## 2017-03-10 NOTE — Telephone Encounter (Addendum)
-----   Message from Lewayne BuntingBrian S Crenshaw, MD sent at 03/10/2017 10:15 AM EDT ----- Check free T4 Olga MillersBrian Crenshaw, MD   Left message for pt to call

## 2017-03-10 NOTE — Telephone Encounter (Signed)
Pt.notified

## 2017-03-22 DIAGNOSIS — I2510 Atherosclerotic heart disease of native coronary artery without angina pectoris: Secondary | ICD-10-CM | POA: Diagnosis not present

## 2017-03-22 DIAGNOSIS — E1122 Type 2 diabetes mellitus with diabetic chronic kidney disease: Secondary | ICD-10-CM | POA: Diagnosis not present

## 2017-03-22 DIAGNOSIS — N183 Chronic kidney disease, stage 3 (moderate): Secondary | ICD-10-CM | POA: Diagnosis not present

## 2017-03-22 DIAGNOSIS — E119 Type 2 diabetes mellitus without complications: Secondary | ICD-10-CM | POA: Diagnosis not present

## 2017-03-22 DIAGNOSIS — I10 Essential (primary) hypertension: Secondary | ICD-10-CM | POA: Diagnosis not present

## 2017-05-31 NOTE — Progress Notes (Signed)
HPI: FU coronary artery disease. Patient is status post coronary artery bypass graft at [facility] in 1993. He was admitted to [facility] in January of 2014 with chest pain. He ruled in for a non-ST elevation myocardial infarction. Cardiac catheterization in January of 2014 showed an occluded left main, LAD, circumflex. There was a 95% RCA. LIMA to the LAD was patent. Saphenous vein graft to the obtuse marginal had a 95% lesion. Saphenous vein graft to the diagonal was patent. Saphenous vein graft to the RCA had a 99% stenosis. Patient had PCI of the saphenous vein graft to the right coronary artery and saphenous vein graft to the obtuse marginal with drug-eluting stents. Echocardiogram in January 2014 showed an ejection fraction of 45-50%. The patient also had SVT during hospitalization. He was seen by electrophysiology and placed on amiodarone. Carotid Dopplers March 2017 showed moderate atherosclerosis but no hemodynamically significant internal carotid artery stenosis bilaterally. Since last seen, he has some fatigue and dyspnea with more extreme activities. No orthopnea or PND. Minimal pedal edema. No chest pain, palpitations or syncope.  Current Outpatient Prescriptions  Medication Sig Dispense Refill  . amiodarone (PACERONE) 200 MG tablet TAKE ONE HALF TABLET BY MOUTH ONCE DAILY 90 tablet 3  . amLODipine (NORVASC) 5 MG tablet TAKE ONE TABLET BY MOUTH ONCE DAILY 90 tablet 3  . aspirin EC 81 MG EC tablet Take 1 tablet (81 mg total) by mouth daily.    Marland Kitchen atorvastatin (LIPITOR) 10 MG tablet Take 10 mg by mouth at bedtime.    Marland Kitchen doxazosin (CARDURA) 4 MG tablet Take 4 mg by mouth at bedtime.    . famotidine (PEPCID) 20 MG tablet Take 20 mg by mouth 2 (two) times daily.    Marland Kitchen losartan (COZAAR) 50 MG tablet Take 1 tablet (50 mg total) by mouth daily. 30 tablet 3  . metFORMIN (GLUCOPHAGE-XR) 500 MG 24 hr tablet Take 1 tablet by mouth daily.    . nitroGLYCERIN (NITROSTAT) 0.4 MG SL  tablet Place 1 tablet (0.4 mg total) under the tongue every 5 (five) minutes x 3 doses as needed for chest pain. 60 tablet 3   No current facility-administered medications for this visit.      Past Medical History:  Diagnosis Date  . Bifascicular block   . Coronary artery disease    CABG 1993 at [facility]; NSTEMI 09/2012 s/p DES to SVG-OM1, DES to SVG-PDA   . DM (diabetes mellitus) (HCC)   . HTN (hypertension)   . SVT (supraventricular tachycardia) (HCC)     Past Surgical History:  Procedure Laterality Date  . CARDIAC SURGERY     1993 CABG  . LEFT HEART CATHETERIZATION WITH CORONARY/GRAFT ANGIOGRAM N/A 10/04/2012   Procedure: LEFT HEART CATHETERIZATION WITH Isabel Caprice;  Surgeon: Tonny Bollman, MD;  Location: [facility] CATH LAB;  Service: Cardiovascular;  Laterality: N/A;    Social History   Social History  . Marital status: Married    Spouse name: N/A  . Number of children: N/A  . Years of education: N/A   Occupational History  . Not on file.   Social History Main Topics  . Smoking status: Never Smoker  . Smokeless tobacco: Never Used  . Alcohol use No  . Drug use: No  . Sexual activity: Not on file   Other Topics Concern  . Not on file   Social History Narrative  . No narrative on file    Family History  Problem Relation Age  of Onset  . CAD Unknown        multiple relatives including father and son    ROS: fatigue but no fevers or chills, productive cough, hemoptysis, dysphasia, odynophagia, melena, hematochezia, dysuria, hematuria, rash, seizure activity, orthopnea, PND, claudication. Remaining systems are negative.  Physical Exam: Well-developed well-nourished in no acute distress.  Skin is warm and dry.  HEENT is normal.  Neck is supple.  Chest is clear to auscultation with normal expansion.  Cardiovascular exam is regular rate and rhythm.  Abdominal exam nontender or distended. No masses palpated. Extremities show trace edema. neuro  grossly intact   A/P  1 Coronary artery disease-patient continues to do well from a symptomatic standpoint. Continue aspirin and statin.  2 supraventricular tachycardia-his symptoms are stable. No recent episodes of SVT. Continue amiodarone. Check TSH/free T4, liver functions and chest x-ray.  3 hypertension-blood pressure is controlled. Continue present medications. Check K and renal function.  4 hyperlipidemia-continue statin.  5 carotid artery disease-continue aspirin and statin.  Olga Millers, MD

## 2017-06-10 ENCOUNTER — Encounter: Payer: Self-pay | Admitting: Cardiology

## 2017-06-10 ENCOUNTER — Ambulatory Visit (INDEPENDENT_AMBULATORY_CARE_PROVIDER_SITE_OTHER): Payer: Medicare Other | Admitting: Cardiology

## 2017-06-10 VITALS — BP 130/70 | HR 68 | Ht 71.0 in | Wt 167.0 lb

## 2017-06-10 DIAGNOSIS — I25810 Atherosclerosis of coronary artery bypass graft(s) without angina pectoris: Secondary | ICD-10-CM

## 2017-06-10 DIAGNOSIS — I10 Essential (primary) hypertension: Secondary | ICD-10-CM | POA: Diagnosis not present

## 2017-06-10 DIAGNOSIS — E7800 Pure hypercholesterolemia, unspecified: Secondary | ICD-10-CM | POA: Diagnosis not present

## 2017-06-10 DIAGNOSIS — I471 Supraventricular tachycardia: Secondary | ICD-10-CM | POA: Diagnosis not present

## 2017-06-10 NOTE — Patient Instructions (Signed)
Medication Instructions:   NO CHANGE  Labwork:  Your physician recommends that you HAVE LAB WORK TODAY  Testing/Procedures:  A chest x-ray takes a picture of the organs and structures inside the chest, including the heart, lungs, and blood vessels. This test can show several things, including, whether the heart is enlarges; whether fluid is building up in the lungs; and whether pacemaker / defibrillator leads are still in place. AT [place]  Follow-Up:  Your physician wants you to follow-up in: 6 MONTHS WITH DR Jens Som You will receive a reminder letter in the mail two months in advance. If you don't receive a letter, please call our office to schedule the follow-up appointment.   If you need a refill on your cardiac medications before your next appointment, please call your pharmacy.

## 2017-06-11 LAB — BASIC METABOLIC PANEL
BUN/Creatinine Ratio: 16 (ref 10–24)
BUN: 21 mg/dL (ref 10–36)
CALCIUM: 8.9 mg/dL (ref 8.6–10.2)
CHLORIDE: 101 mmol/L (ref 96–106)
CO2: 22 mmol/L (ref 20–29)
Creatinine, Ser: 1.33 mg/dL — ABNORMAL HIGH (ref 0.76–1.27)
GFR calc non Af Amer: 46 mL/min/{1.73_m2} — ABNORMAL LOW (ref >59)
GFR, EST AFRICAN AMERICAN: 53 mL/min/{1.73_m2} — AB (ref >59)
Glucose: 131 mg/dL — ABNORMAL HIGH (ref 65–99)
Potassium: 4.9 mmol/L (ref 3.5–5.2)
SODIUM: 141 mmol/L (ref 134–144)

## 2017-06-11 LAB — HEPATIC FUNCTION PANEL
ALT: 14 IU/L (ref 0–44)
AST: 21 IU/L (ref 0–40)
Albumin: 4 g/dL (ref 3.2–4.6)
Alkaline Phosphatase: 85 IU/L (ref 39–117)
BILIRUBIN TOTAL: 0.4 mg/dL (ref 0.0–1.2)
BILIRUBIN, DIRECT: 0.12 mg/dL (ref 0.00–0.40)
Total Protein: 6.1 g/dL (ref 6.0–8.5)

## 2017-06-11 LAB — TSH: TSH: 8.64 u[IU]/mL — AB (ref 0.450–4.500)

## 2017-06-11 LAB — T4, FREE: Free T4: 1.05 ng/dL (ref 0.82–1.77)

## 2017-06-16 ENCOUNTER — Ambulatory Visit (INDEPENDENT_AMBULATORY_CARE_PROVIDER_SITE_OTHER): Payer: Medicare Other | Admitting: Otolaryngology

## 2017-06-16 ENCOUNTER — Ambulatory Visit (HOSPITAL_COMMUNITY)
Admission: RE | Admit: 2017-06-16 | Discharge: 2017-06-16 | Disposition: A | Discharge status: Home / Self Care | Payer: Medicare Other | Source: Ambulatory Visit | Attending: Cardiology | Admitting: Cardiology

## 2017-06-16 DIAGNOSIS — Z79899 Other long term (current) drug therapy: Secondary | ICD-10-CM | POA: Insufficient documentation

## 2017-06-16 DIAGNOSIS — H6122 Impacted cerumen, left ear: Secondary | ICD-10-CM

## 2017-06-16 DIAGNOSIS — I471 Supraventricular tachycardia: Secondary | ICD-10-CM | POA: Insufficient documentation

## 2017-06-16 DIAGNOSIS — Z951 Presence of aortocoronary bypass graft: Secondary | ICD-10-CM | POA: Insufficient documentation

## 2017-06-16 DIAGNOSIS — H900 Conductive hearing loss, bilateral: Secondary | ICD-10-CM | POA: Diagnosis not present

## 2017-06-16 DIAGNOSIS — J449 Chronic obstructive pulmonary disease, unspecified: Secondary | ICD-10-CM | POA: Insufficient documentation

## 2017-06-16 DIAGNOSIS — R918 Other nonspecific abnormal finding of lung field: Secondary | ICD-10-CM | POA: Diagnosis not present

## 2017-06-25 ENCOUNTER — Other Ambulatory Visit: Payer: Self-pay | Admitting: Cardiology

## 2017-06-25 DIAGNOSIS — I10 Essential (primary) hypertension: Secondary | ICD-10-CM

## 2017-07-11 DIAGNOSIS — N183 Chronic kidney disease, stage 3 (moderate): Secondary | ICD-10-CM | POA: Diagnosis not present

## 2017-07-11 DIAGNOSIS — E119 Type 2 diabetes mellitus without complications: Secondary | ICD-10-CM | POA: Diagnosis not present

## 2017-07-11 DIAGNOSIS — Z79899 Other long term (current) drug therapy: Secondary | ICD-10-CM | POA: Diagnosis not present

## 2017-07-11 DIAGNOSIS — I10 Essential (primary) hypertension: Secondary | ICD-10-CM | POA: Diagnosis not present

## 2017-07-11 DIAGNOSIS — Z23 Encounter for immunization: Secondary | ICD-10-CM | POA: Diagnosis not present

## 2017-12-31 ENCOUNTER — Other Ambulatory Visit: Payer: Self-pay | Admitting: Cardiology

## 2018-01-02 NOTE — Telephone Encounter (Signed)
Rx sent to pharmacy   

## 2018-02-11 ENCOUNTER — Other Ambulatory Visit: Payer: Self-pay | Admitting: Cardiology

## 2018-02-14 ENCOUNTER — Other Ambulatory Visit: Payer: Self-pay

## 2018-02-14 MED ORDER — FAMOTIDINE 20 MG PO TABS: 20.0000 mg | ORAL_TABLET | Freq: Two times a day (BID) | ORAL | 1 refills | Status: DC

## 2018-02-28 DIAGNOSIS — I471 Supraventricular tachycardia: Secondary | ICD-10-CM | POA: Diagnosis not present

## 2018-02-28 DIAGNOSIS — R7989 Other specified abnormal findings of blood chemistry: Secondary | ICD-10-CM | POA: Diagnosis not present

## 2018-02-28 DIAGNOSIS — Z79899 Other long term (current) drug therapy: Secondary | ICD-10-CM | POA: Diagnosis not present

## 2018-02-28 DIAGNOSIS — E119 Type 2 diabetes mellitus without complications: Secondary | ICD-10-CM | POA: Diagnosis not present

## 2018-04-05 DIAGNOSIS — R601 Generalized edema: Secondary | ICD-10-CM | POA: Diagnosis not present

## 2018-04-05 DIAGNOSIS — R7989 Other specified abnormal findings of blood chemistry: Secondary | ICD-10-CM | POA: Diagnosis not present

## 2018-04-11 NOTE — Progress Notes (Signed)
HPI: FU coronary artery disease. Patient is status post coronary artery bypass graft at [facility] in 1993. He was admitted to [facility] in January of 2014 with chest pain. He ruled in for a non-ST elevation myocardial infarction. Cardiac catheterization in January of 2014 showed an occluded left main, LAD, circumflex. There was a 95% RCA. LIMA to the LAD was patent. Saphenous vein graft to the obtuse marginal had a 95% lesion. Saphenous vein graft to the diagonal was patent. Saphenous vein graft to the RCA had a 99% stenosis. Patient had PCI of the saphenous vein graft to the right coronary artery and saphenous vein graft to the obtuse marginal with drug-eluting stents. Echocardiogram in January 2014 showed an ejection fraction of 45-50%. The patient also had SVT during hospitalization. He was seen by electrophysiology and placed on amiodarone. Carotid Dopplers March 2017 showed moderate atherosclerosis but no hemodynamically significant internal carotid artery stenosis bilaterally. Since last seen, patient denies dyspnea, chest pain, palpitations or syncope.  Current Outpatient Medications  Medication Sig Dispense Refill  . amiodarone (PACERONE) 200 MG tablet TAKE ONE HALF TABLET BY MOUTH ONCE DAILY 90 tablet 3  . amiodarone (PACERONE) 200 MG tablet Take one half tablet by mouth once daily. Please call to make appointment for further refills 60 tablet 0  . amLODipine (NORVASC) 5 MG tablet TAKE ONE TABLET BY MOUTH ONCE DAILY 90 tablet 3  . aspirin EC 81 MG EC tablet Take 1 tablet (81 mg total) by mouth daily.    Marland Kitchen atorvastatin (LIPITOR) 10 MG tablet Take 10 mg by mouth at bedtime.    Marland Kitchen doxazosin (CARDURA) 4 MG tablet Take 4 mg by mouth at bedtime.    . famotidine (PEPCID) 20 MG tablet Take 1 tablet (20 mg total) by mouth 2 (two) times daily. 180 tablet 1  . losartan (COZAAR) 50 MG tablet Take 1 tablet (50 mg total) by mouth daily. 30 tablet 3  . metFORMIN (GLUCOPHAGE-XR) 500 MG  24 hr tablet Take 1 tablet by mouth daily.    . nitroGLYCERIN (NITROSTAT) 0.4 MG SL tablet Place 1 tablet (0.4 mg total) under the tongue every 5 (five) minutes x 3 doses as needed for chest pain. 60 tablet 3   No current facility-administered medications for this visit.      Past Medical History:  Diagnosis Date  . Bifascicular block   . Coronary artery disease    CABG 1993 at [facility]; NSTEMI 09/2012 s/p DES to SVG-OM1, DES to SVG-PDA   . DM (diabetes mellitus) (HCC)   . HTN (hypertension)   . SVT (supraventricular tachycardia) (HCC)     Past Surgical History:  Procedure Laterality Date  . CARDIAC SURGERY     1993 CABG  . LEFT HEART CATHETERIZATION WITH CORONARY/GRAFT ANGIOGRAM N/A 10/04/2012   Procedure: LEFT HEART CATHETERIZATION WITH Isabel Caprice;  Surgeon: Tonny Bollman, MD;  Location: [facility] CATH LAB;  Service: Cardiovascular;  Laterality: N/A;    Social History   Socioeconomic History  . Marital status: Married    Spouse name: Not on file  . Number of children: Not on file  . Years of education: Not on file  . Highest education level: Not on file  Occupational History  . Not on file  Social Needs  . Financial resource strain: Not on file  . Food insecurity:    Worry: Not on file    Inability: Not on file  . Transportation needs:    Medical:  Not on file    Non-medical: Not on file  Tobacco Use  . Smoking status: Never Smoker  . Smokeless tobacco: Never Used  Substance and Sexual Activity  . Alcohol use: No    Alcohol/week: 0.0 standard drinks  . Drug use: No  . Sexual activity: Not on file  Lifestyle  . Physical activity:    Days per week: Not on file    Minutes per session: Not on file  . Stress: Not on file  Relationships  . Social connections:    Talks on phone: Not on file    Gets together: Not on file    Attends religious service: Not on file    Active member of club or organization: Not on file    Attends meetings of clubs or  organizations: Not on file    Relationship status: Not on file  . Intimate partner violence:    Fear of current or ex partner: Not on file    Emotionally abused: Not on file    Physically abused: Not on file    Forced sexual activity: Not on file  Other Topics Concern  . Not on file  Social History Narrative  . Not on file    Family History  Problem Relation Age of Onset  . CAD Unknown        multiple relatives including father and son    ROS: no fevers or chills, productive cough, hemoptysis, dysphasia, odynophagia, melena, hematochezia, dysuria, hematuria, rash, seizure activity, orthopnea, PND, pedal edema, claudication. Remaining systems are negative.  Physical Exam: Well-developed well-nourished in no acute distress.  Skin is warm and dry.  HEENT is normal.  Neck is supple.  Chest is clear to auscultation with normal expansion.  Cardiovascular exam is regular rate and rhythm.  Abdominal exam nontender or distended. No masses palpated. Extremities show trace edema. neuro grossly intact  ECG-sinus rhythm at a rate of 67.  Right bundle branch block.  Left anterior fascicular block.  Personally reviewed  A/P  1 coronary artery disease status post coronary artery bypass graft-patient continues to do well with no chest pain.  Plan to continue medical therapy including aspirin and statin.  2 supraventricular tachycardia-patient has had no recent episodes of SVT.  Continue amiodarone at present dose.  Check TSH, liver functions and chest x-ray.  3 hypertension-blood pressure is controlled.  Continue present medications. Check BMET.  4 hyperlipidemia-continue statin.  Check lipids.  5 carotid artery disease-mild on most recent Dopplers.  Continue medical therapy including aspirin and statin.  Olga MillersBrian Crenshaw, MD

## 2018-04-14 ENCOUNTER — Encounter: Payer: Self-pay | Admitting: Cardiology

## 2018-04-14 ENCOUNTER — Ambulatory Visit (INDEPENDENT_AMBULATORY_CARE_PROVIDER_SITE_OTHER): Payer: Medicare Other | Admitting: Cardiology

## 2018-04-14 ENCOUNTER — Telehealth: Payer: Self-pay | Admitting: *Deleted

## 2018-04-14 VITALS — BP 120/68 | HR 67 | Ht 71.0 in | Wt 162.0 lb

## 2018-04-14 DIAGNOSIS — E7800 Pure hypercholesterolemia, unspecified: Secondary | ICD-10-CM | POA: Diagnosis not present

## 2018-04-14 DIAGNOSIS — R7989 Other specified abnormal findings of blood chemistry: Secondary | ICD-10-CM | POA: Diagnosis not present

## 2018-04-14 DIAGNOSIS — I25810 Atherosclerosis of coronary artery bypass graft(s) without angina pectoris: Secondary | ICD-10-CM

## 2018-04-14 DIAGNOSIS — I471 Supraventricular tachycardia: Secondary | ICD-10-CM | POA: Diagnosis not present

## 2018-04-14 DIAGNOSIS — I10 Essential (primary) hypertension: Secondary | ICD-10-CM | POA: Diagnosis not present

## 2018-04-14 DIAGNOSIS — E875 Hyperkalemia: Secondary | ICD-10-CM

## 2018-04-14 MED ORDER — AMIODARONE HCL 200 MG PO TABS: ORAL_TABLET | ORAL | 3 refills | Status: DC

## 2018-04-14 NOTE — Telephone Encounter (Signed)
-----   Message from Lewayne BuntingBrian S Crenshaw, MD sent at 04/14/2018  5:06 PM EDT ----- Hold cozaar; Low K diet; recheck K Monday 04/17/18 Olga MillersBrian Crenshaw

## 2018-04-14 NOTE — Telephone Encounter (Signed)
Spoke with pt, Aware of dr crenshaw's recommendations. Lab orders placed. 

## 2018-04-14 NOTE — Patient Instructions (Signed)
Medication Instructions:   NO CHANGE  Labwork:  Your physician recommends that you HAVE LAB WORK TODAY  Testing/Procedures:  A chest x-ray takes a picture of the organs and structures inside the chest, including the heart, lungs, and blood vessels. This test can show several things, including, whether the heart is enlarges; whether fluid is building up in the lungs; and whether pacemaker / defibrillator leads are still in place.   Follow-Up:  Your physician wants you to follow-up in: 6 MONTHS WITH DR CRENSHAW You will receive a reminder letter in the mail two months in advance. If you don't receive a letter, please call our office to schedule the follow-up appointment.   If you need a refill on your cardiac medications before your next appointment, please call your pharmacy.    

## 2018-04-15 LAB — COMPREHENSIVE METABOLIC PANEL
ALT: 13 IU/L (ref 0–44)
AST: 18 IU/L (ref 0–40)
Albumin/Globulin Ratio: 2.7 — ABNORMAL HIGH (ref 1.2–2.2)
Albumin: 4 g/dL (ref 3.2–4.6)
Alkaline Phosphatase: 73 IU/L (ref 39–117)
BUN/Creatinine Ratio: 20 (ref 10–24)
BUN: 27 mg/dL (ref 10–36)
Bilirubin Total: 0.4 mg/dL (ref 0.0–1.2)
CALCIUM: 9.5 mg/dL (ref 8.6–10.2)
CO2: 23 mmol/L (ref 20–29)
Chloride: 106 mmol/L (ref 96–106)
Creatinine, Ser: 1.33 mg/dL — ABNORMAL HIGH (ref 0.76–1.27)
GFR, EST AFRICAN AMERICAN: 53 mL/min/{1.73_m2} — AB (ref >59)
GFR, EST NON AFRICAN AMERICAN: 45 mL/min/{1.73_m2} — AB (ref >59)
GLUCOSE: 136 mg/dL — AB (ref 65–99)
Globulin, Total: 1.5 g/dL (ref 1.5–4.5)
Potassium: 5.6 mmol/L — ABNORMAL HIGH (ref 3.5–5.2)
Sodium: 141 mmol/L (ref 134–144)
TOTAL PROTEIN: 5.5 g/dL — AB (ref 6.0–8.5)

## 2018-04-15 LAB — TSH: TSH: 5.53 u[IU]/mL — ABNORMAL HIGH (ref 0.450–4.500)

## 2018-04-15 LAB — LIPID PANEL
CHOL/HDL RATIO: 2 ratio (ref 0.0–5.0)
Cholesterol, Total: 132 mg/dL (ref 100–199)
HDL: 66 mg/dL (ref >39)
LDL Calculated: 52 mg/dL (ref 0–99)
Triglycerides: 68 mg/dL (ref 0–149)
VLDL Cholesterol Cal: 14 mg/dL (ref 5–40)

## 2018-04-17 ENCOUNTER — Ambulatory Visit (HOSPITAL_COMMUNITY)
Admission: RE | Admit: 2018-04-17 | Discharge: 2018-04-17 | Disposition: A | Discharge status: Home / Self Care | Payer: Medicare Other | Source: Ambulatory Visit | Attending: Cardiology | Admitting: Cardiology

## 2018-04-17 DIAGNOSIS — I25810 Atherosclerosis of coronary artery bypass graft(s) without angina pectoris: Secondary | ICD-10-CM | POA: Insufficient documentation

## 2018-04-17 DIAGNOSIS — I700 Atherosclerosis of aorta: Secondary | ICD-10-CM | POA: Insufficient documentation

## 2018-04-21 DIAGNOSIS — E875 Hyperkalemia: Secondary | ICD-10-CM | POA: Diagnosis not present

## 2018-04-22 LAB — BASIC METABOLIC PANEL
BUN/Creatinine Ratio: 16 (ref 10–24)
BUN: 22 mg/dL (ref 10–36)
CALCIUM: 9.3 mg/dL (ref 8.6–10.2)
CO2: 23 mmol/L (ref 20–29)
CREATININE: 1.38 mg/dL — AB (ref 0.76–1.27)
Chloride: 106 mmol/L (ref 96–106)
GFR calc Af Amer: 50 mL/min/{1.73_m2} — ABNORMAL LOW (ref >59)
GFR, EST NON AFRICAN AMERICAN: 43 mL/min/{1.73_m2} — AB (ref >59)
GLUCOSE: 151 mg/dL — AB (ref 65–99)
Potassium: 4.6 mmol/L (ref 3.5–5.2)
Sodium: 140 mmol/L (ref 134–144)

## 2018-06-13 DIAGNOSIS — Z23 Encounter for immunization: Secondary | ICD-10-CM | POA: Diagnosis not present

## 2018-08-13 IMAGING — DX DG CHEST 2V
2 series · 2 of 2 positions shown · non-contrast
Comparison: 12/10/2016 .

CLINICAL DATA: Amiodarone treatment.  Surveillance exam .

EXAM:
CHEST  2 VIEW

[chest pa]
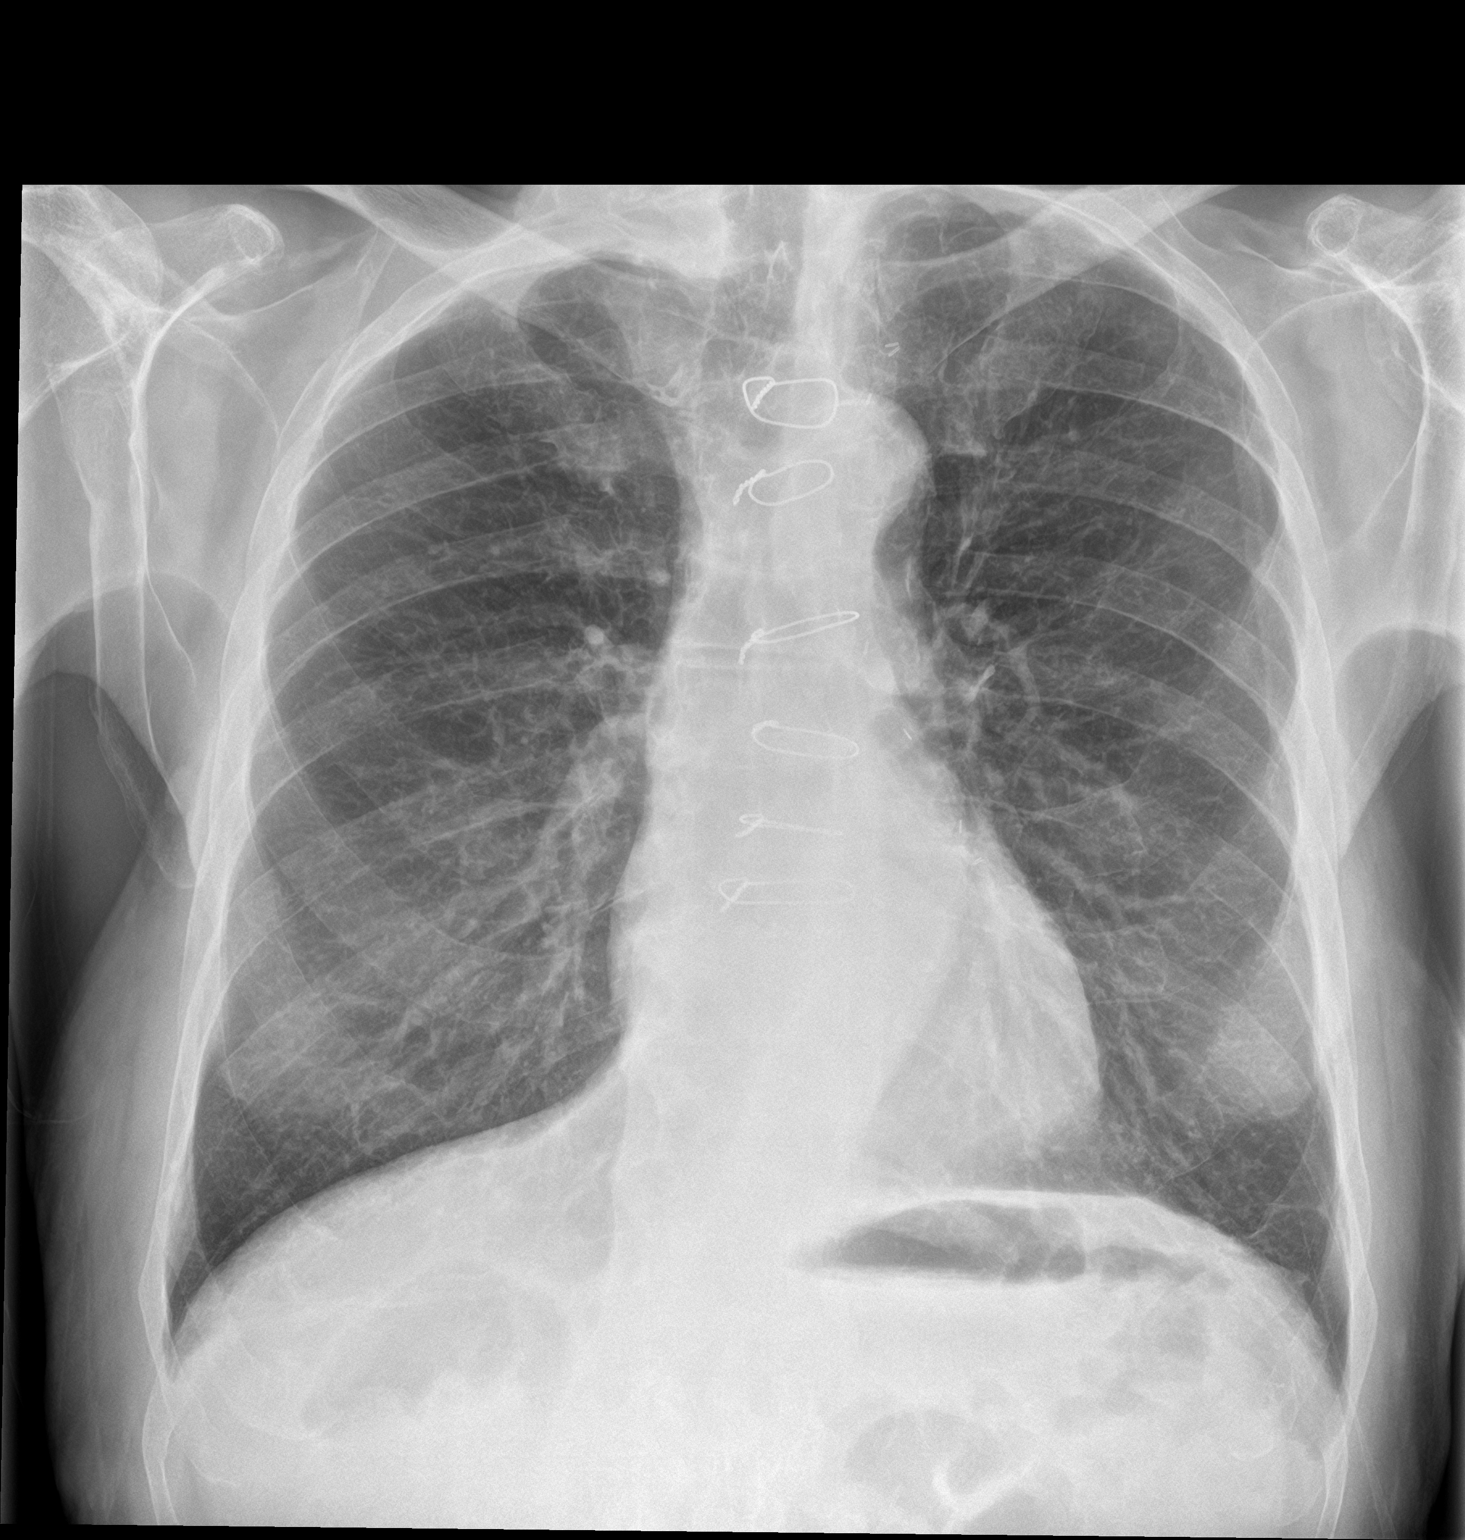

[chest lat]
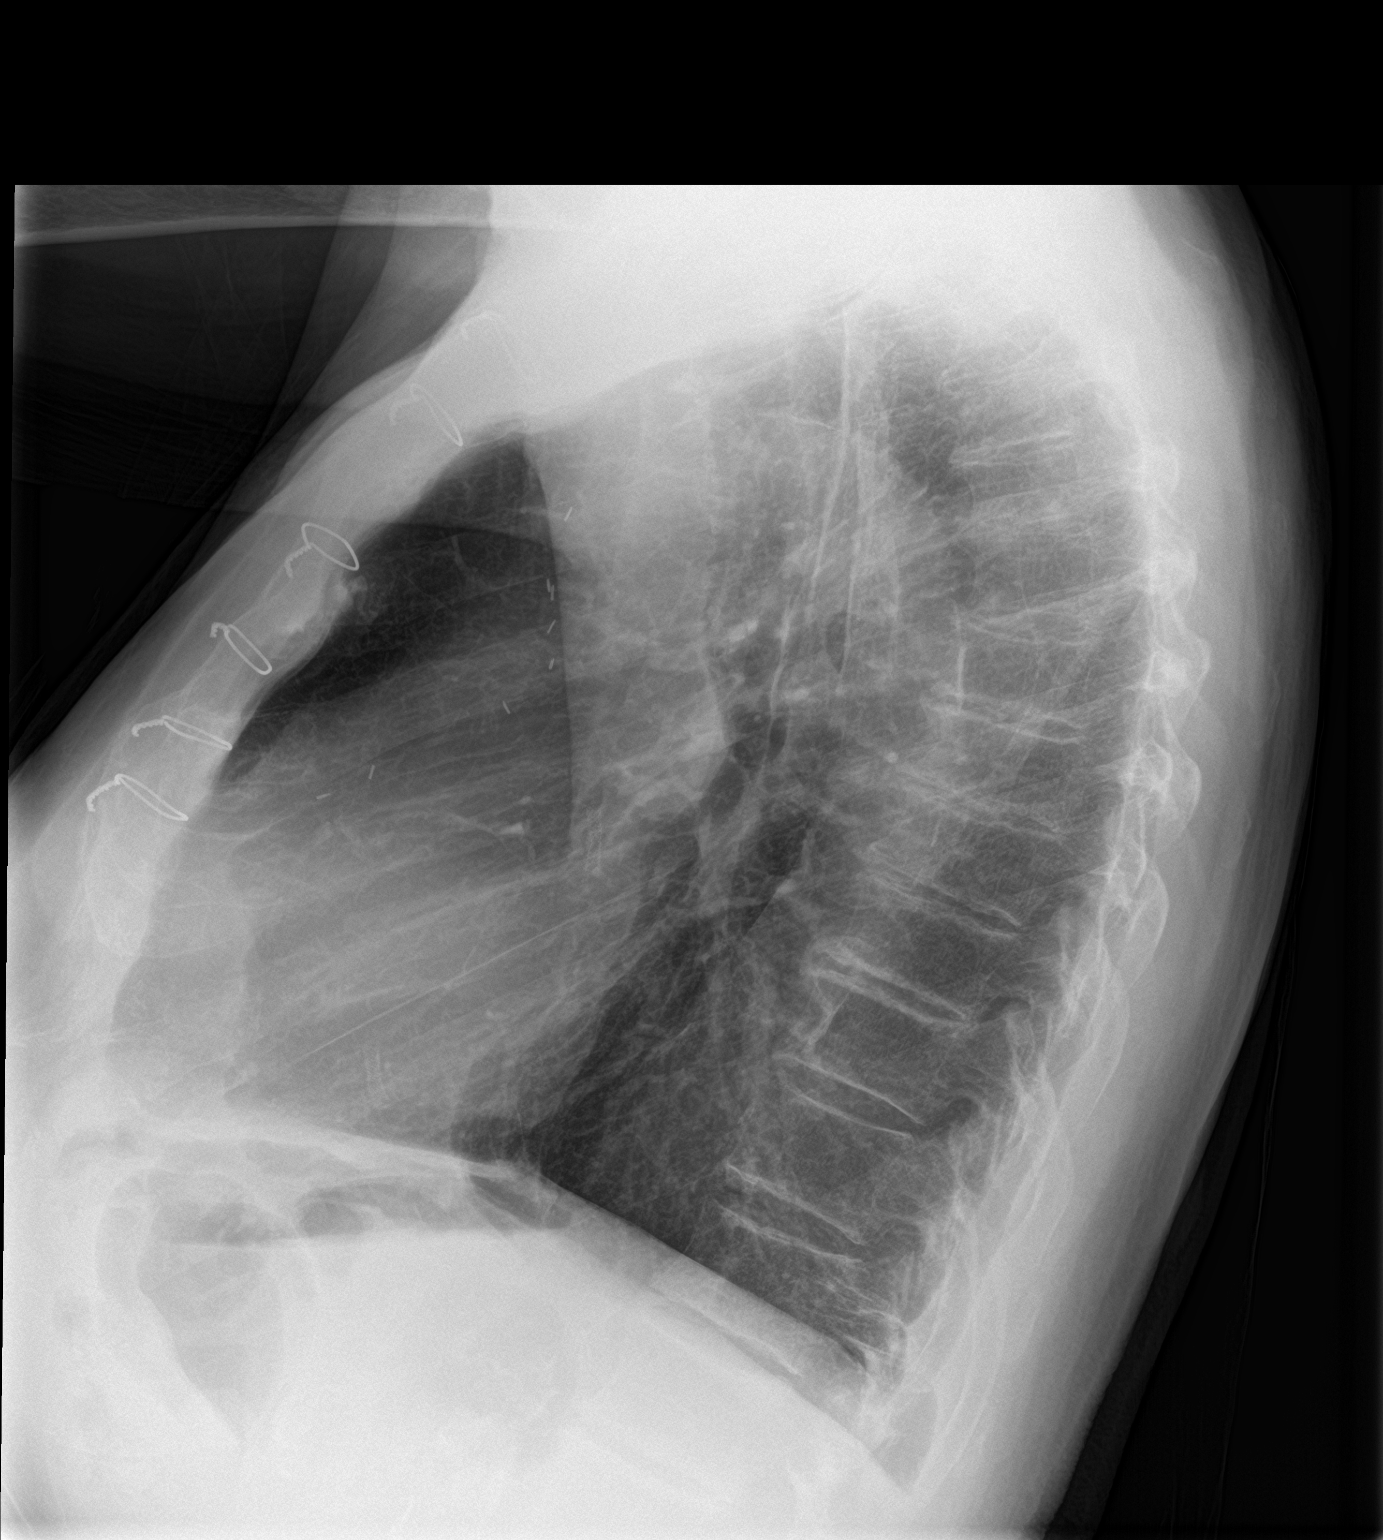

[2 of 2 positions shown; findings below may reference images not displayed]

FINDINGS: Mediastinum hilar structures are normal. Prior median sternotomy and
CABG. Heart size stable. No pulmonary venous congestion. Stable mild
interstitial prominence. COPD P Biapical pleural thickening again
noted consistent scarring . No pleural effusion or pneumothorax.
IMPRESSION: 1. Prior CABG.  Heart size stable.

2. Stable mild bilateral interstitial prominence. COPD. No acute
pulmonary abnormality identified . Chest is stable from prior exam.

## 2018-08-28 ENCOUNTER — Telehealth: Payer: Self-pay | Admitting: Cardiology

## 2018-08-28 NOTE — Telephone Encounter (Signed)
Spoke with pt's son and advised that it seems the lump would not be related to his heart and to contact pcp to have them assess. Son states he is concerned that it could be related to when pt had his CABG. Will route to MD and reintegrated to contact pcp as well. Son verbalized understanding.

## 2018-08-28 NOTE — Telephone Encounter (Signed)
New Message    Pt son called saying a quarter size lump appeared on the heart side above the rib cage. Noticed 2 weeks ago, nonpainful, no itching, no sores, could it be from his stent? Please call

## 2018-08-30 NOTE — Telephone Encounter (Signed)
Doubt related to prior CABG; [facility]

## 2018-09-01 NOTE — Telephone Encounter (Signed)
Spoke with pt son, he has an appointment with his medical doctor Tuesday next week.

## 2018-09-05 DIAGNOSIS — Z13220 Encounter for screening for lipoid disorders: Secondary | ICD-10-CM | POA: Diagnosis not present

## 2018-09-05 DIAGNOSIS — R7302 Impaired glucose tolerance (oral): Secondary | ICD-10-CM | POA: Diagnosis not present

## 2018-09-05 DIAGNOSIS — Z136 Encounter for screening for cardiovascular disorders: Secondary | ICD-10-CM | POA: Diagnosis not present

## 2018-09-05 DIAGNOSIS — Z125 Encounter for screening for malignant neoplasm of prostate: Secondary | ICD-10-CM | POA: Diagnosis not present

## 2018-09-05 DIAGNOSIS — E119 Type 2 diabetes mellitus without complications: Secondary | ICD-10-CM | POA: Diagnosis not present

## 2018-09-05 DIAGNOSIS — Z0000 Encounter for general adult medical examination without abnormal findings: Secondary | ICD-10-CM | POA: Diagnosis not present

## 2018-10-13 NOTE — Progress Notes (Signed)
HPI: FU coronary artery disease. Patient is status post coronary artery bypass graft at [facility] in 1993. He was admitted to [facility] in January of 2014 with chest pain. He ruled in for a non-ST elevation myocardial infarction. Cardiac catheterization in January of 2014 showed an occluded left main, LAD, circumflex. There was a 95% RCA. LIMA to the LAD was patent. Saphenous vein graft to theobtusemarginal had a 95% lesion. Saphenous vein graft to the diagonal was patent. Saphenous vein graft to the RCA had a 99% stenosis. Patient had PCI of the saphenous vein graft to the right coronary artery and saphenous vein graft to the obtuse marginal with drug-eluting stents. Echocardiogram in January 2014 showed an ejection fraction of 45-50%. The patient also had SVT during hospitalization. He was seen by electrophysiology and placed on amiodarone. Carotid Dopplers March 2017 showed moderate atherosclerosis but no hemodynamically significant internal carotid artery stenosis bilaterally. Since last seen,  patient denies dyspnea, chest pain, palpitations or syncope.  Current Outpatient Medications  Medication Sig Dispense Refill  . amiodarone (PACERONE) 200 MG tablet TAKE ONE HALF TABLET BY MOUTH ONCE DAILY 90 tablet 3  . amLODipine (NORVASC) 5 MG tablet TAKE ONE TABLET BY MOUTH ONCE DAILY 90 tablet 3  . aspirin EC 81 MG EC tablet Take 1 tablet (81 mg total) by mouth daily.    Marland Kitchen atorvastatin (LIPITOR) 10 MG tablet Take 10 mg by mouth at bedtime.    Marland Kitchen doxazosin (CARDURA) 4 MG tablet Take 4 mg by mouth at bedtime.    . famotidine (PEPCID) 20 MG tablet Take 1 tablet (20 mg total) by mouth 2 (two) times daily. (Patient taking differently: Take 20 mg by mouth daily. ) 180 tablet 1  . losartan (COZAAR) 50 MG tablet Take 1 tablet (50 mg total) by mouth daily. 30 tablet 3  . metFORMIN (GLUCOPHAGE-XR) 500 MG 24 hr tablet Take 1 tablet by mouth daily.    . nitroGLYCERIN (NITROSTAT) 0.4 MG SL  tablet Place 1 tablet (0.4 mg total) under the tongue every 5 (five) minutes x 3 doses as needed for chest pain. 60 tablet 3   No current facility-administered medications for this visit.      Past Medical History:  Diagnosis Date  . Bifascicular block   . Coronary artery disease    CABG 1993 at [facility]; NSTEMI 09/2012 s/p DES to SVG-OM1, DES to SVG-PDA   . DM (diabetes mellitus) (HCC)   . HTN (hypertension)   . SVT (supraventricular tachycardia) (HCC)     Past Surgical History:  Procedure Laterality Date  . CARDIAC SURGERY     1993 CABG  . LEFT HEART CATHETERIZATION WITH CORONARY/GRAFT ANGIOGRAM N/A 10/04/2012   Procedure: LEFT HEART CATHETERIZATION WITH Isabel Caprice;  Surgeon: Tonny Bollman, MD;  Location: [facility] CATH LAB;  Service: Cardiovascular;  Laterality: N/A;    Social History   Socioeconomic History  . Marital status: Married    Spouse name: Not on file  . Number of children: Not on file  . Years of education: Not on file  . Highest education level: Not on file  Occupational History  . Not on file  Social Needs  . Financial resource strain: Not on file  . Food insecurity:    Worry: Not on file    Inability: Not on file  . Transportation needs:    Medical: Not on file    Non-medical: Not on file  Tobacco Use  . Smoking status:  Never Smoker  . Smokeless tobacco: Never Used  Substance and Sexual Activity  . Alcohol use: No    Alcohol/week: 0.0 standard drinks  . Drug use: No  . Sexual activity: Not on file  Lifestyle  . Physical activity:    Days per week: Not on file    Minutes per session: Not on file  . Stress: Not on file  Relationships  . Social connections:    Talks on phone: Not on file    Gets together: Not on file    Attends religious service: Not on file    Active member of club or organization: Not on file    Attends meetings of clubs or organizations: Not on file    Relationship status: Not on file  . Intimate partner  violence:    Fear of current or ex partner: Not on file    Emotionally abused: Not on file    Physically abused: Not on file    Forced sexual activity: Not on file  Other Topics Concern  . Not on file  Social History Narrative  . Not on file    Family History  Problem Relation Age of Onset  . CAD Unknown        multiple relatives including father and son    ROS: Knee arthralgias but no fevers or chills, productive cough, hemoptysis, dysphasia, odynophagia, melena, hematochezia, dysuria, hematuria, rash, seizure activity, orthopnea, PND, pedal edema, claudication. Remaining systems are negative.  Physical Exam: Well-developed well-nourished in no acute distress.  Skin is warm and dry.  HEENT is normal.  Neck is supple.  Chest is clear to auscultation with normal expansion.  Cardiovascular exam is regular rate and rhythm.  Abdominal exam nontender or distended. No masses palpated. Extremities show trace edema. neuro grossly intact   A/P  1 coronary artery disease status post coronary artery bypass graft-patient continues to do well.  He denies chest pain.  Continue medical therapy with aspirin and statin.  I would like to be conservative given patient's age.  2 SVT-no recurrent episodes by report.  Plan to continue amiodarone at present dose.  We will check PA and lateral chest x-ray, TSH and liver functions.  3 hypertension-patient's blood pressure is controlled.  Continue present medications and follow.  Check potassium and renal function.  4 hyperlipidemia-continue statin.  5 carotid artery disease-mild on most recent Dopplers.  Plan medical therapy with aspirin and statin.  Olga Millers, MD

## 2018-10-19 ENCOUNTER — Ambulatory Visit (INDEPENDENT_AMBULATORY_CARE_PROVIDER_SITE_OTHER): Payer: Medicare Other | Admitting: Cardiology

## 2018-10-19 ENCOUNTER — Encounter: Payer: Self-pay | Admitting: Cardiology

## 2018-10-19 VITALS — BP 160/60 | HR 70 | Ht 71.0 in | Wt 159.6 lb

## 2018-10-19 DIAGNOSIS — I2510 Atherosclerotic heart disease of native coronary artery without angina pectoris: Secondary | ICD-10-CM

## 2018-10-19 DIAGNOSIS — I471 Supraventricular tachycardia: Secondary | ICD-10-CM

## 2018-10-19 DIAGNOSIS — I10 Essential (primary) hypertension: Secondary | ICD-10-CM

## 2018-10-19 DIAGNOSIS — E7800 Pure hypercholesterolemia, unspecified: Secondary | ICD-10-CM | POA: Diagnosis not present

## 2018-10-19 NOTE — Patient Instructions (Signed)
Medication Instructions:  NO CHANGE If you need a refill on your cardiac medications before your next appointment, please call your pharmacy.   Lab work: Your physician recommends that you HAVE LAB WORK TODAY If you have labs (blood work) drawn today and your tests are completely normal, you will receive your results only by: Marland Kitchen MyChart Message (if you have MyChart) OR . A paper copy in the mail If you have any lab test that is abnormal or we need to change your treatment, we will call you to review the results.  Testing/Procedures: A chest x-ray takes a picture of the organs and structures inside the chest, including the heart, lungs, and blood vessels. This test can show several things, including, whether the heart is enlarges; whether fluid is building up in the lungs; and whether pacemaker / defibrillator leads are still in place. [facility]  Follow-Up: At [facility], you and your health needs are our priority.  As part of our continuing mission to provide you with exceptional heart care, we have created designated Provider Care Teams.  These Care Teams include your primary Cardiologist (physician) and Advanced Practice Providers (APPs -  Physician Assistants and Nurse Practitioners) who all work together to provide you with the care you need, when you need it. You will need a follow up appointment in 6 months.  Please call our office 2 months in advance to schedule this appointment.  You may see Olga Millers MD or one of the following Advanced Practice Providers on your designated Care Team:   Corine Shelter, PA-C Judy Pimple, [place] . Marjie Skiff, PA-C  CALL IN June TO SCHEDULE APPOINTMENT IN [place]

## 2018-10-20 ENCOUNTER — Telehealth: Payer: Self-pay | Admitting: *Deleted

## 2018-10-20 DIAGNOSIS — R7989 Other specified abnormal findings of blood chemistry: Secondary | ICD-10-CM

## 2018-10-20 DIAGNOSIS — I471 Supraventricular tachycardia: Secondary | ICD-10-CM

## 2018-10-20 LAB — COMPREHENSIVE METABOLIC PANEL
ALBUMIN: 3.9 g/dL (ref 3.5–4.6)
ALK PHOS: 76 IU/L (ref 39–117)
ALT: 13 IU/L (ref 0–44)
AST: 20 IU/L (ref 0–40)
Albumin/Globulin Ratio: 2.2 (ref 1.2–2.2)
BILIRUBIN TOTAL: 0.3 mg/dL (ref 0.0–1.2)
BUN / CREAT RATIO: 20 (ref 10–24)
BUN: 23 mg/dL (ref 10–36)
CO2: 22 mmol/L (ref 20–29)
CREATININE: 1.15 mg/dL (ref 0.76–1.27)
Calcium: 9.3 mg/dL (ref 8.6–10.2)
Chloride: 103 mmol/L (ref 96–106)
GFR calc Af Amer: 63 mL/min/{1.73_m2} (ref >59)
GFR calc non Af Amer: 54 mL/min/{1.73_m2} — ABNORMAL LOW (ref >59)
GLOBULIN, TOTAL: 1.8 g/dL (ref 1.5–4.5)
Glucose: 111 mg/dL — ABNORMAL HIGH (ref 65–99)
Potassium: 5 mmol/L (ref 3.5–5.2)
SODIUM: 138 mmol/L (ref 134–144)
Total Protein: 5.7 g/dL — ABNORMAL LOW (ref 6.0–8.5)

## 2018-10-20 LAB — CBC
HEMATOCRIT: 33.2 % — AB (ref 37.5–51.0)
HEMOGLOBIN: 11.4 g/dL — AB (ref 13.0–17.7)
MCH: 31.1 pg (ref 26.6–33.0)
MCHC: 34.3 g/dL (ref 31.5–35.7)
MCV: 91 fL (ref 79–97)
Platelets: 165 10*3/uL (ref 150–450)
RBC: 3.67 x10E6/uL — ABNORMAL LOW (ref 4.14–5.80)
RDW: 11.8 % (ref 11.6–15.4)
WBC: 5.3 10*3/uL (ref 3.4–10.8)

## 2018-10-20 LAB — TSH: TSH: 22.17 u[IU]/mL — ABNORMAL HIGH (ref 0.450–4.500)

## 2018-10-20 MED ORDER — LEVOTHYROXINE SODIUM 25 MCG PO TABS: 25.0000 ug | ORAL_TABLET | Freq: Every day | ORAL | 3 refills | Status: DC

## 2018-10-20 NOTE — Telephone Encounter (Signed)
-----   Message from Lewayne Bunting, MD sent at 10/20/2018  7:11 AM EST ----- Add synthroid 25 micrograms daily; TSH 12 weeks Olga Millers

## 2018-10-20 NOTE — Telephone Encounter (Signed)
Spoke with pt, Aware of dr crenshaw's recommendations. New script sent to the pharmacy and Lab orders mailed to the pt  

## 2018-10-26 ENCOUNTER — Ambulatory Visit (HOSPITAL_COMMUNITY)
Admission: RE | Admit: 2018-10-26 | Discharge: 2018-10-26 | Disposition: A | Discharge status: Home / Self Care | Payer: Medicare Other | Source: Ambulatory Visit | Attending: Cardiology | Admitting: Cardiology

## 2018-10-26 DIAGNOSIS — I471 Supraventricular tachycardia: Secondary | ICD-10-CM | POA: Insufficient documentation

## 2019-02-13 DIAGNOSIS — Z6822 Body mass index (BMI) 22.0-22.9, adult: Secondary | ICD-10-CM | POA: Diagnosis not present

## 2019-02-13 DIAGNOSIS — I10 Essential (primary) hypertension: Secondary | ICD-10-CM | POA: Diagnosis not present

## 2019-02-13 DIAGNOSIS — E1140 Type 2 diabetes mellitus with diabetic neuropathy, unspecified: Secondary | ICD-10-CM | POA: Diagnosis not present

## 2019-02-13 DIAGNOSIS — M159 Polyosteoarthritis, unspecified: Secondary | ICD-10-CM | POA: Diagnosis not present

## 2019-02-13 DIAGNOSIS — Z951 Presence of aortocoronary bypass graft: Secondary | ICD-10-CM | POA: Diagnosis not present

## 2019-02-13 DIAGNOSIS — M7500 Adhesive capsulitis of unspecified shoulder: Secondary | ICD-10-CM | POA: Diagnosis not present

## 2019-02-15 DIAGNOSIS — R270 Ataxia, unspecified: Secondary | ICD-10-CM | POA: Diagnosis not present

## 2019-02-15 DIAGNOSIS — Z7984 Long term (current) use of oral hypoglycemic drugs: Secondary | ICD-10-CM | POA: Diagnosis not present

## 2019-02-15 DIAGNOSIS — M170 Bilateral primary osteoarthritis of knee: Secondary | ICD-10-CM | POA: Diagnosis not present

## 2019-02-15 DIAGNOSIS — Z9181 History of falling: Secondary | ICD-10-CM | POA: Diagnosis not present

## 2019-02-15 DIAGNOSIS — M19012 Primary osteoarthritis, left shoulder: Secondary | ICD-10-CM | POA: Diagnosis not present

## 2019-02-15 DIAGNOSIS — M7500 Adhesive capsulitis of unspecified shoulder: Secondary | ICD-10-CM | POA: Diagnosis not present

## 2019-02-15 DIAGNOSIS — E1140 Type 2 diabetes mellitus with diabetic neuropathy, unspecified: Secondary | ICD-10-CM | POA: Diagnosis not present

## 2019-02-15 DIAGNOSIS — Z951 Presence of aortocoronary bypass graft: Secondary | ICD-10-CM | POA: Diagnosis not present

## 2019-02-15 DIAGNOSIS — Z7982 Long term (current) use of aspirin: Secondary | ICD-10-CM | POA: Diagnosis not present

## 2019-02-15 DIAGNOSIS — I10 Essential (primary) hypertension: Secondary | ICD-10-CM | POA: Diagnosis not present

## 2019-02-19 DIAGNOSIS — M1712 Unilateral primary osteoarthritis, left knee: Secondary | ICD-10-CM | POA: Diagnosis not present

## 2019-02-19 DIAGNOSIS — I4891 Unspecified atrial fibrillation: Secondary | ICD-10-CM | POA: Diagnosis not present

## 2019-02-19 DIAGNOSIS — M1711 Unilateral primary osteoarthritis, right knee: Secondary | ICD-10-CM | POA: Diagnosis not present

## 2019-02-19 DIAGNOSIS — Z6822 Body mass index (BMI) 22.0-22.9, adult: Secondary | ICD-10-CM | POA: Diagnosis not present

## 2019-02-19 DIAGNOSIS — Z951 Presence of aortocoronary bypass graft: Secondary | ICD-10-CM | POA: Diagnosis not present

## 2019-02-19 DIAGNOSIS — E1140 Type 2 diabetes mellitus with diabetic neuropathy, unspecified: Secondary | ICD-10-CM | POA: Diagnosis not present

## 2019-02-19 DIAGNOSIS — M170 Bilateral primary osteoarthritis of knee: Secondary | ICD-10-CM | POA: Diagnosis not present

## 2019-02-19 DIAGNOSIS — M19012 Primary osteoarthritis, left shoulder: Secondary | ICD-10-CM | POA: Diagnosis not present

## 2019-02-19 DIAGNOSIS — I10 Essential (primary) hypertension: Secondary | ICD-10-CM | POA: Diagnosis not present

## 2019-02-19 DIAGNOSIS — R351 Nocturia: Secondary | ICD-10-CM | POA: Diagnosis not present

## 2019-02-21 DIAGNOSIS — M7500 Adhesive capsulitis of unspecified shoulder: Secondary | ICD-10-CM | POA: Diagnosis not present

## 2019-02-21 DIAGNOSIS — I10 Essential (primary) hypertension: Secondary | ICD-10-CM | POA: Diagnosis not present

## 2019-02-21 DIAGNOSIS — M170 Bilateral primary osteoarthritis of knee: Secondary | ICD-10-CM | POA: Diagnosis not present

## 2019-02-21 DIAGNOSIS — E1140 Type 2 diabetes mellitus with diabetic neuropathy, unspecified: Secondary | ICD-10-CM | POA: Diagnosis not present

## 2019-02-21 DIAGNOSIS — R270 Ataxia, unspecified: Secondary | ICD-10-CM | POA: Diagnosis not present

## 2019-02-21 DIAGNOSIS — M19012 Primary osteoarthritis, left shoulder: Secondary | ICD-10-CM | POA: Diagnosis not present

## 2019-02-26 DIAGNOSIS — E1140 Type 2 diabetes mellitus with diabetic neuropathy, unspecified: Secondary | ICD-10-CM | POA: Diagnosis not present

## 2019-02-26 DIAGNOSIS — R270 Ataxia, unspecified: Secondary | ICD-10-CM | POA: Diagnosis not present

## 2019-02-26 DIAGNOSIS — I4891 Unspecified atrial fibrillation: Secondary | ICD-10-CM | POA: Diagnosis not present

## 2019-02-26 DIAGNOSIS — I10 Essential (primary) hypertension: Secondary | ICD-10-CM | POA: Diagnosis not present

## 2019-02-26 DIAGNOSIS — M19012 Primary osteoarthritis, left shoulder: Secondary | ICD-10-CM | POA: Diagnosis not present

## 2019-02-26 DIAGNOSIS — M170 Bilateral primary osteoarthritis of knee: Secondary | ICD-10-CM | POA: Diagnosis not present

## 2019-02-26 DIAGNOSIS — M7500 Adhesive capsulitis of unspecified shoulder: Secondary | ICD-10-CM | POA: Diagnosis not present

## 2019-02-28 DIAGNOSIS — M7500 Adhesive capsulitis of unspecified shoulder: Secondary | ICD-10-CM | POA: Diagnosis not present

## 2019-02-28 DIAGNOSIS — E1140 Type 2 diabetes mellitus with diabetic neuropathy, unspecified: Secondary | ICD-10-CM | POA: Diagnosis not present

## 2019-02-28 DIAGNOSIS — I10 Essential (primary) hypertension: Secondary | ICD-10-CM | POA: Diagnosis not present

## 2019-02-28 DIAGNOSIS — R270 Ataxia, unspecified: Secondary | ICD-10-CM | POA: Diagnosis not present

## 2019-02-28 DIAGNOSIS — M170 Bilateral primary osteoarthritis of knee: Secondary | ICD-10-CM | POA: Diagnosis not present

## 2019-02-28 DIAGNOSIS — M19012 Primary osteoarthritis, left shoulder: Secondary | ICD-10-CM | POA: Diagnosis not present

## 2019-03-05 DIAGNOSIS — E1140 Type 2 diabetes mellitus with diabetic neuropathy, unspecified: Secondary | ICD-10-CM | POA: Diagnosis not present

## 2019-03-05 DIAGNOSIS — I10 Essential (primary) hypertension: Secondary | ICD-10-CM | POA: Diagnosis not present

## 2019-03-05 DIAGNOSIS — M19012 Primary osteoarthritis, left shoulder: Secondary | ICD-10-CM | POA: Diagnosis not present

## 2019-03-05 DIAGNOSIS — M170 Bilateral primary osteoarthritis of knee: Secondary | ICD-10-CM | POA: Diagnosis not present

## 2019-03-05 DIAGNOSIS — M7500 Adhesive capsulitis of unspecified shoulder: Secondary | ICD-10-CM | POA: Diagnosis not present

## 2019-03-05 DIAGNOSIS — R270 Ataxia, unspecified: Secondary | ICD-10-CM | POA: Diagnosis not present

## 2019-03-07 DIAGNOSIS — I10 Essential (primary) hypertension: Secondary | ICD-10-CM | POA: Diagnosis not present

## 2019-03-07 DIAGNOSIS — M170 Bilateral primary osteoarthritis of knee: Secondary | ICD-10-CM | POA: Diagnosis not present

## 2019-03-07 DIAGNOSIS — M19012 Primary osteoarthritis, left shoulder: Secondary | ICD-10-CM | POA: Diagnosis not present

## 2019-03-07 DIAGNOSIS — M7500 Adhesive capsulitis of unspecified shoulder: Secondary | ICD-10-CM | POA: Diagnosis not present

## 2019-03-07 DIAGNOSIS — R270 Ataxia, unspecified: Secondary | ICD-10-CM | POA: Diagnosis not present

## 2019-03-07 DIAGNOSIS — E1140 Type 2 diabetes mellitus with diabetic neuropathy, unspecified: Secondary | ICD-10-CM | POA: Diagnosis not present

## 2019-03-12 DIAGNOSIS — R270 Ataxia, unspecified: Secondary | ICD-10-CM | POA: Diagnosis not present

## 2019-03-12 DIAGNOSIS — M19012 Primary osteoarthritis, left shoulder: Secondary | ICD-10-CM | POA: Diagnosis not present

## 2019-03-12 DIAGNOSIS — M170 Bilateral primary osteoarthritis of knee: Secondary | ICD-10-CM | POA: Diagnosis not present

## 2019-03-12 DIAGNOSIS — E1140 Type 2 diabetes mellitus with diabetic neuropathy, unspecified: Secondary | ICD-10-CM | POA: Diagnosis not present

## 2019-03-12 DIAGNOSIS — M7500 Adhesive capsulitis of unspecified shoulder: Secondary | ICD-10-CM | POA: Diagnosis not present

## 2019-03-12 DIAGNOSIS — I10 Essential (primary) hypertension: Secondary | ICD-10-CM | POA: Diagnosis not present

## 2019-03-14 DIAGNOSIS — M170 Bilateral primary osteoarthritis of knee: Secondary | ICD-10-CM | POA: Diagnosis not present

## 2019-03-14 DIAGNOSIS — I10 Essential (primary) hypertension: Secondary | ICD-10-CM | POA: Diagnosis not present

## 2019-03-14 DIAGNOSIS — E1140 Type 2 diabetes mellitus with diabetic neuropathy, unspecified: Secondary | ICD-10-CM | POA: Diagnosis not present

## 2019-03-14 DIAGNOSIS — M7500 Adhesive capsulitis of unspecified shoulder: Secondary | ICD-10-CM | POA: Diagnosis not present

## 2019-03-14 DIAGNOSIS — R270 Ataxia, unspecified: Secondary | ICD-10-CM | POA: Diagnosis not present

## 2019-03-14 DIAGNOSIS — M19012 Primary osteoarthritis, left shoulder: Secondary | ICD-10-CM | POA: Diagnosis not present

## 2019-03-17 DIAGNOSIS — R270 Ataxia, unspecified: Secondary | ICD-10-CM | POA: Diagnosis not present

## 2019-03-17 DIAGNOSIS — M19012 Primary osteoarthritis, left shoulder: Secondary | ICD-10-CM | POA: Diagnosis not present

## 2019-03-17 DIAGNOSIS — Z7982 Long term (current) use of aspirin: Secondary | ICD-10-CM | POA: Diagnosis not present

## 2019-03-17 DIAGNOSIS — Z9181 History of falling: Secondary | ICD-10-CM | POA: Diagnosis not present

## 2019-03-17 DIAGNOSIS — Z7984 Long term (current) use of oral hypoglycemic drugs: Secondary | ICD-10-CM | POA: Diagnosis not present

## 2019-03-17 DIAGNOSIS — Z951 Presence of aortocoronary bypass graft: Secondary | ICD-10-CM | POA: Diagnosis not present

## 2019-03-17 DIAGNOSIS — I10 Essential (primary) hypertension: Secondary | ICD-10-CM | POA: Diagnosis not present

## 2019-03-17 DIAGNOSIS — M7500 Adhesive capsulitis of unspecified shoulder: Secondary | ICD-10-CM | POA: Diagnosis not present

## 2019-03-17 DIAGNOSIS — E1140 Type 2 diabetes mellitus with diabetic neuropathy, unspecified: Secondary | ICD-10-CM | POA: Diagnosis not present

## 2019-03-17 DIAGNOSIS — M170 Bilateral primary osteoarthritis of knee: Secondary | ICD-10-CM | POA: Diagnosis not present

## 2019-03-19 DIAGNOSIS — M19012 Primary osteoarthritis, left shoulder: Secondary | ICD-10-CM | POA: Diagnosis not present

## 2019-03-19 DIAGNOSIS — R270 Ataxia, unspecified: Secondary | ICD-10-CM | POA: Diagnosis not present

## 2019-03-19 DIAGNOSIS — E1140 Type 2 diabetes mellitus with diabetic neuropathy, unspecified: Secondary | ICD-10-CM | POA: Diagnosis not present

## 2019-03-19 DIAGNOSIS — M170 Bilateral primary osteoarthritis of knee: Secondary | ICD-10-CM | POA: Diagnosis not present

## 2019-03-19 DIAGNOSIS — M7500 Adhesive capsulitis of unspecified shoulder: Secondary | ICD-10-CM | POA: Diagnosis not present

## 2019-03-19 DIAGNOSIS — I10 Essential (primary) hypertension: Secondary | ICD-10-CM | POA: Diagnosis not present

## 2019-03-22 DIAGNOSIS — R270 Ataxia, unspecified: Secondary | ICD-10-CM | POA: Diagnosis not present

## 2019-03-22 DIAGNOSIS — I10 Essential (primary) hypertension: Secondary | ICD-10-CM | POA: Diagnosis not present

## 2019-03-22 DIAGNOSIS — M170 Bilateral primary osteoarthritis of knee: Secondary | ICD-10-CM | POA: Diagnosis not present

## 2019-03-22 DIAGNOSIS — E1140 Type 2 diabetes mellitus with diabetic neuropathy, unspecified: Secondary | ICD-10-CM | POA: Diagnosis not present

## 2019-03-22 DIAGNOSIS — M7500 Adhesive capsulitis of unspecified shoulder: Secondary | ICD-10-CM | POA: Diagnosis not present

## 2019-03-22 DIAGNOSIS — M19012 Primary osteoarthritis, left shoulder: Secondary | ICD-10-CM | POA: Diagnosis not present

## 2019-03-26 ENCOUNTER — Other Ambulatory Visit: Payer: Self-pay | Admitting: Cardiology

## 2019-03-29 ENCOUNTER — Telehealth: Payer: Self-pay | Admitting: Cardiology

## 2019-03-29 NOTE — Telephone Encounter (Signed)
Patient's son is calling stating his father went to pick up a refill for his BP medication and the pharmacy would not give it because they said he had already picked it up back in June for 90 days.  They can not find it in the house.  They want to know what they can do.

## 2019-03-29 NOTE — Telephone Encounter (Signed)
Spoke with pt's son who report they went to the pharmacy to pick up pt amlodipine prescription and was informed pt had recently picked up a 90 day supply last month. Son report they can not find the medication and was inquiring as to what they should do. Nurse advised Son to contact pharmacy to see if they could possibly give them an out of pocket estimate that will last pt until new Rx can be billed to insurance. Son voiced understanding and will contact office if they are unable to get it.

## 2019-04-19 ENCOUNTER — Encounter: Payer: Self-pay | Admitting: *Deleted

## 2019-04-19 NOTE — Telephone Encounter (Signed)
Spoke with pt about changing 05-22-2019 appointment to virtual. Patient ask I call his son. Left message for george to call to discuss changing to virtual visit.

## 2019-04-27 NOTE — Telephone Encounter (Signed)
This encounter was created in error - please disregard.

## 2019-05-21 DIAGNOSIS — I4891 Unspecified atrial fibrillation: Secondary | ICD-10-CM | POA: Diagnosis not present

## 2019-05-21 DIAGNOSIS — M170 Bilateral primary osteoarthritis of knee: Secondary | ICD-10-CM | POA: Diagnosis not present

## 2019-05-21 DIAGNOSIS — I10 Essential (primary) hypertension: Secondary | ICD-10-CM | POA: Diagnosis not present

## 2019-05-21 DIAGNOSIS — R351 Nocturia: Secondary | ICD-10-CM | POA: Diagnosis not present

## 2019-05-21 DIAGNOSIS — Z951 Presence of aortocoronary bypass graft: Secondary | ICD-10-CM | POA: Diagnosis not present

## 2019-05-21 DIAGNOSIS — E1140 Type 2 diabetes mellitus with diabetic neuropathy, unspecified: Secondary | ICD-10-CM | POA: Diagnosis not present

## 2019-05-21 DIAGNOSIS — M7500 Adhesive capsulitis of unspecified shoulder: Secondary | ICD-10-CM | POA: Diagnosis not present

## 2019-05-21 NOTE — Progress Notes (Signed)
Virtual Visit via Video Note   This visit type was conducted due to national recommendations for restrictions regarding the COVID-19 Pandemic (e.g. social distancing) in an effort to limit this patient's exposure and mitigate transmission in our community.  Due to his co-morbid illnesses, this patient is at least at moderate risk for complications without adequate follow up.  This format is felt to be most appropriate for this patient at this time.  All issues noted in this document were discussed and addressed.  A limited physical exam was performed with this format.  Please refer to the patient's chart for his consent to telehealth for [facility].   Date:  05/22/2019   ID:  Franklin IslamJames Harshbarger, DOB [birth year], MRN [id number]  Patient Location:Home Provider Location: Home  PCP:  Kela MillinBarrino, Alethea Y, MD  Cardiologist:  Dr Jens Somrenshaw  Evaluation Performed:  Follow-Up Visit  Chief Complaint:  FU CAD  History of Present Illness:    FU coronary artery disease. Patient is status post coronary artery bypass graft at [facility] in 1993. [facility] in January of 2014 with chest pain. He ruled in for a non-ST elevation myocardial infarction. Cardiac catheterization in January of 2014 showed an occluded left main, LAD, circumflex. There was a 95% RCA. LIMA to the LAD was patent. Saphenous vein graft to theobtusemarginal had a 95% lesion. Saphenous vein graft to the diagonal was patent. Saphenous vein graft to the RCA had a 99% stenosis. Patient had PCI of the saphenous vein graft to the right coronary artery and saphenous vein graft to the obtuse marginal with drug-eluting stents. Echocardiogram in January 2014 showed an ejection fraction of 45-50%. The patient also had SVT during hospitalization. He was seen by electrophysiology and placed on amiodarone. Carotid Dopplers March 2017 showed moderate atherosclerosis but no hemodynamically significant internal carotid artery  stenosis bilaterally. Since last seen,patient denies dyspnea, chest pain, palpitations or syncope.  The patient does not have symptoms concerning for COVID-19 infection (fever, chills, cough, or new shortness of breath).    Past Medical History:  Diagnosis Date  . Bifascicular block   . Coronary artery disease    CABG 1993 at [facility]; NSTEMI 09/2012 s/p DES to SVG-OM1, DES to SVG-PDA   . DM (diabetes mellitus) (HCC)   . HTN (hypertension)   . SVT (supraventricular tachycardia) (HCC)    Past Surgical History:  Procedure Laterality Date  . CARDIAC SURGERY     1993 CABG  . LEFT HEART CATHETERIZATION WITH CORONARY/GRAFT ANGIOGRAM N/A 10/04/2012   Procedure: LEFT HEART CATHETERIZATION WITH Isabel CapriceORONARY/GRAFT ANGIOGRAM;  Surgeon: Tonny BollmanMichael Cooper, MD;  [facility];  Service: Cardiovascular;  Laterality: N/A;     Current Meds  Medication Sig  . amiodarone (PACERONE) 200 MG tablet TAKE ONE HALF TABLET BY MOUTH ONCE DAILY  . amLODipine (NORVASC) 5 MG tablet TAKE ONE TABLET BY MOUTH ONCE DAILY  . aspirin EC 81 MG EC tablet Take 1 tablet (81 mg total) by mouth daily.  Marland Kitchen. atorvastatin (LIPITOR) 10 MG tablet Take 10 mg by mouth at bedtime.  Marland Kitchen. doxazosin (CARDURA) 4 MG tablet Take 4 mg by mouth at bedtime.  . famotidine (PEPCID) 20 MG tablet Take 1 tablet by mouth twice daily  . levothyroxine (SYNTHROID) 25 MCG tablet Take 1 tablet (25 mcg total) by mouth daily before breakfast.  . metFORMIN (GLUCOPHAGE-XR) 500 MG 24 hr tablet Take 1 tablet by mouth daily.  . nitroGLYCERIN (NITROSTAT) 0.4 MG SL tablet Place  1 tablet (0.4 mg total) under the tongue every 5 (five) minutes x 3 doses as needed for chest pain.     Allergies:   Patient has no known allergies.   Social History   Tobacco Use  . Smoking status: Never Smoker  . Smokeless tobacco: Never Used  Substance Use Topics  . Alcohol use: No    Alcohol/week: 0.0 standard drinks  . Drug use: No     Family Hx: The patient's  family history includes CAD in his unknown relative.  ROS:   Please see the history of present illness.    No Fever, chills  or productive cough; does have knee arthralgias and problems with balance. All other systems reviewed and are negative.   Recent Labs: 10/19/2018: ALT 13; BUN 23; Creatinine, Ser 1.15; Hemoglobin 11.4; Platelets 165; Potassium 5.0; Sodium 138; TSH 22.170   Recent Lipid Panel Lab Results  Component Value Date/Time   CHOL 132 04/14/2018 08:44 AM   TRIG 68 04/14/2018 08:44 AM   HDL 66 04/14/2018 08:44 AM   CHOLHDL 2.0 04/14/2018 08:44 AM   CHOLHDL 2.2 02/14/2015 09:05 AM   LDLCALC 52 04/14/2018 08:44 AM    Wt Readings from Last 3 Encounters:  05/22/19 158 lb (71.7 kg)  10/19/18 159 lb 9.6 oz (72.4 kg)  04/14/18 162 lb (73.5 kg)     Objective:    Vital Signs:  BP (!) 128/46   Pulse 70   Ht 5\' 11"  (1.803 m)   Wt 158 lb (71.7 kg)   BMI 22.04 kg/m    VITAL SIGNS:  reviewed NAD Answers questions appropriately Normal affect Remainder of physical examination not performed (telehealth visit; coronavirus pandemic)  ASSESSMENT & PLAN:    1. Coronary artery disease-patient is status post coronary artery bypass graft.  He denies recurrent chest pain.  Continue medical therapy with aspirin and statin. 2. History of supraventricular tachycardia-patient has had no recurrent palpitations.  Continue amiodarone at present dose.  Plan to repeat TSH, liver functions. 3. Hypertension-blood pressure is controlled.  Continue present medical regimen. 4. Hyperlipidemia-continue statin. 5. Carotid artery disease-mild on most recent study.  We will continue with medical therapy.  COVID-19 Education: The importance of social distancing was discussed today.  Time:   Today, I have spent 17 minutes with the patient with telehealth technology discussing the above problems.     Medication Adjustments/Labs and Tests Ordered: Current medicines are reviewed at length with  the patient today.  Concerns regarding medicines are outlined above.   Tests Ordered: No orders of the defined types were placed in this encounter.   Medication Changes: No orders of the defined types were placed in this encounter.   Follow Up:  Virtual Visit or In Person in 6 month(s)  Signed, Kirk Ruths, MD  05/22/2019 7:56 AM    [place]

## 2019-05-22 ENCOUNTER — Telehealth (INDEPENDENT_AMBULATORY_CARE_PROVIDER_SITE_OTHER): Payer: Medicare Other | Admitting: Cardiology

## 2019-05-22 ENCOUNTER — Encounter: Payer: Self-pay | Admitting: Cardiology

## 2019-05-22 VITALS — BP 128/46 | HR 70 | Ht 71.0 in | Wt 158.0 lb

## 2019-05-22 DIAGNOSIS — I10 Essential (primary) hypertension: Secondary | ICD-10-CM

## 2019-05-22 DIAGNOSIS — I2510 Atherosclerotic heart disease of native coronary artery without angina pectoris: Secondary | ICD-10-CM

## 2019-05-22 DIAGNOSIS — E7800 Pure hypercholesterolemia, unspecified: Secondary | ICD-10-CM | POA: Diagnosis not present

## 2019-05-22 DIAGNOSIS — Z79899 Other long term (current) drug therapy: Secondary | ICD-10-CM

## 2019-05-22 NOTE — Patient Instructions (Signed)
Medication Instructions:  The current medical regimen is effective;  continue present plan and medications.  If you need a refill on your cardiac medications before your next appointment, please call your pharmacy.   Lab work: TSH, HEPATIC labs- draw at [facility]. Have them fax results to [place]- at [phone] If you have labs (blood work) drawn today and your tests are completely normal, you will receive your results only by: Marland Kitchen MyChart Message (if you have MyChart) OR . A paper copy in the mail If you have any lab test that is abnormal or we need to change your treatment, we will call you to review the results.  Follow-Up: At [facility], you and your health needs are our priority.  As part of our continuing mission to provide you with exceptional heart care, we have created designated Provider Care Teams.  These Care Teams include your primary Cardiologist (physician) and Advanced Practice Providers (APPs -  Physician Assistants and Nurse Practitioners) who all work together to provide you with the care you need, when you need it. You will need a follow up appointment in 6 months.  Please call our office 2 months in advance to schedule this appointment.  You may see Dr.Crenshaw or one of the following Advanced Practice Providers on your designated Care Team:   Kerin Ransom, PA-C Roby Lofts, [place] . Sande Rives, PA-C

## 2019-05-28 ENCOUNTER — Other Ambulatory Visit: Payer: Self-pay | Admitting: Cardiology

## 2019-06-14 IMAGING — DX DG CHEST 2V
2 series · 2 of 2 positions shown · non-contrast
Comparison: Radiographs June 16, 2017.

CLINICAL DATA: Coronary artery disease.

EXAM:
CHEST - 2 VIEW

[chest pa]
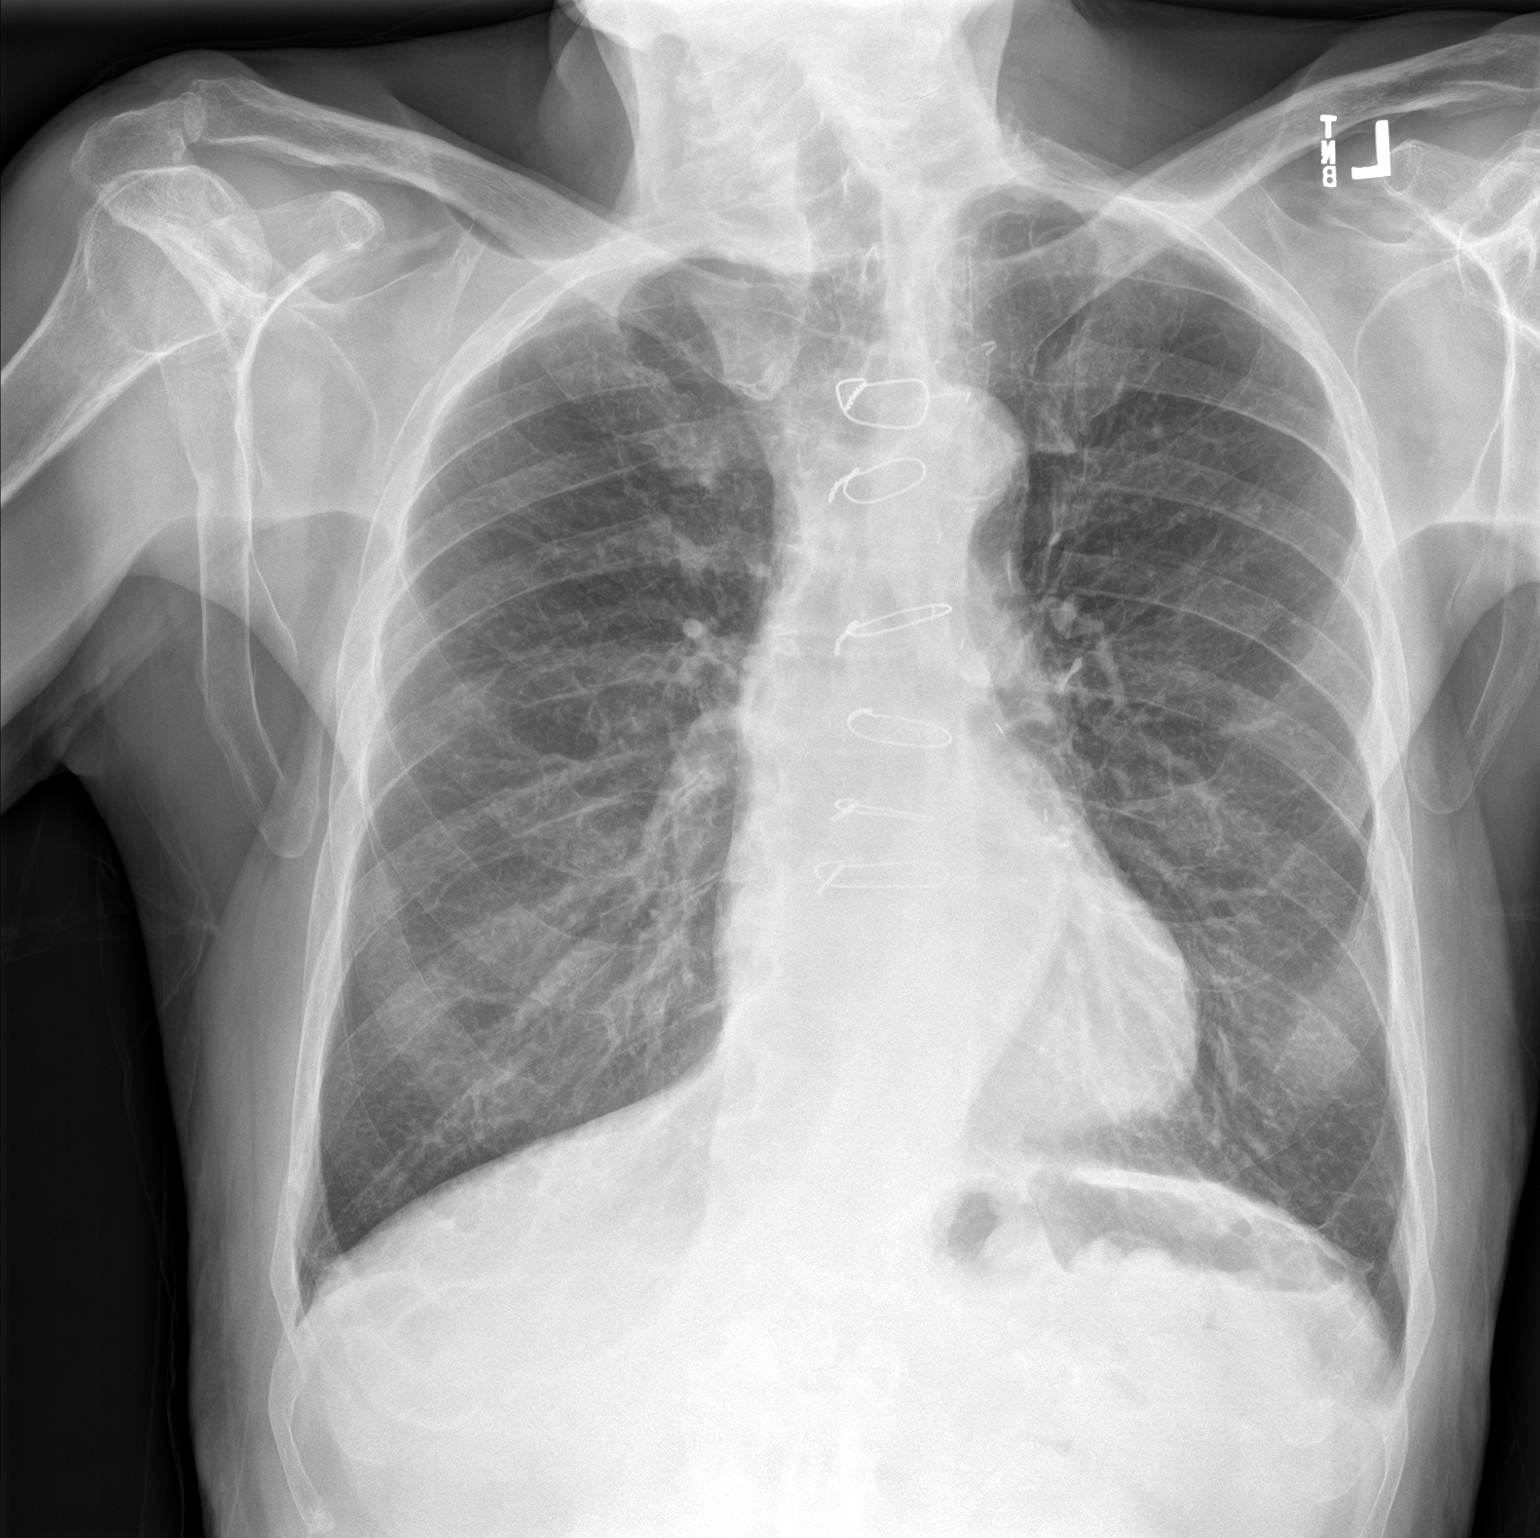

[chest lat]
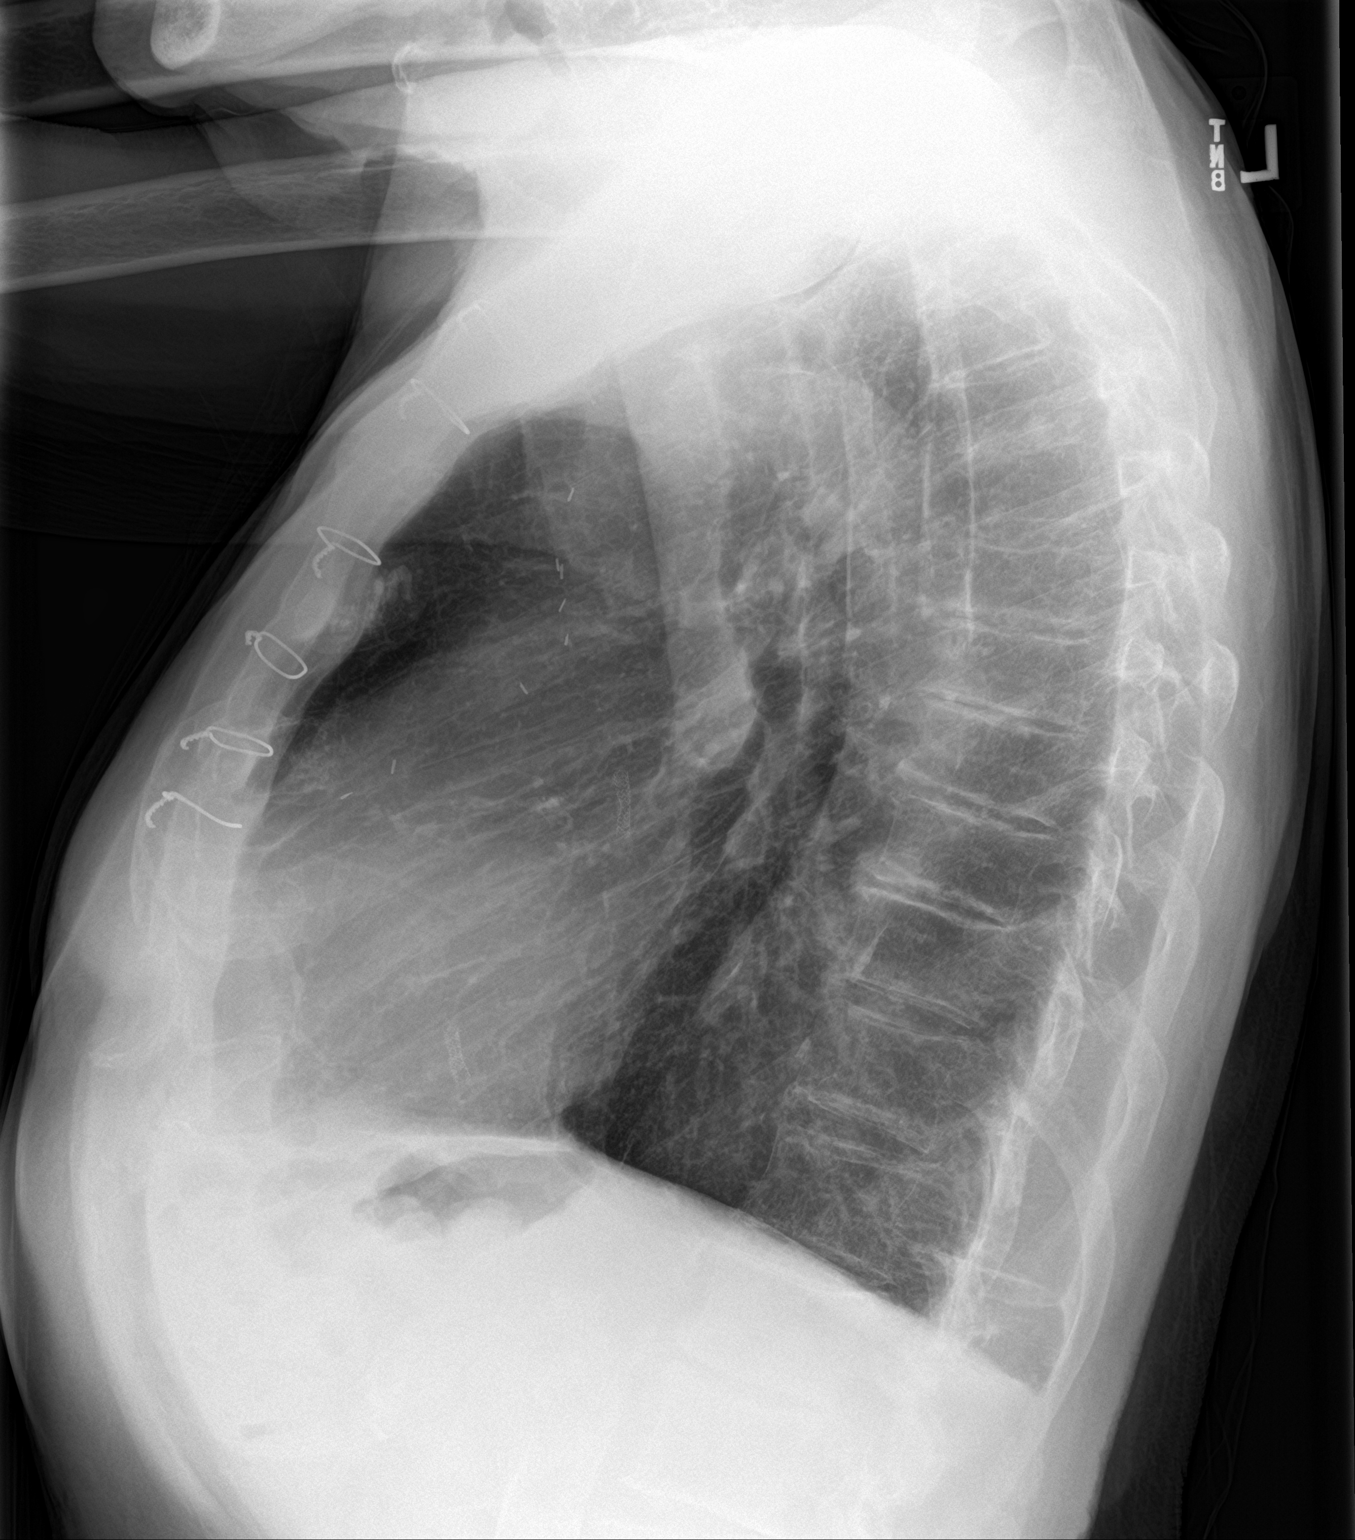

[2 of 2 positions shown; findings below may reference images not displayed]

FINDINGS: The heart size and mediastinal contours are within normal limits.
Both lungs are clear. Atherosclerosis of thoracic aorta is noted.
Sternotomy wires are noted. No pneumothorax or pleural effusion is
noted. The visualized skeletal structures are unremarkable.
IMPRESSION: No active cardiopulmonary disease.

Aortic Atherosclerosis ([id number]).

## 2019-06-17 DIAGNOSIS — Z23 Encounter for immunization: Secondary | ICD-10-CM | POA: Diagnosis not present

## 2019-07-11 DIAGNOSIS — H40023 Open angle with borderline findings, high risk, bilateral: Secondary | ICD-10-CM | POA: Diagnosis not present

## 2019-07-31 DIAGNOSIS — E119 Type 2 diabetes mellitus without complications: Secondary | ICD-10-CM | POA: Diagnosis not present

## 2019-07-31 DIAGNOSIS — Z961 Presence of intraocular lens: Secondary | ICD-10-CM | POA: Diagnosis not present

## 2019-07-31 DIAGNOSIS — H26491 Other secondary cataract, right eye: Secondary | ICD-10-CM | POA: Diagnosis not present

## 2019-10-01 DIAGNOSIS — Z23 Encounter for immunization: Secondary | ICD-10-CM | POA: Diagnosis not present

## 2019-10-23 DIAGNOSIS — Z23 Encounter for immunization: Secondary | ICD-10-CM | POA: Diagnosis not present

## 2019-12-14 DIAGNOSIS — H6123 Impacted cerumen, bilateral: Secondary | ICD-10-CM | POA: Diagnosis not present

## 2019-12-16 NOTE — Progress Notes (Signed)
Cardiology Clinic Note   Patient Name: Franklin Gonzalez Date of Encounter: 12/17/2019  Primary Care Provider:  Selinda Flavin, MD Primary Cardiologist:  Olga Millers, MD  Patient Profile    Franklin Gonzalez 86 year old male presents today for a follow-up evaluation of his coronary artery disease, SVT, hypertension, anemia, and hyperlipidemia.  Past Medical History    Past Medical History:  Diagnosis Date  . Bifascicular block   . Coronary artery disease    CABG 1993 at [facility]; NSTEMI 09/2012 s/p DES to SVG-OM1, DES to SVG-PDA   . DM (diabetes mellitus) (HCC)   . HTN (hypertension)   . SVT (supraventricular tachycardia) (HCC)    Past Surgical History:  Procedure Laterality Date  . CARDIAC SURGERY     1993 CABG  . LEFT HEART CATHETERIZATION WITH CORONARY/GRAFT ANGIOGRAM N/A 10/04/2012   Procedure: LEFT HEART CATHETERIZATION WITH Isabel Caprice;  Surgeon: Tonny Bollman, MD;  Location: [facility] CATH LAB;  Service: Cardiovascular;  Laterality: N/A;    Allergies  No Known Allergies  History of Present Illness    Franklin Gonzalez has a past medical history of coronary artery disease.  He underwent CABG at [facility] in 1993.  He was admitted to [facility] 1/14 with chest pain.  He was ruled out for a non-STEMI.  Cardiac catheterization 1/14 showed an occluded left main, LAD, and circumflex.  There was a 95% RCA.  LIMA to LAD was patent, SVG to obtuse marginal had 95% lesion.  SVG to diagonal was patent.  S VG to RCA was 99% stenosed.  He had a PCI of the SVG to the RCA and SVG to obtuse marginal with DES.  Echocardiogram 1/14 showed an ejection fraction of 45-50%.  Patient had  SVT during hospitalization.  He was seen by EP and placed on amiodarone.  Carotid Dopplers 3/17 showed moderate atherosclerosis but no hemodynamically significant ICA stenosis bilaterally.  He was last seen by Dr. Jens Som on 05/22/2019.  During that time he denied dyspnea, chest discomfort, palpitations  or syncope.  He presents the clinic today, with his son, for follow-up evaluation and states he is starting to slow down some due to his bilateral knee pain.  Son wanted to know if surgery would be an option.  We talked about the reduced risk with knee injections.  He will follow up with orthopedics.  His son is also concerned about his increasing unsteadiness.  He has had no recent falls.  Son is planning on installing a ring type camera on the back of his house.  He has been using his stationary bike for exercise.  He also states he has been eating a low-sodium diet.  I will have him draw a lipid panel at his PCP, increase his physical activity as tolerated, give salty 6 sheet, and have him follow-up with Dr. Jens Som in 6 months.  Today he denies chest pain, shortness of breath, lower extremity edema, fatigue, palpitations, melena, hematuria, hemoptysis, diaphoresis, weakness, presyncope, syncope, orthopnea, and PND.   Home Medications    Prior to Admission medications   Medication Sig Start Date End Date Taking? Authorizing Provider  amiodarone (PACERONE) 200 MG tablet Take 1/2 (one-half) tablet by mouth once daily 05/28/19   Lewayne Bunting, MD  amLODipine (NORVASC) 5 MG tablet TAKE ONE TABLET BY MOUTH ONCE DAILY 06/27/17   Lewayne Bunting, MD  aspirin EC 81 MG EC tablet Take 1 tablet (81 mg total) by mouth daily. 10/07/12   Edmisten, Herby Abraham, PA-C  atorvastatin (LIPITOR) 10 MG tablet Take 10 mg by mouth at bedtime.    [provider]  doxazosin (CARDURA) 4 MG tablet Take 4 mg by mouth at bedtime.    [provider]  famotidine (PEPCID) 20 MG tablet Take 1 tablet by mouth twice daily 03/26/19   Lelon Perla, MD  levothyroxine (SYNTHROID) 25 MCG tablet Take 1 tablet (25 mcg total) by mouth daily before breakfast. 10/20/18   Lelon Perla, MD  metFORMIN (GLUCOPHAGE-XR) 500 MG 24 hr tablet Take 1 tablet by mouth daily. 09/21/13   [provider]  nitroGLYCERIN  (NITROSTAT) 0.4 MG SL tablet Place 1 tablet (0.4 mg total) under the tongue every 5 (five) minutes x 3 doses as needed for chest pain. 10/07/12   Edmisten, Azzie Roup, PA-C    Family History    Family History  Problem Relation Age of Onset  . CAD Unknown        multiple relatives including father and son   He indicated that his mother is deceased. He indicated that his father is deceased. He indicated that his maternal grandmother is deceased. He indicated that his maternal grandfather is deceased. He indicated that his paternal grandmother is deceased. He indicated that his paternal grandfather is deceased. He indicated that the status of his unknown relative is unknown.  Social History    Social History   Socioeconomic History  . Marital status: Married    Spouse name: Not on file  . Number of children: Not on file  . Years of education: Not on file  . Highest education level: Not on file  Occupational History  . Not on file  Tobacco Use  . Smoking status: Never Smoker  . Smokeless tobacco: Never Used  Substance and Sexual Activity  . Alcohol use: No    Alcohol/week: 0.0 standard drinks  . Drug use: No  . Sexual activity: Not on file  Other Topics Concern  . Not on file  Social History Narrative  . Not on file   Social Determinants of Health   Financial Resource Strain:   . Difficulty of Paying Living Expenses:   Food Insecurity:   . Worried About Charity fundraiser in the Last Year:   . Arboriculturist in the Last Year:   Transportation Needs:   . Film/video editor (Medical):   Marland Kitchen Lack of Transportation (Non-Medical):   Physical Activity:   . Days of Exercise per Week:   . Minutes of Exercise per Session:   Stress:   . Feeling of Stress :   Social Connections:   . Frequency of Communication with Friends and Family:   . Frequency of Social Gatherings with Friends and Family:   . Attends Religious Services:   . Active Member of Clubs or Organizations:   .  Attends Archivist Meetings:   Marland Kitchen Marital Status:   Intimate Partner Violence:   . Fear of Current or Ex-Partner:   . Emotionally Abused:   Marland Kitchen Physically Abused:   . Sexually Abused:      Review of Systems    General:  No chills, fever, night sweats or weight changes.  Cardiovascular:  No chest pain, dyspnea on exertion, edema, orthopnea, palpitations, paroxysmal nocturnal dyspnea. Dermatological: No rash, lesions/masses Respiratory: No cough, dyspnea Urologic: No hematuria, dysuria Abdominal:   No nausea, vomiting, diarrhea, bright red blood per rectum, melena, or hematemesis Neurologic:  No visual changes, wkns, changes in mental status. All other  systems reviewed and are otherwise negative except as noted above.  Physical Exam    VS:  BP (!) 154/82   Pulse 86   Ht 5\' 11"  (1.803 m)   Wt 160 lb 3.2 oz (72.7 kg)   SpO2 99%   BMI 22.34 kg/m  , BMI Body mass index is 22.34 kg/m. GEN: Well nourished, well developed, in no acute distress. HEENT: normal. Neck: Supple, no JVD, carotid bruits, or masses. Cardiac: RRR, no murmurs, rubs, or gallops. No clubbing, cyanosis, edema.  Radials/DP/PT 2+ and equal bilaterally.  Respiratory:  Respirations regular and unlabored, clear to auscultation bilaterally. GI: Soft, nontender, nondistended, BS + x 4. MS: no deformity or atrophy. Skin: warm and dry, no rash. Neuro:  Strength and sensation are intact. Psych: Normal affect.  Accessory Clinical Findings    ECG personally reviewed by me today-none today.   EKG 04/14/2018 Normal sinus rhythm with sinus arrhythmia RBBB left anterior fascicular block 67 bpm  Echocardiogram 10/03/2012 Study Conclusions   Left ventricle: Poor endocardial definition Inferior wall  appears hypokinetic The cavity size was mildly dilated. Wall  thickness was normal. Systolic function was mildly reduced.  The estimated ejection fraction was in the range of 45% to  50%.       Transthoracic  echocardiography. M-mode,  complete 2D, spectral Doppler, and color Doppler. Height:  Height: 180.3cm. Height: 71in. Weight: Weight: 76.8kg.  Weight: 169lb. Body mass index: BMI: 23.6kg/m^2. Body  surface area:  BSA: 1.61m^2. Blood pressure:   129/82.  Patient status: Inpatient. Location: ICU/CCU   Assessment & Plan   1.  Coronary artery disease-no chest pain today.  Underwent cardiac catheterization 1/14 and received PCI with DES x2 to SVG-RCA and SVG to OM.  Echocardiogram showed 45-50% EF. Continue aspirin Continue nitroglycerin 0.4 mg sublingual as needed Continue atorvastatin 10 mg daily Continue amlodipine 5 mg daily Heart healthy low-sodium diet Increase physical activity as tolerated  History of SVT-no recent episodes of palpitations or chest discomfort. Continue amiodarone 100 mg daily  Hypertension-BP today 154/82.  Well-controlled at home systolics in the 130s. Continue amlodipine 5 mg daily Continue doxazosin 4 mg tablet daily Heart healthy low-sodium diet-salty 6 given Increase physical activity as tolerated Blood pressure log-take blood pressure one time per week and bring to next appointment.  Hyperlipidemia-LDL 52 04/14/2018 Continue Lipitor 10 mg daily Heart healthy low-sodium high-fiber diet Increase physical activity as tolerated Order lipid panel-order if PCP  Disposition: Follow-up with Dr. 06/14/2018 in 6 months.  Jens Som. Tou Hayner NP-C       [facility] [address] Office [phone] Fax [phone]

## 2019-12-17 ENCOUNTER — Other Ambulatory Visit: Payer: Self-pay

## 2019-12-17 ENCOUNTER — Encounter: Payer: Self-pay | Admitting: General Practice

## 2019-12-17 ENCOUNTER — Ambulatory Visit (INDEPENDENT_AMBULATORY_CARE_PROVIDER_SITE_OTHER): Payer: Medicare Other | Admitting: General Practice

## 2019-12-17 VITALS — BP 154/82 | HR 86 | Ht 71.0 in | Wt 160.2 lb

## 2019-12-17 DIAGNOSIS — I2510 Atherosclerotic heart disease of native coronary artery without angina pectoris: Secondary | ICD-10-CM

## 2019-12-17 DIAGNOSIS — I10 Essential (primary) hypertension: Secondary | ICD-10-CM

## 2019-12-17 DIAGNOSIS — E7800 Pure hypercholesterolemia, unspecified: Secondary | ICD-10-CM | POA: Diagnosis not present

## 2019-12-17 DIAGNOSIS — I471 Supraventricular tachycardia: Secondary | ICD-10-CM

## 2019-12-17 NOTE — Patient Instructions (Signed)
Medication Instructions:  The current medical regimen is effective;  continue present plan and medications as directed. Please refer to the Current Medication list given to you today. *If you need a refill on your cardiac medications before your next appointment, please call your pharmacy*  Lab Work: PLEASE HAVE LIPID/CHOLESTEROL PANEL DONE AT PCP If you have labs (blood work) drawn today and your tests are completely normal, you will receive your results only by:  MyChart Message (if you have MyChart) OR A paper copy in the mail.  If you have any lab test that is abnormal or we need to change your treatment, we will call you to review the results.  Special Instructions PLEASE INCREASE PHYSICAL ACTIVITY AS TOLERATED-15 MIN DAILY  TAKE AND LOG YOU BLOOD PRESSURE WEEKLY  PLEASE READ AND FOLLOW SALTY 6-ATTACHED  Follow-Up: Your next appointment:  6 month(s) Please call our office 2 months in advance to schedule this appointment In Person with You may see Olga Millers, MD or one of the following Advanced Practice Providers on your designated Care Team:  Edd Fabian, FNP Corine Shelter, PA-C  Marjie Skiff, [place]  At [facility], you and your health needs are our priority.  As part of our continuing mission to provide you with exceptional heart care, we have created designated Provider Care Teams.  These Care Teams include your primary Cardiologist (physician) and Advanced Practice Providers (APPs -  Physician Assistants and Nurse Practitioners) who all work together to provide you with the care you need, when you need it.  We recommend signing up for the patient portal called "MyChart".  Sign up information is provided on this After Visit Summary.  MyChart is used to connect with patients for Virtual Visits (Telemedicine).  Patients are able to view lab/test results, encounter notes, upcoming appointments, etc.  Non-urgent messages can be sent to your provider as well.   To learn more about  what you can do with MyChart, go to [email or web address].

## 2019-12-21 DIAGNOSIS — M1711 Unilateral primary osteoarthritis, right knee: Secondary | ICD-10-CM | POA: Diagnosis not present

## 2019-12-21 DIAGNOSIS — Z6823 Body mass index (BMI) 23.0-23.9, adult: Secondary | ICD-10-CM | POA: Diagnosis not present

## 2019-12-21 DIAGNOSIS — I10 Essential (primary) hypertension: Secondary | ICD-10-CM | POA: Diagnosis not present

## 2019-12-21 DIAGNOSIS — M1712 Unilateral primary osteoarthritis, left knee: Secondary | ICD-10-CM | POA: Diagnosis not present

## 2019-12-21 DIAGNOSIS — Z13220 Encounter for screening for lipoid disorders: Secondary | ICD-10-CM | POA: Diagnosis not present

## 2020-01-01 ENCOUNTER — Encounter: Payer: Self-pay | Admitting: *Deleted

## 2020-04-16 DIAGNOSIS — Z6823 Body mass index (BMI) 23.0-23.9, adult: Secondary | ICD-10-CM | POA: Diagnosis not present

## 2020-04-16 DIAGNOSIS — Z951 Presence of aortocoronary bypass graft: Secondary | ICD-10-CM | POA: Diagnosis not present

## 2020-04-16 DIAGNOSIS — E782 Mixed hyperlipidemia: Secondary | ICD-10-CM | POA: Diagnosis not present

## 2020-04-16 DIAGNOSIS — M1711 Unilateral primary osteoarthritis, right knee: Secondary | ICD-10-CM | POA: Diagnosis not present

## 2020-04-16 DIAGNOSIS — M1712 Unilateral primary osteoarthritis, left knee: Secondary | ICD-10-CM | POA: Diagnosis not present

## 2020-04-16 DIAGNOSIS — I10 Essential (primary) hypertension: Secondary | ICD-10-CM | POA: Diagnosis not present

## 2020-06-05 ENCOUNTER — Other Ambulatory Visit: Payer: Self-pay | Admitting: Cardiology

## 2020-06-13 DIAGNOSIS — I2510 Atherosclerotic heart disease of native coronary artery without angina pectoris: Secondary | ICD-10-CM | POA: Diagnosis not present

## 2020-06-13 DIAGNOSIS — S92314A Nondisplaced fracture of first metatarsal bone, right foot, initial encounter for closed fracture: Secondary | ICD-10-CM | POA: Diagnosis not present

## 2020-06-13 DIAGNOSIS — W19XXXA Unspecified fall, initial encounter: Secondary | ICD-10-CM | POA: Diagnosis not present

## 2020-06-13 DIAGNOSIS — S92901A Unspecified fracture of right foot, initial encounter for closed fracture: Secondary | ICD-10-CM | POA: Diagnosis not present

## 2020-06-13 DIAGNOSIS — N183 Chronic kidney disease, stage 3 unspecified: Secondary | ICD-10-CM | POA: Diagnosis not present

## 2020-06-13 DIAGNOSIS — E1122 Type 2 diabetes mellitus with diabetic chronic kidney disease: Secondary | ICD-10-CM | POA: Diagnosis not present

## 2020-06-13 DIAGNOSIS — I129 Hypertensive chronic kidney disease with stage 1 through stage 4 chronic kidney disease, or unspecified chronic kidney disease: Secondary | ICD-10-CM | POA: Diagnosis not present

## 2020-06-13 DIAGNOSIS — M19071 Primary osteoarthritis, right ankle and foot: Secondary | ICD-10-CM | POA: Diagnosis not present

## 2020-06-17 DIAGNOSIS — R350 Frequency of micturition: Secondary | ICD-10-CM | POA: Diagnosis not present

## 2020-06-17 DIAGNOSIS — Z20828 Contact with and (suspected) exposure to other viral communicable diseases: Secondary | ICD-10-CM | POA: Diagnosis not present

## 2020-06-18 DIAGNOSIS — R350 Frequency of micturition: Secondary | ICD-10-CM | POA: Diagnosis not present

## 2020-06-19 DIAGNOSIS — K529 Noninfective gastroenteritis and colitis, unspecified: Secondary | ICD-10-CM | POA: Diagnosis not present

## 2020-06-19 DIAGNOSIS — I4891 Unspecified atrial fibrillation: Secondary | ICD-10-CM | POA: Diagnosis not present

## 2020-06-19 DIAGNOSIS — R0902 Hypoxemia: Secondary | ICD-10-CM | POA: Diagnosis not present

## 2020-06-19 DIAGNOSIS — Z20828 Contact with and (suspected) exposure to other viral communicable diseases: Secondary | ICD-10-CM | POA: Diagnosis not present

## 2020-06-19 DIAGNOSIS — S92909A Unspecified fracture of unspecified foot, initial encounter for closed fracture: Secondary | ICD-10-CM | POA: Diagnosis not present

## 2020-06-24 NOTE — Progress Notes (Deleted)
HPI: FU coronary artery disease. Patient is status post coronary artery bypass graft at [facility] in 1993. He was admitted to [facility] in January of 2014 with chest pain. He ruled in for a non-ST elevation myocardial infarction. Cardiac catheterization in January of 2014 showed an occluded left main, LAD, circumflex. There was a 95% RCA. LIMA to the LAD was patent. Saphenous vein graft to theobtusemarginal had a 95% lesion. Saphenous vein graft to the diagonal was patent. Saphenous vein graft to the RCA had a 99% stenosis. Patient had PCI of the saphenous vein graft to the right coronary artery and saphenous vein graft to the obtuse marginal with drug-eluting stents. Echocardiogram in January 2014 showed an ejection fraction of 45-50%. The patient also had SVT during hospitalization. He was seen by electrophysiology and placed on amiodarone. Carotid Dopplers March 2017 showed moderate atherosclerosis but no hemodynamically significant internal carotid artery stenosis bilaterally. Since last seen,  Current Outpatient Medications  Medication Sig Dispense Refill  . amiodarone (PACERONE) 200 MG tablet Take 1/2 (one-half) tablet by mouth once daily 45 tablet 6  . amLODipine (NORVASC) 5 MG tablet TAKE ONE TABLET BY MOUTH ONCE DAILY 90 tablet 3  . aspirin EC 81 MG EC tablet Take 1 tablet (81 mg total) by mouth daily.    Marland Kitchen atorvastatin (LIPITOR) 10 MG tablet Take 10 mg by mouth at bedtime.    Marland Kitchen doxazosin (CARDURA) 4 MG tablet Take 4 mg by mouth at bedtime.    Marland Kitchen levothyroxine (SYNTHROID) 25 MCG tablet Take 1 tablet (25 mcg total) by mouth daily before breakfast. 90 tablet 3  . metFORMIN (GLUCOPHAGE-XR) 500 MG 24 hr tablet Take 1 tablet by mouth daily.    . nitroGLYCERIN (NITROSTAT) 0.4 MG SL tablet Place 1 tablet (0.4 mg total) under the tongue every 5 (five) minutes x 3 doses as needed for chest pain. 60 tablet 3   No current facility-administered medications for this visit.      Past Medical History:  Diagnosis Date  . Bifascicular block   . Coronary artery disease    CABG 1993 at [facility]; NSTEMI 09/2012 s/p DES to SVG-OM1, DES to SVG-PDA   . DM (diabetes mellitus) (HCC)   . HTN (hypertension)   . SVT (supraventricular tachycardia) (HCC)     Past Surgical History:  Procedure Laterality Date  . CARDIAC SURGERY     1993 CABG  . LEFT HEART CATHETERIZATION WITH CORONARY/GRAFT ANGIOGRAM N/A 10/04/2012   Procedure: LEFT HEART CATHETERIZATION WITH Isabel Caprice;  Surgeon: Tonny Bollman, MD;  Location: [facility] CATH LAB;  Service: Cardiovascular;  Laterality: N/A;    Social History   Socioeconomic History  . Marital status: Married    Spouse name: Not on file  . Number of children: Not on file  . Years of education: Not on file  . Highest education level: Not on file  Occupational History  . Not on file  Tobacco Use  . Smoking status: Never Smoker  . Smokeless tobacco: Never Used  Substance and Sexual Activity  . Alcohol use: No    Alcohol/week: 0.0 standard drinks  . Drug use: No  . Sexual activity: Not on file  Other Topics Concern  . Not on file  Social History Narrative  . Not on file   Social Determinants of Health   Financial Resource Strain:   . Difficulty of Paying Living Expenses: Not on file  Food Insecurity:   . Worried About Radiation protection practitioner  of Food in the Last Year: Not on file  . Ran Out of Food in the Last Year: Not on file  Transportation Needs:   . Lack of Transportation (Medical): Not on file  . Lack of Transportation (Non-Medical): Not on file  Physical Activity:   . Days of Exercise per Week: Not on file  . Minutes of Exercise per Session: Not on file  Stress:   . Feeling of Stress : Not on file  Social Connections:   . Frequency of Communication with Friends and Family: Not on file  . Frequency of Social Gatherings with Friends and Family: Not on file  . Attends Religious Services: Not on file  . Active  Member of Clubs or Organizations: Not on file  . Attends Banker Meetings: Not on file  . Marital Status: Not on file  Intimate Partner Violence:   . Fear of Current or Ex-Partner: Not on file  . Emotionally Abused: Not on file  . Physically Abused: Not on file  . Sexually Abused: Not on file    Family History  Problem Relation Age of Onset  . CAD Unknown        multiple relatives including father and son    ROS: no fevers or chills, productive cough, hemoptysis, dysphasia, odynophagia, melena, hematochezia, dysuria, hematuria, rash, seizure activity, orthopnea, PND, pedal edema, claudication. Remaining systems are negative.  Physical Exam: Well-developed well-nourished in no acute distress.  Skin is warm and dry.  HEENT is normal.  Neck is supple.  Chest is clear to auscultation with normal expansion.  Cardiovascular exam is regular rate and rhythm.  Abdominal exam nontender or distended. No masses palpated. Extremities show no edema. neuro grossly intact  ECG- personally reviewed  A/P  1 coronary artery disease status post coronary artery bypass graft-patient continues to deny chest pain.  Plan to continue medical therapy with aspirin and statin.  2 supraventricular tachycardia-no recurrent palpitations by report.  Continue amiodarone.  Check TSH, liver functions and chest x-ray.  3 hypertension-blood pressure controlled today.  Continue present medications.  4 hyperlipidemia-continue statin.  Check lipids.  5 carotid artery disease-mild on most recent Dopplers.  Olga Millers, MD

## 2020-06-26 ENCOUNTER — Telehealth: Payer: Self-pay | Admitting: Cardiology

## 2020-06-26 ENCOUNTER — Other Ambulatory Visit: Payer: Self-pay

## 2020-06-26 ENCOUNTER — Emergency Department (HOSPITAL_COMMUNITY): Payer: Medicare Other

## 2020-06-26 ENCOUNTER — Inpatient Hospital Stay (HOSPITAL_COMMUNITY)
Admission: EM | Admit: 2020-06-26 | Discharge: 2020-07-02 | DRG: 291 | Disposition: A | Discharge status: Home Health | Payer: Medicare Other | Attending: Internal Medicine | Admitting: Internal Medicine

## 2020-06-26 ENCOUNTER — Encounter (HOSPITAL_COMMUNITY): Payer: Self-pay | Admitting: *Deleted

## 2020-06-26 DIAGNOSIS — R9431 Abnormal electrocardiogram [ECG] [EKG]: Secondary | ICD-10-CM | POA: Diagnosis not present

## 2020-06-26 DIAGNOSIS — E1122 Type 2 diabetes mellitus with diabetic chronic kidney disease: Secondary | ICD-10-CM | POA: Diagnosis present

## 2020-06-26 DIAGNOSIS — Z79890 Hormone replacement therapy (postmenopausal): Secondary | ICD-10-CM

## 2020-06-26 DIAGNOSIS — Z66 Do not resuscitate: Secondary | ICD-10-CM | POA: Diagnosis present

## 2020-06-26 DIAGNOSIS — I252 Old myocardial infarction: Secondary | ICD-10-CM

## 2020-06-26 DIAGNOSIS — J90 Pleural effusion, not elsewhere classified: Secondary | ICD-10-CM | POA: Diagnosis not present

## 2020-06-26 DIAGNOSIS — Z8249 Family history of ischemic heart disease and other diseases of the circulatory system: Secondary | ICD-10-CM

## 2020-06-26 DIAGNOSIS — I453 Trifascicular block: Secondary | ICD-10-CM | POA: Diagnosis present

## 2020-06-26 DIAGNOSIS — I509 Heart failure, unspecified: Secondary | ICD-10-CM

## 2020-06-26 DIAGNOSIS — I5043 Acute on chronic combined systolic (congestive) and diastolic (congestive) heart failure: Secondary | ICD-10-CM | POA: Diagnosis present

## 2020-06-26 DIAGNOSIS — Z20822 Contact with and (suspected) exposure to covid-19: Secondary | ICD-10-CM | POA: Diagnosis present

## 2020-06-26 DIAGNOSIS — I10 Essential (primary) hypertension: Secondary | ICD-10-CM | POA: Diagnosis present

## 2020-06-26 DIAGNOSIS — E785 Hyperlipidemia, unspecified: Secondary | ICD-10-CM | POA: Diagnosis present

## 2020-06-26 DIAGNOSIS — I2510 Atherosclerotic heart disease of native coronary artery without angina pectoris: Secondary | ICD-10-CM | POA: Diagnosis not present

## 2020-06-26 DIAGNOSIS — N390 Urinary tract infection, site not specified: Secondary | ICD-10-CM | POA: Diagnosis present

## 2020-06-26 DIAGNOSIS — I255 Ischemic cardiomyopathy: Secondary | ICD-10-CM | POA: Diagnosis present

## 2020-06-26 DIAGNOSIS — Z9181 History of falling: Secondary | ICD-10-CM | POA: Diagnosis not present

## 2020-06-26 DIAGNOSIS — J9601 Acute respiratory failure with hypoxia: Secondary | ICD-10-CM

## 2020-06-26 DIAGNOSIS — I440 Atrioventricular block, first degree: Secondary | ICD-10-CM | POA: Diagnosis present

## 2020-06-26 DIAGNOSIS — Z23 Encounter for immunization: Secondary | ICD-10-CM

## 2020-06-26 DIAGNOSIS — N183 Chronic kidney disease, stage 3 unspecified: Secondary | ICD-10-CM

## 2020-06-26 DIAGNOSIS — Z955 Presence of coronary angioplasty implant and graft: Secondary | ICD-10-CM

## 2020-06-26 DIAGNOSIS — I110 Hypertensive heart disease with heart failure: Secondary | ICD-10-CM | POA: Diagnosis not present

## 2020-06-26 DIAGNOSIS — I130 Hypertensive heart and chronic kidney disease with heart failure and stage 1 through stage 4 chronic kidney disease, or unspecified chronic kidney disease: Principal | ICD-10-CM | POA: Diagnosis present

## 2020-06-26 DIAGNOSIS — E871 Hypo-osmolality and hyponatremia: Secondary | ICD-10-CM | POA: Diagnosis not present

## 2020-06-26 DIAGNOSIS — Z20828 Contact with and (suspected) exposure to other viral communicable diseases: Secondary | ICD-10-CM | POA: Diagnosis not present

## 2020-06-26 DIAGNOSIS — I248 Other forms of acute ischemic heart disease: Secondary | ICD-10-CM | POA: Diagnosis present

## 2020-06-26 DIAGNOSIS — I471 Supraventricular tachycardia, unspecified: Secondary | ICD-10-CM | POA: Diagnosis present

## 2020-06-26 DIAGNOSIS — Z951 Presence of aortocoronary bypass graft: Secondary | ICD-10-CM | POA: Diagnosis not present

## 2020-06-26 DIAGNOSIS — E1165 Type 2 diabetes mellitus with hyperglycemia: Secondary | ICD-10-CM | POA: Diagnosis present

## 2020-06-26 DIAGNOSIS — I5041 Acute combined systolic (congestive) and diastolic (congestive) heart failure: Secondary | ICD-10-CM | POA: Diagnosis not present

## 2020-06-26 DIAGNOSIS — R059 Cough, unspecified: Secondary | ICD-10-CM | POA: Diagnosis not present

## 2020-06-26 DIAGNOSIS — N1832 Chronic kidney disease, stage 3b: Secondary | ICD-10-CM | POA: Diagnosis not present

## 2020-06-26 DIAGNOSIS — I358 Other nonrheumatic aortic valve disorders: Secondary | ICD-10-CM | POA: Diagnosis present

## 2020-06-26 DIAGNOSIS — N179 Acute kidney failure, unspecified: Secondary | ICD-10-CM | POA: Diagnosis present

## 2020-06-26 DIAGNOSIS — N1831 Chronic kidney disease, stage 3a: Secondary | ICD-10-CM | POA: Diagnosis not present

## 2020-06-26 DIAGNOSIS — I5023 Acute on chronic systolic (congestive) heart failure: Secondary | ICD-10-CM

## 2020-06-26 DIAGNOSIS — Z79899 Other long term (current) drug therapy: Secondary | ICD-10-CM

## 2020-06-26 DIAGNOSIS — I5021 Acute systolic (congestive) heart failure: Secondary | ICD-10-CM | POA: Diagnosis not present

## 2020-06-26 DIAGNOSIS — E876 Hypokalemia: Secondary | ICD-10-CM | POA: Diagnosis present

## 2020-06-26 DIAGNOSIS — I2583 Coronary atherosclerosis due to lipid rich plaque: Secondary | ICD-10-CM | POA: Diagnosis not present

## 2020-06-26 DIAGNOSIS — I340 Nonrheumatic mitral (valve) insufficiency: Secondary | ICD-10-CM | POA: Diagnosis not present

## 2020-06-26 DIAGNOSIS — R0600 Dyspnea, unspecified: Secondary | ICD-10-CM | POA: Diagnosis not present

## 2020-06-26 DIAGNOSIS — J811 Chronic pulmonary edema: Secondary | ICD-10-CM | POA: Diagnosis not present

## 2020-06-26 DIAGNOSIS — R531 Weakness: Secondary | ICD-10-CM | POA: Diagnosis not present

## 2020-06-26 DIAGNOSIS — R7989 Other specified abnormal findings of blood chemistry: Secondary | ICD-10-CM | POA: Diagnosis present

## 2020-06-26 DIAGNOSIS — Z7982 Long term (current) use of aspirin: Secondary | ICD-10-CM

## 2020-06-26 DIAGNOSIS — R778 Other specified abnormalities of plasma proteins: Secondary | ICD-10-CM | POA: Diagnosis not present

## 2020-06-26 DIAGNOSIS — Z7984 Long term (current) use of oral hypoglycemic drugs: Secondary | ICD-10-CM

## 2020-06-26 LAB — COMPREHENSIVE METABOLIC PANEL
ALT: 44 U/L (ref 0–44)
AST: 40 U/L (ref 15–41)
Albumin: 3.7 g/dL (ref 3.5–5.0)
Alkaline Phosphatase: 90 U/L (ref 38–126)
Anion gap: 11 (ref 5–15)
BUN: 53 mg/dL — ABNORMAL HIGH (ref 8–23)
CO2: 21 mmol/L — ABNORMAL LOW (ref 22–32)
Calcium: 8.9 mg/dL (ref 8.9–10.3)
Chloride: 99 mmol/L (ref 98–111)
Creatinine, Ser: 1.56 mg/dL — ABNORMAL HIGH (ref 0.61–1.24)
GFR, Estimated: 40 mL/min — ABNORMAL LOW (ref >60)
Glucose, Bld: 226 mg/dL — ABNORMAL HIGH (ref 70–99)
Potassium: 5.2 mmol/L — ABNORMAL HIGH (ref 3.5–5.1)
Sodium: 131 mmol/L — ABNORMAL LOW (ref 135–145)
Total Bilirubin: 0.7 mg/dL (ref 0.3–1.2)
Total Protein: 6.7 g/dL (ref 6.5–8.1)

## 2020-06-26 LAB — CBC WITH DIFFERENTIAL/PLATELET
Abs Immature Granulocytes: 0.11 10*3/uL — ABNORMAL HIGH (ref 0.00–0.07)
Basophils Absolute: 0 10*3/uL (ref 0.0–0.1)
Basophils Relative: 0 %
Eosinophils Absolute: 0 10*3/uL (ref 0.0–0.5)
Eosinophils Relative: 0 %
HCT: 35.2 % — ABNORMAL LOW (ref 39.0–52.0)
Hemoglobin: 11.3 g/dL — ABNORMAL LOW (ref 13.0–17.0)
Immature Granulocytes: 1 %
Lymphocytes Relative: 10 %
Lymphs Abs: 0.9 10*3/uL (ref 0.7–4.0)
MCH: 30.3 pg (ref 26.0–34.0)
MCHC: 32.1 g/dL (ref 30.0–36.0)
MCV: 94.4 fL (ref 80.0–100.0)
Monocytes Absolute: 0.6 10*3/uL (ref 0.1–1.0)
Monocytes Relative: 7 %
Neutro Abs: 7.1 10*3/uL (ref 1.7–7.7)
Neutrophils Relative %: 82 %
Platelets: 285 10*3/uL (ref 150–400)
RBC: 3.73 MIL/uL — ABNORMAL LOW (ref 4.22–5.81)
RDW: 13.6 % (ref 11.5–15.5)
WBC: 8.8 10*3/uL (ref 4.0–10.5)
nRBC: 0 % (ref 0.0–0.2)

## 2020-06-26 LAB — MAGNESIUM: Magnesium: 3 mg/dL — ABNORMAL HIGH (ref 1.7–2.4)

## 2020-06-26 LAB — BRAIN NATRIURETIC PEPTIDE: B Natriuretic Peptide: 3447 pg/mL — ABNORMAL HIGH (ref 0.0–100.0)

## 2020-06-26 LAB — RESPIRATORY PANEL BY RT PCR (FLU A&B, COVID)
Influenza A by PCR: NEGATIVE
Influenza B by PCR: NEGATIVE
SARS Coronavirus 2 by RT PCR: NEGATIVE

## 2020-06-26 LAB — TROPONIN I (HIGH SENSITIVITY)
Troponin I (High Sensitivity): 648 ng/L (ref <18)
Troponin I (High Sensitivity): 571 ng/L (ref <18)

## 2020-06-26 MED ORDER — SODIUM CHLORIDE 0.9 % IV SOLN
1.0000 g | INTRAVENOUS | Status: DC
  Administered 2020-06-27: 1 g via INTRAVENOUS
  Filled 2020-06-26: qty 10

## 2020-06-26 MED ORDER — SODIUM CHLORIDE 0.9 % IV SOLN
100.0000 mg | Freq: Two times a day (BID) | INTRAVENOUS | Status: DC
  Administered 2020-06-27: 100 mg via INTRAVENOUS
  Filled 2020-06-26 (×9): qty 100

## 2020-06-26 MED ORDER — GUAIFENESIN-DM 100-10 MG/5ML PO SYRP
10.0000 mL | ORAL_SOLUTION | Freq: Three times a day (TID) | ORAL | Status: AC
  Administered 2020-06-27 – 2020-06-28 (×5): 10 mL via ORAL
  Filled 2020-06-26 (×5): qty 10

## 2020-06-26 MED ORDER — FUROSEMIDE 10 MG/ML IJ SOLN
40.0000 mg | Freq: Once | INTRAMUSCULAR | Status: AC
  Administered 2020-06-26: 40 mg via INTRAVENOUS
  Filled 2020-06-26: qty 4

## 2020-06-26 MED ORDER — INFLUENZA VAC A&B SA ADJ QUAD 0.5 ML IM PRSY
0.5000 mL | PREFILLED_SYRINGE | INTRAMUSCULAR | Status: AC
  Administered 2020-06-27: 0.5 mL via INTRAMUSCULAR
  Filled 2020-06-26: qty 0.5

## 2020-06-26 NOTE — ED Provider Notes (Signed)
[facility] EMERGENCY DEPARTMENT Provider Note   CSN: [id number] Arrival date & time: 06/26/20  1624     History Chief Complaint  Patient presents with  . Cough    Franklin Gonzalez is a 86 y.o. male.  The history is provided by the patient and a relative. No language interpreter was used.  Cough Cough characteristics:  Productive Sputum characteristics:  Nondescript Severity:  Mild Onset quality:  Gradual Timing:  Constant Chronicity:  New Relieved by:  Nothing Worsened by:  Nothing Ineffective treatments:  None tried Associated symptoms: no chest pain   [facility] and sent here for possible ekg changes.  Pt had a negative covid in the office.  Pt's son reports they told him pt's chest xray looked normal.      Past Medical History:  Diagnosis Date  . Bifascicular block   . Coronary artery disease    CABG 1993 at [facility]; NSTEMI 09/2012 s/p DES to SVG-OM1, DES to SVG-PDA   . DM (diabetes mellitus) (HCC)   . HTN (hypertension)   . SVT (supraventricular tachycardia) [facility])     Patient Active Problem List   Diagnosis Date Noted  . Cerebrovascular disease 04/10/2013  . CAD (coronary artery disease) 10/27/2012  . Bruit 10/27/2012  . Hyperlipidemia 10/27/2012  . Fever 10/07/2012  . Anemia, normocytic normochromic 10/07/2012  . SVT (supraventricular tachycardia) (HCC) 10/03/2012  . Fasting hyperglycemia   . HTN (hypertension)   . Acute myocardial infarction, subendocardial infarction, initial episode of care [facility]) 10/02/2012    Past Surgical History:  Procedure Laterality Date  . CARDIAC SURGERY     1993 CABG  . LEFT HEART CATHETERIZATION WITH CORONARY/GRAFT ANGIOGRAM N/A 10/04/2012   Procedure: LEFT HEART CATHETERIZATION WITH Isabel Caprice;  Surgeon: Tonny Bollman, MD;  Location: [facility] CATH LAB;  Service: Cardiovascular;  Laterality: N/A;       Family History  Problem Relation Age of Onset  . CAD Other        multiple  relatives including father and son    Social History   Tobacco Use  . Smoking status: Never Smoker  . Smokeless tobacco: Never Used  Substance Use Topics  . Alcohol use: No    Alcohol/week: 0.0 standard drinks  . Drug use: No    Home Medications Prior to Admission medications   Medication Sig Start Date End Date Taking? Authorizing Provider  amiodarone (PACERONE) 200 MG tablet Take 1/2 (one-half) tablet by mouth once daily 06/05/20   Lewayne Bunting, MD  amLODipine (NORVASC) 5 MG tablet TAKE ONE TABLET BY MOUTH ONCE DAILY 06/27/17   Lewayne Bunting, MD  aspirin EC 81 MG EC tablet Take 1 tablet (81 mg total) by mouth daily. 10/07/12   Edmisten, Brooke O, PA-C  atorvastatin (LIPITOR) 10 MG tablet Take 10 mg by mouth at bedtime.    [provider]  doxazosin (CARDURA) 4 MG tablet Take 4 mg by mouth at bedtime.    [provider]  levothyroxine (SYNTHROID) 25 MCG tablet Take 1 tablet (25 mcg total) by mouth daily before breakfast. 10/20/18   Lewayne Bunting, MD  metFORMIN (GLUCOPHAGE-XR) 500 MG 24 hr tablet Take 1 tablet by mouth daily. 09/21/13   [provider]  nitroGLYCERIN (NITROSTAT) 0.4 MG SL tablet Place 1 tablet (0.4 mg total) under the tongue every 5 (five) minutes x 3 doses as needed for chest pain. 10/07/12   Edmisten, Herby Abraham, PA-C    Allergies  Patient has no known allergies.  Review of Systems   Review of Systems  Respiratory: Positive for cough.   Cardiovascular: Negative for chest pain.  All other systems reviewed and are negative.   Physical Exam Updated Vital Signs BP 130/87   Pulse 78   Temp 98.2 F (36.8 C) (Oral)   Resp (!) 23   Ht 5\' 11"  (1.803 m)   Wt 70.3 kg   SpO2 90%   BMI 21.62 kg/m   Physical Exam Vitals and nursing note reviewed.  Constitutional:      Appearance: He is well-developed.  HENT:     Head: Normocephalic and atraumatic.  Eyes:     Conjunctiva/sclera: Conjunctivae normal.  Cardiovascular:      Rate and Rhythm: Normal rate and regular rhythm.     Heart sounds: No murmur heard.   Pulmonary:     Effort: Pulmonary effort is normal. No respiratory distress.     Breath sounds: Normal breath sounds.  Abdominal:     Palpations: Abdomen is soft.     Tenderness: There is no abdominal tenderness.  Musculoskeletal:        General: Swelling present.     Cervical back: Neck supple.  Skin:    General: Skin is warm and dry.  Neurological:     General: No focal deficit present.     Mental Status: He is alert.  Psychiatric:        Mood and Affect: Mood normal.     ED Results / Procedures / Treatments   Labs (all labs ordered are listed, but only abnormal results are displayed) Labs Reviewed  CBC WITH DIFFERENTIAL/PLATELET - Abnormal; Notable for the following components:      Result Value   RBC 3.73 (*)    Hemoglobin 11.3 (*)    HCT 35.2 (*)    Abs Immature Granulocytes 0.11 (*)    All other components within normal limits  COMPREHENSIVE METABOLIC PANEL  TROPONIN I (HIGH SENSITIVITY)    EKG None  Radiology No results found.  Procedures Procedures (including critical care time)  Medications Ordered in ED Medications - No data to display  ED Course  I have reviewed the triage vital signs and the nursing notes.  Pertinent labs & imaging results that were available during my care of the patient were reviewed by me and considered in my medical decision making (see chart for details).    MDM Rules/Calculators/A&P                          MDM:  Pt's care turned over to [facility] Final Clinical Impression(s) / ED Diagnoses Final diagnoses:  None    Rx / DC Orders ED Discharge Orders    None       [facility] 06/26/20 06/28/20, MD 06/27/20 [phone]

## 2020-06-26 NOTE — ED Triage Notes (Signed)
Pt with productive cough (white/clear in color), not eating, generalized weakness, sore throat earlier but denies at present, +N/D.   Pt recently seen here for a fall and dx wit hfx foot of right foot.

## 2020-06-26 NOTE — ED Notes (Signed)
Date and time results received: 06/26/20 1952  Test: troponin Critical Value: 648  Name of Provider Notified: Estell Harpin, MD  Orders Received? Or Actions Taken?: acknowledged

## 2020-06-26 NOTE — ED Provider Notes (Signed)
  Patient signed out to me by Franklin Masker, PA-C  pending completion of work-up.  Patient is a 86 year old male here with generalized weakness and mild shortness of breath.  Seen at PCPs office earlier today and advised to come to the ER for possible EKG changes.  He has some bilateral lower extremity edema.  History of coronary artery disease hypertension and SVT.  Work-up this evening concerning for acute heart failure.  His BNP is greater than 3000 and checks x-ray shows small pleural effusions bilaterally with left greater than right.  No leukocytosis or fever.  Chemistries show mild hyperkalemia, BUN 53 creatinine 1.56.  These appear similar to baseline.  Initial troponin elevated at 648.  Patient is not having chest pain at this time. EKG unchanged.   Elevated troponin felt to be related to fluid overload.  IV Lasix ordered.  O2 sat at 91% on room air.  He is Covid negative and fully vaccinated.  Consulted cardiologist, Dr. Daphine Deutscher and discussed findings.  She agrees with care plan and felt reasonable for pt to stay here and see cards in am.    On recheck, pt resting comfortably, denies chest pain.  Had 350 cc urine output  2200  Consulted hospitalist, Dr. Mariea Clonts and discussed findings, she agrees to admit     DG Chest Port 1 View  Result Date: 06/26/2020 CLINICAL DATA:  Cough EXAM: PORTABLE CHEST 1 VIEW COMPARISON:  06/26/2020, 10/26/2018 FINDINGS: Post sternotomy changes. Small left greater than right pleural effusion with dense consolidation left lung base. Enlarged cardiomediastinal silhouette with mild diffuse interstitial opacity suspicious for pulmonary edema. Aortic atherosclerosis. No pneumothorax. IMPRESSION: Small left greater than right pleural effusions with dense consolidation at the left lung base, atelectasis versus pneumonia. Cardiomegaly with mild diffuse interstitial opacity suspect for mild pulmonary edema. Electronically Signed   By: Jasmine Pang M.D.   On: 06/26/2020 18:03    Labs Reviewed  CBC WITH DIFFERENTIAL/PLATELET - Abnormal; Notable for the following components:      Result Value   RBC 3.73 (*)    Hemoglobin 11.3 (*)    HCT 35.2 (*)    Abs Immature Granulocytes 0.11 (*)    All other components within normal limits  COMPREHENSIVE METABOLIC PANEL - Abnormal; Notable for the following components:   Sodium 131 (*)    Potassium 5.2 (*)    CO2 21 (*)    Glucose, Bld 226 (*)    BUN 53 (*)    Creatinine, Ser 1.56 (*)    GFR, Estimated 40 (*)    All other components within normal limits  BRAIN NATRIURETIC PEPTIDE - Abnormal; Notable for the following components:   B Natriuretic Peptide 3,447.0 (*)    All other components within normal limits  TROPONIN I (HIGH SENSITIVITY) - Abnormal; Notable for the following components:   Troponin I (High Sensitivity) 648 (*)    All other components within normal limits  RESPIRATORY PANEL BY RT PCR (FLU A&B, COVID)  TROPONIN I (HIGH SENSITIVITY)      Pauline Aus, PA-C 06/26/20 2216    Bethann Berkshire, MD 06/27/20 [phone]

## 2020-06-26 NOTE — Telephone Encounter (Signed)
[name] is calling in to make Dr. Jens Som aware that he is taking Franklin Gonzalez, the patient, to [facility] due to an EKG that was showing signs of a potential heart attack. He did not request a call back, but just wanted to make Dr. Jens Som aware.

## 2020-06-26 NOTE — H&P (Addendum)
History and Physical    Franklin Gonzalez YIF:[id number] DOB: 12/26/1921 DOA: 06/26/2020  PCP: Selinda Flavin, MD   Patient coming from: Home  I have personally briefly reviewed patient's old medical records in [facility] Link  Chief Complaint: Weakness  HPI: Franklin Gonzalez is a 86 y.o. male with medical history significant for CABG, SVT, hypertension, diabetes mellitus. Patient presented to the ED with complaints of weakness, cough, difficulty breathing.  Patient son is at bedside and assist with the history.  Difficulty breathing started about a week ago, also with cough productive of clear sputum.  No fever no chills.  Patient has had poor appetite, and is weak. Patient's son also reports over the past 2 weeks, patient has had progressive swelling of his bilateral lower extremities, such that he is unable to wear his footwear.  He also has swelling involving his pelvis and abdomen such that he is not even able to wear his pants.   About a week ago, patient had diarrhea, nausea, weakness, was treated with Keflex for a bladder infection by his primary care provider.  A few weeks ago, patient fell and was diagnosed with a fracture involving his right foot,  He has been wearing a brace.  Patient lives alone, prior to fall patient was quite functional, now ambulating with a walker.  ED Course: Tachypneic to 29, blood pressure systolic 1 4-1 40s, O2 sats evaluation down to 88% on room air Troponin 648 >> 571.  BNP elevated 3447.  Sodium 131.  Creatinine 1.56.  Chest x-ray shows small left greater than right pleural effusion with dense consolidation of the left lung base-atelectasis versus pneumonia, mild diffuse interstitial opacity-pulmonary edema suspected. IV Lasix 40 mg x 1.  EDP talked to cardiologist on-call Dr. Daphine Deutscher, agreed with diuresis, patient can be admitted here at [facility]. Hospitalist admit for acute CHF.  Review of Systems: As per HPI all other systems reviewed and  negative.  Past Medical History:  Diagnosis Date  . Bifascicular block   . Coronary artery disease    CABG 1993 at [facility]; NSTEMI 09/2012 s/p DES to SVG-OM1, DES to SVG-PDA   . DM (diabetes mellitus) (HCC)   . HTN (hypertension)   . SVT (supraventricular tachycardia) (HCC)     Past Surgical History:  Procedure Laterality Date  . CARDIAC SURGERY     1993 CABG  . LEFT HEART CATHETERIZATION WITH CORONARY/GRAFT ANGIOGRAM N/A 10/04/2012   Procedure: LEFT HEART CATHETERIZATION WITH Isabel Caprice;  Surgeon: Tonny Bollman, MD;  Location: [facility] CATH LAB;  Service: Cardiovascular;  Laterality: N/A;     reports that he has never smoked. He has never used smokeless tobacco. He reports that he does not drink alcohol and does not use drugs.  No Known Allergies  Family History  Problem Relation Age of Onset  . CAD Other        multiple relatives including father and son    Prior to Admission medications   Medication Sig Start Date End Date Taking? Authorizing Provider  amiodarone (PACERONE) 200 MG tablet Take 1/2 (one-half) tablet by mouth once daily 06/05/20  Yes Crenshaw, Madolyn Frieze, MD  amLODipine (NORVASC) 5 MG tablet TAKE ONE TABLET BY MOUTH ONCE DAILY 06/27/17  Yes Lewayne Bunting, MD  aspirin EC 81 MG EC tablet Take 1 tablet (81 mg total) by mouth daily. 10/07/12  Yes Edmisten, Brooke O, PA-C  atorvastatin (LIPITOR) 10 MG tablet Take 10 mg by mouth at bedtime.   Yes [provider]  cephALEXin (KEFLEX) 500 MG capsule Take 500 mg by mouth 3 (three) times daily. 06/17/20  Yes [provider]  doxazosin (CARDURA) 4 MG tablet Take 4 mg by mouth at bedtime.   Yes [provider]  levothyroxine (SYNTHROID) 25 MCG tablet Take 1 tablet (25 mcg total) by mouth daily before breakfast. 10/20/18  Yes Crenshaw, Madolyn Frieze, MD  metFORMIN (GLUCOPHAGE-XR) 500 MG 24 hr tablet Take 1 tablet by mouth daily. 09/21/13  Yes [provider]  nitroGLYCERIN  (NITROSTAT) 0.4 MG SL tablet Place 1 tablet (0.4 mg total) under the tongue every 5 (five) minutes x 3 doses as needed for chest pain. 10/07/12  Yes Edmisten, Herby Abraham, PA-C  ondansetron (ZOFRAN) 4 MG tablet Take by mouth. 06/17/20  Yes [provider]    Physical Exam: Vitals:   06/26/20 2000 06/26/20 2030 06/26/20 2100 06/26/20 2130  BP: 136/89 (!) 141/89 137/90 138/89  Pulse: 83 83 82 85  Resp: (!) 30 (!) 26 (!) 28 (!) 22  Temp:      TempSrc:      SpO2: 92% 91% 91% 90%  Weight:      Height:        Constitutional: NAD, calm, comfortable Vitals:   06/26/20 2000 06/26/20 2030 06/26/20 2100 06/26/20 2130  BP: 136/89 (!) 141/89 137/90 138/89  Pulse: 83 83 82 85  Resp: (!) 30 (!) 26 (!) 28 (!) 22  Temp:      TempSrc:      SpO2: 92% 91% 91% 90%  Weight:      Height:       Eyes: PERRL, lids and conjunctivae normal ENMT: Mucous membranes are moist.   Neck: normal, supple, no masses, no thyromegaly Respiratory: clear to auscultation bilaterally, no wheezing, Normal respiratory effort. No accessory muscle use.  Cardiovascular: Regular rate and rhythm, no murmurs / rubs / gallops.  2+ pitting extremity edema to knees bilaterally.  Extremity edema. 2+ pedal pulses.  Abdomen: no tenderness, no masses palpated. No hepatosplenomegaly. Bowel sounds positive.  Musculoskeletal: no clubbing / cyanosis. No joint deformity upper and lower extremities. Good ROM, no contractures. Normal muscle tone.  Skin: no rashes, lesions, ulcers. No induration Neurologic: No apparent cranial abnormality, spontaneous movement of extremities. Psychiatric: Normal judgment and insight. Alert and oriented x 3. Normal mood.   Labs on Admission: I have personally reviewed following labs and imaging studies  CBC: Recent Labs  Lab 06/26/20 1739  WBC 8.8  NEUTROABS 7.1  HGB 11.3*  HCT 35.2*  MCV 94.4  PLT 285   Basic Metabolic Panel: Recent Labs  Lab 06/26/20 1739  NA 131*  K 5.2*  CL 99  CO2  21*  GLUCOSE 226*  BUN 53*  CREATININE 1.56*  CALCIUM 8.9   GFR: Estimated Creatinine Clearance: 27.5 mL/min (A) (by C-G formula based on SCr of 1.56 mg/dL (H)). Liver Function Tests: Recent Labs  Lab 06/26/20 1739  AST 40  ALT 44  ALKPHOS 90  BILITOT 0.7  PROT 6.7  ALBUMIN 3.7    Radiological Exams on Admission: DG Chest Port 1 View  Result Date: 06/26/2020 CLINICAL DATA:  Cough EXAM: PORTABLE CHEST 1 VIEW COMPARISON:  06/26/2020, 10/26/2018 FINDINGS: Post sternotomy changes. Small left greater than right pleural effusion with dense consolidation left lung base. Enlarged cardiomediastinal silhouette with mild diffuse interstitial opacity suspicious for pulmonary edema. Aortic atherosclerosis. No pneumothorax. IMPRESSION: Small left greater than right pleural effusions with dense consolidation at the left lung base, atelectasis  versus pneumonia. Cardiomegaly with mild diffuse interstitial opacity suspect for mild pulmonary edema. Electronically Signed   By: Jasmine Pang M.D.   On: 06/26/2020 18:03    EKG: Independently reviewed.  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  QTc prolonged 552.  QRS prolonged 153.  Assessment/Plan Principal Problem:   Acute CHF (congestive heart failure) (HCC) Active Problems:   SVT (supraventricular tachycardia) (HCC)   HTN (hypertension)   CAD (coronary artery disease)   Acute CHF- 2+ bilateral lower extremity swelling, abdominal/pelvic bloating, BNP elevated at 3447, no prior to compare, portable chest x-ray shows mild pulmonary edema, small left greater than right pleural effusion, dense consolidation left base-atelectasis versus pneumonia.  Covid test negative.  Question concomitant pneumonia.  He is afebrile without leukocytosis. -IV Lasix 40 twice daily -Strict input output, daily weight -Daily BMP -Obtain procalcitonin,   Acute respiratory failure-O2 sats down to 88% on room air, likely due to decompensated CHF.  Chest x-ray suggesting  atelectasis versus possible pneumonia.  Afebrile without leukocytosis. -Obtain procalcitonin - Diuresis -Will start antibiotics with ceftriaxone and doxycycline -Supplemental oxygen -Mucolytic's  Hypervolemic hyponatremia sodium 131. -IV Lasix  Elevated troponin with history of CABG  1993, subsequent PCI 2014-now downtrending 648 > 571.  EKG without changes.  No chest pain.  Likely demand ischemia.  Follows with Dr. Jens Som. EDP talked to cardiologist on-call Dr. Daphine Deutscher, recommended admission here, diuresis. -Resume aspirin, statins.  Diabetes mellitus-random glucose 226. -Hold home Metformin - SSI- S  Hypertension-stable. -Resume Norvasc, doxazosin.  SVT-  -Resume amiodarone  CKD 3-creatinine 1.5, baseline over the past 2 years 1.1-1.3.  Mild elevation likely cardiorenal. -IV Lasix  Recent fracture right foot-wearing a brace, has been evaluated by Ortho. - PT evaluation  Prolonged QTC-552.  Potassium 5.2. -Check magnesium -Patient on amiodarone  DVT prophylaxis: Lovenox Code Status: DNR confirmed with patient and patient's son at bedside Family Communication: Patient son Franklin Gonzalez present at bedside is healthcare power of attorney, Disposition Plan:  ~ 2 days Consults called: None Admission status: Inpatient, telemetry. I certify that at the point of admission it is my clinical judgment that the patient will require inpatient hospital care spanning beyond 2 midnights from the point of admission due to high intensity of service, high risk for further deterioration and high frequency of surveillance required. The following factors support the patient status of inpatient: Volume overload requiring diuresis.   Onnie Boer MD [facility]  06/26/2020, 10:07 PM

## 2020-06-26 NOTE — ED Notes (Signed)
Date and time results received: 06/26/20 2106  Test: Troponin Critical Value: 571  Name of Provider Notified: Trisha Mangle, PA  Orders Received? Or Actions Taken?: acknowledged

## 2020-06-27 ENCOUNTER — Inpatient Hospital Stay (HOSPITAL_COMMUNITY): Payer: Medicare Other

## 2020-06-27 DIAGNOSIS — I471 Supraventricular tachycardia: Secondary | ICD-10-CM

## 2020-06-27 DIAGNOSIS — R778 Other specified abnormalities of plasma proteins: Secondary | ICD-10-CM

## 2020-06-27 DIAGNOSIS — I5023 Acute on chronic systolic (congestive) heart failure: Secondary | ICD-10-CM

## 2020-06-27 DIAGNOSIS — R9431 Abnormal electrocardiogram [ECG] [EKG]: Secondary | ICD-10-CM | POA: Diagnosis not present

## 2020-06-27 DIAGNOSIS — I5021 Acute systolic (congestive) heart failure: Secondary | ICD-10-CM

## 2020-06-27 DIAGNOSIS — I509 Heart failure, unspecified: Secondary | ICD-10-CM

## 2020-06-27 DIAGNOSIS — I340 Nonrheumatic mitral (valve) insufficiency: Secondary | ICD-10-CM

## 2020-06-27 DIAGNOSIS — N1832 Chronic kidney disease, stage 3b: Secondary | ICD-10-CM

## 2020-06-27 DIAGNOSIS — N183 Chronic kidney disease, stage 3 unspecified: Secondary | ICD-10-CM

## 2020-06-27 DIAGNOSIS — J9601 Acute respiratory failure with hypoxia: Secondary | ICD-10-CM

## 2020-06-27 DIAGNOSIS — E871 Hypo-osmolality and hyponatremia: Secondary | ICD-10-CM

## 2020-06-27 LAB — BASIC METABOLIC PANEL
Anion gap: 9 (ref 5–15)
BUN: 53 mg/dL — ABNORMAL HIGH (ref 8–23)
CO2: 22 mmol/L (ref 22–32)
Calcium: 8.2 mg/dL — ABNORMAL LOW (ref 8.9–10.3)
Chloride: 102 mmol/L (ref 98–111)
Creatinine, Ser: 1.43 mg/dL — ABNORMAL HIGH (ref 0.61–1.24)
GFR, Estimated: 45 mL/min — ABNORMAL LOW (ref >60)
Glucose, Bld: 223 mg/dL — ABNORMAL HIGH (ref 70–99)
Potassium: 4.7 mmol/L (ref 3.5–5.1)
Sodium: 133 mmol/L — ABNORMAL LOW (ref 135–145)

## 2020-06-27 LAB — URINALYSIS, COMPLETE (UACMP) WITH MICROSCOPIC
Bacteria, UA: NONE SEEN
Bilirubin Urine: NEGATIVE
Glucose, UA: NEGATIVE mg/dL
Hgb urine dipstick: NEGATIVE
Ketones, ur: NEGATIVE mg/dL
Leukocytes,Ua: NEGATIVE
Nitrite: NEGATIVE
Protein, ur: NEGATIVE mg/dL
RBC / HPF: NONE SEEN RBC/hpf (ref 0–5)
Specific Gravity, Urine: 1.02 (ref 1.005–1.030)
WBC, UA: NONE SEEN WBC/hpf (ref 0–5)
pH: 5 (ref 5.0–8.0)

## 2020-06-27 LAB — GLUCOSE, CAPILLARY
Glucose-Capillary: 199 mg/dL — ABNORMAL HIGH (ref 70–99)
Glucose-Capillary: 192 mg/dL — ABNORMAL HIGH (ref 70–99)
Glucose-Capillary: 212 mg/dL — ABNORMAL HIGH (ref 70–99)
Glucose-Capillary: 137 mg/dL — ABNORMAL HIGH (ref 70–99)
Glucose-Capillary: 168 mg/dL — ABNORMAL HIGH (ref 70–99)

## 2020-06-27 LAB — ECHOCARDIOGRAM COMPLETE
AR max vel: 2.64 cm2
AV Area VTI: 2 cm2
AV Area mean vel: 2.13 cm2
AV Mean grad: 2.3 mmHg
AV Peak grad: 3.9 mmHg
Ao pk vel: 0.98 m/s
Area-P 1/2: 3.97 cm2
Height: 71 in
S' Lateral: 5.25 cm
Weight: 2737.23 oz

## 2020-06-27 LAB — PROCALCITONIN: Procalcitonin: <0.1 ng/mL

## 2020-06-27 MED ORDER — ENOXAPARIN SODIUM 30 MG/0.3ML ~~LOC~~ SOLN
30.0000 mg | SUBCUTANEOUS | Status: DC
  Administered 2020-06-27: 30 mg via SUBCUTANEOUS
  Filled 2020-06-27: qty 0.3

## 2020-06-27 MED ORDER — ASPIRIN EC 81 MG PO TBEC
81.0000 mg | DELAYED_RELEASE_TABLET | Freq: Every day | ORAL | Status: DC
  Administered 2020-06-27 – 2020-07-02 (×6): 81 mg via ORAL
  Filled 2020-06-27 (×6): qty 1

## 2020-06-27 MED ORDER — ACETAMINOPHEN 325 MG PO TABS: 650.0000 mg | ORAL_TABLET | Freq: Four times a day (QID) | ORAL | Status: DC | PRN

## 2020-06-27 MED ORDER — FUROSEMIDE 10 MG/ML IJ SOLN
40.0000 mg | Freq: Two times a day (BID) | INTRAMUSCULAR | Status: DC
  Administered 2020-06-27: 40 mg via INTRAVENOUS
  Filled 2020-06-27: qty 4

## 2020-06-27 MED ORDER — ONDANSETRON HCL 4 MG PO TABS: 4.0000 mg | ORAL_TABLET | Freq: Four times a day (QID) | ORAL | Status: DC | PRN

## 2020-06-27 MED ORDER — ATORVASTATIN CALCIUM 10 MG PO TABS
10.0000 mg | ORAL_TABLET | Freq: Every day | ORAL | Status: DC
  Administered 2020-06-27 – 2020-07-01 (×6): 10 mg via ORAL
  Filled 2020-06-27 (×6): qty 1

## 2020-06-27 MED ORDER — INSULIN ASPART 100 UNIT/ML ~~LOC~~ SOLN
0.0000 [IU] | Freq: Three times a day (TID) | SUBCUTANEOUS | Status: DC
  Administered 2020-06-27: 3 [IU] via SUBCUTANEOUS
  Administered 2020-06-27: 1 [IU] via SUBCUTANEOUS
  Administered 2020-06-27 – 2020-06-28 (×3): 2 [IU] via SUBCUTANEOUS
  Administered 2020-06-29: 1 [IU] via SUBCUTANEOUS
  Administered 2020-06-29: 2 [IU] via SUBCUTANEOUS
  Administered 2020-06-29: 3 [IU] via SUBCUTANEOUS
  Administered 2020-06-30 (×3): 2 [IU] via SUBCUTANEOUS
  Administered 2020-07-01: 3 [IU] via SUBCUTANEOUS
  Administered 2020-07-01: 2 [IU] via SUBCUTANEOUS
  Administered 2020-07-01: 3 [IU] via SUBCUTANEOUS
  Administered 2020-07-02: 2 [IU] via SUBCUTANEOUS

## 2020-06-27 MED ORDER — ONDANSETRON HCL 4 MG/2ML IJ SOLN: 4.0000 mg | Freq: Four times a day (QID) | INTRAMUSCULAR | Status: DC | PRN

## 2020-06-27 MED ORDER — ACETAMINOPHEN 650 MG RE SUPP: 650.0000 mg | Freq: Four times a day (QID) | RECTAL | Status: DC | PRN

## 2020-06-27 MED ORDER — FUROSEMIDE 10 MG/ML IJ SOLN
60.0000 mg | Freq: Two times a day (BID) | INTRAMUSCULAR | Status: DC
  Administered 2020-06-27 – 2020-06-30 (×7): 60 mg via INTRAVENOUS
  Filled 2020-06-27 (×8): qty 6

## 2020-06-27 MED ORDER — DOXAZOSIN MESYLATE 2 MG PO TABS
4.0000 mg | ORAL_TABLET | Freq: Every day | ORAL | Status: DC
  Administered 2020-06-27: 4 mg via ORAL
  Filled 2020-06-27: qty 2

## 2020-06-27 MED ORDER — AMIODARONE HCL 200 MG PO TABS
100.0000 mg | ORAL_TABLET | Freq: Every day | ORAL | Status: DC
  Administered 2020-06-27 – 2020-07-02 (×6): 100 mg via ORAL
  Filled 2020-06-27 (×6): qty 1

## 2020-06-27 MED ORDER — AMLODIPINE BESYLATE 5 MG PO TABS: 5.0000 mg | ORAL_TABLET | Freq: Every day | ORAL | Status: DC

## 2020-06-27 MED ORDER — INSULIN ASPART 100 UNIT/ML ~~LOC~~ SOLN
0.0000 [IU] | Freq: Every day | SUBCUTANEOUS | Status: DC
  Administered 2020-06-28: 3 [IU] via SUBCUTANEOUS

## 2020-06-27 MED ORDER — ENOXAPARIN SODIUM 40 MG/0.4ML ~~LOC~~ SOLN
40.0000 mg | SUBCUTANEOUS | Status: DC
  Administered 2020-06-28: 40 mg via SUBCUTANEOUS
  Filled 2020-06-27: qty 0.4

## 2020-06-27 NOTE — TOC Initial Note (Signed)
Transition of Care [facility]) - Initial/Assessment Note   Patient Details  Name: Franklin Gonzalez MRN: [id number] Date of Birth: 11/29/1921  Transition of Care [facility]) CM/SW Contact:    Sherie Don, LCSW Phone Number: 06/27/2020, 3:19 PM  Clinical Narrative: Patient is a 86 year old male who was admitted for acute CHF. Patient has a history of SVT, HTN, CAD, and CKD. PT evaluation recommended SNF. CSW met with patient and his son, Cortez Flippen, to discuss SNF vs. HH. Per son, patient would prefer to discharge home with [facility] as patient was independent with all ADLs prior to admission. CSW reviewed HH choices and son requested [facility]. CSW called Melissa with [facility] to make HHPT referral. Referral accepted. TOC to follow.  Expected Discharge Plan: [place] Barriers to Discharge: Continued Medical Work up  Patient Goals and CMS Choice Patient states their goals for this hospitalization and ongoing recovery are:: Discharge home with [facility] CMS Medicare.gov Compare Post Acute Care list provided to:: Patient Represenative (must comment) Nicholai Willette (son)) Choice offered to / list presented to : Adult Children  Expected Discharge Plan and Services Expected Discharge Plan: [place] In-house Referral: Clinical Social Work Discharge Planning Services: NA Post Acute Care Choice: [place] arrangements for the past 2 months: [place]: PT [place]: [place] ([place]) Date [place]: 06/27/20 Time [place]: [place] Representative spoke with at [place]: [place] Arrangements/Services Living arrangements for the past 2 months: [place] with:: Self Patient language and need for interpreter reviewed:: Yes Do you feel safe going back to the place where you live?: Yes      Need for Family Participation in Patient Care: Yes (Comment) Care giver support system in place?: Yes  (comment) Criminal Activity/Legal Involvement Pertinent to Current Situation/Hospitalization: No - Comment as needed  Activities of Daily Living Home Assistive Devices/Equipment: Cane (specify quad or straight), Eyeglasses ADL Screening (condition at time of admission) Patient's cognitive ability adequate to safely complete daily activities?: Yes Is the patient deaf or have difficulty hearing?: Yes Does the patient have difficulty seeing, even when wearing glasses/contacts?: No Does the patient have difficulty concentrating, remembering, or making decisions?: No Patient able to express need for assistance with ADLs?: Yes Does the patient have difficulty dressing or bathing?: Yes Independently performs ADLs?: Yes (appropriate for developmental age) Does the patient have difficulty walking or climbing stairs?: Yes Weakness of Legs: Both Weakness of Arms/Hands: None  Permission Sought/Granted Permission sought to share information with : Chartered certified accountant granted to share information with : Yes, Verbal Permission Granted Permission granted to share info w AGENCY: [facility]  Emotional Assessment Appearance:: Appears stated age Attitude/Demeanor/Rapport: Lethargic Orientation: : Oriented to Self, Oriented to Place, Oriented to  Time, Oriented to Situation Alcohol / Substance Use: Not Applicable Psych Involvement: No (comment)  Admission diagnosis:  Acute CHF (congestive heart failure) ([place]) [I50.9] Patient Active Problem List   Diagnosis Date Noted  . Acute respiratory failure with hypoxia ([place]) 06/27/2020  . Acute systolic CHF (congestive heart failure) ([place]) 06/27/2020  . CKD (chronic kidney disease) stage 3, GFR 30-59 ml/min (HCC) 06/27/2020  . Hyponatremia 06/27/2020  . Acute CHF (congestive heart failure) ([place]) 06/26/2020  . Cerebrovascular disease 04/10/2013  . CAD (coronary artery disease) 10/27/2012  . Bruit 10/27/2012  . Hyperlipidemia 10/27/2012   . Fever 10/07/2012  . Anemia, normocytic normochromic 10/07/2012  . SVT (supraventricular  tachycardia) ([place]) 10/03/2012  . Fasting hyperglycemia   . HTN (hypertension)   . Acute myocardial infarction, subendocardial infarction, initial episode of care [facility]) 10/02/2012   PCP:  Rory Percy, MD Pharmacy:   [facility] [address] [place] [address] Phone: [phone] Fax: [phone]  Readmission Risk Interventions No flowsheet data found.

## 2020-06-27 NOTE — Plan of Care (Signed)
  Problem: Acute Rehab PT Goals(only PT should resolve) Goal: Patient Will Transfer Sit To/From Stand Outcome: Progressing Flowsheets (Taken 06/27/2020 0919) Patient will transfer sit to/from stand: with supervision Goal: Pt Will Transfer Bed To Chair/Chair To Bed Outcome: Progressing Flowsheets (Taken 06/27/2020 0919) Pt will Transfer Bed to Chair/Chair to Bed: with supervision Goal: Pt Will Perform Standing Balance Or Pre-Gait Outcome: Progressing Flowsheets (Taken 06/27/2020 0919) Pt will perform standing balance or pre-gait: with Supervision Goal: Pt Will Ambulate Outcome: Progressing Flowsheets (Taken 06/27/2020 0919) Pt will Ambulate:  50 feet  with min guard assist  with rolling walker Goal: Pt/caregiver will Perform Home Exercise Program Outcome: Progressing Flowsheets (Taken 06/27/2020 0919) Pt/caregiver will Perform Home Exercise Program:  For increased strengthening  For improved balance  Independently  9:20 AM, 06/27/20 Wyman Songster PT, DPT Physical Therapist at [facility]

## 2020-06-27 NOTE — Progress Notes (Signed)
*  PRELIMINARY RESULTS* Echocardiogram 2D Echocardiogram has been performed.  Jeryl Columbia 06/27/2020, 3:57 PM

## 2020-06-27 NOTE — Consult Note (Addendum)
Cardiology Consultation:   Patient ID: Franklin Gonzalez; [id number]; 12/19/1921   Admit date: 06/26/2020 Date of Consult: 06/27/2020  Primary Care Provider: Selinda Flavin, MD Primary Cardiologist: Olga Millers, MD 05/22/2019 Louie Casa, NP 12/17/2019 Primary Electrophysiologist:  None   Patient Profile:   Franklin Gonzalez is a 86 y.o. male with a hx of CABG 1994, NSTEMI 2014 s/p DES SVG-OM2 & SVG-PDA, DM, HTN, SVT, anemia, who is being seen today for the evaluation of CHF at the request of Dr Tat.  History of Present Illness:   [name] fell and broke his leg on 10/8, that his hurt his ability to ambulate.  Prior to that, he had been doing his ADLs and small tasks around the house.  He was not having problems with chest pain or shortness of breath.   Shortly after the ER visit, he developed shortness of breath and cough and saw his PCP.  He was put on antibiotics for possible infection.  He developed nausea, vomiting and diarrhea, but the symptoms resolved.  Even after completing antibiotics, his shortness of breath continued.  He has noticed lower extremity edema and abdominal swelling as well.    As his symptoms progressed, he developed orthopnea and PND.  He was also wheezing.  He continued to cough, the cough was productive of clear/white sputum.  He was not having chest pain or palpitations, no presyncope or syncope.  Finally, the symptoms progressed to the point that he needed treatment.  He went to [facility] on 10/21.  Weight was 170 pounds.  O2 saturation was less than 90% on room air.  He was given IV Lasix 40 mg and started on IV Lasix 40 mg twice daily.  [name] does not know why he was gaining the weight. He is compliant with medications.  He states his baseline weight is 159 pounds.  He does not add salt to foods, but admits he eats out and eats commercially prepared foods at times, so may be getting hidden salt.   Past Medical History:    Diagnosis Date  . Bifascicular block   . Coronary artery disease    CABG 1993 at [facility]; NSTEMI 09/2012 s/p DES to SVG-OM1, DES to SVG-PDA   . DM (diabetes mellitus) (HCC)   . HTN (hypertension)   . SVT (supraventricular tachycardia) (HCC)     Past Surgical History:  Procedure Laterality Date  . CARDIAC SURGERY     1993 CABG  . LEFT HEART CATHETERIZATION WITH CORONARY/GRAFT ANGIOGRAM N/A 10/04/2012   Procedure: LEFT HEART CATHETERIZATION WITH Isabel Caprice;  Surgeon: Tonny Bollman, MD;  Location: [facility] CATH LAB;  Service: Cardiovascular;  Laterality: N/A;     Prior to Admission medications   Medication Sig Start Date End Date Taking? Authorizing Provider  amiodarone (PACERONE) 200 MG tablet Take 1/2 (one-half) tablet by mouth once daily 06/05/20  Yes Crenshaw, Madolyn Frieze, MD  amLODipine (NORVASC) 5 MG tablet TAKE ONE TABLET BY MOUTH ONCE DAILY 06/27/17  Yes Lewayne Bunting, MD  aspirin EC 81 MG EC tablet Take 1 tablet (81 mg total) by mouth daily. 10/07/12  Yes Edmisten, Brooke O, PA-C  atorvastatin (LIPITOR) 10 MG tablet Take 10 mg by mouth at bedtime.   Yes [provider]  cephALEXin (KEFLEX) 500 MG capsule Take 500 mg by mouth 3 (three) times daily. 06/17/20  Yes [provider]  doxazosin (CARDURA) 4 MG tablet Take 4 mg by mouth at bedtime.   Yes [provider]  levothyroxine (SYNTHROID) 25 MCG tablet Take 1 tablet (25 mcg total) by mouth daily before breakfast. 10/20/18  Yes Crenshaw, Madolyn Frieze, MD  metFORMIN (GLUCOPHAGE-XR) 500 MG 24 hr tablet Take 1 tablet by mouth daily. 09/21/13  Yes [provider]  nitroGLYCERIN (NITROSTAT) 0.4 MG SL tablet Place 1 tablet (0.4 mg total) under the tongue every 5 (five) minutes x 3 doses as needed for chest pain. 10/07/12  Yes Edmisten, Herby Abraham, PA-C  ondansetron (ZOFRAN) 4 MG tablet Take by mouth. 06/17/20  Yes [provider]    Inpatient Medications: Scheduled Meds: . amiodarone   100 mg Oral Daily  . aspirin EC  81 mg Oral Daily  . atorvastatin  10 mg Oral QHS  . enoxaparin (LOVENOX) injection  30 mg Subcutaneous Q24H  . furosemide  40 mg Intravenous Q12H  . guaiFENesin-dextromethorphan  10 mL Oral Q8H  . influenza vaccine adjuvanted  0.5 mL Intramuscular Tomorrow-1000  . insulin aspart  0-5 Units Subcutaneous QHS  . insulin aspart  0-9 Units Subcutaneous TID WC   Continuous Infusions:  PRN Meds: acetaminophen **OR** acetaminophen, ondansetron **OR** ondansetron (ZOFRAN) IV  Allergies:   No Known Allergies  Social History:   Social History   Socioeconomic History  . Marital status: Married    Spouse name: Not on file  . Number of children: Not on file  . Years of education: Not on file  . Highest education level: Not on file  Occupational History  . Not on file  Tobacco Use  . Smoking status: Never Smoker  . Smokeless tobacco: Never Used  Substance and Sexual Activity  . Alcohol use: No    Alcohol/week: 0.0 standard drinks  . Drug use: No  . Sexual activity: Not on file  Other Topics Concern  . Not on file  Social History Narrative  . Not on file   Social Determinants of Health   Financial Resource Strain:   . Difficulty of Paying Living Expenses: Not on file  Food Insecurity:   . Worried About Programme researcher, broadcasting/film/video in the Last Year: Not on file  . Ran Out of Food in the Last Year: Not on file  Transportation Needs:   . Lack of Transportation (Medical): Not on file  . Lack of Transportation (Non-Medical): Not on file  Physical Activity:   . Days of Exercise per Week: Not on file  . Minutes of Exercise per Session: Not on file  Stress:   . Feeling of Stress : Not on file  Social Connections:   . Frequency of Communication with Friends and Family: Not on file  . Frequency of Social Gatherings with Friends and Family: Not on file  . Attends Religious Services: Not on file  . Active Member of Clubs or Organizations: Not on file  .  Attends Banker Meetings: Not on file  . Marital Status: Not on file  Intimate Partner Violence:   . Fear of Current or Ex-Partner: Not on file  . Emotionally Abused: Not on file  . Physically Abused: Not on file  . Sexually Abused: Not on file    Family History:   Family History  Problem Relation Age of Onset  . CAD Other        multiple relatives including father and son   Family Status:  Family Status  Relation Name Status  . Mother  Deceased  . Father  Deceased  . Other  (Not Specified)  . MGM  Deceased  .  MGF  Deceased  . PGM  Deceased  . PGF  Deceased    ROS:  Please see the history of present illness.  All other ROS reviewed and negative.     Physical Exam/Data:   Vitals:   06/27/20 0039 06/27/20 0300 06/27/20 0500 06/27/20 0802  BP:  108/67  131/78  Pulse:  77  74  Resp:  (!) 22  20  Temp:  98.6 F (37 C)  98.5 F (36.9 C)  TempSrc:  Oral  Oral  SpO2: 95% 93%  96%  Weight:   77.6 kg   Height:        Intake/Output Summary (Last 24 hours) at 06/27/2020 0917 Last data filed at 06/27/2020 0342 Gross per 24 hour  Intake 614.58 ml  Output 650 ml  Net -35.42 ml    Last 3 Weights 06/27/2020 06/26/2020 06/26/2020  Weight (lbs) 171 lb 1.2 oz 164 lb 7.4 oz 155 lb  Weight (kg) 77.6 kg 74.6 kg 70.308 kg     Body mass index is 23.86 kg/m.   General:  Well nourished, well developed, male in no acute distress HEENT: normal Lymph: no adenopathy Neck: JVD - 10 cm Endocrine:  No thryomegaly Vascular: No carotid bruits; 4/4 extremity pulses 2+  Cardiac:  normal S1, S2; RRR; no murmur Lungs: Rales bilaterally, inspiratory and expiratory wheezing, no rhonchi  Abd: Firm, a little tender, no hepatomegaly  Ext: 1-2+ edema Musculoskeletal:  No deformities, BUE and BLE strength weak but equal Skin: warm and dry  Neuro:  CNs 2-12 intact, no focal abnormalities noted Psych:  Normal affect   EKG:  The EKG was personally reviewed and demonstrates:  Sinus rhythm, heart rate 77, RBBB and LAFB with QRS duration 153 ms, QTC 552 ms.  2019 ECG with similar morphology, QRS duration 140 ms, QTC 496 ms Telemetry:  Telemetry was personally reviewed and demonstrates: Sinus rhythm, PVCs and some pairs   CV studies:   ECHO: 10/03/2012 Left ventricle: Poor endocardial definition Inferior wall  appears hypokinetic The cavity size was mildly dilated. Wall  thickness was normal. Systolic function was mildly reduced.  The estimated ejection fraction was in the range of 45% to  50%.    CATH: 10/04/2012 Left mainstem: total occlusion  Left anterior descending (LAD): fills from LIMA. Total occlusion of prox LAD. Mid vessel patent with nonobstructive disease  Left circumflex (LCx): OM Maxmilian Trostel fills from graft. Prox LCx total occlusion.  LIMA to LAD: Widely patent  SVG to OM: The proximal graft has 40% stenosis. The mid body of the graft has a 95% eccentric stenosis. The distal anastomotic site is patent.  SVG to diagonal: Widely patent throughout with minor irregularity  SVG to RCA: Patent graft with severe 99% stenosis in the distal body the graft with intraluminal filling defect or thrombus.  Right coronary artery (RCA): Small but dominant diffusely diseased vessel. 95% mid vessel stenosis noted. There is diffuse 50-60% stenosis of the entire RCA. The distal Keante Urizar vessels of the RCA fill competitively from the graft flow.  Left ventriculography: Deferred. PCI Data: Lesion 1 Vessel - OM 1/Segment - saphenous vein graft mid body Percent Stenosis (pre)  95 TIMI-flow 3 Stent 3.5 x 16 mm drug-eluting Percent Stenosis (post) 0 TIMI-flow (post) 3  Lesion 2 Vessel - PDA/Segment - saphenous vein graft distal body Percent Stenosis (pre)  99 TIMI-flow 3 Stent 3.5 x 16 mm drug-eluting Percent Stenosis (post) 0 TIMI-flow (post) 3  Final Conclusions:   1. Severe native  coronary artery disease with total occlusion left main and severe  stenosis of the RCA 2. Status post aortocoronary bypass surgery with continued patency of the saphenous vein graft to diagonal and LIMA to LAD 3. Severe stenoses of the saphenous vein graft to OM and saphenous vein graft to PDA, both successfully treated with drug-eluting stents  Recommendations: Dual antiplatelet therapy with aspirin and Plavix for a minimum of 12 months and consider long-term in the setting of his multiple vein graft disease.  Laboratory Data:   Chemistry Recent Labs  Lab 06/26/20 1739 06/27/20 0352  NA 131* 133*  K 5.2* 4.7  CL 99 102  CO2 21* 22  GLUCOSE 226* 223*  BUN 53* 53*  CREATININE 1.56* 1.43*  CALCIUM 8.9 8.2*  GFRNONAA 40* 45*  ANIONGAP 11 9    Lab Results  Component Value Date   ALT 44 06/26/2020   AST 40 06/26/2020   ALKPHOS 90 06/26/2020   BILITOT 0.7 06/26/2020   Hematology Recent Labs  Lab 06/26/20 1739  WBC 8.8  RBC 3.73*  HGB 11.3*  HCT 35.2*  MCV 94.4  MCH 30.3  MCHC 32.1  RDW 13.6  PLT 285   Cardiac Enzymes High Sensitivity Troponin:   Recent Labs  Lab 06/26/20 1739 06/26/20 1945  TROPONINIHS 648* 571*      BNP Recent Labs  Lab 06/26/20 1739  BNP 3,447.0*    TSH:  Lab Results  Component Value Date   TSH 22.170 (H) 10/19/2018   Lipids: Lab Results  Component Value Date   CHOL 132 04/14/2018   HDL 66 04/14/2018   LDLCALC 52 04/14/2018   TRIG 68 04/14/2018   CHOLHDL 2.0 04/14/2018   HgbA1c: Lab Results  Component Value Date   HGBA1C 7.0 (H) 10/03/2012   Magnesium:  Magnesium  Date Value Ref Range Status  06/26/2020 3.0 (H) 1.7 - 2.4 mg/dL Final    Comment:    Performed at [facility], [address], [place], [place] [address]     Radiology/Studies:  [facility] Chest Port 1 View  Result Date: 06/26/2020 CLINICAL DATA:  Cough EXAM: PORTABLE CHEST 1 VIEW COMPARISON:  06/26/2020, 10/26/2018 FINDINGS: Post sternotomy changes. Small left greater than right pleural effusion with dense consolidation left  lung base. Enlarged cardiomediastinal silhouette with mild diffuse interstitial opacity suspicious for pulmonary edema. Aortic atherosclerosis. No pneumothorax. IMPRESSION: Small left greater than right pleural effusions with dense consolidation at the left lung base, atelectasis versus pneumonia. Cardiomegaly with mild diffuse interstitial opacity suspect for mild pulmonary edema. Electronically Signed   By: Jasmine Pang M.D.   On: 06/26/2020 18:03    Assessment and Plan:   1.  Acute CHF -Last EF assessment was by echo in 2014, EF was 45-50% -Repeat echo pending, BUN is very high, raising concern that EF is down -He has significant volume overload by exam -Weight today was 171 pounds, baseline weight is 159. -Difficult to tell if he is diuresing well but may be, 650 cc output recorded so far but this is incomplete -Continue diuresis with a goal of intake/output being negative by 1 L daily -BUN is unchanged, creatinine slight trend downward overnight, continue to follow daily -Suspect the wheezing may be cardiac asthma as he has no history of COPD  2.  CKD stage III -10/2018 BUN/creatinine were 23/1.15, 53/1.56 on admission -Slight trend downward overnight, hopefully, creatinine will continue to improve with diuresis -Follow BMET daily  3.  Elevated troponin -History of CAD, with no cath  or stress test since 2018 when he had stents to 2 vein grafts -Prior to his acute illness, he was not having problems with chest pain or shortness of breath with exertion, although his exertional level was low -Suspect this is all demand ischemia, but if his EF is newly decreased, may need an ischemic eval once his volume status is improved  4.  SVT, RBBB, LAFB -PVCs and pairs seen, heart rate is elevated secondary to acute illness -QRS duration is increased and QTc is increased since last ECG -Limit QT prolonging medications and follow   Otherwise, per IM Principal Problem:   Acute CHF (congestive  heart failure) (HCC) Active Problems:   SVT (supraventricular tachycardia) (HCC)   HTN (hypertension)   CAD (coronary artery disease)   Acute respiratory failure with hypoxia (HCC)   Acute systolic CHF (congestive heart failure) (HCC)   CKD (chronic kidney disease) stage 3, GFR 30-59 ml/min (HCC)   Hyponatremia     For questions or updates, please contact [facility] Please consult [email or web address] for contact info under Cardiology/STEMI.   Melida QuitterSigned, Rhonda Barrett, PA-C  06/27/2020 9:17 AM  Attending note  Patient seen and discussed with PA Barrett, I agree with her documentation. 86 yo male history of CAD with prior CABG in 1993, subsequent PCI to SVG-RCA and SVG-OM in 2014, PSVT on amio, DM2, HTN, presented with weakness, cough, SOB, LE edema and abdominal distension x 1 week.    ER vitals: p 83 bp 104/91 95% RA WBC 8.8 Hgb 11.3 Plt 285 K 5.2 Cr 1.56 BNP 3447 Pro calcitonin <0.10 Mg 3 hstrop 648-->571--> COVID neg CXR edema, LLL base consolidation Echo pending EKG SR, RBBB, LAFB, 1st degree av block   Presents with acute heart failure, unknown type at this time. Edema on xray, markedly elevated BNP. Limited data thus far on I/Os as just admitted, he is on IV lasix 40mg  bid, Cr trending down with diuresis consistent with venous congestion and CHF. If systolic dysfunction by echo would avoid beta blocker given trifascicular block on EKG, renal dysfunctin would also limit medical therapy. Continue IV diuresis, increase to 60mg  IV bid. . Elevated troponin trending down in setting of HF, follow up echo. 89 but lives alone, independent in ADLs and no cognitive decline. Depending on echo would be a conversation whether to consider a cath. Would need to be euvolemic prior to consideration, will take several days to diurese, I mentioned to family and patient to start thinking if they would want to consider invasive procedures.    Dina RichJonathan Veneta Sliter MD

## 2020-06-27 NOTE — Progress Notes (Signed)
Echo shows severe LV systolic dysfunction LVEF 25%, new findings. Over the weekend continue aggressive diuresis. 86 yo would favor medical therapy alone, however he lives independently, independent in all ADLs with no cognitive decline. Will be an going discussion with family how aggressive to be, I had mentioned at initial evaluation prior to echo findigns that he would need to start considering this. Continue diuresis over the weekend, with trifascicular block no beta blocker, elevated Cr no ACE/ARB/ARNI at this time though could consider low dose trial in next few days.    Dina Rich MD

## 2020-06-27 NOTE — Progress Notes (Signed)
PROGRESS NOTE  Franklin Gonzalez MVH:[id number] DOB: 02/10/1922 DOA: 06/26/2020 PCP: Selinda Flavin, MD  Brief History:  86 year old male with a history of ischemic cardiomyopathy with EF 45-50%, SVT, coronary artery disease with history of MI, hypertension, diabetes mellitus type 2 presenting with approximately 2-week history of shortness of breath.  On 06/13/2020 the patient had a mechanical fall resulting in a fracture of the right first metatarsal of the right foot.  He went to the ED at [facility].  X-rays at that time showed a possible nondisplaced intra-articular fracture of the first metatarsal.  The patient was placed in a surgical shoe and sent home.  He has been using a walker and a cane since then, and he is still been able to bear weight.  However, the patient developed low-grade temperatures with coughing and shortness of breath approximately 1 to 2 days after this ED visit.  He had some generalized weakness at that time.  He went to his PCP.  The patient was placed on cephalexin for presumptive UTI.  Unfortunately, he developed some loose stools and had some nausea and vomiting.  The nausea, vomiting, and diarrhea ultimately resolved, but the patient continued to have shortness of breath and generalized weakness.  The son also noted increasing lower extremity edema and increasing abdominal girth.  The patient denied any frank fevers, chills, chest pain, headache, hemoptysis.  He does complain of some orthopnea type symptoms. Notably, the patient eats carry out most days of the week. In the emergency department, the patient was afebrile hemodynamically stable with oxygen saturation 95% on room air.  BMP showed a sodium 131 with serum creatinine 1.56.  WBC was 8.8 with hemoglobin 11.3 and platelets 285,000.  AST 40, ALT 44, phosphatase 90, total bilirubin 0.7.  Troponin was 648>> 571.  BNP 3447  Assessment/Plan: Acute systolic CHF -09/25/2012 echo EF 45-50%, inferior wall HK,  trivial TR -Continue IV furosemide 40 mg twice daily -Daily weights -Add beta-blocker as BP allows -Cardiology consult -CKD limits ACEi/ARB,ARNI -Repeat echo -Discontinue amlodipine  Acute respiratory failure with hypoxia -Currently stable on 2 L nasal cannula -Secondary to pulmonary edema -Wean oxygen as tolerated -PCT <0.10 -Personally reviewed chest x-ray; bilateral pleural effusion, increased interstitial markings, vascular congestion  CKD stage IIIb -Baseline creatinine 1.1-1.4  SVT -Currently in sinus rhythm -Personally reviewed EKG--sinus rhythm,RBBB  Elevated troponin/coronary artery disease -Secondary to fluid overload -No chest pain  -Echocardiogram -Continue aspirin and statin  Essential hypertension -Holding amlodipine and Cardura to allow for blood pressure margin for diuresis  Diabetes mellitus type 2 -Holding Metformin -Hemoglobin A1c  Right foot fracture -PT evaluation  Hyponatremia -Secondary to CHF -Continue diuresis  Hyperlipidemia -continue statin   Status is: Inpatient  Remains inpatient appropriate because:IV treatments appropriate due to intensity of illness or inability to take PO   Dispo: The patient is from: Home              Anticipated d/c is to: Home              Anticipated d/c date is: 2 days              Patient currently is not medically stable to d/c.        Family Communication:   Son updated 10/22  Consultants:  cardiology  Code Status:  FULL  DVT Prophylaxis:  [place] Lovenox   Procedures: As Listed in Progress Note Above  Antibiotics: Ceftriaxone 10/21 Doxy 10/21  Subjective: Pt continues to have sob, but a little better than yesterday.  He has orthopnea.  Denies f/c, cp, n/v/d, abd pain.  Objective: Vitals:   06/26/20 2353 06/27/20 0039 06/27/20 0300 06/27/20 0500  BP: 119/70  108/67   Pulse: 89  77   Resp: 20  (!) 22   Temp: 98.2 F (36.8 C)  98.6 F (37 C)   TempSrc: Oral  Oral    SpO2: 95% 95% 93%   Weight: 74.6 kg   77.6 kg  Height:        Intake/Output Summary (Last 24 hours) at 06/27/2020 0753 Last data filed at 06/27/2020 0342 Gross per 24 hour  Intake 614.58 ml  Output 650 ml  Net -35.42 ml   Weight change:  Exam:   General:  Pt is alert, follows commands appropriately, not in acute distress  HEENT: No icterus, No thrush, No neck mass, [place]/AT  Cardiovascular: RRR, S1/S2, no rubs, no gallops  Respiratory: bibasilar crackles. No wheeze  Abdomen: Soft/+BS, non tender, non distended, no guarding  Extremities: 2+LE edema, No lymphangitis, No petechiae, No rashes, no synovitis   Data Reviewed: I have personally reviewed following labs and imaging studies Basic Metabolic Panel: Recent Labs  Lab 06/26/20 1739 06/26/20 1945 06/27/20 0352  NA 131*  --  133*  K 5.2*  --  4.7  CL 99  --  102  CO2 21*  --  22  GLUCOSE 226*  --  223*  BUN 53*  --  53*  CREATININE 1.56*  --  1.43*  CALCIUM 8.9  --  8.2*  MG  --  3.0*  --    Liver Function Tests: Recent Labs  Lab 06/26/20 1739  AST 40  ALT 44  ALKPHOS 90  BILITOT 0.7  PROT 6.7  ALBUMIN 3.7   No results for input(s): LIPASE, AMYLASE in the last 168 hours. No results for input(s): AMMONIA in the last 168 hours. Coagulation Profile: No results for input(s): INR, PROTIME in the last 168 hours. CBC: Recent Labs  Lab 06/26/20 1739  WBC 8.8  NEUTROABS 7.1  HGB 11.3*  HCT 35.2*  MCV 94.4  PLT 285   Cardiac Enzymes: No results for input(s): CKTOTAL, CKMB, CKMBINDEX, TROPONINI in the last 168 hours. BNP: Invalid input(s): POCBNP CBG: Recent Labs  Lab 06/27/20 0121  GLUCAP 199*   HbA1C: No results for input(s): HGBA1C in the last 72 hours. Urine analysis:    Component Value Date/Time   COLORURINE YELLOW 10/03/2012 1151   APPEARANCEUR CLEAR 10/03/2012 1151   LABSPEC 1.022 10/03/2012 1151   PHURINE 6.0 10/03/2012 1151   GLUCOSEU NEGATIVE 10/03/2012 1151   HGBUR MODERATE  (A) 10/03/2012 1151   BILIRUBINUR SMALL (A) 10/03/2012 1151   KETONESUR 40 (A) 10/03/2012 1151   PROTEINUR NEGATIVE 10/03/2012 1151   UROBILINOGEN 1.0 10/03/2012 1151   NITRITE NEGATIVE 10/03/2012 1151   LEUKOCYTESUR NEGATIVE 10/03/2012 1151   Sepsis Labs: @LABRCNTIP (procalcitonin:4,lacticidven:4) ) Recent Results (from the past 240 hour(s))  Respiratory Panel by RT PCR (Flu A&B, Covid) - Nasopharyngeal Swab     Status: None   Collection Time: 06/26/20  7:06 PM   Specimen: Nasopharyngeal Swab  Result Value Ref Range Status   SARS Coronavirus 2 by RT PCR NEGATIVE NEGATIVE Final    Comment: (NOTE) SARS-CoV-2 target nucleic acids are NOT DETECTED.  The SARS-CoV-2 RNA is generally detectable in upper respiratoy specimens during the acute phase of infection. The lowest concentration of SARS-CoV-2 viral copies this assay  can detect is 131 copies/mL. A negative result does not preclude SARS-Cov-2 infection and should not be used as the sole basis for treatment or other patient management decisions. A negative result may occur with  improper specimen collection/handling, submission of specimen other than nasopharyngeal swab, presence of viral mutation(s) within the areas targeted by this assay, and inadequate number of viral copies (<131 copies/mL). A negative result must be combined with clinical observations, patient history, and epidemiological information. The expected result is Negative.  Fact Sheet for Patients:  https://www.moore.com/  Fact Sheet for Healthcare Providers:  https://www.young.biz/  This test is no t yet approved or cleared by the [place] FDA and  has been authorized for detection and/or diagnosis of SARS-CoV-2 by FDA under an Emergency Use Authorization (EUA). This EUA will remain  in effect (meaning this test can be used) for the duration of the COVID-19 declaration under Section 564(b)(1) of the Act, 21  U.S.C. section 360bbb-3(b)(1), unless the authorization is terminated or revoked sooner.     Influenza A by PCR NEGATIVE NEGATIVE Final   Influenza B by PCR NEGATIVE NEGATIVE Final    Comment: (NOTE) The Xpert Xpress SARS-CoV-2/FLU/RSV assay is intended as an aid in  the diagnosis of influenza from Nasopharyngeal swab specimens and  should not be used as a sole basis for treatment. Nasal washings and  aspirates are unacceptable for Xpert Xpress SARS-CoV-2/FLU/RSV  testing.  Fact Sheet for Patients: https://www.moore.com/  Fact Sheet for Healthcare Providers: https://www.young.biz/  This test is not yet approved or cleared by the [place] FDA and  has been authorized for detection and/or diagnosis of SARS-CoV-2 by  FDA under an Emergency Use Authorization (EUA). This EUA will remain  in effect (meaning this test can be used) for the duration of the  Covid-19 declaration under Section 564(b)(1) of the Act, 21  U.S.C. section 360bbb-3(b)(1), unless the authorization is  terminated or revoked. Performed at [facility], [address], [place], [place] [address]      Scheduled Meds: . amiodarone  100 mg Oral Daily  . aspirin EC  81 mg Oral Daily  . atorvastatin  10 mg Oral QHS  . enoxaparin (LOVENOX) injection  30 mg Subcutaneous Q24H  . furosemide  40 mg Intravenous Q12H  . guaiFENesin-dextromethorphan  10 mL Oral Q8H  . influenza vaccine adjuvanted  0.5 mL Intramuscular Tomorrow-1000  . insulin aspart  0-5 Units Subcutaneous QHS  . insulin aspart  0-9 Units Subcutaneous TID WC   Continuous Infusions: . cefTRIAXone (ROCEPHIN)  IV 1 g (06/27/20 0122)  . doxycycline (VIBRAMYCIN) IV 100 mg (06/27/20 0135)    Procedures/Studies: DG Chest Port 1 View  Result Date: 06/26/2020 CLINICAL DATA:  Cough EXAM: PORTABLE CHEST 1 VIEW COMPARISON:  06/26/2020, 10/26/2018 FINDINGS: Post sternotomy changes. Small left greater than right pleural  effusion with dense consolidation left lung base. Enlarged cardiomediastinal silhouette with mild diffuse interstitial opacity suspicious for pulmonary edema. Aortic atherosclerosis. No pneumothorax. IMPRESSION: Small left greater than right pleural effusions with dense consolidation at the left lung base, atelectasis versus pneumonia. Cardiomegaly with mild diffuse interstitial opacity suspect for mild pulmonary edema. Electronically Signed   By: Jasmine Pang M.D.   On: 06/26/2020 18:03    Catarina Hartshorn, DO  [facility]  If 7PM-7AM, please contact night-coverage [email or web address] Password [email or web address] 06/27/2020, 7:53 AM   LOS: 1 day

## 2020-06-27 NOTE — Evaluation (Signed)
Physical Therapy Evaluation Patient Details Name: Franklin Gonzalez MRN: [id number] DOB: [birth year] Today's Date: 06/27/2020   History of Present Illness  Franklin Gonzalez is a 86 y.o. male with medical history significant for CABG, SVT, hypertension, diabetes mellitus.Patient presented to the ED with complaints of weakness, cough, difficulty breathing.  Patient son is at bedside and assist with the history.  Difficulty breathing started about a week ago, also with cough productive of clear sputum.  No fever no chills.  Patient has had poor appetite, and is weak.Patient's son also reports over the past 2 weeks, patient has had progressive swelling of his bilateral lower extremities, such that he is unable to wear his footwear.  He also has swelling involving his pelvis and abdomen such that he is not even able to wear his pants.     Clinical Impression  Patient limited for functional mobility as stated below secondary to BLE weakness, fatigue and impaired standing balance. Patient does not require assist for bed mobility. Patient shows good sitting tolerance and sitting balance EOB while on 2L O2 with sat at 95%. Patient transfers to standing with RW with min assist and unsteady upon initial standing. Patient ambulates with slow, labored cadence with RW with O2 at 88% on room air with SOB. Patient fatigued at end of session and returned to bed. Patient will benefit from continued physical therapy in hospital and recommended venue below to increase strength, balance, endurance for safe ADLs and gait.     Follow Up Recommendations SNF;Supervision - Intermittent;Supervision for mobility/OOB    Equipment Recommendations  None recommended by PT    Recommendations for Other Services       Precautions / Restrictions Precautions Precautions: Fall Precaution Comments: foot fracture Required Braces or Orthoses:  (ortho shoe) Restrictions Weight Bearing Restrictions: No      Mobility  Bed  Mobility Overal bed mobility: Modified Independent             General bed mobility comments: slow, labored transition to seated EOB, O2 95% on 2L at rest, O2 at 92-95% on room air at rest    Transfers Overall transfer level: Needs assistance Equipment used: Rolling walker (2 wheeled) Transfers: Sit to/from [employer] Sit to Stand: Min assist Stand pivot transfers: Min assist       General transfer comment: transfer to standing with RW, requies assist for weakness, unsteady upon initial standing  Ambulation/Gait Ambulation/Gait assistance: Min assist;Mod assist Gait Distance (Feet): 20 Feet Assistive device: Rolling walker (2 wheeled) Gait Pattern/deviations: Decreased step length - right;Decreased step length - left;Decreased stride length Gait velocity: decreased   General Gait Details: slow, labored cadence with RW, O2 sat at 88% on room air with ambulation with SOB  Stairs            Wheelchair Mobility    Modified Rankin (Stroke Patients Only)       Balance Overall balance assessment: Needs assistance Sitting-balance support: No upper extremity supported Sitting balance-Leahy Scale: Good Sitting balance - Comments: seated EOB     Standing balance-Leahy Scale: Fair Standing balance comment: fair/poor with RW                             Pertinent Vitals/Pain Pain Assessment: No/denies pain    Home Living Family/patient expects to be discharged to:: Private residence Living Arrangements: Alone Available Help at Discharge: Family;Available PRN/intermittently Type of Home: House Home Access: Stairs to enter Entrance Stairs-Rails: None  Entrance Stairs-Number of Steps: 1 Home Layout: Two level;Able to live on main level with bedroom/bathroom Home Equipment: Dan Humphreys - 2 wheels;Cane - single point;Toilet riser      Prior Function Level of Independence: Independent with assistive device(s)         Comments: Patient  states independent with ADL, was ambulating with SPC but currently using RW due to foot fracture     Hand Dominance        Extremity/Trunk Assessment   Upper Extremity Assessment Upper Extremity Assessment: Generalized weakness    Lower Extremity Assessment Lower Extremity Assessment: Generalized weakness       Communication   Communication: No difficulties  Cognition Arousal/Alertness: Awake/alert Behavior During Therapy: WFL for tasks assessed/performed Overall Cognitive Status: Within Functional Limits for tasks assessed                                        General Comments      Exercises     Assessment/Plan    PT Assessment Patient needs continued PT services  PT Problem List Decreased strength;Decreased mobility;Decreased activity tolerance;Decreased balance;Decreased knowledge of use of DME;Cardiopulmonary status limiting activity       PT Treatment Interventions DME instruction;Therapeutic exercise;Gait training;Balance training;Stair training;Neuromuscular re-education;Functional mobility training;Therapeutic activities;Patient/family education    PT Goals (Current goals can be found in the Care Plan section)  Acute Rehab PT Goals Patient Stated Goal: get stronger PT Goal Formulation: With patient Time For Goal Achievement: 07/11/20 Potential to Achieve Goals: Good    Frequency Min 3X/week   Barriers to discharge        Co-evaluation               AM-PAC PT "6 Clicks" Mobility  Outcome Measure Help needed turning from your back to your side while in a flat bed without using bedrails?: None Help needed moving from lying on your back to sitting on the side of a flat bed without using bedrails?: A Little Help needed moving to and from a bed to a chair (including a wheelchair)?: A Little Help needed standing up from a chair using your arms (e.g., wheelchair or bedside chair)?: A Little Help needed to walk in hospital room?: A  Lot Help needed climbing 3-5 steps with a railing? : A Lot 6 Click Score: 17    End of Session Equipment Utilized During Treatment: Gait belt;Oxygen Activity Tolerance: Patient tolerated treatment well Patient left: in bed;with call bell/phone within reach;with bed alarm set Nurse Communication: Mobility status PT Visit Diagnosis: Unsteadiness on feet (R26.81);Other abnormalities of gait and mobility (R26.89);Muscle weakness (generalized) (M62.81)    Time: 0802-0822 PT Time Calculation (min) (ACUTE ONLY): 20 min   Charges:   PT Evaluation $PT Eval Moderate Complexity: 1 Mod PT Treatments $Therapeutic Activity: 8-22 mins        9:15 AM, 06/27/20 Wyman Songster PT, DPT Physical Therapist at [facility]

## 2020-06-28 DIAGNOSIS — I5043 Acute on chronic combined systolic (congestive) and diastolic (congestive) heart failure: Secondary | ICD-10-CM

## 2020-06-28 DIAGNOSIS — I5041 Acute combined systolic (congestive) and diastolic (congestive) heart failure: Secondary | ICD-10-CM

## 2020-06-28 LAB — BASIC METABOLIC PANEL
Anion gap: 11 (ref 5–15)
BUN: 51 mg/dL — ABNORMAL HIGH (ref 8–23)
CO2: 24 mmol/L (ref 22–32)
Calcium: 8.1 mg/dL — ABNORMAL LOW (ref 8.9–10.3)
Chloride: 98 mmol/L (ref 98–111)
Creatinine, Ser: 1.58 mg/dL — ABNORMAL HIGH (ref 0.61–1.24)
GFR, Estimated: 40 mL/min — ABNORMAL LOW (ref >60)
Glucose, Bld: 176 mg/dL — ABNORMAL HIGH (ref 70–99)
Potassium: 3.7 mmol/L (ref 3.5–5.1)
Sodium: 133 mmol/L — ABNORMAL LOW (ref 135–145)

## 2020-06-28 LAB — GLUCOSE, CAPILLARY
Glucose-Capillary: 155 mg/dL — ABNORMAL HIGH (ref 70–99)
Glucose-Capillary: 165 mg/dL — ABNORMAL HIGH (ref 70–99)
Glucose-Capillary: 109 mg/dL — ABNORMAL HIGH (ref 70–99)
Glucose-Capillary: 263 mg/dL — ABNORMAL HIGH (ref 70–99)

## 2020-06-28 LAB — PROCALCITONIN: Procalcitonin: <0.1 ng/mL

## 2020-06-28 LAB — CBC
HCT: 31.4 % — ABNORMAL LOW (ref 39.0–52.0)
Hemoglobin: 10 g/dL — ABNORMAL LOW (ref 13.0–17.0)
MCH: 30.5 pg (ref 26.0–34.0)
MCHC: 31.8 g/dL (ref 30.0–36.0)
MCV: 95.7 fL (ref 80.0–100.0)
Platelets: 216 10*3/uL (ref 150–400)
RBC: 3.28 MIL/uL — ABNORMAL LOW (ref 4.22–5.81)
RDW: 13.6 % (ref 11.5–15.5)
WBC: 8.4 10*3/uL (ref 4.0–10.5)
nRBC: 0.2 % (ref 0.0–0.2)

## 2020-06-28 LAB — HEMOGLOBIN A1C
Hgb A1c MFr Bld: 8 % — ABNORMAL HIGH (ref 4.8–5.6)
Mean Plasma Glucose: 183 mg/dL

## 2020-06-28 LAB — URINE CULTURE: Culture: NO GROWTH

## 2020-06-28 MED ORDER — ENOXAPARIN SODIUM 30 MG/0.3ML ~~LOC~~ SOLN
30.0000 mg | SUBCUTANEOUS | Status: DC
  Filled 2020-06-28: qty 0.3

## 2020-06-28 NOTE — Progress Notes (Signed)
PROGRESS NOTE  Franklin Gonzalez:[id number] DOB: Apr 03, 1922 DOA: 06/26/2020 PCP: Franklin Gonzalez, [name], MD  Brief History:  86 year old male with a history of ischemic cardiomyopathy with EF 45-50%, SVT, coronary artery disease with history of MI, hypertension, diabetes mellitus type 2 presenting with approximately 2-week history of shortness of breath.  On 06/13/2020 the patient had a mechanical fall resulting in a fracture of the right first metatarsal of the right foot.  [facility].  X-rays at that time showed a possible nondisplaced intra-articular fracture of the first metatarsal.  The patient was placed in a surgical shoe and sent home.  He has been using a walker and a cane since then, and he is still been able to bear weight.  However, the patient developed low-grade temperatures with coughing and shortness of breath approximately 1 to 2 days after this ED visit.  He had some generalized weakness at that time.  He went to his PCP.  The patient was placed on cephalexin for presumptive UTI.  Unfortunately, he developed some loose stools and had some nausea and vomiting.  The nausea, vomiting, and diarrhea ultimately resolved, but the patient continued to have shortness of breath and generalized weakness.  The son also noted increasing lower extremity edema and increasing abdominal girth.  The patient denied any frank fevers, chills, chest pain, headache, hemoptysis.  He does complain of some orthopnea type symptoms. Notably, the patient eats carry out most days of the week. In the emergency department, the patient was afebrile hemodynamically stable with oxygen saturation 95% on room air.  BMP showed a sodium 131 with serum creatinine 1.56.  WBC was 8.8 with hemoglobin 11.3 and platelets 285,000.  AST 40, ALT 44, phosphatase 90, total bilirubin 0.7.  Troponin was 648>> 571.  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 -Cardiology consult appreciated -CKD limits ACEi/ARB,ARNI -06/27/20 echo--EF 25%m global HK, grade DD -remains clinically fluid overloaded -Discontinue amlodipine  Acute respiratory failure with hypoxia -Currently stable on 2 L nasal cannula -Secondary to pulmonary edema -Wean oxygen as tolerated -PCT <0.10 -Personally reviewed chest x-ray; bilateral pleural effusion, increased interstitial markings, vascular congestion  CKD stage IIIb -Baseline creatinine 1.1-1.4 -will need to tolerate slightly higher serum creatinine for improved euvolemia  SVT -Currently in sinus rhythm -Personally reviewed EKG--sinus rhythm,RBBB  Elevated troponin/coronary artery disease -Secondary to fluid overload -No chest pain  -Echocardiogram--EF 25%, global HK -Continue aspirin and statin  Essential hypertension -Holding amlodipine and Cardura to allow for blood pressure margin for diuresis  Uncontrolled Diabetes mellitus type 2 with hyperglycemia -Holding Metformin -Hemoglobin A1c--8.0  Right foot fracture -PT evaluation  Hyponatremia -Secondary to CHF -Continue diuresis  Hyperlipidemia -continue statin   Status is: Inpatient  Remains inpatient appropriate because:IV treatments appropriate due to intensity of illness or inability to take PO   Dispo: The patient is from: Home  Anticipated d/c is to: Home  Anticipated d/c date is: 2 days  Patient currently is not medically stable to d/c.        Family Communication:   Son updated 10/23  Consultants:  cardiology  Code Status:  FULL  DVT Prophylaxis:   Lovenox   Procedures: As Listed in Progress Note Above  Antibiotics: Ceftriaxone 10/21 Doxy  10/21     Subjective: Patient is  breathing better, but remains sob with minimal exertion.  Denies f/c, cp, n/v/d, abd pain  Objective: Vitals:   06/27/20 2109 06/28/20 0500 06/28/20 0526 06/28/20 0931  BP: 122/87  111/71   Pulse: 81  82 81  Resp: 20  20   Temp: 98.7 F (37.1 C)  98.5 F (36.9 C)   TempSrc: Oral  Oral   SpO2: 97%  96% 97%  Weight:  75.2 kg    Height:        Intake/Output Summary (Last 24 hours) at 06/28/2020 1348 Last data filed at 06/28/2020 1340 Gross per 24 hour  Intake 720 ml  Output 2600 ml  Net -1880 ml   Weight change: 6.592 kg Exam:   General:  Pt is alert, follows commands appropriately, not in acute distress  HEENT: No icterus, No thrush, No neck mass, [place]/AT  Cardiovascular: RRR, S1/S2, no rubs, no gallops+JVD  Respiratory:bibasilar crackles. No wheeze  Abdomen: Soft/+BS, non tender, non distended, no guarding  Extremities: 2+LE edema, No lymphangitis, No petechiae, No rashes, no synovitis   Data Reviewed: I have personally reviewed following labs and imaging studies Basic Metabolic Panel: Recent Labs  Lab 06/26/20 1739 06/26/20 1945 06/27/20 0352 06/28/20 0608  NA 131*  --  133* 133*  K 5.2*  --  4.7 3.7  CL 99  --  102 98  CO2 21*  --  22 24  GLUCOSE 226*  --  223* 176*  BUN 53*  --  53* 51*  CREATININE 1.56*  --  1.43* 1.58*  CALCIUM 8.9  --  8.2* 8.1*  MG  --  3.0*  --   --    Liver Function Tests: Recent Labs  Lab 06/26/20 1739  AST 40  ALT 44  ALKPHOS 90  BILITOT 0.7  PROT 6.7  ALBUMIN 3.7   No results for input(s): LIPASE, AMYLASE in the last 168 hours. No results for input(s): AMMONIA in the last 168 hours. Coagulation Profile: No results for input(s): INR, PROTIME in the last 168 hours. CBC: Recent Labs  Lab 06/26/20 1739 06/28/20 0608  WBC 8.8 8.4  NEUTROABS 7.1  --   HGB 11.3* 10.0*  HCT 35.2* 31.4*  MCV 94.4 95.7  PLT 285 216   Cardiac Enzymes: No results for input(s): CKTOTAL, CKMB, CKMBINDEX, TROPONINI in the last 168  hours. BNP: Invalid input(s): POCBNP CBG: Recent Labs  Lab 06/27/20 1145 06/27/20 1636 06/27/20 2254 06/28/20 0740 06/28/20 1148  GLUCAP 212* 137* 168* 155* 165*   HbA1C: Recent Labs    06/26/20 1739  HGBA1C 8.0*   Urine analysis:    Component Value Date/Time   COLORURINE YELLOW 06/27/2020 0753   APPEARANCEUR CLEAR 06/27/2020 0753   LABSPEC 1.020 06/27/2020 0753   PHURINE 5.0 06/27/2020 0753   GLUCOSEU NEGATIVE 06/27/2020 0753   HGBUR NEGATIVE 06/27/2020 0753   BILIRUBINUR NEGATIVE 06/27/2020 0753   KETONESUR NEGATIVE 06/27/2020 0753   PROTEINUR NEGATIVE 06/27/2020 0753   UROBILINOGEN 1.0 10/03/2012 1151   NITRITE NEGATIVE 06/27/2020 0753   LEUKOCYTESUR NEGATIVE 06/27/2020 0753   Sepsis Labs: (procalcitonin:4,lacticidven:4) ) Recent Results (from the past 240 hour(s))  Respiratory Panel by RT PCR (Flu A&B, Covid) - Nasopharyngeal Swab     Status: None   Collection Time: 06/26/20  7:06 PM   Specimen: Nasopharyngeal Swab  Result Value Ref Range Status   SARS Coronavirus 2 by RT PCR NEGATIVE NEGATIVE Final    Comment: (NOTE) SARS-CoV-2  target nucleic acids are NOT DETECTED.  The SARS-CoV-2 RNA is generally detectable in upper respiratoy specimens during the acute phase of infection. The lowest concentration of SARS-CoV-2 viral copies this assay can detect is 131 copies/mL. A negative result does not preclude SARS-Cov-2 infection and should not be used as the sole basis for treatment or other patient management decisions. A negative result may occur with  improper specimen collection/handling, submission of specimen other than nasopharyngeal swab, presence of viral mutation(s) within the areas targeted by this assay, and inadequate number of viral copies (<131 copies/mL). A negative result must be combined with clinical observations, patient history, and epidemiological information. The expected result is Negative.  Fact Sheet for Patients:    https://www.moore.com/  Fact Sheet for Healthcare Providers:  https://www.young.biz/  This test is no t yet approved or cleared by the [place] FDA and  has been authorized for detection and/or diagnosis of SARS-CoV-2 by FDA under an Emergency Use Authorization (EUA). This EUA will remain  in effect (meaning this test can be used) for the duration of the COVID-19 declaration under Section 564(b)(1) of the Act, 21 U.S.C. section 360bbb-3(b)(1), unless the authorization is terminated or revoked sooner.     Influenza A by PCR NEGATIVE NEGATIVE Final   Influenza B by PCR NEGATIVE NEGATIVE Final    Comment: (NOTE) The Xpert Xpress SARS-CoV-2/FLU/RSV assay is intended as an aid in  the diagnosis of influenza from Nasopharyngeal swab specimens and  should not be used as a sole basis for treatment. Nasal washings and  aspirates are unacceptable for Xpert Xpress SARS-CoV-2/FLU/RSV  testing.  Fact Sheet for Patients: https://www.moore.com/  Fact Sheet for Healthcare Providers: https://www.young.biz/  This test is not yet approved or cleared by the [place] FDA and  has been authorized for detection and/or diagnosis of SARS-CoV-2 by  FDA under an Emergency Use Authorization (EUA). This EUA will remain  in effect (meaning this test can be used) for the duration of the  Covid-19 declaration under Section 564(b)(1) of the Act, 21  U.S.C. section 360bbb-3(b)(1), unless the authorization is  terminated or revoked. Performed at [facility], [address] [place], [place] [address]   Culture, Urine     Status: None   Collection Time: 06/27/20  7:53 AM   Specimen: Urine, Clean Catch  Result Value Ref Range Status   Specimen Description   Final    URINE, CLEAN CATCH Performed at [facility], [address] [place], [place] [address]    Special Requests   Final    NONE Performed at [facility], [address], [place], [place] [address]    Culture   Final    NO GROWTH Performed at [facility], [address], [place], [place] [address]    Report Status 06/28/2020 FINAL  Final     Scheduled Meds: . amiodarone  100 mg Oral Daily  . aspirin EC  81 mg Oral Daily  . atorvastatin  10 mg Oral QHS  . [START ON 06/29/2020] enoxaparin (LOVENOX) injection  30 mg Subcutaneous Q24H  . furosemide  60 mg Intravenous Q12H  . insulin aspart  0-5 Units Subcutaneous QHS  . insulin aspart  0-9 Units Subcutaneous TID WC   Continuous Infusions:  Procedures/Studies: DG Chest Port 1 View  Result Date: 06/26/2020 CLINICAL DATA:  Cough EXAM: PORTABLE CHEST 1 VIEW COMPARISON:  06/26/2020, 10/26/2018 FINDINGS: Post sternotomy changes. Small left greater than right pleural effusion with dense consolidation left lung base.  Enlarged cardiomediastinal silhouette with mild diffuse interstitial opacity suspicious for pulmonary edema. Aortic atherosclerosis. No pneumothorax. IMPRESSION: Small left greater than right pleural effusions with dense consolidation at the left lung base, atelectasis versus pneumonia. Cardiomegaly with mild diffuse interstitial opacity suspect for mild pulmonary edema. Electronically Signed   By: Jasmine Pang M.D.   On: 06/26/2020 18:03   ECHOCARDIOGRAM COMPLETE  Result Date: 06/27/2020    ECHOCARDIOGRAM REPORT   Patient Name:   Franklin Gonzalez Date of Exam: 06/27/2020 Medical Rec #:  [id number]       Height:       71.0 in Accession #:    [id number]      Weight:       171.1 lb Date of Birth:  1922/02/29       BSA:          1.973 m Patient Age:    86 years        BP:           114/75 mmHg Patient Gender: M               HR:           85 bpm. [facility] Procedure: 2D Echo Indications:    Congestive Heart Failure 428.0 / I50.9  History:        Patient has prior history of Echocardiogram examinations, most                 recent 10/03/2012. CHF, Signs/Symptoms:Fever;  Risk                 Factors:Hypertension and Non-Smoker. SVT, Acute myocardial                 infarction.  Sonographer:    Jeryl Columbia RDCS (AE) Referring Phys: [id number] [address]am [facility] IMPRESSIONS  1. Left ventricular ejection fraction, by estimation, is 25%. The left ventricle has severely decreased function. The left ventricle demonstrates global hypokinesis. The left ventricular internal cavity size was mildly dilated. There is mild left ventricular hypertrophy. Left ventricular diastolic parameters are consistent with Grade II diastolic dysfunction (pseudonormalization). Elevated left atrial pressure.  2. Right ventricular systolic function is mildly reduced. The right ventricular size is normal.  3. Left atrial size was severely dilated.  4. Right atrial size was mildly dilated.  5. The mitral valve is normal in structure. Mild mitral valve regurgitation. No evidence of mitral stenosis.  6. The aortic valve is tricuspid. There is moderate calcification of the aortic valve. There is moderate thickening of the aortic valve. Aortic valve regurgitation is not visualized. No aortic stenosis is present.  7. The inferior vena cava is dilated in size with >50% respiratory variability, suggesting right atrial pressure of 8 mmHg. FINDINGS  Left Ventricle: Left ventricular ejection fraction, by estimation, is 25%. The left ventricle has severely decreased function. The left ventricle demonstrates global hypokinesis. The left ventricular internal cavity size was mildly dilated. There is mild left ventricular hypertrophy. Left ventricular diastolic parameters are consistent with Grade II diastolic dysfunction (pseudonormalization). Elevated left atrial pressure. Right Ventricle: The right ventricular size is normal. Right vetricular wall thickness was not assessed. Right ventricular systolic function is mildly reduced. Left Atrium: Left atrial size was severely dilated. Right Atrium: Right atrial size was mildly  dilated. Pericardium: There is no evidence of pericardial effusion. Mitral Valve: The mitral valve is normal in structure. Mild mitral valve regurgitation. No evidence of mitral valve stenosis. Tricuspid Valve: The  tricuspid valve is normal in structure. Tricuspid valve regurgitation is trivial. No evidence of tricuspid stenosis. Aortic Valve: The aortic valve is tricuspid. There is moderate calcification of the aortic valve. There is moderate thickening of the aortic valve. There is moderate aortic valve annular calcification. Aortic valve regurgitation is not visualized. No aortic stenosis is present. Aortic valve mean gradient measures 2.3 mmHg. Aortic valve peak gradient measures 3.9 mmHg. Aortic valve area, by VTI measures 2.00 cm. Pulmonic Valve: The pulmonic valve was not well visualized. Pulmonic valve regurgitation is not visualized. No evidence of pulmonic stenosis. Aorta: The aortic root is normal in size and structure. Pulmonary Artery: Indeterminant PASP, inadequate TR jet. Venous: The inferior vena cava is dilated in size with greater than 50% respiratory variability, suggesting right atrial pressure of 8 mmHg. IAS/Shunts: No atrial level shunt detected by color flow Doppler.  LEFT VENTRICLE PLAX 2D LVIDd:         5.94 cm  Diastology LVIDs:         5.25 cm  LV e' medial:    3.15 cm/s LV PW:         1.23 cm  LV E/e' medial:  25.6 LV IVS:        1.25 cm  LV e' lateral:   5.93 cm/s LVOT diam:     2.10 cm  LV E/e' lateral: 13.6 LV SV:         34 LV SV Index:   17 LVOT Area:     3.46 cm  RIGHT VENTRICLE RV S prime:     7.13 cm/s TAPSE (M-mode): 1.6 cm LEFT ATRIUM             Index       RIGHT ATRIUM           Index LA diam:        4.40 cm 2.23 cm/m  RA Area:     19.80 cm LA Vol (A2C):   96.2 ml 48.76 ml/m RA Volume:   62.60 ml  31.73 ml/m LA Vol (A4C):   88.2 ml 44.70 ml/m LA Biplane Vol: 97.8 ml 49.57 ml/m  AORTIC VALVE AV Area (Vmax):    2.64 cm AV Area (Vmean):   2.13 cm AV Area (VTI):     2.00  cm AV Vmax:           98.41 cm/s AV Vmean:          73.679 cm/s AV VTI:            0.171 m AV Peak Grad:      3.9 mmHg AV Mean Grad:      2.3 mmHg LVOT Vmax:         74.98 cm/s LVOT Vmean:        45.358 cm/s LVOT VTI:          0.099 m LVOT/AV VTI ratio: 0.58  AORTA Ao Root diam: 3.40 cm MITRAL VALVE MV Area (PHT): 3.97 cm    SHUNTS MV Decel Time: 191 msec    Systemic VTI:  0.10 m MV E velocity: 80.60 cm/s  Systemic Diam: 2.10 cm MV A velocity: 31.70 cm/s MV E/A ratio:  2.54 Dina Rich MD Electronically signed by Dina Rich MD Signature Date/Time: 06/27/2020/4:46:50 PM    Final     Catarina Hartshorn, DO  [facility]  If 7PM-7AM, please contact night-coverage [email or web address] Password [email or web address] 06/28/2020, 1:48 PM   LOS: 2 days

## 2020-06-29 DIAGNOSIS — N1831 Chronic kidney disease, stage 3a: Secondary | ICD-10-CM

## 2020-06-29 LAB — BASIC METABOLIC PANEL
Anion gap: 12 (ref 5–15)
BUN: 42 mg/dL — ABNORMAL HIGH (ref 8–23)
CO2: 25 mmol/L (ref 22–32)
Calcium: 7.9 mg/dL — ABNORMAL LOW (ref 8.9–10.3)
Chloride: 97 mmol/L — ABNORMAL LOW (ref 98–111)
Creatinine, Ser: 1.36 mg/dL — ABNORMAL HIGH (ref 0.61–1.24)
GFR, Estimated: 48 mL/min — ABNORMAL LOW (ref >60)
Glucose, Bld: 153 mg/dL — ABNORMAL HIGH (ref 70–99)
Potassium: 3.1 mmol/L — ABNORMAL LOW (ref 3.5–5.1)
Sodium: 134 mmol/L — ABNORMAL LOW (ref 135–145)

## 2020-06-29 LAB — GLUCOSE, CAPILLARY
Glucose-Capillary: 161 mg/dL — ABNORMAL HIGH (ref 70–99)
Glucose-Capillary: 202 mg/dL — ABNORMAL HIGH (ref 70–99)
Glucose-Capillary: 134 mg/dL — ABNORMAL HIGH (ref 70–99)
Glucose-Capillary: 179 mg/dL — ABNORMAL HIGH (ref 70–99)

## 2020-06-29 LAB — MAGNESIUM: Magnesium: 2.2 mg/dL (ref 1.7–2.4)

## 2020-06-29 MED ORDER — POTASSIUM CHLORIDE CRYS ER 20 MEQ PO TBCR
40.0000 meq | EXTENDED_RELEASE_TABLET | Freq: Every day | ORAL | Status: DC
  Administered 2020-06-29: 40 meq via ORAL
  Filled 2020-06-29: qty 2

## 2020-06-29 MED ORDER — ENOXAPARIN SODIUM 40 MG/0.4ML ~~LOC~~ SOLN
40.0000 mg | SUBCUTANEOUS | Status: DC
  Administered 2020-06-29 – 2020-07-02 (×4): 40 mg via SUBCUTANEOUS
  Filled 2020-06-29 (×4): qty 0.4

## 2020-06-29 NOTE — Plan of Care (Signed)
  Problem: Clinical Measurements: Goal: Respiratory complications will improve Outcome: Progressing   Problem: Nutrition: Goal: Adequate nutrition will be maintained Outcome: Progressing   Problem: Education: Goal: Knowledge of General Education information will improve Description: Including pain rating scale, medication(s)/side effects and non-pharmacologic comfort measures Outcome: Progressing

## 2020-06-29 NOTE — Plan of Care (Signed)

## 2020-06-29 NOTE — Progress Notes (Signed)
PROGRESS NOTE  Franklin Gonzalez VFI:[id number] DOB: September 13, 1921 DOA: 06/26/2020 PCP: Selinda Flavin, MD  Brief History: 86 year old male with a history of ischemic cardiomyopathy with EF 45-50%, SVT, coronary artery disease with history of MI, hypertension, diabetes mellitus type 2 presenting with approximately 2-week history of shortness of breath. On 06/13/2020 the patient had a mechanical fall resulting in a fracture of the right first metatarsal of the right foot. He went to the ED at [facility]. X-rays at that time showed a possible nondisplaced intra-articular fracture of the first metatarsal. The patient was placed in a surgical shoe and sent home. He has been using a walker and a cane since then, and he is still been able to bear weight. However, the patient developed low-grade temperatures with coughing and shortness of breath approximately 1 to 2 days after this ED visit. He had some generalized weakness at that time. He went to his PCP. The patient was placed on cephalexin for presumptive UTI. Unfortunately, he developed some loose stools and had some nausea and vomiting. The nausea, vomiting, and diarrhea ultimately resolved, but the patient continued to have shortness of breath and generalized weakness. The son also noted increasing lower extremity edema and increasing abdominal girth. The patient denied any frank fevers, chills, chest pain, headache, hemoptysis. He does complain of some orthopnea type symptoms. Notably, the patient eats carry out most days of the week. In the emergency department, the patient was afebrile hemodynamically stable with oxygen saturation 95% on room air. BMP showed a sodium 131 with serum creatinine 1.56. WBC was 8.8 with hemoglobin 11.3 and platelets 285,000. AST 40, ALT 44, phosphatase 90, total bilirubin 0.7. Troponin was 648>>571. 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 -Cardiology consult appreciated -CKD limits ACEi/ARB,ARNI -06/27/20 echo--EF 25%m global HK, grade DD -remains clinically fluid overloaded -Discontinue amlodipine  Acute respiratory failure with hypoxia -Currently stable on 2 L nasal cannula>>weaned to RA -Secondary to pulmonary edema -Wean oxygen as tolerated -PCT<0.10 -Personally reviewed chest x-ray;bilateral pleural effusion, increased interstitial markings, vascular congestion  CKD stage IIIb -Baseline creatinine 1.1-1.4 -will need to tolerate slightly higher serum creatinine for improved euvolemia -renal function remains stable on diuretics  SVT -Currently in sinus rhythm -Personally reviewed EKG--sinus rhythm,RBBB  Elevated troponin/coronary artery disease -Secondary to fluid overload -No chest pain -Echocardiogram--EF 25%, global HK -Continue aspirinand statin  Essential hypertension -Holding amlodipine and Cardura to allow for blood pressure margin for diuresis  Uncontrolled Diabetes mellitus type 2 with hyperglycemia -Holding Metformin -Hemoglobin A1c--8.0  Right foot fracture -PT evaluation  Hyponatremia -Secondary to CHF -Continue diuresis  Hyperlipidemia -continue statin   Status is: Inpatient  Remains inpatient appropriate because:IV treatments appropriate due to intensity of illness or inability to take PO -remains clinically fluid overloaded   Dispo: The patient is from:Home Anticipated d/c is CZ:YSAY Anticipated d/c date is: 2 days Patient currently is not medically stable to d/c.        Family Communication:Son updated 10/24  Consultants:cardiology  Code Status: FULL  DVT Prophylaxis: [place] Lovenox   Procedures: As Listed in  Progress Note Above  Antibiotics: Ceftriaxone 10/21 Doxy 10/21      Subjective: Patient states he is breathing better but remains sob with exertion.  Denies f/c, cp, n/v/d, abd  pain, dysuria  Objective: Vitals:   06/28/20 1405 06/28/20 1716 06/28/20 2123 06/29/20 0445  BP: 116/71  (!) 149/87 130/80  Pulse: 79  87 85  Resp: 18  20 20   Temp: 98.2 F (36.8 C)  99.8 F (37.7 C) 98 F (36.7 C)  TempSrc: Oral  Oral   SpO2: 100% 94% 96% 91%  Weight:      Height:        Intake/Output Summary (Last 24 hours) at 06/29/2020 0925 Last data filed at 06/29/2020 0200 Gross per 24 hour  Intake 840 ml  Output 3300 ml  Net -2460 ml   Weight change:  Exam:   General:  Pt is alert, follows commands appropriately, not in acute distress  HEENT: No icterus, No thrush, No neck mass, [place]/AT  Cardiovascular: RRR, S1/S2, no rubs, no gallops  Respiratory: bibasilar crackles. No wheeze  Abdomen: Soft/+BS, non tender, non distended, no guarding  Extremities: 2+LE edema, No lymphangitis, No petechiae, No rashes, no synovitis   Data Reviewed: I have personally reviewed following labs and imaging studies Basic Metabolic Panel: Recent Labs  Lab 06/26/20 1739 06/26/20 1945 06/27/20 0352 06/28/20 0608 06/29/20 0520  NA 131*  --  133* 133* 134*  K 5.2*  --  4.7 3.7 3.1*  CL 99  --  102 98 97*  CO2 21*  --  22 24 25   GLUCOSE 226*  --  223* 176* 153*  BUN 53*  --  53* 51* 42*  CREATININE 1.56*  --  1.43* 1.58* 1.36*  CALCIUM 8.9  --  8.2* 8.1* 7.9*  MG  --  3.0*  --   --  2.2   Liver Function Tests: Recent Labs  Lab 06/26/20 1739  AST 40  ALT 44  ALKPHOS 90  BILITOT 0.7  PROT 6.7  ALBUMIN 3.7   No results for input(s): LIPASE, AMYLASE in the last 168 hours. No results for input(s): AMMONIA in the last 168 hours. Coagulation Profile: No results for input(s): INR, PROTIME in the last 168 hours. CBC: Recent Labs  Lab 06/26/20 1739 06/28/20 0608  WBC 8.8 8.4    NEUTROABS 7.1  --   HGB 11.3* 10.0*  HCT 35.2* 31.4*  MCV 94.4 95.7  PLT 285 216   Cardiac Enzymes: No results for input(s): CKTOTAL, CKMB, CKMBINDEX, TROPONINI in the last 168 hours. BNP: Invalid input(s): POCBNP CBG: Recent Labs  Lab 06/28/20 0740 06/28/20 1148 06/28/20 1625 06/28/20 2107 06/29/20 0812  GLUCAP 155* 165* 109* 263* 161*   HbA1C: Recent Labs    06/26/20 1739  HGBA1C 8.0*   Urine analysis:    Component Value Date/Time   COLORURINE YELLOW 06/27/2020 0753   APPEARANCEUR CLEAR 06/27/2020 0753   LABSPEC 1.020 06/27/2020 0753   PHURINE 5.0 06/27/2020 0753   GLUCOSEU NEGATIVE 06/27/2020 0753   HGBUR NEGATIVE 06/27/2020 0753   BILIRUBINUR NEGATIVE 06/27/2020 0753   KETONESUR NEGATIVE 06/27/2020 0753   PROTEINUR NEGATIVE 06/27/2020 0753   UROBILINOGEN 1.0 10/03/2012 1151   NITRITE NEGATIVE 06/27/2020 0753   LEUKOCYTESUR NEGATIVE 06/27/2020 0753   Sepsis Labs: @LABRCNTIP (procalcitonin:4,lacticidven:4) ) Recent Results (from the past 240 hour(s))  Respiratory Panel by RT PCR (Flu A&B, Covid) - Nasopharyngeal Swab     Status: None   Collection Time: 06/26/20  7:06 PM   Specimen: Nasopharyngeal Swab  Result Value Ref Range Status   SARS Coronavirus 2 by RT PCR NEGATIVE NEGATIVE Final    Comment: (NOTE) SARS-CoV-2 target nucleic acids are NOT DETECTED.  The SARS-CoV-2 RNA is generally detectable in upper respiratoy specimens during the acute phase of infection. The lowest concentration of SARS-CoV-2 viral copies this assay can detect is 131 copies/mL. A negative result does not preclude SARS-Cov-2 infection and should not be used as the sole basis for treatment or other patient management decisions. A negative result may occur with  improper specimen collection/handling, submission of specimen other than nasopharyngeal swab, presence of viral mutation(s) within the areas targeted by this assay, and inadequate number of viral copies (<131 copies/mL).  A negative result must be combined with clinical observations, patient history, and epidemiological information. The expected result is Negative.  Fact Sheet for Patients:  https://www.moore.com/  Fact Sheet for Healthcare Providers:  https://www.young.biz/  This test is no t yet approved or cleared by the [place] FDA and  has been authorized for detection and/or diagnosis of SARS-CoV-2 by FDA under an Emergency Use Authorization (EUA). This EUA will remain  in effect (meaning this test can be used) for the duration of the COVID-19 declaration under Section 564(b)(1) of the Act, 21 U.S.C. section 360bbb-3(b)(1), unless the authorization is terminated or revoked sooner.     Influenza A by PCR NEGATIVE NEGATIVE Final   Influenza B by PCR NEGATIVE NEGATIVE Final    Comment: (NOTE) The Xpert Xpress SARS-CoV-2/FLU/RSV assay is intended as an aid in  the diagnosis of influenza from Nasopharyngeal swab specimens and  should not be used as a sole basis for treatment. Nasal washings and  aspirates are unacceptable for Xpert Xpress SARS-CoV-2/FLU/RSV  testing.  Fact Sheet for Patients: https://www.moore.com/  Fact Sheet for Healthcare Providers: https://www.young.biz/  This test is not yet approved or cleared by the [place] FDA and  has been authorized for detection and/or diagnosis of SARS-CoV-2 by  FDA under an Emergency Use Authorization (EUA). This EUA will remain  in effect (meaning this test can be used) for the duration of the  Covid-19 declaration under Section 564(b)(1) of the Act, 21  U.S.C. section 360bbb-3(b)(1), unless the authorization is  terminated or revoked. Performed at [facility], [address] [place], [place] [address]   Culture, Urine     Status: None   Collection Time: 06/27/20  7:53 AM   Specimen: Urine, Clean Catch  Result Value Ref Range Status   Specimen  Description   Final    URINE, CLEAN CATCH Performed at [facility], [address], [place], [place] [address]    Special Requests   Final    NONE Performed at [facility], [address], [place], [place] [address]    Culture   Final    NO GROWTH Performed at [facility], [address] [place], [place] [address]    Report Status 06/28/2020 FINAL  Final     Scheduled Meds: . amiodarone  100 mg Oral Daily  . aspirin EC  81 mg Oral Daily  . atorvastatin  10 mg Oral QHS  . enoxaparin (LOVENOX) injection  40 mg Subcutaneous Q24H  . furosemide  60 mg Intravenous Q12H  . insulin aspart  0-5 Units Subcutaneous QHS  . insulin aspart  0-9 Units Subcutaneous TID WC   Continuous Infusions:  Procedures/Studies: DG Chest Port 1 View  Result Date: 06/26/2020 CLINICAL DATA:  Cough EXAM: PORTABLE CHEST 1 VIEW COMPARISON:  06/26/2020, 10/26/2018 FINDINGS: Post sternotomy changes. Small left greater than right pleural effusion with dense consolidation left lung base. Enlarged cardiomediastinal silhouette with mild diffuse interstitial opacity suspicious for pulmonary  edema. Aortic atherosclerosis. No pneumothorax. IMPRESSION: Small left greater than right pleural effusions with dense consolidation at the left lung base, atelectasis versus pneumonia. Cardiomegaly with mild diffuse interstitial opacity suspect for mild pulmonary edema. Electronically Signed   By: Jasmine Pang M.D.   On: 06/26/2020 18:03   ECHOCARDIOGRAM COMPLETE  Result Date: 06/27/2020    ECHOCARDIOGRAM REPORT   Patient Name:   Franklin Gonzalez Date of Exam: 06/27/2020 Medical Rec #:  [id number]       Height:       71.0 in Accession #:    [id number]      Weight:       171.1 lb Date of Birth:  June 07, 1922       BSA:          1.973 m Patient Age:    89 years        BP:           114/75 mmHg Patient Gender: M               HR:           85 bpm. [facility] Procedure: 2D Echo Indications:    Congestive Heart Failure  428.0 / I50.9  History:        Patient has prior history of Echocardiogram examinations, most                 recent 10/03/2012. CHF, Signs/Symptoms:Fever; Risk                 Factors:Hypertension and Non-Smoker. SVT, Acute myocardial                 infarction.  Sonographer:    Jeryl Columbia RDCS (AE) Referring Phys: [id number]am [facility] IMPRESSIONS  1. Left ventricular ejection fraction, by estimation, is 25%. The left ventricle has severely decreased function. The left ventricle demonstrates global hypokinesis. The left ventricular internal cavity size was mildly dilated. There is mild left ventricular hypertrophy. Left ventricular diastolic parameters are consistent with Grade II diastolic dysfunction (pseudonormalization). Elevated left atrial pressure.  2. Right ventricular systolic function is mildly reduced. The right ventricular size is normal.  3. Left atrial size was severely dilated.  4. Right atrial size was mildly dilated.  5. The mitral valve is normal in structure. Mild mitral valve regurgitation. No evidence of mitral stenosis.  6. The aortic valve is tricuspid. There is moderate calcification of the aortic valve. There is moderate thickening of the aortic valve. Aortic valve regurgitation is not visualized. No aortic stenosis is present.  7. The inferior vena cava is dilated in size with >50% respiratory variability, suggesting right atrial pressure of 8 mmHg. FINDINGS  Left Ventricle: Left ventricular ejection fraction, by estimation, is 25%. The left ventricle has severely decreased function. The left ventricle demonstrates global hypokinesis. The left ventricular internal cavity size was mildly dilated. There is mild left ventricular hypertrophy. Left ventricular diastolic parameters are consistent with Grade II diastolic dysfunction (pseudonormalization). Elevated left atrial pressure. Right Ventricle: The right ventricular size is normal. Right vetricular wall thickness was not  assessed. Right ventricular systolic function is mildly reduced. Left Atrium: Left atrial size was severely dilated. Right Atrium: Right atrial size was mildly dilated. Pericardium: There is no evidence of pericardial effusion. Mitral Valve: The mitral valve is normal in structure. Mild mitral valve regurgitation. No evidence of mitral valve stenosis. Tricuspid Valve: The tricuspid valve is normal in structure. Tricuspid valve regurgitation is trivial.  No evidence of tricuspid stenosis. Aortic Valve: The aortic valve is tricuspid. There is moderate calcification of the aortic valve. There is moderate thickening of the aortic valve. There is moderate aortic valve annular calcification. Aortic valve regurgitation is not visualized. No aortic stenosis is present. Aortic valve mean gradient measures 2.3 mmHg. Aortic valve peak gradient measures 3.9 mmHg. Aortic valve area, by VTI measures 2.00 cm. Pulmonic Valve: The pulmonic valve was not well visualized. Pulmonic valve regurgitation is not visualized. No evidence of pulmonic stenosis. Aorta: The aortic root is normal in size and structure. Pulmonary Artery: Indeterminant PASP, inadequate TR jet. Venous: The inferior vena cava is dilated in size with greater than 50% respiratory variability, suggesting right atrial pressure of 8 mmHg. IAS/Shunts: No atrial level shunt detected by color flow Doppler.  LEFT VENTRICLE PLAX 2D LVIDd:         5.94 cm  Diastology LVIDs:         5.25 cm  LV e' medial:    3.15 cm/s LV PW:         1.23 cm  LV E/e' medial:  25.6 LV IVS:        1.25 cm  LV e' lateral:   5.93 cm/s LVOT diam:     2.10 cm  LV E/e' lateral: 13.6 LV SV:         34 LV SV Index:   17 LVOT Area:     3.46 cm  RIGHT VENTRICLE RV S prime:     7.13 cm/s TAPSE (M-mode): 1.6 cm LEFT ATRIUM             Index       RIGHT ATRIUM           Index LA diam:        4.40 cm 2.23 cm/m  RA Area:     19.80 cm LA Vol (A2C):   96.2 ml 48.76 ml/m RA Volume:   62.60 ml  31.73 ml/m LA  Vol (A4C):   88.2 ml 44.70 ml/m LA Biplane Vol: 97.8 ml 49.57 ml/m  AORTIC VALVE AV Area (Vmax):    2.64 cm AV Area (Vmean):   2.13 cm AV Area (VTI):     2.00 cm AV Vmax:           98.41 cm/s AV Vmean:          73.679 cm/s AV VTI:            0.171 m AV Peak Grad:      3.9 mmHg AV Mean Grad:      2.3 mmHg LVOT Vmax:         74.98 cm/s LVOT Vmean:        45.358 cm/s LVOT VTI:          0.099 m LVOT/AV VTI ratio: 0.58  AORTA Ao Root diam: 3.40 cm MITRAL VALVE MV Area (PHT): 3.97 cm    SHUNTS MV Decel Time: 191 msec    Systemic VTI:  0.10 m MV E velocity: 80.60 cm/s  Systemic Diam: 2.10 cm MV A velocity: 31.70 cm/s MV E/A ratio:  2.54 Dina RichJonathan Branch MD Electronically signed by Dina RichJonathan Branch MD Signature Date/Time: 06/27/2020/4:46:50 PM    Final     Catarina Hartshornavid Akon Reinoso, DO  [facility]  If 7PM-7AM, please contact night-coverage [email or web address] Password [email or web address] 06/29/2020, 9:25 AM   LOS: 3 days

## 2020-06-30 ENCOUNTER — Ambulatory Visit: Payer: Medicare Other | Admitting: Cardiology

## 2020-06-30 ENCOUNTER — Telehealth: Payer: Self-pay | Admitting: Orthopedic Surgery

## 2020-06-30 DIAGNOSIS — I5021 Acute systolic (congestive) heart failure: Secondary | ICD-10-CM

## 2020-06-30 LAB — BASIC METABOLIC PANEL
Anion gap: 13 (ref 5–15)
BUN: 36 mg/dL — ABNORMAL HIGH (ref 8–23)
CO2: 26 mmol/L (ref 22–32)
Calcium: 8.1 mg/dL — ABNORMAL LOW (ref 8.9–10.3)
Chloride: 95 mmol/L — ABNORMAL LOW (ref 98–111)
Creatinine, Ser: 1.22 mg/dL (ref 0.61–1.24)
GFR, Estimated: 54 mL/min — ABNORMAL LOW (ref >60)
Glucose, Bld: 176 mg/dL — ABNORMAL HIGH (ref 70–99)
Potassium: 3 mmol/L — ABNORMAL LOW (ref 3.5–5.1)
Sodium: 134 mmol/L — ABNORMAL LOW (ref 135–145)

## 2020-06-30 LAB — GLUCOSE, CAPILLARY
Glucose-Capillary: 163 mg/dL — ABNORMAL HIGH (ref 70–99)
Glucose-Capillary: 195 mg/dL — ABNORMAL HIGH (ref 70–99)
Glucose-Capillary: 159 mg/dL — ABNORMAL HIGH (ref 70–99)
Glucose-Capillary: 194 mg/dL — ABNORMAL HIGH (ref 70–99)

## 2020-06-30 MED ORDER — SENNA 8.6 MG PO TABS
1.0000 | ORAL_TABLET | Freq: Every day | ORAL | Status: DC
  Administered 2020-06-30 – 2020-07-02 (×3): 8.6 mg via ORAL
  Filled 2020-06-30 (×3): qty 1

## 2020-06-30 MED ORDER — POLYETHYLENE GLYCOL 3350 17 G PO PACK
17.0000 g | PACK | Freq: Every day | ORAL | Status: DC
  Administered 2020-06-30 – 2020-07-02 (×3): 17 g via ORAL
  Filled 2020-06-30 (×3): qty 1

## 2020-06-30 MED ORDER — POTASSIUM CHLORIDE CRYS ER 20 MEQ PO TBCR
40.0000 meq | EXTENDED_RELEASE_TABLET | Freq: Two times a day (BID) | ORAL | Status: DC
  Administered 2020-06-30 – 2020-07-02 (×5): 40 meq via ORAL
  Filled 2020-06-30 (×5): qty 2

## 2020-06-30 MED ORDER — SPIRONOLACTONE 25 MG PO TABS
12.5000 mg | ORAL_TABLET | Freq: Every day | ORAL | Status: DC
  Administered 2020-06-30 – 2020-07-02 (×3): 12.5 mg via ORAL
  Filled 2020-06-30 (×3): qty 0.5
  Filled 2020-06-30: qty 1
  Filled 2020-06-30: qty 0.5
  Filled 2020-06-30 (×2): qty 1
  Filled 2020-06-30: qty 0.5

## 2020-06-30 NOTE — Progress Notes (Signed)
Patient ID: Franklin Gonzalez, male   DOB: 1922/03/18, 86 y.o.   MRN: [id number]  Orthopedic note patient has a first metatarsal fracture seen in the ER about 2 weeks ago with a nondisplaced fracture and can be weightbearing as tolerated with hard soled shoe or boot  Follow-up with orthopedics in 4 weeks for x-ray

## 2020-06-30 NOTE — Progress Notes (Signed)
PROGRESS NOTE  Franklin Gonzalez ZOX:[id number] DOB: September 03, 1922 DOA: 06/26/2020 PCP: Selinda Flavin, MD    Brief History: 86 year old male with a history of ischemic cardiomyopathy with EF 45-50%, SVT, coronary artery disease with history of MI, hypertension, diabetes mellitus type 2 presenting with approximately 2-week history of shortness of breath. On 06/13/2020 the patient had a mechanical fall resulting in a fracture of the right first metatarsal of the right foot. He went to the ED at [facility]. X-rays at that time showed a possible nondisplaced intra-articular fracture of the first metatarsal. The patient was placed in a surgical shoe and sent home. He has been using a walker and a cane since then, and he is still been able to bear weight. However, the patient developed low-grade temperatures with coughing and shortness of breath approximately 1 to 2 days after this ED visit. He had some generalized weakness at that time. He went to his PCP. The patient was placed on cephalexin for presumptive UTI. Unfortunately, he developed some loose stools and had some nausea and vomiting. The nausea, vomiting, and diarrhea ultimately resolved, but the patient continued to have shortness of breath and generalized weakness. The son also noted increasing lower extremity edema and increasing abdominal girth. The patient denied any frank fevers, chills, chest pain, headache, hemoptysis. He does complain of some orthopnea type symptoms. Notably, the patient eats carry out most days of the week. In the emergency department, the patient was afebrile hemodynamically stable with oxygen saturation 95% on room air. BMP showed a sodium 131 with serum creatinine 1.56. WBC was 8.8 with hemoglobin 11.3 and platelets 285,000. AST 40, ALT 44, phosphatase 90, total bilirubin 0.7. Troponin was 648>>571. 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 -Cardiology consultappreciated -CKD limits ACEi/ARB,ARNI -10/22/21echo--EF 25%m global HK, grade DD -remains clinically fluid overloaded -Discontinue amlodipine  Acute respiratory failure with hypoxia -Currently stable on 2 L nasal cannula>>weaned to RA -Secondary to pulmonary edema -Wean oxygen as tolerated -PCT<0.10 -Personally reviewed chest x-ray;bilateral pleural effusion, increased interstitial markings, vascular congestion  CKD stage IIIb -Baseline creatinine 1.1-1.4 -will need to tolerate slightly higher serum creatinine for improved euvolemia -renal function remains stable on diuretics  SVT -Currently in sinus rhythm -Personally reviewed EKG--sinus rhythm,RBBB  Elevated troponin/coronary artery disease -Secondary to fluid overload -No chest pain -Echocardiogram--EF 25%, global HK -Continue aspirinand statin  Essential hypertension -Holding amlodipine and Cardura to allow for blood pressure margin for diuresis -BP well controlled  UncontrolledDiabetes mellitus type 2with hyperglycemia -Holding Metformin -Hemoglobin A1c--8.0  Right foot fracture -PT evaluation  Hyponatremia -Secondary to CHF -Continue diuresis  Hyperlipidemia -continue statin   Status is: Inpatient  Remains inpatient appropriate because:IV treatments appropriate due to intensity of illness or inability to take PO -remains clinically fluid overloaded   Dispo: The patient is from:Home Anticipated d/c is WJ:XBJY Anticipated d/c date is: 2 days Patient currently is not medically stable to d/c.        Family Communication:Son updated 10/24  Consultants:cardiology  Code Status: FULL  DVT Prophylaxis: Golf  Lovenox   Procedures: As Listed in Progress Note Above  Antibiotics: Ceftriaxone 10/21 Doxy 10/21     Subjective: Patient states he is breathing better.  Patient denies fevers, chills, headache, chest pain, dyspnea, nausea, vomiting, diarrhea, abdominal  pain, dysuria, hematuria, hematochezia, and melena.   Objective: Vitals:   06/29/20 0445 06/29/20 1338 06/29/20 2044 06/30/20 0539  BP: 130/80 117/69 (!) 139/91 129/81  Pulse: 85 81 84 79  Resp: 20 18 20 20   Temp: 98 F (36.7 C) 98.3 F (36.8 C) 98.9 F (37.2 C) 98.4 F (36.9 C)  TempSrc:   Oral Oral  SpO2: 91% 97% 94% 93%  Weight:    70.6 kg  Height:        Intake/Output Summary (Last 24 hours) at 06/30/2020 0959 Last data filed at 06/30/2020 0500 Gross per 24 hour  Intake 720 ml  Output 3450 ml  Net -2730 ml   Weight change:  Exam:   General:  Pt is alert, follows commands appropriately, not in acute distress  HEENT: No icterus, No thrush, No neck mass, [place]/AT  Cardiovascular: RRR, S1/S2, no rubs, no gallops  Respiratory: bibasilar crackles.  No wheeze  Abdomen: Soft/+BS, non tender, non distended, no guarding  Extremities: 1+LE edema, No lymphangitis, No petechiae, No rashes, no synovitis   Data Reviewed: I have personally reviewed following labs and imaging studies Basic Metabolic Panel: Recent Labs  Lab 06/26/20 1739 06/26/20 1945 06/27/20 0352 06/28/20 0608 06/29/20 0520 06/30/20 0529  NA 131*  --  133* 133* 134* 134*  K 5.2*  --  4.7 3.7 3.1* 3.0*  CL 99  --  102 98 97* 95*  CO2 21*  --  22 24 25 26   GLUCOSE 226*  --  223* 176* 153* 176*  BUN 53*  --  53* 51* 42* 36*  CREATININE 1.56*  --  1.43* 1.58* 1.36* 1.22  CALCIUM 8.9  --  8.2* 8.1* 7.9* 8.1*  MG  --  3.0*  --   --  2.2  --    Liver Function Tests: Recent Labs  Lab 06/26/20 1739  AST 40  ALT 44  ALKPHOS 90  BILITOT 0.7  PROT 6.7  ALBUMIN 3.7   No results for input(s): LIPASE, AMYLASE in the last 168 hours. No  results for input(s): AMMONIA in the last 168 hours. Coagulation Profile: No results for input(s): INR, PROTIME in the last 168 hours. CBC: Recent Labs  Lab 06/26/20 1739 06/28/20 0608  WBC 8.8 8.4  NEUTROABS 7.1  --   HGB 11.3* 10.0*  HCT 35.2* 31.4*  MCV 94.4 95.7  PLT 285 216   Cardiac Enzymes: No results for input(s): CKTOTAL, CKMB, CKMBINDEX, TROPONINI in the last 168 hours. BNP: Invalid input(s): POCBNP CBG: Recent Labs  Lab 06/29/20 0812 06/29/20 1114 06/29/20 1613 06/29/20 2043 06/30/20 0800  GLUCAP 161* 202* 134* 179* 163*   HbA1C: No results for input(s): HGBA1C in the last 72 hours. Urine analysis:    Component Value Date/Time   COLORURINE YELLOW 06/27/2020 0753   APPEARANCEUR CLEAR 06/27/2020 0753   LABSPEC 1.020 06/27/2020 0753   PHURINE 5.0 06/27/2020 0753   GLUCOSEU NEGATIVE 06/27/2020 0753   HGBUR NEGATIVE 06/27/2020 0753   BILIRUBINUR NEGATIVE 06/27/2020 0753   KETONESUR NEGATIVE 06/27/2020 0753   PROTEINUR NEGATIVE 06/27/2020 0753   UROBILINOGEN 1.0 10/03/2012 1151   NITRITE NEGATIVE 06/27/2020 0753   LEUKOCYTESUR NEGATIVE 06/27/2020 0753   Sepsis Labs: @LABRCNTIP (procalcitonin:4,lacticidven:4) ) Recent Results (from the past 240 hour(s))  Respiratory Panel by RT PCR (Flu A&B, Covid) - Nasopharyngeal Swab     Status: None   Collection Time: 06/26/20  7:06 PM   Specimen: Nasopharyngeal Swab  Result Value Ref Range Status   SARS Coronavirus  2 by RT PCR NEGATIVE NEGATIVE Final    Comment: (NOTE) SARS-CoV-2 target nucleic acids are NOT DETECTED.  The SARS-CoV-2 RNA is generally detectable in upper respiratoy specimens during the acute phase of infection. The lowest concentration of SARS-CoV-2 viral copies this assay can detect is 131 copies/mL. A negative result does not preclude SARS-Cov-2 infection and should not be used as the sole basis for treatment or other patient management decisions. A negative result may occur with  improper  specimen collection/handling, submission of specimen other than nasopharyngeal swab, presence of viral mutation(s) within the areas targeted by this assay, and inadequate number of viral copies (<131 copies/mL). A negative result must be combined with clinical observations, patient history, and epidemiological information. The expected result is Negative.  Fact Sheet for Patients:  https://www.moore.com/  Fact Sheet for Healthcare Providers:  https://www.young.biz/  This test is no t yet approved or cleared by the [place] FDA and  has been authorized for detection and/or diagnosis of SARS-CoV-2 by FDA under an Emergency Use Authorization (EUA). This EUA will remain  in effect (meaning this test can be used) for the duration of the COVID-19 declaration under Section 564(b)(1) of the Act, 21 U.S.C. section 360bbb-3(b)(1), unless the authorization is terminated or revoked sooner.     Influenza A by PCR NEGATIVE NEGATIVE Final   Influenza B by PCR NEGATIVE NEGATIVE Final    Comment: (NOTE) The Xpert Xpress SARS-CoV-2/FLU/RSV assay is intended as an aid in  the diagnosis of influenza from Nasopharyngeal swab specimens and  should not be used as a sole basis for treatment. Nasal washings and  aspirates are unacceptable for Xpert Xpress SARS-CoV-2/FLU/RSV  testing.  Fact Sheet for Patients: https://www.moore.com/  Fact Sheet for Healthcare Providers: https://www.young.biz/  This test is not yet approved or cleared by the [place] FDA and  has been authorized for detection and/or diagnosis of SARS-CoV-2 by  FDA under an Emergency Use Authorization (EUA). This EUA will remain  in effect (meaning this test can be used) for the duration of the  Covid-19 declaration under Section 564(b)(1) of the Act, 21  U.S.C. section 360bbb-3(b)(1), unless the authorization is  terminated or revoked. Performed at  [facility], [address] [place], [place] [address]   Culture, Urine     Status: None   Collection Time: 06/27/20  7:53 AM   Specimen: Urine, Clean Catch  Result Value Ref Range Status   Specimen Description   Final    URINE, CLEAN CATCH Performed at [facility], [address], [place], [place] [address]    Special Requests   Final    NONE Performed at [facility], [address], [place], [place] [address]    Culture   Final    NO GROWTH Performed at [facility], [address], [place], [place] [address]    Report Status 06/28/2020 FINAL  Final     Scheduled Meds: . amiodarone  100 mg Oral Daily  . aspirin EC  81 mg Oral Daily  . atorvastatin  10 mg Oral QHS  . enoxaparin (LOVENOX) injection  40 mg Subcutaneous Q24H  . furosemide  60 mg Intravenous Q12H  . insulin aspart  0-5 Units Subcutaneous QHS  . insulin aspart  0-9 Units Subcutaneous TID WC  . potassium chloride  40 mEq Oral BID   Continuous Infusions:  Procedures/Studies: DG Chest Port 1 View  Result Date: 06/26/2020 CLINICAL DATA:  Cough EXAM: PORTABLE CHEST 1 VIEW COMPARISON:  06/26/2020,  10/26/2018 FINDINGS: Post sternotomy changes. Small left greater than right pleural effusion with dense consolidation left lung base. Enlarged cardiomediastinal silhouette with mild diffuse interstitial opacity suspicious for pulmonary edema. Aortic atherosclerosis. No pneumothorax. IMPRESSION: Small left greater than right pleural effusions with dense consolidation at the left lung base, atelectasis versus pneumonia. Cardiomegaly with mild diffuse interstitial opacity suspect for mild pulmonary edema. Electronically Signed   By: Jasmine Pang M.D.   On: 06/26/2020 18:03   ECHOCARDIOGRAM COMPLETE  Result Date: 06/27/2020    ECHOCARDIOGRAM REPORT   Patient Name:   Franklin Gonzalez Date of Exam: 06/27/2020 Medical Rec #:  [id number]       Height:       71.0 in Accession #:    [id number]      Weight:       171.1 lb Date of  Birth:  Sep 20, 1921       BSA:          1.973 m Patient Age:    89 years        BP:           114/75 mmHg Patient Gender: M               HR:           85 bpm. [facility]am [facility] Procedure: 2D Echo Indications:    Congestive Heart Failure 428.0 / I50.9  History:        Patient has prior history of Echocardiogram examinations, most                 recent 10/03/2012. CHF, Signs/Symptoms:Fever; Risk                 Factors:Hypertension and Non-Smoker. SVT, Acute myocardial                 infarction.  Sonographer:    Jeryl Columbia RDCS (AE) Referring Phys: [id number] [facility] IMPRESSIONS  1. Left ventricular ejection fraction, by estimation, is 25%. The left ventricle has severely decreased function. The left ventricle demonstrates global hypokinesis. The left ventricular internal cavity size was mildly dilated. There is mild left ventricular hypertrophy. Left ventricular diastolic parameters are consistent with Grade II diastolic dysfunction (pseudonormalization). Elevated left atrial pressure.  2. Right ventricular systolic function is mildly reduced. The right ventricular size is normal.  3. Left atrial size was severely dilated.  4. Right atrial size was mildly dilated.  5. The mitral valve is normal in structure. Mild mitral valve regurgitation. No evidence of mitral stenosis.  6. The aortic valve is tricuspid. There is moderate calcification of the aortic valve. There is moderate thickening of the aortic valve. Aortic valve regurgitation is not visualized. No aortic stenosis is present.  7. The inferior vena cava is dilated in size with >50% respiratory variability, suggesting right atrial pressure of 8 mmHg. FINDINGS  Left Ventricle: Left ventricular ejection fraction, by estimation, is 25%. The left ventricle has severely decreased function. The left ventricle demonstrates global hypokinesis. The left ventricular internal cavity size was mildly dilated. There is mild left ventricular hypertrophy.  Left ventricular diastolic parameters are consistent with Grade II diastolic dysfunction (pseudonormalization). Elevated left atrial pressure. Right Ventricle: The right ventricular size is normal. Right vetricular wall thickness was not assessed. Right ventricular systolic function is mildly reduced. Left Atrium: Left atrial size was severely dilated. Right Atrium: Right atrial size was mildly dilated. Pericardium: There is no evidence of pericardial effusion. Mitral Valve: The mitral  valve is normal in structure. Mild mitral valve regurgitation. No evidence of mitral valve stenosis. Tricuspid Valve: The tricuspid valve is normal in structure. Tricuspid valve regurgitation is trivial. No evidence of tricuspid stenosis. Aortic Valve: The aortic valve is tricuspid. There is moderate calcification of the aortic valve. There is moderate thickening of the aortic valve. There is moderate aortic valve annular calcification. Aortic valve regurgitation is not visualized. No aortic stenosis is present. Aortic valve mean gradient measures 2.3 mmHg. Aortic valve peak gradient measures 3.9 mmHg. Aortic valve area, by VTI measures 2.00 cm. Pulmonic Valve: The pulmonic valve was not well visualized. Pulmonic valve regurgitation is not visualized. No evidence of pulmonic stenosis. Aorta: The aortic root is normal in size and structure. Pulmonary Artery: Indeterminant PASP, inadequate TR jet. Venous: The inferior vena cava is dilated in size with greater than 50% respiratory variability, suggesting right atrial pressure of 8 mmHg. IAS/Shunts: No atrial level shunt detected by color flow Doppler.  LEFT VENTRICLE PLAX 2D LVIDd:         5.94 cm  Diastology LVIDs:         5.25 cm  LV e' medial:    3.15 cm/s LV PW:         1.23 cm  LV E/e' medial:  25.6 LV IVS:        1.25 cm  LV e' lateral:   5.93 cm/s LVOT diam:     2.10 cm  LV E/e' lateral: 13.6 LV SV:         34 LV SV Index:   17 LVOT Area:     3.46 cm  RIGHT VENTRICLE RV S prime:      7.13 cm/s TAPSE (M-mode): 1.6 cm LEFT ATRIUM             Index       RIGHT ATRIUM           Index LA diam:        4.40 cm 2.23 cm/m  RA Area:     19.80 cm LA Vol (A2C):   96.2 ml 48.76 ml/m RA Volume:   62.60 ml  31.73 ml/m LA Vol (A4C):   88.2 ml 44.70 ml/m LA Biplane Vol: 97.8 ml 49.57 ml/m  AORTIC VALVE AV Area (Vmax):    2.64 cm AV Area (Vmean):   2.13 cm AV Area (VTI):     2.00 cm AV Vmax:           98.41 cm/s AV Vmean:          73.679 cm/s AV VTI:            0.171 m AV Peak Grad:      3.9 mmHg AV Mean Grad:      2.3 mmHg LVOT Vmax:         74.98 cm/s LVOT Vmean:        45.358 cm/s LVOT VTI:          0.099 m LVOT/AV VTI ratio: 0.58  AORTA Ao Root diam: 3.40 cm MITRAL VALVE MV Area (PHT): 3.97 cm    SHUNTS MV Decel Time: 191 msec    Systemic VTI:  0.10 m MV E velocity: 80.60 cm/s  Systemic Diam: 2.10 cm MV A velocity: 31.70 cm/s MV E/A ratio:  2.54 Dina Rich MD Electronically signed by Dina Rich MD Signature Date/Time: 06/27/2020/4:46:50 PM    Final     Catarina Hartshorn, DO  [facility]  If 7PM-7AM, please contact night-coverage [email or web address]  Password [email or web address] 06/30/2020, 9:59 AM   LOS: 4 days

## 2020-06-30 NOTE — Telephone Encounter (Signed)
Called to scheduled hospital follow up per Dr Romeo Apple - "need Xray in 4 weeks, new patient, due to no formal consult done" - scheduled accordingly. Patient/son,contact person, aware.

## 2020-06-30 NOTE — Progress Notes (Signed)
Progress Note  Patient Name: Franklin Gonzalez Date of Encounter: 06/30/2020  [facility] Cardiologist: Olga Millers, MD   Subjective   SOB improving.   Inpatient Medications    Scheduled Meds: . amiodarone  100 mg Oral Daily  . aspirin EC  81 mg Oral Daily  . atorvastatin  10 mg Oral QHS  . enoxaparin (LOVENOX) injection  40 mg Subcutaneous Q24H  . furosemide  60 mg Intravenous Q12H  . insulin aspart  0-5 Units Subcutaneous QHS  . insulin aspart  0-9 Units Subcutaneous TID WC  . potassium chloride  40 mEq Oral BID   Continuous Infusions:  PRN Meds: acetaminophen **OR** acetaminophen, ondansetron **OR** ondansetron (ZOFRAN) IV   Vital Signs    Vitals:   06/29/20 0445 06/29/20 1338 06/29/20 2044 06/30/20 0539  BP: 130/80 117/69 (!) 139/91 129/81  Pulse: 85 81 84 79  Resp: 20 18 20 20   Temp: 98 F (36.7 C) 98.3 F (36.8 C) 98.9 F (37.2 C) 98.4 F (36.9 C)  TempSrc:   Oral Oral  SpO2: 91% 97% 94% 93%  Weight:    70.6 kg  Height:        Intake/Output Summary (Last 24 hours) at 06/30/2020 1026 Last data filed at 06/30/2020 0500 Gross per 24 hour  Intake 480 ml  Output 3450 ml  Net -2970 ml   Last 3 Weights 06/30/2020 06/28/2020 06/27/2020  Weight (lbs) 155 lb 10.3 oz 165 lb 12.6 oz 169 lb 8.5 oz  Weight (kg) 70.6 kg 75.2 kg 76.9 kg      Telemetry    SR - Personally Reviewed  ECG    n/a - Personally Reviewed  Physical Exam   GEN: No acute distress.   Neck: No JVD Cardiac: RRR, no murmurs, rubs, or gallops.  Respiratory: Clear to auscultation bilaterally. GI: Soft, nontender, non-distended  MS: No edema; No deformity. Neuro:  Nonfocal  Psych: Normal affect   Labs    High Sensitivity Troponin:   Recent Labs  Lab 06/26/20 1739 06/26/20 1945  TROPONINIHS 648* 571*      Chemistry Recent Labs  Lab 06/26/20 1739 06/27/20 0352 06/28/20 0608 06/29/20 0520 06/30/20 0529  NA 131*   < > 133* 134* 134*  K 5.2*   < > 3.7 3.1* 3.0*    CL 99   < > 98 97* 95*  CO2 21*   < > 24 25 26   GLUCOSE 226*   < > 176* 153* 176*  BUN 53*   < > 51* 42* 36*  CREATININE 1.56*   < > 1.58* 1.36* 1.22  CALCIUM 8.9   < > 8.1* 7.9* 8.1*  PROT 6.7  --   --   --   --   ALBUMIN 3.7  --   --   --   --   AST 40  --   --   --   --   ALT 44  --   --   --   --   ALKPHOS 90  --   --   --   --   BILITOT 0.7  --   --   --   --   GFRNONAA 40*   < > 40* 48* 54*  ANIONGAP 11   < > 11 12 13    < > = values in this interval not displayed.     Hematology Recent Labs  Lab 06/26/20 1739 06/28/20 0608  WBC 8.8 8.4  RBC 3.73* 3.28*  HGB  11.3* 10.0*  HCT 35.2* 31.4*  MCV 94.4 95.7  MCH 30.3 30.5  MCHC 32.1 31.8  RDW 13.6 13.6  PLT 285 216    BNP Recent Labs  Lab 06/26/20 1739  BNP 3,447.0*     DDimer No results for input(s): DDIMER in the last 168 hours.   Radiology    No results found.  Cardiac Studies     Patient Profile     Franklin Gonzalez is a 86 y.o. male with a hx of CABG 1994, NSTEMI 2014 s/p DES SVG-OM2 & SVG-PDA, DM, HTN, SVT, anemia, who is being seen today for the evaluation of CHF at the request of Dr Tat.  Assessment & Plan    1. Acute systolic HF -  presented with weakness, cough, SOB, LE edema and abdominal distension x 1 week.  - Edema on xray, markedly elevated BNP 3447 - echo LVEF 25%, grade II DDx, mild RV dysfunction. From prior 2014 echo LVEF 45-50% - negative 2.7 L yesterday, neg 6.5 L since admission. Weights are inaccurate - he is on IV lasix 60mg  bid, downtrend in Cr with diuresis consistent with venous congestion and CHF  - he has trifascicular block on EKG and is acutely decompensated, have not started beta blocker, would avoid given conduction disease - AKI on admission that is resolving, may be able to start ACE/ARB/ARNI in next few days, if not then could try bidil - start aldactone 12.5mg  daily.   89 but lives alone, independent in ADLs and no cognitive decline. Difficult decision regarding  possibly cath. I reached out to his primary cardiologist Dr who would favor medical management in setting of ACS and can be readdressed at there outpatient follow up.    2. Elevated troponin - in setting of acute heart failure, trending down - do not suspect ACS   3. SVT - has been on low dose amiodarone chronically, continue   4. Right foot fracture - working with PT  5. Hypokalemia - replacement per primary team.    For questions or updates, please contact [facility] Please consult [email or web address] for contact info under        Signed, Jens Som, MD  06/30/2020, 10:26 AM

## 2020-07-01 DIAGNOSIS — I5021 Acute systolic (congestive) heart failure: Secondary | ICD-10-CM | POA: Diagnosis not present

## 2020-07-01 LAB — BASIC METABOLIC PANEL
Anion gap: 10 (ref 5–15)
BUN: 39 mg/dL — ABNORMAL HIGH (ref 8–23)
CO2: 30 mmol/L (ref 22–32)
Calcium: 8.3 mg/dL — ABNORMAL LOW (ref 8.9–10.3)
Chloride: 96 mmol/L — ABNORMAL LOW (ref 98–111)
Creatinine, Ser: 1.3 mg/dL — ABNORMAL HIGH (ref 0.61–1.24)
GFR, Estimated: 50 mL/min — ABNORMAL LOW (ref >60)
Glucose, Bld: 170 mg/dL — ABNORMAL HIGH (ref 70–99)
Potassium: 3.8 mmol/L (ref 3.5–5.1)
Sodium: 136 mmol/L (ref 135–145)

## 2020-07-01 LAB — GLUCOSE, CAPILLARY
Glucose-Capillary: 160 mg/dL — ABNORMAL HIGH (ref 70–99)
Glucose-Capillary: 202 mg/dL — ABNORMAL HIGH (ref 70–99)
Glucose-Capillary: 214 mg/dL — ABNORMAL HIGH (ref 70–99)
Glucose-Capillary: 198 mg/dL — ABNORMAL HIGH (ref 70–99)

## 2020-07-01 MED ORDER — LOSARTAN POTASSIUM 25 MG PO TABS
12.5000 mg | ORAL_TABLET | Freq: Every day | ORAL | Status: DC
  Administered 2020-07-01 – 2020-07-02 (×2): 12.5 mg via ORAL
  Filled 2020-07-01: qty 1

## 2020-07-01 MED ORDER — TORSEMIDE 20 MG PO TABS
40.0000 mg | ORAL_TABLET | Freq: Every day | ORAL | Status: DC
  Administered 2020-07-01 – 2020-07-02 (×2): 40 mg via ORAL
  Filled 2020-07-01 (×2): qty 2

## 2020-07-01 NOTE — Progress Notes (Signed)
Physical Therapy Treatment Patient Details Name: Franklin Gonzalez MRN: [id number] DOB: 06/21/1922 Today's Date: 07/01/2020    History of Present Illness 86 y.o. male with medical history significant for CABG, SVT, hypertension, diabetes mellitus.Patient presented to the ED with complaints of weakness, cough, difficulty breathing, poor appetite, and progressive swelling.    PT Comments    Pt with improved tolerance to ambulation this date, able to ambulate 120 ft with RW and no physical assist. Pt initially able to rise from chair without physical assist, but with fatigue requires min A. Cued pt to use BUE to assist powering up from sitting and to assist in controlled lowering back to sitting. Educated pt on therapeutic exercise and performing additional reps later with good carryover. Pt's son in room observing session, all questions answered, and pt's son verbalizes ability to assist pt at home due to mobility improvement. Pt on RA throughout session with Spo2 95-97% with minimal SOB noted when ambulating. Pt will benefit from continued physical therapy in hospital and recommendations below to increase strength, balance, endurance for safe ADLs and gait.    Follow Up Recommendations  Supervision/Assistance - 24 hour;Supervision for mobility/OOB     Equipment Recommendations  None recommended by PT    Recommendations for Other Services       Precautions / Restrictions Precautions Precautions: Fall Required Braces or Orthoses: Other Brace Other Brace: R DARCO shoe Restrictions Weight Bearing Restrictions: No RLE Weight Bearing: Weight bearing as tolerated    Mobility  Bed Mobility  General bed mobility comments: in chair upon arrival  Transfers Overall transfer level: Needs assistance Equipment used: Rolling walker (2 wheeled) Transfers: Sit to/from Stand Sit to Stand: Min assist;Min guard  General transfer comment: initially min G with rising up from chair, BUE assisting,  progressing to min A with fatigue  Ambulation/Gait Ambulation/Gait assistance: Min guard Gait Distance (Feet): 120 Feet Assistive device: Rolling walker (2 wheeled) Gait Pattern/deviations: Step-through pattern;Decreased stride length Gait velocity: decreased   General Gait Details: slow cadence with decreased bil step length, able to WB on RLE without pain, no overt LOB or unsteadiness noted, maintains body within RW frame, on RA with SpO2 95-97% with ambulation   Stairs             Wheelchair Mobility    Modified Rankin (Stroke Patients Only)       Balance Overall balance assessment: Needs assistance  Standing balance support: During functional activity;Bilateral upper extremity supported Standing balance-Leahy Scale: Poor Standing balance comment: RW for support       Cognition Arousal/Alertness: Awake/alert Behavior During Therapy: WFL for tasks assessed/performed Overall Cognitive Status: Within Functional Limits for tasks assessed     Exercises General Exercises - Lower Extremity Long Arc Quad: AROM;Strengthening;Seated;Both;15 reps (2 sets) Hip Flexion/Marching: AROM;Strengthening;Seated;Both;15 reps (2 sets)    General Comments        Pertinent Vitals/Pain Pain Assessment: No/denies pain    Home Living                      Prior Function            PT Goals (current goals can now be found in the care plan section) Acute Rehab PT Goals Patient Stated Goal: get stronger PT Goal Formulation: With patient Time For Goal Achievement: 07/11/20 Potential to Achieve Goals: Good Progress towards PT goals: Progressing toward goals    Frequency    Min 3X/week      PT Plan Discharge plan needs  to be updated    Co-evaluation              AM-PAC PT "6 Clicks" Mobility   Outcome Measure  Help needed turning from your back to your side while in a flat bed without using bedrails?: None Help needed moving from lying on your back to  sitting on the side of a flat bed without using bedrails?: A Little Help needed moving to and from a bed to a chair (including a wheelchair)?: A Little Help needed standing up from a chair using your arms (e.g., wheelchair or bedside chair)?: A Little Help needed to walk in hospital room?: None Help needed climbing 3-5 steps with a railing? : A Lot 6 Click Score: 19    End of Session Equipment Utilized During Treatment: Gait belt Activity Tolerance: Patient tolerated treatment well Patient left: in chair;with call bell/phone within reach Nurse Communication: Mobility status;Other (comment) (SpO2 on RA) PT Visit Diagnosis: Unsteadiness on feet (R26.81);Other abnormalities of gait and mobility (R26.89);Muscle weakness (generalized) (M62.81)     Time: 2841-3244 PT Time Calculation (min) (ACUTE ONLY): 30 min  Charges:  $Gait Training: 8-22 mins $Therapeutic Exercise: 8-22 mins                      Tori Dorrell Mit[place]ltree PT, DPT 07/01/20, 1:36 PM [phone]

## 2020-07-01 NOTE — Progress Notes (Signed)
Progress Note  Patient Name: Franklin Gonzalez Date of Encounter: 07/01/2020  [facility] Cardiologist: Olga Millers, MD   Subjective   SOB improving.   Inpatient Medications    Scheduled Meds: . amiodarone  100 mg Oral Daily  . aspirin EC  81 mg Oral Daily  . atorvastatin  10 mg Oral QHS  . enoxaparin (LOVENOX) injection  40 mg Subcutaneous Q24H  . furosemide  60 mg Intravenous Q12H  . insulin aspart  0-5 Units Subcutaneous QHS  . insulin aspart  0-9 Units Subcutaneous TID WC  . polyethylene glycol  17 g Oral Daily  . potassium chloride  40 mEq Oral BID  . senna  1 tablet Oral Daily  . spironolactone  12.5 mg Oral Daily   Continuous Infusions:  PRN Meds: acetaminophen **OR** acetaminophen, ondansetron **OR** ondansetron (ZOFRAN) IV   Vital Signs    Vitals:   06/30/20 0539 06/30/20 1351 06/30/20 2042 07/01/20 0423  BP: 129/81 107/68 124/76 131/77  Pulse: 79 80 81 81  Resp: 20 18 20 20   Temp: 98.4 F (36.9 C) 98.2 F (36.8 C) 98.6 F (37 C) 98.8 F (37.1 C)  TempSrc: Oral Oral Oral Oral  SpO2: 93% 98% 99% 99%  Weight: 70.6 kg   70.8 kg  Height:        Intake/Output Summary (Last 24 hours) at 07/01/2020 0816 Last data filed at 07/01/2020 0500 Gross per 24 hour  Intake 480 ml  Output 2000 ml  Net -1520 ml   Last 3 Weights 07/01/2020 06/30/2020 06/28/2020  Weight (lbs) 156 lb 1.4 oz 155 lb 10.3 oz 165 lb 12.6 oz  Weight (kg) 70.8 kg 70.6 kg 75.2 kg      Telemetry    NSR- Personally Reviewed  ECG    n/a - Personally Reviewed  Physical Exam   GEN: No acute distress.   Neck: No JVD Cardiac: RRR, no murmurs, rubs, or gallops.  Respiratory: mild crackles bases GI: Soft, nontender, non-distended  MS: trace bilateral edema; No deformity. Neuro:  Nonfocal  Psych: Normal affect   Labs    High Sensitivity Troponin:   Recent Labs  Lab 06/26/20 1739 06/26/20 1945  TROPONINIHS 648* 571*      Chemistry Recent Labs  Lab 06/26/20 1739  06/27/20 0352 06/29/20 0520 06/30/20 0529 07/01/20 0408  NA 131*   < > 134* 134* 136  K 5.2*   < > 3.1* 3.0* 3.8  CL 99   < > 97* 95* 96*  CO2 21*   < > 25 26 30   GLUCOSE 226*   < > 153* 176* 170*  BUN 53*   < > 42* 36* 39*  CREATININE 1.56*   < > 1.36* 1.22 1.30*  CALCIUM 8.9   < > 7.9* 8.1* 8.3*  PROT 6.7  --   --   --   --   ALBUMIN 3.7  --   --   --   --   AST 40  --   --   --   --   ALT 44  --   --   --   --   ALKPHOS 90  --   --   --   --   BILITOT 0.7  --   --   --   --   GFRNONAA 40*   < > 48* 54* 50*  ANIONGAP 11   < > 12 13 10    < > = values in this interval not displayed.  Hematology Recent Labs  Lab 06/26/20 1739 06/28/20 0608  WBC 8.8 8.4  RBC 3.73* 3.28*  HGB 11.3* 10.0*  HCT 35.2* 31.4*  MCV 94.4 95.7  MCH 30.3 30.5  MCHC 32.1 31.8  RDW 13.6 13.6  PLT 285 216    BNP Recent Labs  Lab 06/26/20 1739  BNP 3,447.0*     DDimer No results for input(s): DDIMER in the last 168 hours.   Radiology    No results found.  Cardiac Studies     Patient Profile     Franklin Gonzalez a 86 y.o.malewith a hx of CABG 1994, NSTEMI 2014 s/p DES SVG-OM2 & SVG-PDA, DM, HTN, SVT, anemia,who is being seen today for the evaluation ofCHFat the request of Dr Tat.   Assessment & Plan   1. Acute systolic HF -  presented with weakness, cough, SOB, LE edema and abdominal distension x 1 week.  - Edema on xray, markedly elevated BNP 3447 - echo LVEF 25%, grade II DDx, mild RV dysfunction. From prior 2014 echo LVEF 45-50%  - negative 1.4 L yesterday, neg 8 L since admission. Weights suggest at least 10 lbs weight loss - he is on IV lasix 60mg  bid, initial downtrend in Cr with diuresis now trending up. 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 Continue aldactone 12.5mg  daily  89 but lives alone,  independent in ADLs and no cognitive decline. Difficult decision regarding possibly cath. I reached out to his primary cardiologist Dr who would favor medical management in absence of ACS and can be readdressed at there outpatient follow up.    2. Elevated troponin - in setting of acute heart failure, trending down - do not suspect ACS   3. SVT - has been on low dose amiodarone chronically, continue   4. Right foot fracture - working with PT   Likely discharge tomorrow, follow up response to oral diuretic and labs.    For questions or updates, please contact [facility] Please consult [email or web address] for contact info under        Signed, Jens Som, MD  07/01/2020, 8:16 AM

## 2020-07-01 NOTE — Progress Notes (Signed)
PROGRESS NOTE  Franklin Gonzalez ZOX:[id number] DOB: 09/21/1921 DOA: 06/26/2020 PCP: Selinda Flavin, MD  Brief History: 86 year old male with a history of ischemic cardiomyopathy with EF 45-50%, SVT, coronary artery disease with history of MI, hypertension, diabetes mellitus type 2 presenting with approximately 2-week history of shortness of breath. On 06/13/2020 the patient had a mechanical fall resulting in a fracture of the right first metatarsal of the right foot. He went to the ED at [facility] X-rays at that time showed a possible nondisplaced intra-articular fracture of the first metatarsal. The patient was placed in a surgical shoe and sent home. He has been using a walker and a cane since then, and he is still been able to bear weight. However, the patient developed low-grade temperatures with coughing and shortness of breath approximately 1 to 2 days after this ED visit. He had some generalized weakness at that time. He went to his PCP. The patient was placed on cephalexin for presumptive UTI. Unfortunately, he developed some loose stools and had some nausea and vomiting. The nausea, vomiting, and diarrhea ultimately resolved, but the patient continued to have shortness of breath and generalized weakness. The son also noted increasing lower extremity edema and increasing abdominal girth. The patient denied any frank fevers, chills, chest pain, headache, hemoptysis. He does complain of some orthopnea type symptoms. Notably, the patient eats carry out most days of the week. In the emergency department, the patient was afebrile hemodynamically stable with oxygen saturation 95% on room air. BMP showed a sodium 131 with serum creatinine 1.56. WBC was 8.8 with hemoglobin 11.3 and platelets 285,000. AST 40, ALT 44, phosphatase 90, total bilirubin 0.7. Troponin was 648>>571. BNP 3447  Assessment/Plan: Acute systolicand diastolicCHF -09/25/2012 echo EF 45-50%, inferior  wall HK, trivial TR -Continue IV furosemide 40 mg twice daily>>increased to 60 mg bid>>po lasix on 10/26 -NEG 8L -Daily weights--NEG10lbs -Avoidingbeta-blocker due to trifascicular block -Cardiology consultappreciated -CKD limits ACEi/ARB,ARNI -10/22/21echo--EF 25%m global HK, grade DD -remains clinically fluid overloaded -Discontinue amlodipine  Acute respiratory failure with hypoxia -Currently stable on 2 L nasal cannula>>weaned to RA -Secondary to pulmonary edema -Wean oxygen as tolerated -PCT<0.10 -Personally reviewed chest x-ray;bilateral pleural effusion, increased interstitial markings, vascular congestion  CKD stage IIIb -Baseline creatinine 1.1-1.4 -will need to tolerate slightly higher serum creatinine for improved euvolemia -renal function remains stable on diuretics  SVT -Currently in sinus rhythm -Personally reviewed EKG--sinus rhythm,RBBB  Elevated troponin/coronary artery disease -Secondary to fluid overload -No chest pain -Echocardiogram--EF 25%, global HK -Continue aspirinand statin  Essential hypertension -Holding amlodipine and Cardura to allow for blood pressure margin for diuresis -BP well controlled  UncontrolledDiabetes mellitus type 2with hyperglycemia -Holding Metformin -Hemoglobin A1c--8.0  Right foot fracture -PT evaluation  Hyponatremia -Secondary to CHF -Continue diuresis  Hyperlipidemia -continue statin   Status is: Inpatient  Remains inpatient appropriate because:IV treatments appropriate due to intensity of illness or inability to take PO -remains clinically fluid overloaded   Dispo: The patient is from:Home Anticipated d/c is WJ:XBJY Anticipated d/c date is: 2 days Patient currently is not medically stable to d/c.        Family Communication:Son updated 10/26  Consultants:cardiology  Code Status: FULL  DVT Prophylaxis: [place]  Lovenox   Procedures: As Listed in Progress Note Above  Antibiotics: Ceftriaxone 10/21 Doxy 10/21   Subjective: Patient denies fevers, chills, headache, chest pain, dyspnea, nausea, vomiting, diarrhea, abdominal pain, dysuria, hematuria, hematochezia, and melena.   Objective: Vitals:  06/30/20 2042 07/01/20 0423 07/01/20 0900 07/01/20 1453  BP: 124/76 131/77 132/82 110/71  Pulse: 81 81 82 84  Resp: 20 20 20 20   Temp: 98.6 F (37 C) 98.8 F (37.1 C) 98.6 F (37 C) 97.9 F (36.6 C)  TempSrc: Oral Oral Oral Oral  SpO2: 99% 99% 99% 95%  Weight:  70.8 kg    Height:        Intake/Output Summary (Last 24 hours) at 07/01/2020 1907 Last data filed at 07/01/2020 1008 Gross per 24 hour  Intake --  Output 1700 ml  Net -1700 ml   Weight change: 0.2 kg Exam:   General:  Pt is alert, follows commands appropriately, not in acute distress  HEENT: No icterus, No thrush, No neck mass, [place]/AT  Cardiovascular: RRR, S1/S2, no rubs, no gallops  Respiratory: CTA bilaterally, no wheezing, no crackles, no rhonchi  Abdomen: Soft/+BS, non tender, non distended, no guarding  Extremities: 1+LE edema, No lymphangitis, No petechiae, No rashes, no synovitis   Data Reviewed: I have personally reviewed following labs and imaging studies Basic Metabolic Panel: Recent Labs  Lab 06/26/20 1739 06/26/20 1945 06/27/20 0352 06/28/20 0608 06/29/20 0520 06/30/20 0529 07/01/20 0408  NA   < >  --  133* 133* 134* 134* 136  K   < >  --  4.7 3.7 3.1* 3.0* 3.8  CL   < >  --  102 98 97* 95* 96*  CO2   < >  --  22 24 25 26 30   GLUCOSE   < >  --  223* 176* 153* 176* 170*  BUN   < >  --  53* 51* 42* 36* 39*  CREATININE   < >  --  1.43* 1.58* 1.36* 1.22 1.30*  CALCIUM   < >  --  8.2* 8.1* 7.9* 8.1* 8.3*  MG  --  3.0*  --   --  2.2  --   --    < > = values in this interval not displayed.   Liver Function Tests: Recent Labs  Lab 06/26/20 1739  AST 40  ALT 44  ALKPHOS 90  BILITOT 0.7   PROT 6.7  ALBUMIN 3.7   No results for input(s): LIPASE, AMYLASE in the last 168 hours. No results for input(s): AMMONIA in the last 168 hours. Coagulation Profile: No results for input(s): INR, PROTIME in the last 168 hours. CBC: Recent Labs  Lab 06/26/20 1739 06/28/20 0608  WBC 8.8 8.4  NEUTROABS 7.1  --   HGB 11.3* 10.0*  HCT 35.2* 31.4*  MCV 94.4 95.7  PLT 285 216   Cardiac Enzymes: No results for input(s): CKTOTAL, CKMB, CKMBINDEX, TROPONINI in the last 168 hours. BNP: Invalid input(s): POCBNP CBG: Recent Labs  Lab 06/30/20 1654 06/30/20 2043 07/01/20 0756 07/01/20 1133 07/01/20 1648  GLUCAP 159* 194* 160* 202* 214*   HbA1C: No results for input(s): HGBA1C in the last 72 hours. Urine analysis:    Component Value Date/Time   COLORURINE YELLOW 06/27/2020 0753   APPEARANCEUR CLEAR 06/27/2020 0753   LABSPEC 1.020 06/27/2020 0753   PHURINE 5.0 06/27/2020 0753   GLUCOSEU NEGATIVE 06/27/2020 0753   HGBUR NEGATIVE 06/27/2020 0753   BILIRUBINUR NEGATIVE 06/27/2020 0753   KETONESUR NEGATIVE 06/27/2020 0753   PROTEINUR NEGATIVE 06/27/2020 0753   UROBILINOGEN 1.0 10/03/2012 1151   NITRITE NEGATIVE 06/27/2020 0753   LEUKOCYTESUR NEGATIVE 06/27/2020 0753   Sepsis Labs: @LABRCNTIP (procalcitonin:4,lacticidven:4) ) Recent Results (from the past 240 hour(s))  Respiratory Panel  by RT PCR (Flu A&B, Covid) - Nasopharyngeal Swab     Status: None   Collection Time: 06/26/20  7:06 PM   Specimen: Nasopharyngeal Swab  Result Value Ref Range Status   SARS Coronavirus 2 by RT PCR NEGATIVE NEGATIVE Final    Comment: (NOTE) SARS-CoV-2 target nucleic acids are NOT DETECTED.  The SARS-CoV-2 RNA is generally detectable in upper respiratoy specimens during the acute phase of infection. The lowest concentration of SARS-CoV-2 viral copies this assay can detect is 131 copies/mL. A negative result does not preclude SARS-Cov-2 infection and should not be used as the sole basis for  treatment or other patient management decisions. A negative result may occur with  improper specimen collection/handling, submission of specimen other than nasopharyngeal swab, presence of viral mutation(s) within the areas targeted by this assay, and inadequate number of viral copies (<131 copies/mL). A negative result must be combined with clinical observations, patient history, and epidemiological information. The expected result is Negative.  Fact Sheet for Patients:  https://www.moore.com/  Fact Sheet for Healthcare Providers:  https://www.young.biz/  This test is no t yet approved or cleared by the [place] FDA and  has been authorized for detection and/or diagnosis of SARS-CoV-2 by FDA under an Emergency Use Authorization (EUA). This EUA will remain  in effect (meaning this test can be used) for the duration of the COVID-19 declaration under Section 564(b)(1) of the Act, 21 U.S.C. section 360bbb-3(b)(1), unless the authorization is terminated or revoked sooner.     Influenza A by PCR NEGATIVE NEGATIVE Final   Influenza B by PCR NEGATIVE NEGATIVE Final    Comment: (NOTE) The Xpert Xpress SARS-CoV-2/FLU/RSV assay is intended as an aid in  the diagnosis of influenza from Nasopharyngeal swab specimens and  should not be used as a sole basis for treatment. Nasal washings and  aspirates are unacceptable for Xpert Xpress SARS-CoV-2/FLU/RSV  testing.  Fact Sheet for Patients: https://www.moore.com/  Fact Sheet for Healthcare Providers: https://www.young.biz/  This test is not yet approved or cleared by the [place] FDA and  has been authorized for detection and/or diagnosis of SARS-CoV-2 by  FDA under an Emergency Use Authorization (EUA). This EUA will remain  in effect (meaning this test can be used) for the duration of the  Covid-19 declaration under Section 564(b)(1) of the Act, 21    U.S.C. section 360bbb-3(b)(1), unless the authorization is  terminated or revoked. Performed at [facility], [address] [place], [place] [address]   Culture, Urine     Status: None   Collection Time: 06/27/20  7:53 AM   Specimen: Urine, Clean Catch  Result Value Ref Range Status   Specimen Description   Final    URINE, CLEAN CATCH Performed at [facility], [address] [place], [place] [address]    Special Requests   Final    NONE Performed at [facility], [address] [place], [place] [address]    Culture   Final    NO GROWTH Performed at [facility], [address] [place], [place] [address]    Report Status 06/28/2020 FINAL  Final     Scheduled Meds: . amiodarone  100 mg Oral Daily  . aspirin EC  81 mg Oral Daily  . atorvastatin  10 mg Oral QHS  . enoxaparin (LOVENOX) injection  40 mg Subcutaneous Q24H  . insulin aspart  0-5 Units Subcutaneous QHS  . insulin aspart  0-9 Units Subcutaneous TID WC  . losartan  12.5  mg Oral Daily  . polyethylene glycol  17 g Oral Daily  . potassium chloride  40 mEq Oral BID  . senna  1 tablet Oral Daily  . spironolactone  12.5 mg Oral Daily  . torsemide  40 mg Oral Daily   Continuous Infusions:  Procedures/Studies: DG Chest Port 1 View  Result Date: 06/26/2020 CLINICAL DATA:  Cough EXAM: PORTABLE CHEST 1 VIEW COMPARISON:  06/26/2020, 10/26/2018 FINDINGS: Post sternotomy changes. Small left greater than right pleural effusion with dense consolidation left lung base. Enlarged cardiomediastinal silhouette with mild diffuse interstitial opacity suspicious for pulmonary edema. Aortic atherosclerosis. No pneumothorax. IMPRESSION: Small left greater than right pleural effusions with dense consolidation at the left lung base, atelectasis versus pneumonia. Cardiomegaly with mild diffuse interstitial opacity suspect for mild pulmonary edema. Electronically Signed   By: Jasmine Pang M.D.   On: 06/26/2020 18:03   ECHOCARDIOGRAM  COMPLETE  Result Date: 06/27/2020    ECHOCARDIOGRAM REPORT   Patient Name:   Franklin Gonzalez Date of Exam: 06/27/2020 Medical Rec #:  [id number]       Height:       71.0 in Accession #:    [id number]      Weight:       171.1 lb Date of Birth:  1922/05/06       BSA:          1.973 m Patient Age:    89 years        BP:           114/75 mmHg Patient Gender: M               HR:           85 bpm. [facility] Procedure: 2D Echo Indications:    Congestive Heart Failure 428.0 / I50.9  History:        Patient has prior history of Echocardiogram examinations, most                 recent 10/03/2012. CHF, Signs/Symptoms:Fever; Risk                 Factors:Hypertension and Non-Smoker. SVT, Acute myocardial                 infarction.  Sonographer:    Jeryl Columbia RDCS (AE) Referring Phys: [id number] [facility] IMPRESSIONS  1. Left ventricular ejection fraction, by estimation, is 25%. The left ventricle has severely decreased function. The left ventricle demonstrates global hypokinesis. The left ventricular internal cavity size was mildly dilated. There is mild left ventricular hypertrophy. Left ventricular diastolic parameters are consistent with Grade II diastolic dysfunction (pseudonormalization). Elevated left atrial pressure.  2. Right ventricular systolic function is mildly reduced. The right ventricular size is normal.  3. Left atrial size was severely dilated.  4. Right atrial size was mildly dilated.  5. The mitral valve is normal in structure. Mild mitral valve regurgitation. No evidence of mitral stenosis.  6. The aortic valve is tricuspid. There is moderate calcification of the aortic valve. There is moderate thickening of the aortic valve. Aortic valve regurgitation is not visualized. No aortic stenosis is present.  7. The inferior vena cava is dilated in size with >50% respiratory variability, suggesting right atrial pressure of 8 mmHg. FINDINGS  Left Ventricle: Left ventricular ejection  fraction, by estimation, is 25%. The left ventricle has severely decreased function. The left ventricle demonstrates global hypokinesis. The left ventricular internal cavity size was mildly dilated.  There is mild left ventricular hypertrophy. Left ventricular diastolic parameters are consistent with Grade II diastolic dysfunction (pseudonormalization). Elevated left atrial pressure. Right Ventricle: The right ventricular size is normal. Right vetricular wall thickness was not assessed. Right ventricular systolic function is mildly reduced. Left Atrium: Left atrial size was severely dilated. Right Atrium: Right atrial size was mildly dilated. Pericardium: There is no evidence of pericardial effusion. Mitral Valve: The mitral valve is normal in structure. Mild mitral valve regurgitation. No evidence of mitral valve stenosis. Tricuspid Valve: The tricuspid valve is normal in structure. Tricuspid valve regurgitation is trivial. No evidence of tricuspid stenosis. Aortic Valve: The aortic valve is tricuspid. There is moderate calcification of the aortic valve. There is moderate thickening of the aortic valve. There is moderate aortic valve annular calcification. Aortic valve regurgitation is not visualized. No aortic stenosis is present. Aortic valve mean gradient measures 2.3 mmHg. Aortic valve peak gradient measures 3.9 mmHg. Aortic valve area, by VTI measures 2.00 cm. Pulmonic Valve: The pulmonic valve was not well visualized. Pulmonic valve regurgitation is not visualized. No evidence of pulmonic stenosis. Aorta: The aortic root is normal in size and structure. Pulmonary Artery: Indeterminant PASP, inadequate TR jet. Venous: The inferior vena cava is dilated in size with greater than 50% respiratory variability, suggesting right atrial pressure of 8 mmHg. IAS/Shunts: No atrial level shunt detected by color flow Doppler.  LEFT VENTRICLE PLAX 2D LVIDd:         5.94 cm  Diastology LVIDs:         5.25 cm  LV e' medial:     3.15 cm/s LV PW:         1.23 cm  LV E/e' medial:  25.6 LV IVS:        1.25 cm  LV e' lateral:   5.93 cm/s LVOT diam:     2.10 cm  LV E/e' lateral: 13.6 LV SV:         34 LV SV Index:   17 LVOT Area:     3.46 cm  RIGHT VENTRICLE RV S prime:     7.13 cm/s TAPSE (M-mode): 1.6 cm LEFT ATRIUM             Index       RIGHT ATRIUM           Index LA diam:        4.40 cm 2.23 cm/m  RA Area:     19.80 cm LA Vol (A2C):   96.2 ml 48.76 ml/m RA Volume:   62.60 ml  31.73 ml/m LA Vol (A4C):   88.2 ml 44.70 ml/m LA Biplane Vol: 97.8 ml 49.57 ml/m  AORTIC VALVE AV Area (Vmax):    2.64 cm AV Area (Vmean):   2.13 cm AV Area (VTI):     2.00 cm AV Vmax:           98.41 cm/s AV Vmean:          73.679 cm/s AV VTI:            0.171 m AV Peak Grad:      3.9 mmHg AV Mean Grad:      2.3 mmHg LVOT Vmax:         74.98 cm/s LVOT Vmean:        45.358 cm/s LVOT VTI:          0.099 m LVOT/AV VTI ratio: 0.58  AORTA Ao Root diam: 3.40 cm MITRAL VALVE MV Area (PHT): 3.97  cm    SHUNTS MV Decel Time: 191 msec    Systemic VTI:  0.10 m MV E velocity: 80.60 cm/s  Systemic Diam: 2.10 cm MV A velocity: 31.70 cm/s MV E/A ratio:  2.54 Dina Rich MD Electronically signed by Dina Rich MD Signature Date/Time: 06/27/2020/4:46:50 PM    Final     Catarina Hartshorn, DO  [facility]  If 7PM-7AM, please contact night-coverage [email or web address] Password [email or web address] 07/01/2020, 7:07 PM   LOS: 5 days

## 2020-07-02 DIAGNOSIS — I2510 Atherosclerotic heart disease of native coronary artery without angina pectoris: Secondary | ICD-10-CM

## 2020-07-02 DIAGNOSIS — I5021 Acute systolic (congestive) heart failure: Secondary | ICD-10-CM | POA: Diagnosis not present

## 2020-07-02 DIAGNOSIS — I2583 Coronary atherosclerosis due to lipid rich plaque: Secondary | ICD-10-CM

## 2020-07-02 LAB — BASIC METABOLIC PANEL
Anion gap: 10 (ref 5–15)
BUN: 41 mg/dL — ABNORMAL HIGH (ref 8–23)
CO2: 28 mmol/L (ref 22–32)
Calcium: 8.3 mg/dL — ABNORMAL LOW (ref 8.9–10.3)
Chloride: 98 mmol/L (ref 98–111)
Creatinine, Ser: 1.29 mg/dL — ABNORMAL HIGH (ref 0.61–1.24)
GFR, Estimated: 51 mL/min — ABNORMAL LOW (ref >60)
Glucose, Bld: 160 mg/dL — ABNORMAL HIGH (ref 70–99)
Potassium: 4.9 mmol/L (ref 3.5–5.1)
Sodium: 136 mmol/L (ref 135–145)

## 2020-07-02 LAB — GLUCOSE, CAPILLARY
Glucose-Capillary: 152 mg/dL — ABNORMAL HIGH (ref 70–99)
Glucose-Capillary: 159 mg/dL — ABNORMAL HIGH (ref 70–99)

## 2020-07-02 LAB — MAGNESIUM: Magnesium: 2.1 mg/dL (ref 1.7–2.4)

## 2020-07-02 MED ORDER — SPIRONOLACTONE 25 MG PO TABS
12.5000 mg | ORAL_TABLET | Freq: Every day | ORAL | 1 refills | Status: DC
Start: 2020-07-03 — End: 2022-05-13

## 2020-07-02 MED ORDER — POTASSIUM CHLORIDE CRYS ER 20 MEQ PO TBCR
20.0000 meq | EXTENDED_RELEASE_TABLET | Freq: Every day | ORAL | 1 refills | Status: DC
Start: 2020-07-02 — End: 2022-06-20

## 2020-07-02 MED ORDER — METFORMIN HCL ER 500 MG PO TB24: 500.0000 mg | ORAL_TABLET | Freq: Every day | ORAL | Status: DC

## 2020-07-02 MED ORDER — LOSARTAN POTASSIUM 25 MG PO TABS
12.5000 mg | ORAL_TABLET | Freq: Every day | ORAL | 1 refills | Status: DC
Start: 2020-07-03 — End: 2020-08-21

## 2020-07-02 MED ORDER — TORSEMIDE 20 MG PO TABS
40.0000 mg | ORAL_TABLET | Freq: Every day | ORAL | 1 refills | Status: DC
Start: 2020-07-03 — End: 2020-11-25

## 2020-07-02 NOTE — Care Management Important Message (Signed)
Important Message  Patient Details  Name: Franklin Gonzalez MRN: [id number] Date of Birth: 1922/08/31   Medicare Important Message Given:  Yes - Important Message mailed due to current National Emergency     Corey Harold 07/02/2020, 11:01 AM

## 2020-07-02 NOTE — Progress Notes (Addendum)
Physical Therapy Treatment Patient Details Name: Franklin Gonzalez MRN: [id number] DOB: 1921-12-31 Today's Date: 07/02/2020    History of Present Illness 86 y.o. male with medical history significant for CABG, SVT, hypertension, diabetes mellitus.Patient presented to the ED with complaints of weakness, cough, difficulty breathing, poor appetite, and progressive swelling.    PT Comments    Patient demonstrates good return for completing BLE ROM/strengthening exercises while seated at EOB requiring minimal verbal cueing, had mild difficulty completing sit to stands requiring increased time, ambulated in room/hallway without loss of balance or c/o pain on right foot, limited mostly due to c/o fatigue and tolerated sitting up in chair after therapy.  Patient will benefit from continued physical therapy in hospital and recommended venue below to increase strength, balance, endurance for safe ADLs and gait.    Follow Up Recommendations  Supervision/Assistance - 24 hour;Supervision for mobility/OOB;Home health PT     Equipment Recommendations  None recommended by PT    Recommendations for Other Services       Precautions / Restrictions Precautions Precautions: Fall Precaution Comments: foot fracture Required Braces or Orthoses: Other Brace Other Brace: R DARCO shoe Restrictions Weight Bearing Restrictions: No RLE Weight Bearing: Weight bearing as tolerated    Mobility  Bed Mobility Overal bed mobility: Modified Independent             General bed mobility comments: HOB raised  Transfers Overall transfer level: Needs assistance Equipment used: Rolling walker (2 wheeled) Transfers: Sit to/from [employer] Sit to Stand: Supervision;Min guard Stand pivot transfers: Supervision       General transfer comment: increased time to complete sit to stands due to generalized weakness  Ambulation/Gait Ambulation/Gait assistance: Supervision;Min guard Gait Distance  (Feet): 75 Feet Assistive device: Rolling walker (2 wheeled) Gait Pattern/deviations: Decreased step length - right;Decreased step length - left;Decreased stride length Gait velocity: decreased   General Gait Details: slightly labored slow cadence without loss of balance, no c/o pain on right foot when weightbearing, limited mostly due to fatigue   Stairs             Wheelchair Mobility    Modified Rankin (Stroke Patients Only)       Balance Overall balance assessment: Needs assistance Sitting-balance support: Feet unsupported;No upper extremity supported Sitting balance-Leahy Scale: Good Sitting balance - Comments: seated EOB   Standing balance support: During functional activity;Bilateral upper extremity supported Standing balance-Leahy Scale: Fair Standing balance comment: using RW                            Cognition Arousal/Alertness: Awake/alert Behavior During Therapy: WFL for tasks assessed/performed Overall Cognitive Status: Within Functional Limits for tasks assessed                                        Exercises General Exercises - Lower Extremity Long Arc Quad: AROM;Strengthening;Seated;Both;10 reps Hip Flexion/Marching: Seated;AROM;Strengthening;Both;10 reps Toe Raises: Seated;AROM;Strengthening;Both;20 reps Heel Raises: Seated;AROM;Strengthening;Both;20 reps    General Comments        Pertinent Vitals/Pain Pain Assessment: No/denies pain    Home Living                      Prior Function            PT Goals (current goals can now be found in the care plan section) Acute  Rehab PT Goals Patient Stated Goal: get stronger PT Goal Formulation: With patient Time For Goal Achievement: 07/11/20 Potential to Achieve Goals: Good Progress towards PT goals: Progressing toward goals    Frequency    Min 3X/week      PT Plan Current plan remains appropriate    Co-evaluation              AM-PAC  PT "6 Clicks" Mobility   Outcome Measure  Help needed turning from your back to your side while in a flat bed without using bedrails?: None Help needed moving from lying on your back to sitting on the side of a flat bed without using bedrails?: A Little Help needed moving to and from a bed to a chair (including a wheelchair)?: A Little Help needed standing up from a chair using your arms (e.g., wheelchair or bedside chair)?: A Little Help needed to walk in hospital room?: A Little Help needed climbing 3-5 steps with a railing? : A Lot 6 Click Score: 18    End of Session   Activity Tolerance: Patient tolerated treatment well;Patient limited by fatigue Patient left: in chair;with call bell/phone within reach Nurse Communication: Mobility status;Other (comment) PT Visit Diagnosis: Unsteadiness on feet (R26.81);Other abnormalities of gait and mobility (R26.89);Muscle weakness (generalized) (M62.81)     Time: 1110-1130 PT Time Calculation (min) (ACUTE ONLY): 20 min  Charges:  $Gait Training: 8-22 mins $Therapeutic Exercise: 8-22 mins                     11:41 AM, 07/02/20 Franklin Gonzalez, MPT Physical Therapist with [facility] 336 [phone] office [phone] mobile phone

## 2020-07-02 NOTE — Discharge Summary (Signed)
Physician Discharge Summary  Franklin Gonzalez RUE:[id number] DOB: 02-06-1922 DOA: 06/26/2020  PCP: Selinda Flavin, MD  Admit date: 06/26/2020 Discharge date: 07/02/2020  Time spent: 35 minutes minutes  Recommendations for Outpatient Follow-up:  1. Repeat basic metabolic panel to follow electrolytes and renal function. 2. Reassess CBGs and adjust hypoglycemic regimen as required especially with substitution of Metformin if renal function demands it. 3. Outpatient follow-up with cardiology service for further adjustment to his CHF therapy.   Discharge Diagnoses:  Principal Problem:   Acute CHF (congestive heart failure) (HCC) Active Problems:   SVT (supraventricular tachycardia) (HCC)   HTN (hypertension)   CAD (coronary artery disease)   Acute respiratory failure with hypoxia (HCC)   Acute systolic CHF (congestive heart failure) (HCC)   CKD (chronic kidney disease) stage 3, GFR 30-59 ml/min (HCC)   Hyponatremia   Systolic and diastolic CHF, acute (HCC)   Discharge Condition: Stable and improved. Discharged home with instructions to follow-up with PCP in 10 days and with cardiology service in 1 week.  CODE STATUS: DNR.  Diet recommendation: Heart healthy/low-sodium and modify carbohydrates diet.  Filed Weights   06/30/20 0539 07/01/20 0423 07/02/20 0541  Weight: 70.6 kg 70.8 kg 70.8 kg    History of present illness:  86 year old male with a history of ischemic cardiomyopathy with EF 45-50%, SVT, coronary artery disease with history of MI, hypertension, diabetes mellitus type 2 presenting with approximately 2-week history of shortness of breath. On 06/13/2020 the patient had a mechanical fall resulting in a fracture of the right first metatarsal of the right foot. He went to the ED at [facility]. X-rays at that time showed a possible nondisplaced intra-articular fracture of the first metatarsal. The patient was placed in a surgical shoe and sent home. He has been using a  walker and a cane since then, and he is still been able to bear weight. However, the patient developed low-grade temperatures with coughing and shortness of breath approximately 1 to 2 days after this ED visit. He had some generalized weakness at that time. He went to his PCP. The patient was placed on cephalexin for presumptive UTI. Unfortunately, he developed some loose stools and had some nausea and vomiting. The nausea, vomiting, and diarrhea ultimately resolved, but the patient continued to have shortness of breath and generalized weakness. The son also noted increasing lower extremity edema and increasing abdominal girth. The patient denied any frank fevers, chills, chest pain, headache, hemoptysis. He does complain of some orthopnea type symptoms. Notably, the patient eats carry out most days of the week. In the emergency department, the patient was afebrile hemodynamically stable with oxygen saturation 95% on room air. BMP showed a sodium 131 with serum creatinine 1.56. WBC was 8.8 with hemoglobin 11.3 and platelets 285,000. AST 40, ALT 44, phosphatase 90, total bilirubin 0.7. Troponin was 648>>571. BNP 3447  Hospital Course:  1-acute systolic CHF exacerbation -Patient presented with worsening dyspnea, weakness, lower extremity edema and abdominal distention; BNP was elevated on admission and echo showed reduced EF and mild RV dysfunction. -patient responded well to IV diuresis -cardiology was consulted and decision to pursuit medical management was decided. -patient has been discharge on losartan, spironolactone and torsemide. -no b-blocker given trifascicular AV block. -Patient has been instructed to follow low-sodium diet and to check his weight on daily basis -outpatient follow-up with cardiology service has been scheduled.  2-coronary artery disease/elevated troponin -Patient denies chest pain -Per cardiology service elevated troponin most likely in the setting of demand  ischemia from acute CHF and no consistent with acute coronary syndrome -Continue aspirin and statins -Outpatient follow-up to determine if cardiac cath will an option down the road.  3-hyperlipidemia -Continue statins. -Heart healthy diet has been encouraged.  4-history of SVT -Patient has remained in normal sinus rhythm with rate control. -Continue the use of amiodarone daily.  5-acute on chronic stage IIIb chronic kidney disease -Baseline creatinine 1.2-1.3; peaked at 1.58 -Most likely in the setting of diuresis. -Creatinine 1.29 at time of discharge -Will recommend close monitoring of patient's electrolytes and renal function as an outpatient.  6-hyponatremia -In the setting of fluid overload -Resolved after diuresis. -At discharge sodium 136. -Continue to follow electrolytes intermittently.  Procedures: -see below for x-ray reports -Echocardiogram: 06/27/2020 IMPRESSIONS  1. Left ventricular ejection fraction, by estimation, is 25%. The left  ventricle has severely decreased function. The left ventricle demonstrates  global hypokinesis. The left ventricular internal cavity size was mildly  dilated. There is mild left  ventricular hypertrophy. Left ventricular diastolic parameters are  consistent with Grade II diastolic dysfunction (pseudonormalization).  Elevated left atrial pressure.  2. Right ventricular systolic function is mildly reduced. The right  ventricular size is normal.  3. Left atrial size was severely dilated.  4. Right atrial size was mildly dilated.  5. The mitral valve is normal in structure. Mild mitral valve  regurgitation. No evidence of mitral stenosis.  6. The aortic valve is tricuspid. There is moderate calcification of the  aortic valve. There is moderate thickening of the aortic valve. Aortic  valve regurgitation is not visualized. No aortic stenosis is present.  7. The inferior vena cava is dilated in size with >50% respiratory   variability, suggesting right atrial pressure of 8 mmHg.   Consultations:  Cardiology service  Discharge Exam: Vitals:   07/01/20 2029 07/02/20 0541  BP: 111/73 122/67  Pulse: 82 81  Resp: 20 18  Temp: 99 F (37.2 C) 97.9 F (36.6 C)  SpO2: 97% 94%    General: Afebrile, no chest pain, no nausea, no vomiting. Good oxygen saturation on room air and expressing no orthopnea. Still with intermittent coughing but feeling ready to go home. Cardiovascular: Rate controlled, no rubs, no gallops, no JVD. Respiratory: Good air movement bilaterally, no use of accessory muscles. Abdomen: Soft, nontender, distended, positive bowel sounds Extremities: No cyanosis or clubbing.  Discharge Instructions   Discharge Instructions    (HEART FAILURE PATIENTS) Call MD:  Anytime you have any of the following symptoms: 1) 3 pound weight gain in 24 hours or 5 pounds in 1 week 2) shortness of breath, with or without a dry hacking cough 3) swelling in the hands, feet or stomach 4) if you have to sleep on extra pillows at night in order to breathe.   Complete by: As directed    Diet - low sodium heart healthy   Complete by: As directed    Discharge instructions   Complete by: As directed    Follow low sodium diet (2 gram of sodium daily) Maintain adequate hydration Follow up with cardiology as instructed (office will arrange follow up) Follow up in 10 days with PCP Take medications as prescribed Check weight on daily basis     Allergies as of 07/02/2020   No Known Allergies     Medication List    STOP taking these medications   amLODipine 5 MG tablet Commonly known as: NORVASC   cephALEXin 500 MG capsule Commonly known as: KEFLEX  TAKE these medications   amiodarone 200 MG tablet Commonly known as: Pacerone Take 1/2 (one-half) tablet by mouth once daily   aspirin 81 MG EC tablet Take 1 tablet (81 mg total) by mouth daily.   atorvastatin 10 MG tablet Commonly known as:  LIPITOR Take 10 mg by mouth at bedtime.   doxazosin 4 MG tablet Commonly known as: CARDURA Take 4 mg by mouth at bedtime.   levothyroxine 25 MCG tablet Commonly known as: Synthroid Take 1 tablet (25 mcg total) by mouth daily before breakfast.   losartan 25 MG tablet Commonly known as: COZAAR Take 0.5 tablets (12.5 mg total) by mouth daily. Start taking on: July 03, 2020   metFORMIN 500 MG 24 hr tablet Commonly known as: GLUCOPHAGE-XR Take 1 tablet (500 mg total) by mouth daily. Start taking on: July 05, 2020 What changed: These instructions start on July 05, 2020. If you are unsure what to do until then, ask your doctor or other care provider.   nitroGLYCERIN 0.4 MG SL tablet Commonly known as: NITROSTAT Place 1 tablet (0.4 mg total) under the tongue every 5 (five) minutes x 3 doses as needed for chest pain.   ondansetron 4 MG tablet Commonly known as: ZOFRAN Take by mouth.   potassium chloride SA 20 MEQ tablet Commonly known as: KLOR-CON Take 1 tablet (20 mEq total) by mouth daily.   spironolactone 25 MG tablet Commonly known as: ALDACTONE Take 0.5 tablets (12.5 mg total) by mouth daily. Start taking on: July 03, 2020   torsemide 20 MG tablet Commonly known as: DEMADEX Take 2 tablets (40 mg total) by mouth daily. Start taking on: July 03, 2020      No Known Allergies  Follow-up Information    [facility] Follow [facility].   Specialty: Home Health Services Why: HHPT       Selinda Flavin, MD. Schedule an appointment as soon as possible for a visit.   Specialty: Family Medicine Contact information: [address] [place] [address] [phone]        Lewayne Bunting, MD .   Specialty: Cardiology Contact information: [address] [place] [address] [phone]                The results of significant diagnostics from this hospitalization (including imaging, microbiology, ancillary and laboratory)  are listed below for reference.    Significant Diagnostic Studies: DG Chest Port 1 View  Result Date: 06/26/2020 CLINICAL DATA:  Cough EXAM: PORTABLE CHEST 1 VIEW COMPARISON:  06/26/2020, 10/26/2018 FINDINGS: Post sternotomy changes. Small left greater than right pleural effusion with dense consolidation left lung base. Enlarged cardiomediastinal silhouette with mild diffuse interstitial opacity suspicious for pulmonary edema. Aortic atherosclerosis. No pneumothorax. IMPRESSION: Small left greater than right pleural effusions with dense consolidation at the left lung base, atelectasis versus pneumonia. Cardiomegaly with mild diffuse interstitial opacity suspect for mild pulmonary edema. Electronically Signed   By: Jasmine Pang M.D.   On: 06/26/2020 18:03   ECHOCARDIOGRAM COMPLETE  Result Date: 06/27/2020    ECHOCARDIOGRAM REPORT   Patient Name:   ASTOR GENTLE Date of Exam: 06/27/2020 Medical Rec #:  [id number]       Height:       71.0 in Accession #:    [id number]      Weight:       171.1 lb Date of Birth:  [birth year]       BSA:  1.973 m Patient Age:    89 years        BP:           114/75 mmHg Patient Gender: M               HR:           85 bpm. [facility] Procedure: 2D Echo Indications:    Congestive Heart Failure 428.0 / I50.9  History:        Patient has prior history of Echocardiogram examinations, most                 recent 10/03/2012. CHF, Signs/Symptoms:Fever; Risk                 Factors:Hypertension and Non-Smoker. SVT, Acute myocardial                 infarction.  Sonographer:    Jeryl Columbia RDCS (AE) Referring Phys: [id number] [facility] IMPRESSIONS  1. Left ventricular ejection fraction, by estimation, is 25%. The left ventricle has severely decreased function. The left ventricle demonstrates global hypokinesis. The left ventricular internal cavity size was mildly dilated. There is mild left ventricular hypertrophy. Left ventricular diastolic parameters are  consistent with Grade II diastolic dysfunction (pseudonormalization). Elevated left atrial pressure.  2. Right ventricular systolic function is mildly reduced. The right ventricular size is normal.  3. Left atrial size was severely dilated.  4. Right atrial size was mildly dilated.  5. The mitral valve is normal in structure. Mild mitral valve regurgitation. No evidence of mitral stenosis.  6. The aortic valve is tricuspid. There is moderate calcification of the aortic valve. There is moderate thickening of the aortic valve. Aortic valve regurgitation is not visualized. No aortic stenosis is present.  7. The inferior vena cava is dilated in size with >50% respiratory variability, suggesting right atrial pressure of 8 mmHg. FINDINGS  Left Ventricle: Left ventricular ejection fraction, by estimation, is 25%. The left ventricle has severely decreased function. The left ventricle demonstrates global hypokinesis. The left ventricular internal cavity size was mildly dilated. There is mild left ventricular hypertrophy. Left ventricular diastolic parameters are consistent with Grade II diastolic dysfunction (pseudonormalization). Elevated left atrial pressure. Right Ventricle: The right ventricular size is normal. Right vetricular wall thickness was not assessed. Right ventricular systolic function is mildly reduced. Left Atrium: Left atrial size was severely dilated. Right Atrium: Right atrial size was mildly dilated. Pericardium: There is no evidence of pericardial effusion. Mitral Valve: The mitral valve is normal in structure. Mild mitral valve regurgitation. No evidence of mitral valve stenosis. Tricuspid Valve: The tricuspid valve is normal in structure. Tricuspid valve regurgitation is trivial. No evidence of tricuspid stenosis. Aortic Valve: The aortic valve is tricuspid. There is moderate calcification of the aortic valve. There is moderate thickening of the aortic valve. There is moderate aortic valve annular  calcification. Aortic valve regurgitation is not visualized. No aortic stenosis is present. Aortic valve mean gradient measures 2.3 mmHg. Aortic valve peak gradient measures 3.9 mmHg. Aortic valve area, by VTI measures 2.00 cm. Pulmonic Valve: The pulmonic valve was not well visualized. Pulmonic valve regurgitation is not visualized. No evidence of pulmonic stenosis. Aorta: The aortic root is normal in size and structure. Pulmonary Artery: Indeterminant PASP, inadequate TR jet. Venous: The inferior vena cava is dilated in size with greater than 50% respiratory variability, suggesting right atrial pressure of 8 mmHg. IAS/Shunts: No atrial level shunt detected by color  flow Doppler.  LEFT VENTRICLE PLAX 2D LVIDd:         5.94 cm  Diastology LVIDs:         5.25 cm  LV e' medial:    3.15 cm/s LV PW:         1.23 cm  LV E/e' medial:  25.6 LV IVS:        1.25 cm  LV e' lateral:   5.93 cm/s LVOT diam:     2.10 cm  LV E/e' lateral: 13.6 LV SV:         34 LV SV Index:   17 LVOT Area:     3.46 cm  RIGHT VENTRICLE RV S prime:     7.13 cm/s TAPSE (M-mode): 1.6 cm LEFT ATRIUM             Index       RIGHT ATRIUM           Index LA diam:        4.40 cm 2.23 cm/m  RA Area:     19.80 cm LA Vol (A2C):   96.2 ml 48.76 ml/m RA Volume:   62.60 ml  31.73 ml/m LA Vol (A4C):   88.2 ml 44.70 ml/m LA Biplane Vol: 97.8 ml 49.57 ml/m  AORTIC VALVE AV Area (Vmax):    2.64 cm AV Area (Vmean):   2.13 cm AV Area (VTI):     2.00 cm AV Vmax:           98.41 cm/s AV Vmean:          73.679 cm/s AV VTI:            0.171 m AV Peak Grad:      3.9 mmHg AV Mean Grad:      2.3 mmHg LVOT Vmax:         74.98 cm/s LVOT Vmean:        45.358 cm/s LVOT VTI:          0.099 m LVOT/AV VTI ratio: 0.58  AORTA Ao Root diam: 3.40 cm MITRAL VALVE MV Area (PHT): 3.97 cm    SHUNTS MV Decel Time: 191 msec    Systemic VTI:  0.10 m MV E velocity: 80.60 cm/s  Systemic Diam: 2.10 cm MV A velocity: 31.70 cm/s MV E/A ratio:  2.54 Dina RichJonathan Branch MD Electronically  signed by Dina RichJonathan Branch MD Signature Date/Time: 06/27/2020/4:46:50 PM    Final     Microbiology: Recent Results (from the past 240 hour(s))  Respiratory Panel by RT PCR (Flu A&B, Covid) - Nasopharyngeal Swab     Status: None   Collection Time: 06/26/20  7:06 PM   Specimen: Nasopharyngeal Swab  Result Value Ref Range Status   SARS Coronavirus 2 by RT PCR NEGATIVE NEGATIVE Final    Comment: (NOTE) SARS-CoV-2 target nucleic acids are NOT DETECTED.  The SARS-CoV-2 RNA is generally detectable in upper respiratoy specimens during the acute phase of infection. The lowest concentration of SARS-CoV-2 viral copies this assay can detect is 131 copies/mL. A negative result does not preclude SARS-Cov-2 infection and should not be used as the sole basis for treatment or other patient management decisions. A negative result may occur with  improper specimen collection/handling, submission of specimen other than nasopharyngeal swab, presence of viral mutation(s) within the areas targeted by this assay, and inadequate number of viral copies (<131 copies/mL). A negative result must be combined with clinical observations, patient history, and epidemiological information. The expected result is  Negative.  Fact Sheet for Patients:  https://www.moore.com/  Fact Sheet for Healthcare Providers:  https://www.young.biz/  This test is no t yet approved or cleared by the [place] FDA and  has been authorized for detection and/or diagnosis of SARS-CoV-2 by FDA under an Emergency Use Authorization (EUA). This EUA will remain  in effect (meaning this test can be used) for the duration of the COVID-19 declaration under Section 564(b)(1) of the Act, 21 U.S.C. section 360bbb-3(b)(1), unless the authorization is terminated or revoked sooner.     Influenza A by PCR NEGATIVE NEGATIVE Final   Influenza B by PCR NEGATIVE NEGATIVE Final    Comment: (NOTE) The Xpert  Xpress SARS-CoV-2/FLU/RSV assay is intended as an aid in  the diagnosis of influenza from Nasopharyngeal swab specimens and  should not be used as a sole basis for treatment. Nasal washings and  aspirates are unacceptable for Xpert Xpress SARS-CoV-2/FLU/RSV  testing.  Fact Sheet for Patients: https://www.moore.com/  Fact Sheet for Healthcare Providers: https://www.young.biz/  This test is not yet approved or cleared by the [place] FDA and  has been authorized for detection and/or diagnosis of SARS-CoV-2 by  FDA under an Emergency Use Authorization (EUA). This EUA will remain  in effect (meaning this test can be used) for the duration of the  Covid-19 declaration under Section 564(b)(1) of the Act, 21  U.S.C. section 360bbb-3(b)(1), unless the authorization is  terminated or revoked. Performed at [facility], [address], [place], [place] [address]   Culture, Urine     Status: None   Collection Time: 06/27/20  7:53 AM   Specimen: Urine, Clean Catch  Result Value Ref Range Status   Specimen Description   Final    URINE, CLEAN CATCH Performed at [facility], [address] [place], [place] [address]    Special Requests   Final    NONE Performed at [facility], [address], [place], [place] [address]    Culture   Final    NO GROWTH Performed at [facility], [address] [place], [place] [address]    Report Status 06/28/2020 FINAL  Final     Labs: Basic Metabolic Panel: Recent Labs  Lab 06/26/20 1945 06/27/20 0352 06/28/20 2423 06/29/20 0520 06/30/20 0529 07/01/20 0408 07/02/20 0328  NA  --    < > 133* 134* 134* 136 136  K  --    < > 3.7 3.1* 3.0* 3.8 4.9  CL  --    < > 98 97* 95* 96* 98  CO2  --    < > 24 25 26 30 28   GLUCOSE  --    < > 176* 153* 176* 170* 160*  BUN  --    < > 51* 42* 36* 39* 41*  CREATININE  --    < > 1.58* 1.36* 1.22 1.30* 1.29*  CALCIUM  --    < > 8.1* 7.9* 8.1* 8.3* 8.3*  MG 3.0*   --   --  2.2  --   --  2.1   < > = values in this interval not displayed.   Liver Function Tests: Recent Labs  Lab 06/26/20 1739  AST 40  ALT 44  ALKPHOS 90  BILITOT 0.7  PROT 6.7  ALBUMIN 3.7   No results for input(s): LIPASE, AMYLASE in the last 168 hours. No results for input(s): AMMONIA in the last 168 hours. CBC: Recent Labs  Lab 06/26/20 1739 06/28/20 0608  WBC 8.8 8.4  NEUTROABS 7.1  --   HGB 11.3* 10.0*  HCT 35.2* 31.4*  MCV 94.4 95.7  PLT 285 216   Cardiac Enzymes: No results for input(s): CKTOTAL, CKMB, CKMBINDEX, TROPONINI in the last 168 hours. BNP: BNP (last 3 results) Recent Labs    06/26/20 1739  BNP 3,447.0*    ProBNP (last 3 results) No results for input(s): PROBNP in the last 8760 hours.  CBG: Recent Labs  Lab 07/01/20 0756 07/01/20 1133 07/01/20 1648 07/01/20 2031 07/02/20 0723  GLUCAP 160* 202* 214* 198* 152*       Signed:  Vassie Loll MD.  [facility] 07/02/2020, 10:46 AM

## 2020-07-02 NOTE — Progress Notes (Addendum)
Progress Note  Patient Name: Franklin Gonzalez Date of Encounter: 07/02/2020  Primary Cardiologist: Olga Millers, MD   Subjective   No chest pain or palpitations. Breathing improved. Still having a mild cough.   Inpatient Medications    Scheduled Meds: . amiodarone  100 mg Oral Daily  . aspirin EC  81 mg Oral Daily  . atorvastatin  10 mg Oral QHS  . enoxaparin (LOVENOX) injection  40 mg Subcutaneous Q24H  . insulin aspart  0-5 Units Subcutaneous QHS  . insulin aspart  0-9 Units Subcutaneous TID WC  . losartan  12.5 mg Oral Daily  . polyethylene glycol  17 g Oral Daily  . potassium chloride  40 mEq Oral BID  . senna  1 tablet Oral Daily  . spironolactone  12.5 mg Oral Daily  . torsemide  40 mg Oral Daily   Continuous Infusions:  PRN Meds: acetaminophen **OR** acetaminophen, ondansetron **OR** ondansetron (ZOFRAN) IV   Vital Signs    Vitals:   07/01/20 0900 07/01/20 1453 07/01/20 2029 07/02/20 0541  BP: 132/82 110/71 111/73 122/67  Pulse: 82 84 82 81  Resp: 20 20 20 18   Temp: 98.6 F (37 C) 97.9 F (36.6 C) 99 F (37.2 C) 97.9 F (36.6 C)  TempSrc: Oral Oral Oral Oral  SpO2: 99% 95% 97% 94%  Weight:    70.8 kg  Height:        Intake/Output Summary (Last 24 hours) at 07/02/2020 0952 Last data filed at 07/01/2020 2227 Gross per 24 hour  Intake 250 ml  Output 1601 ml  Net -1351 ml    Last 3 Weights 07/02/2020 07/01/2020 06/30/2020  Weight (lbs) 156 lb 1.4 oz 156 lb 1.4 oz 155 lb 10.3 oz  Weight (kg) 70.8 kg 70.8 kg 70.6 kg      Telemetry    NSR, HR in 70's to 80's. Occasional PVC's.   - Personally Reviewed  ECG    No new tracings.   Physical Exam   General: Well developed elderly male appearing in no acute distress. Head: Normocephalic, atraumatic.  Neck: Supple without bruits, JVD not elevated. Lungs:  Resp regular and unlabored, CTA without wheezing or rales. Heart: RRR, S1, S2, no S3, S4, or murmur; no rub. Abdomen: Soft, non-tender,  non-distended with normoactive bowel sounds. No hepatomegaly. No rebound/guarding. No obvious abdominal masses. Extremities: No clubbing or cyanosis, trace edema. Distal pedal pulses are 2+ bilaterally. Neuro: Alert and oriented X 3. Moves all extremities spontaneously. Psych: Normal affect.  Labs    Chemistry Recent Labs  Lab 06/26/20 1739 06/27/20 0352 06/30/20 0529 07/01/20 0408 07/02/20 0328  NA 131*   < > 134* 136 136  K 5.2*   < > 3.0* 3.8 4.9  CL 99   < > 95* 96* 98  CO2 21*   < > 26 30 28   GLUCOSE 226*   < > 176* 170* 160*  BUN 53*   < > 36* 39* 41*  CREATININE 1.56*   < > 1.22 1.30* 1.29*  CALCIUM 8.9   < > 8.1* 8.3* 8.3*  PROT 6.7  --   --   --   --   ALBUMIN 3.7  --   --   --   --   AST 40  --   --   --   --   ALT 44  --   --   --   --   ALKPHOS 90  --   --   --   --  BILITOT 0.7  --   --   --   --   GFRNONAA 40*   < > 54* 50* 51*  ANIONGAP 11   < > 13 10 10    < > = values in this interval not displayed.     Hematology Recent Labs  Lab 06/26/20 1739 06/28/20 0608  WBC 8.8 8.4  RBC 3.73* 3.28*  HGB 11.3* 10.0*  HCT 35.2* 31.4*  MCV 94.4 95.7  MCH 30.3 30.5  MCHC 32.1 31.8  RDW 13.6 13.6  PLT 285 216    Cardiac EnzymesNo results for input(s): TROPONINI in the last 168 hours. No results for input(s): TROPIPOC in the last 168 hours.   BNP Recent Labs  Lab 06/26/20 1739  BNP 3,447.0*     DDimer No results for input(s): DDIMER in the last 168 hours.   Radiology    No results found.  Cardiac Studies   Echocardiogram: 06/27/2020 IMPRESSIONS    1. Left ventricular ejection fraction, by estimation, is 25%. The left  ventricle has severely decreased function. The left ventricle demonstrates  global hypokinesis. The left ventricular internal cavity size was mildly  dilated. There is mild left  ventricular hypertrophy. Left ventricular diastolic parameters are  consistent with Grade II diastolic dysfunction (pseudonormalization).  Elevated  left atrial pressure.  2. Right ventricular systolic function is mildly reduced. The right  ventricular size is normal.  3. Left atrial size was severely dilated.  4. Right atrial size was mildly dilated.  5. The mitral valve is normal in structure. Mild mitral valve  regurgitation. No evidence of mitral stenosis.  6. The aortic valve is tricuspid. There is moderate calcification of the  aortic valve. There is moderate thickening of the aortic valve. Aortic  valve regurgitation is not visualized. No aortic stenosis is present.  7. The inferior vena cava is dilated in size with >50% respiratory  variability, suggesting right atrial pressure of 8 mmHg.   Patient Profile     86 y.o. male w/ PMH of CAD (s/p CABG in 1994, NSTEMI in 2014 with DES to SVG-OM2 and SVG-PDA), HTN, HLD, Type 2 DM, SVT and anemia who is currently admitted for an acute CHF exacerbation. Found to have a new cardiomyopathy with EF at 25%.   Assessment & Plan    1. Acute Systolic CHF Exacerbation - Presented with worsening dyspnea, weakness, lower extremity edema and abdominal distension, found to have an acute CHF exacerbation with BNP elevated to 3447. Echo shows a reduced EF of 25% with Grade 2 DD and mild RV dysfunction as outlined above. Medical therapy has been pursued in the setting of his advanced age.   - He was initially receiving IV Lasix and has now been transitioned to Torsemide 40mg  daily. Creatinine stable at 1.29.Recorded output of -9.3L and weight has declined from 171 lbs to 156 lbs.  -  Also on Losartan 12.5mg  daily and Spironolactone 12.5mg  daily. Not started on a BB given trifascicular block by EKG.   2. CAD/Elevated Troponin  - He is s/p CABG in 1994 with NSTEMI in 2014 with DES to SVG-OM2 and SVG-PDA HS. Troponin values were elevated to 648 and 571 this admission, felt to be most consistent with demand ischemia in the setting of acute CHF and not consistent with ACS.  - Remains on ASA and statin  therapy.   3. HLD - Continue PTA Atorvastatin 10mg  daily. LDL was at 74 in 12/2019.  4. SVT - He has maintained NSR this  admission. Remains on Amiodarone 100mg  daily.   5. Stage 3 CKD - Baseline creatinine 1.2 - 1.3. Peaked at 1.58 this admission, improved to 1.29 today. Will need a repeat BMET at outpatient follow-up.   6. Hyponatremia - Resolved. Na+ stable at 136 today.    For questions or updates, please contact [facility] Please consult [email or web address] for contact info under Cardiology/STEMI.   , PA-C 9:52 AM 07/02/2020 Pager: [phone]  Patient seen and discussed with PA [phone], I agree with her documentation. Admitted with acute systolic HF, new drop in LVEF to 25%. After discussion with patient, family and his primary cardiologist Dr Iran Ouch for now treat medically and have not elected for cath due to advanced age. 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  Significant diuresis this admission, by I/Os 9.3L. Yesterday we changed him to torsemide 40mg  daily. Cr overall stable on oral regimen. Will need close follow and repeat labs to reassess diuretic dosing.  Cough this morning, defer to primary team  We will sign off inpatient care and arrange outpatient f/u and labs 1-2 weeks, needs BMET/Mg    Jens Som MD

## 2020-07-03 DIAGNOSIS — I2510 Atherosclerotic heart disease of native coronary artery without angina pectoris: Secondary | ICD-10-CM | POA: Diagnosis not present

## 2020-07-03 DIAGNOSIS — E871 Hypo-osmolality and hyponatremia: Secondary | ICD-10-CM | POA: Diagnosis not present

## 2020-07-03 DIAGNOSIS — Z9181 History of falling: Secondary | ICD-10-CM | POA: Diagnosis not present

## 2020-07-03 DIAGNOSIS — S92311D Displaced fracture of first metatarsal bone, right foot, subsequent encounter for fracture with routine healing: Secondary | ICD-10-CM | POA: Diagnosis not present

## 2020-07-03 DIAGNOSIS — E1165 Type 2 diabetes mellitus with hyperglycemia: Secondary | ICD-10-CM | POA: Diagnosis not present

## 2020-07-03 DIAGNOSIS — I252 Old myocardial infarction: Secondary | ICD-10-CM | POA: Diagnosis not present

## 2020-07-03 DIAGNOSIS — I5043 Acute on chronic combined systolic (congestive) and diastolic (congestive) heart failure: Secondary | ICD-10-CM | POA: Diagnosis not present

## 2020-07-03 DIAGNOSIS — Z7984 Long term (current) use of oral hypoglycemic drugs: Secondary | ICD-10-CM | POA: Diagnosis not present

## 2020-07-03 DIAGNOSIS — E785 Hyperlipidemia, unspecified: Secondary | ICD-10-CM | POA: Diagnosis not present

## 2020-07-03 DIAGNOSIS — J9601 Acute respiratory failure with hypoxia: Secondary | ICD-10-CM | POA: Diagnosis not present

## 2020-07-03 DIAGNOSIS — W19XXXD Unspecified fall, subsequent encounter: Secondary | ICD-10-CM | POA: Diagnosis not present

## 2020-07-03 DIAGNOSIS — I453 Trifascicular block: Secondary | ICD-10-CM | POA: Diagnosis not present

## 2020-07-03 DIAGNOSIS — I471 Supraventricular tachycardia: Secondary | ICD-10-CM | POA: Diagnosis not present

## 2020-07-03 DIAGNOSIS — I255 Ischemic cardiomyopathy: Secondary | ICD-10-CM | POA: Diagnosis not present

## 2020-07-03 DIAGNOSIS — Z951 Presence of aortocoronary bypass graft: Secondary | ICD-10-CM | POA: Diagnosis not present

## 2020-07-03 DIAGNOSIS — N1832 Chronic kidney disease, stage 3b: Secondary | ICD-10-CM | POA: Diagnosis not present

## 2020-07-03 DIAGNOSIS — E1122 Type 2 diabetes mellitus with diabetic chronic kidney disease: Secondary | ICD-10-CM | POA: Diagnosis not present

## 2020-07-03 DIAGNOSIS — I5023 Acute on chronic systolic (congestive) heart failure: Secondary | ICD-10-CM | POA: Diagnosis not present

## 2020-07-03 DIAGNOSIS — Z7982 Long term (current) use of aspirin: Secondary | ICD-10-CM | POA: Diagnosis not present

## 2020-07-03 DIAGNOSIS — I130 Hypertensive heart and chronic kidney disease with heart failure and stage 1 through stage 4 chronic kidney disease, or unspecified chronic kidney disease: Secondary | ICD-10-CM | POA: Diagnosis not present

## 2020-07-04 DIAGNOSIS — N1832 Chronic kidney disease, stage 3b: Secondary | ICD-10-CM | POA: Diagnosis not present

## 2020-07-04 DIAGNOSIS — I5043 Acute on chronic combined systolic (congestive) and diastolic (congestive) heart failure: Secondary | ICD-10-CM | POA: Diagnosis not present

## 2020-07-04 DIAGNOSIS — E1122 Type 2 diabetes mellitus with diabetic chronic kidney disease: Secondary | ICD-10-CM | POA: Diagnosis not present

## 2020-07-04 DIAGNOSIS — I130 Hypertensive heart and chronic kidney disease with heart failure and stage 1 through stage 4 chronic kidney disease, or unspecified chronic kidney disease: Secondary | ICD-10-CM | POA: Diagnosis not present

## 2020-07-04 DIAGNOSIS — J9601 Acute respiratory failure with hypoxia: Secondary | ICD-10-CM | POA: Diagnosis not present

## 2020-07-04 DIAGNOSIS — E1165 Type 2 diabetes mellitus with hyperglycemia: Secondary | ICD-10-CM | POA: Diagnosis not present

## 2020-07-05 DIAGNOSIS — R0989 Other specified symptoms and signs involving the circulatory and respiratory systems: Secondary | ICD-10-CM | POA: Diagnosis not present

## 2020-07-14 DIAGNOSIS — E1165 Type 2 diabetes mellitus with hyperglycemia: Secondary | ICD-10-CM | POA: Diagnosis not present

## 2020-07-14 DIAGNOSIS — I5043 Acute on chronic combined systolic (congestive) and diastolic (congestive) heart failure: Secondary | ICD-10-CM | POA: Diagnosis not present

## 2020-07-14 DIAGNOSIS — J9601 Acute respiratory failure with hypoxia: Secondary | ICD-10-CM | POA: Diagnosis not present

## 2020-07-14 DIAGNOSIS — N1832 Chronic kidney disease, stage 3b: Secondary | ICD-10-CM | POA: Diagnosis not present

## 2020-07-14 DIAGNOSIS — E1122 Type 2 diabetes mellitus with diabetic chronic kidney disease: Secondary | ICD-10-CM | POA: Diagnosis not present

## 2020-07-14 DIAGNOSIS — I130 Hypertensive heart and chronic kidney disease with heart failure and stage 1 through stage 4 chronic kidney disease, or unspecified chronic kidney disease: Secondary | ICD-10-CM | POA: Diagnosis not present

## 2020-07-15 ENCOUNTER — Other Ambulatory Visit: Payer: Self-pay

## 2020-07-15 ENCOUNTER — Ambulatory Visit: Payer: Medicare Other | Admitting: Cardiology

## 2020-07-15 ENCOUNTER — Ambulatory Visit (INDEPENDENT_AMBULATORY_CARE_PROVIDER_SITE_OTHER): Payer: Medicare Other | Admitting: Cardiology

## 2020-07-15 ENCOUNTER — Encounter: Payer: Self-pay | Admitting: Cardiology

## 2020-07-15 DIAGNOSIS — I2510 Atherosclerotic heart disease of native coronary artery without angina pectoris: Secondary | ICD-10-CM | POA: Diagnosis not present

## 2020-07-15 DIAGNOSIS — I5021 Acute systolic (congestive) heart failure: Secondary | ICD-10-CM

## 2020-07-15 DIAGNOSIS — N1832 Chronic kidney disease, stage 3b: Secondary | ICD-10-CM

## 2020-07-15 DIAGNOSIS — E039 Hypothyroidism, unspecified: Secondary | ICD-10-CM | POA: Diagnosis not present

## 2020-07-15 DIAGNOSIS — I2583 Coronary atherosclerosis due to lipid rich plaque: Secondary | ICD-10-CM

## 2020-07-15 DIAGNOSIS — Z6822 Body mass index (BMI) 22.0-22.9, adult: Secondary | ICD-10-CM | POA: Diagnosis not present

## 2020-07-15 DIAGNOSIS — I509 Heart failure, unspecified: Secondary | ICD-10-CM | POA: Diagnosis not present

## 2020-07-15 DIAGNOSIS — N189 Chronic kidney disease, unspecified: Secondary | ICD-10-CM | POA: Diagnosis not present

## 2020-07-15 DIAGNOSIS — E1140 Type 2 diabetes mellitus with diabetic neuropathy, unspecified: Secondary | ICD-10-CM | POA: Diagnosis not present

## 2020-07-15 NOTE — Assessment & Plan Note (Signed)
GFR 40's 

## 2020-07-15 NOTE — Assessment & Plan Note (Signed)
New drop in EF to 25% from 45%- medical Rx

## 2020-07-15 NOTE — Patient Instructions (Signed)
Medication Instructions:  Your physician recommends that you continue on your current medications as directed. Please refer to the Current Medication list given to you today.  *If you need a refill on your cardiac medications before your next appointment, please call your pharmacy*   Lab Work: I will request labs from your PCP.  If you have labs (blood work) drawn today and your tests are completely normal, you will receive your results only by:  MyChart Message (if you have MyChart) OR  A paper copy in the mail If you have any lab test that is abnormal or we need to change your treatment, we will call you to review the results.   Testing/Procedures: NONE   Follow-Up: At [facility], you and your health needs are our priority.  As part of our continuing mission to provide you with exceptional heart care, we have created designated Provider Care Teams.  These Care Teams include your primary Cardiologist (physician) and Advanced Practice Providers (APPs -  Physician Assistants and Nurse Practitioners) who all work together to provide you with the care you need, when you need it.  We recommend signing up for the patient portal called "MyChart".  Sign up information is provided on this After Visit Summary.  MyChart is used to connect with patients for Virtual Visits (Telemedicine).  Patients are able to view lab/test results, encounter notes, upcoming appointments, etc.  Non-urgent messages can be sent to your provider as well.   To learn more about what you can do with MyChart, go to [email or web address].    Your next appointment:    As Planned   The format for your next appointment:   In Person  Provider:   Dr. Jens Som    Other Instructions Thank you for choosing [facility]!

## 2020-07-15 NOTE — Progress Notes (Signed)
Cardiology Office Note:    Date:  07/15/2020   ID:  Franklin Gonzalez, DOB 11-24-1921, MRN [id number]  PCP:  Selinda FlavinHoward, [name], MD  Cardiologist:  Olga MillersBrian Crenshaw, MD  Electrophysiologist:  None   Referring MD: Selinda FlavinHoward, [name], MD   CC: post hospital follow up  History of Present Illness:    Franklin Gonzalez is a 86 y.o. male with a hx of [facility]G in [facility], NSTEMI 2014 s/p DES SVG-OM2 & [facility]PDA [facility], NIDDM, HTN, PSVT-on low dose Amiodarone, and anemia, who was admitted with CHF 06/27/2020.    The patient  fell and broke his leg on 10/8, this hurt his ability to ambulate.  Prior to that, he had been doing his ADLs and small tasks around the house.  He was not having problems with chest pain or shortness of breath.  Shortly after that ER visit, he developed shortness of breath and cough and saw his PCP.  He was put on antibiotics for possible infection.  He developed nausea, vomiting and diarrhea, but the symptoms resolved. Even after completing antibiotics, his shortness of breath continued and he noticed lower extremity edema and abdominal swelling as well.    As his symptoms progressed, he developed orthopnea and PND.  He presented to the ED 06/27/2020 and was found to be in acute CHF. His weight had gone from 150 lbs to 170 lbs.  He was treated with IV Lasix and diuresed 9L.  His echo showed new LVD -25%.  Plan is for medical Rx.   He presents to to the office today for follow up. Since discharge his weight has been stable- 149 lbs.  The patient denies increased SOB. His PCP drew labs today.    Past Medical History:  Diagnosis Date  . Bifascicular block   . Coronary artery disease    [facility]AB[facility]; NSTEMI 09/2012 s/p DES to SVG-OM1, DES to SVG-PDA   . DM (diabetes mellitus) (HCC)   . HTN (hypertension)   . SVT (supraventricular tachycardia) (HCC)     Past Surgical History:  Procedure Laterality Date  . CARDIAC SURGERY     1993 CABG  . LEFT HEART CATHETERIZATION  WITH CORONARY/GRAFT ANGIOGRAM N/A 10/04/2012   Procedure: LEFT HEART CATHETERIZATION WITH Isabel CapriceORONARY/GRAFT ANGIOGRAM;  Surgeon: Tonny BollmanMichael Cooper, MD;  [facility];  Service: Cardiovascular;  Laterality: N/A;    Current Medications: Current Meds  Medication Sig  . amiodarone (PACERONE) 200 MG tablet Take 1/2 (one-half) tablet by mouth once daily  . amLODipine (NORVASC) 5 MG tablet Take 5 mg by mouth daily.  Marland Kitchen. aspirin EC 81 MG EC tablet Take 1 tablet (81 mg total) by mouth daily.  Marland Kitchen. atorvastatin (LIPITOR) 10 MG tablet Take 10 mg by mouth at bedtime.  Marland Kitchen. doxazosin (CARDURA) 4 MG tablet Take 4 mg by mouth at bedtime.  Marland Kitchen. levothyroxine (SYNTHROID) 25 MCG tablet Take 1 tablet (25 mcg total) by mouth daily before breakfast.  . losartan (COZAAR) 25 MG tablet Take 0.5 tablets (12.5 mg total) by mouth daily.  . metFORMIN (GLUCOPHAGE-XR) 500 MG 24 hr tablet Take 1 tablet (500 mg total) by mouth daily.  . nitroGLYCERIN (NITROSTAT) 0.4 MG SL tablet Place 1 tablet (0.4 mg total) under the tongue every 5 (five) minutes x 3 doses as needed for chest pain.  . potassium chloride SA (KLOR-CON) 20 MEQ tablet Take 1 tablet (20 mEq total) by mouth daily.  Marland Kitchen. spironolactone (ALDACTONE) 25 MG tablet Take 0.5 tablets (12.5 mg total)  by mouth daily.  Marland Kitchen torsemide (DEMADEX) 20 MG tablet Take 2 tablets (40 mg total) by mouth daily.     Allergies:   Patient has no known allergies.   Social History   Socioeconomic History  . Marital status: Married    Spouse name: Not on file  . Number of children: Not on file  . Years of education: Not on file  . Highest education level: Not on file  Occupational History  . Not on file  Tobacco Use  . Smoking status: Never Smoker  . Smokeless tobacco: Never Used  Vaping Use  . Vaping Use: Never used  Substance and Sexual Activity  . Alcohol use: No    Alcohol/week: 0.0 standard drinks  . Drug use: No  . Sexual activity: Not on file  Other Topics Concern  . Not on  file  Social History Narrative  . Not on file   Social Determinants of Health   Financial Resource Strain:   . Difficulty of Paying Living Expenses: Not on file  Food Insecurity:   . Worried About Programme researcher, broadcasting/film/video in the Last Year: Not on file  . Ran Out of Food in the Last Year: Not on file  Transportation Needs:   . Lack of Transportation (Medical): Not on file  . Lack of Transportation (Non-Medical): Not on file  Physical Activity:   . Days of Exercise per Week: Not on file  . Minutes of Exercise per Session: Not on file  Stress:   . Feeling of Stress : Not on file  Social Connections:   . Frequency of Communication with Friends and Family: Not on file  . Frequency of Social Gatherings with Friends and Family: Not on file  . Attends Religious Services: Not on file  . Active Member of Clubs or Organizations: Not on file  . Attends Banker Meetings: Not on file  . Marital Status: Not on file     Family History: The patient's family history includes CAD in an other family member.  ROS:   Please see the history of present illness.     All other systems reviewed and are negative.  EKGs/Labs/Other Studies Reviewed:    The following studies were reviewed today:  Echo 06/27/2020- IMPRESSIONS    1. Left ventricular ejection fraction, by estimation, is 25%. The left  ventricle has severely decreased function. The left ventricle demonstrates  global hypokinesis. The left ventricular internal cavity size was mildly  dilated. There is mild left  ventricular hypertrophy. Left ventricular diastolic parameters are  consistent with Grade II diastolic dysfunction (pseudonormalization).  Elevated left atrial pressure.  2. Right ventricular systolic function is mildly reduced. The right  ventricular size is normal.  3. Left atrial size was severely dilated.  4. Right atrial size was mildly dilated.  5. The mitral valve is normal in structure. Mild mitral  valve  regurgitation. No evidence of mitral stenosis.  6. The aortic valve is tricuspid. There is moderate calcification of the  aortic valve. There is moderate thickening of the aortic valve. Aortic  valve regurgitation is not visualized. No aortic stenosis is present.  7. The inferior vena cava is dilated in size with >50% respiratory  variability, suggesting right atrial pressure of 8 mmHg.   EKG:  EKG is not ordered today.  The ekg ordered 06/27/2020 demonstrates NSR, RBBB, LAFB  Recent Labs: 06/26/2020: ALT 44; B Natriuretic Peptide 3,447.0 06/28/2020: Hemoglobin 10.0; Platelets 216 07/02/2020: BUN 41; Creatinine, Ser 1.29;  Magnesium 2.1; Potassium 4.9; Sodium 136  Recent Lipid Panel    Component Value Date/Time   CHOL 132 04/14/2018 0844   TRIG 68 04/14/2018 0844   HDL 66 04/14/2018 0844   CHOLHDL 2.0 04/14/2018 0844   CHOLHDL 2.2 02/14/2015 0905   VLDL 7 02/14/2015 0905   LDLCALC 52 04/14/2018 0844    Physical Exam:    VS:  BP (!) 104/58   Pulse 65   Ht 5\' 11"  (1.803 m)   Wt 149 lb 3.2 oz (67.7 kg)   SpO2 99%   BMI 20.81 kg/m     Wt Readings from Last 3 Encounters:  07/15/20 149 lb 3.2 oz (67.7 kg)  07/02/20 156 lb 1.4 oz (70.8 kg)  12/17/19 160 lb 3.2 oz (72.7 kg)     GEN: Elderly Caucasian male in no acute distress HEENT: Normal NECK: No JVD; No carotid bruits CARDIAC: RRR, overall decreased heart sounds no murmurs, rubs, gallops RESPIRATORY:  Clear to auscultation without rales, wheezing or rhonchi  ABDOMEN: Soft, non-tender, non-distended MUSCULOSKELETAL:  trace edema; No deformity  SKIN: Warm and dry NEUROLOGIC:  Alert and oriented x 3 PSYCHIATRIC:  Normal affect   ASSESSMENT:    Acute CHF (congestive heart failure) (HCC) New drop in EF to 25% from 45%- medical Rx  CAD (coronary artery disease) CABG in 1993, PCI in 2014  Chronic renal insufficiency, stage III (moderate) (HCC) GFR 40's  PLAN:    His PCP has ordered labs- adjust diuretics  as indicated.  Stop K+ supplement if he is taking this (family member did not think he was).  Keep f/u with Dr 2015 as scheduled next month.    Medication Adjustments/Labs and Tests Ordered: Current medicines are reviewed at length with the patient today.  Concerns regarding medicines are outlined above.  No orders of the defined types were placed in this encounter.  No orders of the defined types were placed in this encounter.   Patient Instructions  Medication Instructions:  Your physician recommends that you continue on your current medications as directed. Please refer to the Current Medication list given to you today.  *If you need a refill on your cardiac medications before your next appointment, please call your pharmacy*   Lab Work: I will request labs from your PCP.  If you have labs (blood work) drawn today and your tests are completely normal, you will receive your results only by: Jens Som MyChart Message (if you have MyChart) OR . A paper copy in the mail If you have any lab test that is abnormal or we need to change your treatment, we will call you to review the results.   Testing/Procedures: NONE   Follow-Up: At [facility], you and your health needs are our priority.  As part of our continuing mission to provide you with exceptional heart care, we have created designated Provider Care Teams.  These Care Teams include your primary Cardiologist (physician) and Advanced Practice Providers (APPs -  Physician Assistants and Nurse Practitioners) who all work together to provide you with the care you need, when you need it.  We recommend signing up for the patient portal called "MyChart".  Sign up information is provided on this After Visit Summary.  MyChart is used to connect with patients for Virtual Visits (Telemedicine).  Patients are able to view lab/test results, encounter notes, upcoming appointments, etc.  Non-urgent messages can be sent to your provider as well.   To  learn more about what you can do  with MyChart, go to [email or web address].    Your next appointment:    As Planned   The format for your next appointment:   In Person  Provider:   Dr. Jens Som    Other Instructions Thank you for choosing [facility]!       Signed, Corine Shelter, PA-C  07/15/2020 1:01 PM    [facility]

## 2020-07-15 NOTE — Assessment & Plan Note (Signed)
CABG in 1993, PCI in 2014

## 2020-07-16 DIAGNOSIS — N1832 Chronic kidney disease, stage 3b: Secondary | ICD-10-CM | POA: Diagnosis not present

## 2020-07-16 DIAGNOSIS — I130 Hypertensive heart and chronic kidney disease with heart failure and stage 1 through stage 4 chronic kidney disease, or unspecified chronic kidney disease: Secondary | ICD-10-CM | POA: Diagnosis not present

## 2020-07-16 DIAGNOSIS — E1165 Type 2 diabetes mellitus with hyperglycemia: Secondary | ICD-10-CM | POA: Diagnosis not present

## 2020-07-16 DIAGNOSIS — E1122 Type 2 diabetes mellitus with diabetic chronic kidney disease: Secondary | ICD-10-CM | POA: Diagnosis not present

## 2020-07-16 DIAGNOSIS — I5043 Acute on chronic combined systolic (congestive) and diastolic (congestive) heart failure: Secondary | ICD-10-CM | POA: Diagnosis not present

## 2020-07-16 DIAGNOSIS — J9601 Acute respiratory failure with hypoxia: Secondary | ICD-10-CM | POA: Diagnosis not present

## 2020-07-17 DIAGNOSIS — I5043 Acute on chronic combined systolic (congestive) and diastolic (congestive) heart failure: Secondary | ICD-10-CM | POA: Diagnosis not present

## 2020-07-17 DIAGNOSIS — I130 Hypertensive heart and chronic kidney disease with heart failure and stage 1 through stage 4 chronic kidney disease, or unspecified chronic kidney disease: Secondary | ICD-10-CM | POA: Diagnosis not present

## 2020-07-17 DIAGNOSIS — E1122 Type 2 diabetes mellitus with diabetic chronic kidney disease: Secondary | ICD-10-CM | POA: Diagnosis not present

## 2020-07-17 DIAGNOSIS — N1832 Chronic kidney disease, stage 3b: Secondary | ICD-10-CM | POA: Diagnosis not present

## 2020-07-17 DIAGNOSIS — E1165 Type 2 diabetes mellitus with hyperglycemia: Secondary | ICD-10-CM | POA: Diagnosis not present

## 2020-07-17 DIAGNOSIS — J9601 Acute respiratory failure with hypoxia: Secondary | ICD-10-CM | POA: Diagnosis not present

## 2020-07-18 DIAGNOSIS — I130 Hypertensive heart and chronic kidney disease with heart failure and stage 1 through stage 4 chronic kidney disease, or unspecified chronic kidney disease: Secondary | ICD-10-CM | POA: Diagnosis not present

## 2020-07-18 DIAGNOSIS — N1832 Chronic kidney disease, stage 3b: Secondary | ICD-10-CM | POA: Diagnosis not present

## 2020-07-18 DIAGNOSIS — J9601 Acute respiratory failure with hypoxia: Secondary | ICD-10-CM | POA: Diagnosis not present

## 2020-07-18 DIAGNOSIS — E1122 Type 2 diabetes mellitus with diabetic chronic kidney disease: Secondary | ICD-10-CM | POA: Diagnosis not present

## 2020-07-18 DIAGNOSIS — I5043 Acute on chronic combined systolic (congestive) and diastolic (congestive) heart failure: Secondary | ICD-10-CM | POA: Diagnosis not present

## 2020-07-18 DIAGNOSIS — E1165 Type 2 diabetes mellitus with hyperglycemia: Secondary | ICD-10-CM | POA: Diagnosis not present

## 2020-07-19 DIAGNOSIS — I130 Hypertensive heart and chronic kidney disease with heart failure and stage 1 through stage 4 chronic kidney disease, or unspecified chronic kidney disease: Secondary | ICD-10-CM | POA: Diagnosis not present

## 2020-07-19 DIAGNOSIS — E1165 Type 2 diabetes mellitus with hyperglycemia: Secondary | ICD-10-CM | POA: Diagnosis not present

## 2020-07-19 DIAGNOSIS — E1122 Type 2 diabetes mellitus with diabetic chronic kidney disease: Secondary | ICD-10-CM | POA: Diagnosis not present

## 2020-07-19 DIAGNOSIS — I5043 Acute on chronic combined systolic (congestive) and diastolic (congestive) heart failure: Secondary | ICD-10-CM | POA: Diagnosis not present

## 2020-07-19 DIAGNOSIS — J9601 Acute respiratory failure with hypoxia: Secondary | ICD-10-CM | POA: Diagnosis not present

## 2020-07-19 DIAGNOSIS — N1832 Chronic kidney disease, stage 3b: Secondary | ICD-10-CM | POA: Diagnosis not present

## 2020-07-21 DIAGNOSIS — N189 Chronic kidney disease, unspecified: Secondary | ICD-10-CM | POA: Diagnosis not present

## 2020-07-21 DIAGNOSIS — I509 Heart failure, unspecified: Secondary | ICD-10-CM | POA: Diagnosis not present

## 2020-07-21 DIAGNOSIS — Z6822 Body mass index (BMI) 22.0-22.9, adult: Secondary | ICD-10-CM | POA: Diagnosis not present

## 2020-07-22 ENCOUNTER — Other Ambulatory Visit: Payer: Self-pay | Admitting: *Deleted

## 2020-07-22 DIAGNOSIS — I5043 Acute on chronic combined systolic (congestive) and diastolic (congestive) heart failure: Secondary | ICD-10-CM | POA: Diagnosis not present

## 2020-07-22 DIAGNOSIS — I130 Hypertensive heart and chronic kidney disease with heart failure and stage 1 through stage 4 chronic kidney disease, or unspecified chronic kidney disease: Secondary | ICD-10-CM | POA: Diagnosis not present

## 2020-07-22 DIAGNOSIS — N1832 Chronic kidney disease, stage 3b: Secondary | ICD-10-CM | POA: Diagnosis not present

## 2020-07-22 DIAGNOSIS — E1165 Type 2 diabetes mellitus with hyperglycemia: Secondary | ICD-10-CM | POA: Diagnosis not present

## 2020-07-22 DIAGNOSIS — E1122 Type 2 diabetes mellitus with diabetic chronic kidney disease: Secondary | ICD-10-CM | POA: Diagnosis not present

## 2020-07-22 DIAGNOSIS — J9601 Acute respiratory failure with hypoxia: Secondary | ICD-10-CM | POA: Diagnosis not present

## 2020-07-22 NOTE — Patient Outreach (Signed)
[facility]) Care Management  07/22/2020  Eesa Justiss 11-Sep-1921 [id number]  [facility] Telephone Assessment/Screen for MD referral  Referral Date: 07/18/20 Referral Source: [facility] [place]  Referral Reason: Please call Patient's son at [phone] as requested per referral information. (Son's was not mentioned) no further reason listed on referral form Referral was marked as urgent, contact request within 72 hours. PCP is in Network  Is on APL  Insurance:[employer] Medicare and  [place] [facility]) AARP Last [facility] admission 06/26/20-07/02/20 for CHF exacerbation    Outreach attempt # 1 successful to [phone] Patient and his son, [name] is able to verify HIPAA, DOB and address Reviewed and addressed referral to [facility] with patient Consent: [facility] RN CM reviewed [facility] services with patient. Patient gave verbal consent for services [facility] telephonic RN CM.   Mr Sascha Baugher requested [facility] RN CM speak with his son Martin Majestic confirms Mr Knueppel did start having [facility] recently and believes this is what Dr Dimas Aguas wanted for the patient  [name] feels home health services is what the patient needs at this time [name] confirms [facility] RN and PT has arrived to visit the patient today 07/22/20 [name] reports he can be updated or reach if need on his mobile number of [phone]   Mr Brosnahan lives alone, Lives on the main level of his home Mr Berlinger wants to get stronger    DME walker 2 wheels  cane single point eyeglasses toilet seat riser glucometer scales Past Medical History:  Diagnosis Date  . Bifascicular block   . Coronary artery disease    CABG 1993 at [facility]; NSTEMI 09/2012 s/p DES to SVG-OM1, DES to SVG-PDA   . DM (diabetes mellitus) (HCC)   . HTN (hypertension)   . SVT (supraventricular tachycardia) (HCC)     Plan: [facility] RN CM will follow up with Mr Seats  and his son within the next 7-14 business days Pt encouraged to return a call to [facility] RN CM prn   Cala Bradford L. [name], RN, BSN, CCM [facility] Telephonic Care Management Care Coordinator Office number [phone] Mobile number [phone]  Main [facility] number [phone] Fax number [phone]

## 2020-07-23 DIAGNOSIS — I130 Hypertensive heart and chronic kidney disease with heart failure and stage 1 through stage 4 chronic kidney disease, or unspecified chronic kidney disease: Secondary | ICD-10-CM | POA: Diagnosis not present

## 2020-07-23 DIAGNOSIS — I5043 Acute on chronic combined systolic (congestive) and diastolic (congestive) heart failure: Secondary | ICD-10-CM | POA: Diagnosis not present

## 2020-07-23 DIAGNOSIS — E1165 Type 2 diabetes mellitus with hyperglycemia: Secondary | ICD-10-CM | POA: Diagnosis not present

## 2020-07-23 DIAGNOSIS — E1122 Type 2 diabetes mellitus with diabetic chronic kidney disease: Secondary | ICD-10-CM | POA: Diagnosis not present

## 2020-07-23 DIAGNOSIS — J9601 Acute respiratory failure with hypoxia: Secondary | ICD-10-CM | POA: Diagnosis not present

## 2020-07-23 DIAGNOSIS — N1832 Chronic kidney disease, stage 3b: Secondary | ICD-10-CM | POA: Diagnosis not present

## 2020-07-24 ENCOUNTER — Ambulatory Visit: Payer: Medicare Other

## 2020-07-24 ENCOUNTER — Other Ambulatory Visit: Payer: Self-pay | Admitting: *Deleted

## 2020-07-24 ENCOUNTER — Encounter: Payer: Self-pay | Admitting: Orthopedic Surgery

## 2020-07-24 ENCOUNTER — Ambulatory Visit (INDEPENDENT_AMBULATORY_CARE_PROVIDER_SITE_OTHER): Payer: Medicare Other | Admitting: Orthopedic Surgery

## 2020-07-24 ENCOUNTER — Other Ambulatory Visit: Payer: Self-pay

## 2020-07-24 VITALS — BP 117/62 | HR 68 | Ht 71.0 in | Wt 149.0 lb

## 2020-07-24 DIAGNOSIS — I2583 Coronary atherosclerosis due to lipid rich plaque: Secondary | ICD-10-CM

## 2020-07-24 DIAGNOSIS — E7800 Pure hypercholesterolemia, unspecified: Secondary | ICD-10-CM | POA: Insufficient documentation

## 2020-07-24 DIAGNOSIS — I2510 Atherosclerotic heart disease of native coronary artery without angina pectoris: Secondary | ICD-10-CM

## 2020-07-24 DIAGNOSIS — J9601 Acute respiratory failure with hypoxia: Secondary | ICD-10-CM | POA: Diagnosis not present

## 2020-07-24 DIAGNOSIS — M79671 Pain in right foot: Secondary | ICD-10-CM

## 2020-07-24 DIAGNOSIS — I130 Hypertensive heart and chronic kidney disease with heart failure and stage 1 through stage 4 chronic kidney disease, or unspecified chronic kidney disease: Secondary | ICD-10-CM | POA: Diagnosis not present

## 2020-07-24 DIAGNOSIS — I5043 Acute on chronic combined systolic (congestive) and diastolic (congestive) heart failure: Secondary | ICD-10-CM | POA: Diagnosis not present

## 2020-07-24 DIAGNOSIS — I10 Essential (primary) hypertension: Secondary | ICD-10-CM | POA: Insufficient documentation

## 2020-07-24 DIAGNOSIS — E1165 Type 2 diabetes mellitus with hyperglycemia: Secondary | ICD-10-CM | POA: Diagnosis not present

## 2020-07-24 DIAGNOSIS — E119 Type 2 diabetes mellitus without complications: Secondary | ICD-10-CM | POA: Insufficient documentation

## 2020-07-24 DIAGNOSIS — R600 Localized edema: Secondary | ICD-10-CM | POA: Diagnosis not present

## 2020-07-24 DIAGNOSIS — E1122 Type 2 diabetes mellitus with diabetic chronic kidney disease: Secondary | ICD-10-CM | POA: Diagnosis not present

## 2020-07-24 DIAGNOSIS — N1832 Chronic kidney disease, stage 3b: Secondary | ICD-10-CM | POA: Diagnosis not present

## 2020-07-24 NOTE — Progress Notes (Signed)
NEW PROBLEM//OFFICE VISIT  Chief complaint fracture first metatarsal right foot  HPI 86 year old male injured his right foot and was admitted to [facility] initial x-rays were taken at the emergency department at [facility] he had a nondisplaced fracture of the first metatarsal he was placed in a surgical shoe and comes in for follow-up as a new patient  ROS Shortness of breath distal edema  Past Medical History:  Diagnosis Date  . Bifascicular block   . Coronary artery disease    CABG 1993 at [facility]; NSTEMI 09/2012 s/p DES to SVG-OM1, DES to SVG-PDA   . DM (diabetes mellitus) (HCC)   . HTN (hypertension)   . SVT (supraventricular tachycardia) (HCC)     Past Surgical History:  Procedure Laterality Date  . CARDIAC SURGERY     1993 CABG  . LEFT HEART CATHETERIZATION WITH CORONARY/GRAFT ANGIOGRAM N/A 10/04/2012   Procedure: LEFT HEART CATHETERIZATION WITH Isabel Caprice;  Surgeon: Tonny Bollman, MD;  Location: [facility] C[facility] LAB;  Service: Cardiovascular;  Laterality: N/A;    Family History  Problem Relation Age of Onset  . CAD Other        multiple relatives including father and son   Social History   Tobacco Use  . Smoking status: Never Smoker  . Smokeless tobacco: Never Used  Vaping Use  . Vaping Use: Never used  Substance Use Topics  . Alcohol use: No    Alcohol/week: 0.0 standard drinks  . Drug use: No    No Known Allergies  No outpatient medications have been marked as taking for the 07/24/20 encounter (Office Visit) with Vickki Hearing, MD.    BP 117/62   Pulse 68   Ht 5\' 11"  (1.803 m)   Wt 149 lb (67.6 kg)   BMI 20.78 kg/m   Physical Exam 86 year old male awake and alert oriented to person place and time mood and affect normal Ortho Exam Ambulates with a walker Right foot nontender first metatarsal.  Patient has intact sensation throughout his foot does have some edema at the ankle into the foot ankle range of motion  normal no atrophy is noted in the foot    MEDICAL DECISION MAKING  A.  Encounter Diagnosis  Name Primary?  . Pain in right foot Yes    B. DATA ANALYSED:   IMAGING: Interpretation of images: Internal images show osteopenia of the right foot with a healed first metatarsal fracture there are questionable fractures of the second and fifth metatarsal which also appear to be healed   C. MANAGEMENT   Resume normal shoewear  Compression stockings for edema control follow-up as needed  No orders of the defined types were placed in this encounter.     94, MD  07/24/2020 9:15 AM

## 2020-07-24 NOTE — Patient Outreach (Signed)
[facility]) Care Management  07/24/2020  Cleve Paolillo [birth year] [id number]   [facility] Telephone Assessment/Screen for MD referral  Mr Eldo Umanzor was referred to [facility] on 07/18/20 Referral Source: MD referral from Selinda Flavin, [facility] [place]  Referral Reason: Please call Patient's son at [phone] as requested per referral information. (Son's was not mentioned) no further reason listed on referral form Referral was marked as urgent, contact request within 72 hours. PCP is in Network  Is on APL  Insurance:NextGen Medicare and  [place] [facility] care [facility]) AARP Last cone admission 06/26/20-07/02/20 for CHF exacerbation    Outreach attempt # 1 successful to [phone] son, [name] is able to verify HIPAA, identifiers Reviewed and addressed with Mr Pindell that his father was referred for [facility] screening to assess any possible needs for the patient Consent: [facility] RN CM reviewed [facility] services with patient. Patient gave verbal consent for services [facility] telephonic RN CM.   Mr trice, aspinall voiced understanding and requested [facility] RN CM return a call  Plan: [facility] RN CM will follow up with Mr Meter son within the next 1-3 business days    Metztli Sachdev L. [name], RN, BSN, CCM [facility] Telephonic Care Management Care Coordinator Office number [phone] Mobile number [phone]  Main [facility] number [phone] Fax number [phone]

## 2020-07-25 ENCOUNTER — Other Ambulatory Visit: Payer: Self-pay | Admitting: *Deleted

## 2020-07-25 ENCOUNTER — Encounter: Payer: Self-pay | Admitting: *Deleted

## 2020-07-25 DIAGNOSIS — N189 Chronic kidney disease, unspecified: Secondary | ICD-10-CM | POA: Diagnosis not present

## 2020-07-25 DIAGNOSIS — N1832 Chronic kidney disease, stage 3b: Secondary | ICD-10-CM | POA: Diagnosis not present

## 2020-07-25 DIAGNOSIS — I130 Hypertensive heart and chronic kidney disease with heart failure and stage 1 through stage 4 chronic kidney disease, or unspecified chronic kidney disease: Secondary | ICD-10-CM | POA: Diagnosis not present

## 2020-07-25 DIAGNOSIS — Z6822 Body mass index (BMI) 22.0-22.9, adult: Secondary | ICD-10-CM | POA: Diagnosis not present

## 2020-07-25 DIAGNOSIS — J9601 Acute respiratory failure with hypoxia: Secondary | ICD-10-CM | POA: Diagnosis not present

## 2020-07-25 DIAGNOSIS — I5043 Acute on chronic combined systolic (congestive) and diastolic (congestive) heart failure: Secondary | ICD-10-CM | POA: Diagnosis not present

## 2020-07-25 DIAGNOSIS — E1165 Type 2 diabetes mellitus with hyperglycemia: Secondary | ICD-10-CM | POA: Diagnosis not present

## 2020-07-25 DIAGNOSIS — I509 Heart failure, unspecified: Secondary | ICD-10-CM | POA: Diagnosis not present

## 2020-07-25 DIAGNOSIS — E1122 Type 2 diabetes mellitus with diabetic chronic kidney disease: Secondary | ICD-10-CM | POA: Diagnosis not present

## 2020-07-25 NOTE — Patient Outreach (Signed)
[facility]) Care Management  07/25/2020  Franklin Gonzalez 11/21/1921 [id number]   [facility] Telephone Assessment/Screen for MD referral  Franklin Franklin Gonzalez was referred to [facility] on 07/18/20 Referral Source:MD referral from Franklin Gonzalez, [facility] [place] Referral Reason:Please call Patient's son at [phone] as requested per referral information. (Son's was not mentioned)no further reason listed on referral form Referral was marked as urgent, contact request within 72 hours. PCP is in Network  Is on APL  Insurance:NextGen MedicareandUnited Health care [facility]) AARP Last cone admission 06/26/20-07/02/20 for CHF exacerbation    Outreach attempt successful to [phone] son, Georgeis able to verify HIPAA, identifiers Reviewed and addressed with Franklin Gonzalez that his father was referred for [facility] screening to assess any possible needs for the patient Consent: [facility] RN CM reviewed [facility] Gonzalez services with patient. Patient gave verbal consent for services [facility] telephonic RN CM.   Follow up screening with son as requested  Franklin Gonzalez reports Franklin Gonzalez is doing okay at home but he checks on him frequently son Franklin Gonzalez is the primary care giver prn     Social He lives alone [place] manages the medical appointments, provides transportation to appointments, fills medicine containers and offers the medicine to pt He reports Franklin Gonzalez was able to administer his own medicines prior to his second fall in which it is reported that his knees "gave way Retired from [employer] with a BS degree His wife passed in January 2021  Franklin Gonzalez feels Franklin Gonzalez is coping well with her loss [facility] RN CM reviewed [facility] SW services, offered a referral but Franklin Gonzalez feels Franklin Gonzalez is okay at this time and has not noted any symptoms of depression   Franklin Gonzalez's foot that was fractured is reported to be healed  He continues to work with home health with mobility  He uses his walker  to get around in the home  Will need assist with  IADLs (instrumental Activities of Daily Living) He can bathe, dress, feed himself,  can cook and get his own breakfast     DME eye glasses, walker, urinal, bedside commode, nebulizer, pulse oximeter, BP cuff, hearing aid but generally does not use it often DME need or want incentive spirometer, glucometer     Discussed  United Health care [facility]) over the counter (OTC) benefit to obtain some DME no use hearing aid   Franklin Gonzalez voiced interest in having Personal care services (PCS) aide information sent via mail  Plan: [facility] RN CM willfollow up with Franklin Jarrett son within the next 30 business days Pt encouraged to return a call to [facility] RN CM prn   Cala Bradford L. [name], RN, BSN, CCM [facility] Telephonic Care Management Care Coordinator Office number [phone] Main [facility] number [phone] Fax number [phone]

## 2020-07-29 DIAGNOSIS — N1832 Chronic kidney disease, stage 3b: Secondary | ICD-10-CM | POA: Diagnosis not present

## 2020-07-29 DIAGNOSIS — I130 Hypertensive heart and chronic kidney disease with heart failure and stage 1 through stage 4 chronic kidney disease, or unspecified chronic kidney disease: Secondary | ICD-10-CM | POA: Diagnosis not present

## 2020-07-29 DIAGNOSIS — I5043 Acute on chronic combined systolic (congestive) and diastolic (congestive) heart failure: Secondary | ICD-10-CM | POA: Diagnosis not present

## 2020-07-29 DIAGNOSIS — J9601 Acute respiratory failure with hypoxia: Secondary | ICD-10-CM | POA: Diagnosis not present

## 2020-07-29 DIAGNOSIS — E1165 Type 2 diabetes mellitus with hyperglycemia: Secondary | ICD-10-CM | POA: Diagnosis not present

## 2020-07-29 DIAGNOSIS — E1122 Type 2 diabetes mellitus with diabetic chronic kidney disease: Secondary | ICD-10-CM | POA: Diagnosis not present

## 2020-08-01 DIAGNOSIS — E1122 Type 2 diabetes mellitus with diabetic chronic kidney disease: Secondary | ICD-10-CM | POA: Diagnosis not present

## 2020-08-01 DIAGNOSIS — I509 Heart failure, unspecified: Secondary | ICD-10-CM | POA: Diagnosis not present

## 2020-08-01 DIAGNOSIS — J9601 Acute respiratory failure with hypoxia: Secondary | ICD-10-CM | POA: Diagnosis not present

## 2020-08-01 DIAGNOSIS — I130 Hypertensive heart and chronic kidney disease with heart failure and stage 1 through stage 4 chronic kidney disease, or unspecified chronic kidney disease: Secondary | ICD-10-CM | POA: Diagnosis not present

## 2020-08-01 DIAGNOSIS — I5043 Acute on chronic combined systolic (congestive) and diastolic (congestive) heart failure: Secondary | ICD-10-CM | POA: Diagnosis not present

## 2020-08-01 DIAGNOSIS — N1832 Chronic kidney disease, stage 3b: Secondary | ICD-10-CM | POA: Diagnosis not present

## 2020-08-01 DIAGNOSIS — E1165 Type 2 diabetes mellitus with hyperglycemia: Secondary | ICD-10-CM | POA: Diagnosis not present

## 2020-08-01 DIAGNOSIS — N189 Chronic kidney disease, unspecified: Secondary | ICD-10-CM | POA: Diagnosis not present

## 2020-08-02 DIAGNOSIS — I2510 Atherosclerotic heart disease of native coronary artery without angina pectoris: Secondary | ICD-10-CM | POA: Diagnosis not present

## 2020-08-02 DIAGNOSIS — E1165 Type 2 diabetes mellitus with hyperglycemia: Secondary | ICD-10-CM | POA: Diagnosis not present

## 2020-08-02 DIAGNOSIS — I130 Hypertensive heart and chronic kidney disease with heart failure and stage 1 through stage 4 chronic kidney disease, or unspecified chronic kidney disease: Secondary | ICD-10-CM | POA: Diagnosis not present

## 2020-08-02 DIAGNOSIS — Z951 Presence of aortocoronary bypass graft: Secondary | ICD-10-CM | POA: Diagnosis not present

## 2020-08-02 DIAGNOSIS — Z7984 Long term (current) use of oral hypoglycemic drugs: Secondary | ICD-10-CM | POA: Diagnosis not present

## 2020-08-02 DIAGNOSIS — E1122 Type 2 diabetes mellitus with diabetic chronic kidney disease: Secondary | ICD-10-CM | POA: Diagnosis not present

## 2020-08-02 DIAGNOSIS — I471 Supraventricular tachycardia: Secondary | ICD-10-CM | POA: Diagnosis not present

## 2020-08-02 DIAGNOSIS — I453 Trifascicular block: Secondary | ICD-10-CM | POA: Diagnosis not present

## 2020-08-02 DIAGNOSIS — S92311D Displaced fracture of first metatarsal bone, right foot, subsequent encounter for fracture with routine healing: Secondary | ICD-10-CM | POA: Diagnosis not present

## 2020-08-02 DIAGNOSIS — I255 Ischemic cardiomyopathy: Secondary | ICD-10-CM | POA: Diagnosis not present

## 2020-08-02 DIAGNOSIS — J9601 Acute respiratory failure with hypoxia: Secondary | ICD-10-CM | POA: Diagnosis not present

## 2020-08-02 DIAGNOSIS — Z7982 Long term (current) use of aspirin: Secondary | ICD-10-CM | POA: Diagnosis not present

## 2020-08-02 DIAGNOSIS — W19XXXD Unspecified fall, subsequent encounter: Secondary | ICD-10-CM | POA: Diagnosis not present

## 2020-08-02 DIAGNOSIS — E871 Hypo-osmolality and hyponatremia: Secondary | ICD-10-CM | POA: Diagnosis not present

## 2020-08-02 DIAGNOSIS — I252 Old myocardial infarction: Secondary | ICD-10-CM | POA: Diagnosis not present

## 2020-08-02 DIAGNOSIS — Z9181 History of falling: Secondary | ICD-10-CM | POA: Diagnosis not present

## 2020-08-02 DIAGNOSIS — I5043 Acute on chronic combined systolic (congestive) and diastolic (congestive) heart failure: Secondary | ICD-10-CM | POA: Diagnosis not present

## 2020-08-02 DIAGNOSIS — E785 Hyperlipidemia, unspecified: Secondary | ICD-10-CM | POA: Diagnosis not present

## 2020-08-02 DIAGNOSIS — N1832 Chronic kidney disease, stage 3b: Secondary | ICD-10-CM | POA: Diagnosis not present

## 2020-08-05 DIAGNOSIS — I130 Hypertensive heart and chronic kidney disease with heart failure and stage 1 through stage 4 chronic kidney disease, or unspecified chronic kidney disease: Secondary | ICD-10-CM | POA: Diagnosis not present

## 2020-08-05 DIAGNOSIS — E1165 Type 2 diabetes mellitus with hyperglycemia: Secondary | ICD-10-CM | POA: Diagnosis not present

## 2020-08-05 DIAGNOSIS — E1122 Type 2 diabetes mellitus with diabetic chronic kidney disease: Secondary | ICD-10-CM | POA: Diagnosis not present

## 2020-08-05 DIAGNOSIS — J9601 Acute respiratory failure with hypoxia: Secondary | ICD-10-CM | POA: Diagnosis not present

## 2020-08-05 DIAGNOSIS — I5043 Acute on chronic combined systolic (congestive) and diastolic (congestive) heart failure: Secondary | ICD-10-CM | POA: Diagnosis not present

## 2020-08-05 DIAGNOSIS — N1832 Chronic kidney disease, stage 3b: Secondary | ICD-10-CM | POA: Diagnosis not present

## 2020-08-07 DIAGNOSIS — E1122 Type 2 diabetes mellitus with diabetic chronic kidney disease: Secondary | ICD-10-CM | POA: Diagnosis not present

## 2020-08-07 DIAGNOSIS — I130 Hypertensive heart and chronic kidney disease with heart failure and stage 1 through stage 4 chronic kidney disease, or unspecified chronic kidney disease: Secondary | ICD-10-CM | POA: Diagnosis not present

## 2020-08-07 DIAGNOSIS — I2510 Atherosclerotic heart disease of native coronary artery without angina pectoris: Secondary | ICD-10-CM | POA: Diagnosis not present

## 2020-08-07 DIAGNOSIS — E1165 Type 2 diabetes mellitus with hyperglycemia: Secondary | ICD-10-CM | POA: Diagnosis not present

## 2020-08-07 DIAGNOSIS — J9601 Acute respiratory failure with hypoxia: Secondary | ICD-10-CM | POA: Diagnosis not present

## 2020-08-07 DIAGNOSIS — I5043 Acute on chronic combined systolic (congestive) and diastolic (congestive) heart failure: Secondary | ICD-10-CM | POA: Diagnosis not present

## 2020-08-07 DIAGNOSIS — N1832 Chronic kidney disease, stage 3b: Secondary | ICD-10-CM | POA: Diagnosis not present

## 2020-08-11 DIAGNOSIS — E1122 Type 2 diabetes mellitus with diabetic chronic kidney disease: Secondary | ICD-10-CM | POA: Diagnosis not present

## 2020-08-11 DIAGNOSIS — I130 Hypertensive heart and chronic kidney disease with heart failure and stage 1 through stage 4 chronic kidney disease, or unspecified chronic kidney disease: Secondary | ICD-10-CM | POA: Diagnosis not present

## 2020-08-11 DIAGNOSIS — N1832 Chronic kidney disease, stage 3b: Secondary | ICD-10-CM | POA: Diagnosis not present

## 2020-08-11 DIAGNOSIS — I5043 Acute on chronic combined systolic (congestive) and diastolic (congestive) heart failure: Secondary | ICD-10-CM | POA: Diagnosis not present

## 2020-08-11 DIAGNOSIS — E1165 Type 2 diabetes mellitus with hyperglycemia: Secondary | ICD-10-CM | POA: Diagnosis not present

## 2020-08-11 DIAGNOSIS — J9601 Acute respiratory failure with hypoxia: Secondary | ICD-10-CM | POA: Diagnosis not present

## 2020-08-13 NOTE — Progress Notes (Signed)
HPI: FU coronary artery disease. Patient is status post coronary artery bypass graft at [facility] in 1993. He was admitted to [facility] in January of 2014 with chest pain. He ruled in for a non-ST elevation myocardial infarction. Cardiac catheterization in January of 2014 showed an occluded left main, LAD, circumflex. There was a 95% RCA. LIMA to the LAD was patent. Saphenous vein graft to theobtusemarginal had a 95% lesion. Saphenous vein graft to the diagonal was patent. Saphenous vein graft to the RCA had a 99% stenosis. Patient had PCI of the saphenous vein graft to the right coronary artery and saphenous vein graft to the obtuse marginal with drug-eluting stents. Echocardiogram in January 2014 showed an ejection fraction of 45-50%. The patient also had SVT during hospitalization. He was seen by electrophysiology and placed on amiodarone. Carotid Dopplers March 2017 showed moderate atherosclerosis but no hemodynamically significant internal carotid artery stenosis bilaterally.  Patient fell and fractured his leg October 8.  Patient was admitted October 2021 with acute CHF.  Echocardiogram October 2021 showed ejection fraction newly reduced at 25%, mild left ventricular enlargement, mild LVH, grade 2 diastolic dysfunction, biatrial enlargement, mild mitral regurgitation.  Patient was diuresed and medications were adjusted. Troponin 648. He was treated medically.  Since last seen, he has some dyspnea on exertion.  No orthopnea or PND.  Minimal pedal edema.  No chest pain or syncope.  Current Outpatient Medications  Medication Sig Dispense Refill  . amiodarone (PACERONE) 200 MG tablet Take 1/2 (one-half) tablet by mouth once daily 45 tablet 6  . amLODipine (NORVASC) 5 MG tablet Take 5 mg by mouth daily.    Marland Kitchen aspirin EC 81 MG EC tablet Take 1 tablet (81 mg total) by mouth daily.    Marland Kitchen atorvastatin (LIPITOR) 10 MG tablet Take 10 mg by mouth at bedtime.    Marland Kitchen doxazosin (CARDURA) 4 MG  tablet Take 4 mg by mouth at bedtime.    Marland Kitchen levothyroxine (SYNTHROID) 25 MCG tablet Take 1 tablet (25 mcg total) by mouth daily before breakfast. 90 tablet 3  . losartan (COZAAR) 25 MG tablet Take 0.5 tablets (12.5 mg total) by mouth daily. 30 tablet 1  . metFORMIN (GLUCOPHAGE-XR) 500 MG 24 hr tablet Take 1 tablet (500 mg total) by mouth daily.    . nitroGLYCERIN (NITROSTAT) 0.4 MG SL tablet Place 1 tablet (0.4 mg total) under the tongue every 5 (five) minutes x 3 doses as needed for chest pain. 60 tablet 3  . potassium chloride SA (KLOR-CON) 20 MEQ tablet Take 1 tablet (20 mEq total) by mouth daily. 30 tablet 1  . spironolactone (ALDACTONE) 25 MG tablet Take 0.5 tablets (12.5 mg total) by mouth daily. 30 tablet 1  . torsemide (DEMADEX) 20 MG tablet Take 2 tablets (40 mg total) by mouth daily. (Patient taking differently: Take 20 mg by mouth daily.) 30 tablet 1   No current facility-administered medications for this visit.     Past Medical History:  Diagnosis Date  . Bifascicular block   . Coronary artery disease    CABG 1993 at [facility]; NSTEMI 09/2012 s/p DES to SVG-OM1, DES to SVG-PDA   . DM (diabetes mellitus) (HCC)   . HTN (hypertension)   . SVT (supraventricular tachycardia) (HCC)     Past Surgical History:  Procedure Laterality Date  . CARDIAC SURGERY     1993 CABG  . LEFT HEART CATHETERIZATION WITH CORONARY/GRAFT ANGIOGRAM N/A 10/04/2012   Procedure: LEFT HEART  CATHETERIZATION WITH Isabel Caprice;  Surgeon: Tonny Bollman, MD;  Location: [facility] CATH LAB;  Service: Cardiovascular;  Laterality: N/A;    Social History   Socioeconomic History  . Marital status: Widowed    Spouse name: Not on file  . Number of children: Not on file  . Years of education: college- BS  . Highest education level: Bachelor's degree (e.g., BA, AB, BS)  Occupational History  . Occupation: retired    Comment: was in [employer]  Tobacco Use  . Smoking status: Never Smoker  . Smokeless  tobacco: Never Used  Vaping Use  . Vaping Use: Never used  Substance and Sexual Activity  . Alcohol use: No    Alcohol/week: 0.0 standard drinks  . Drug use: No  . Sexual activity: Not on file  Other Topics Concern  . Not on file  Social History Narrative   wife passed in January 2021   son [name] is the primary care giver prn   Retired from [employer] with a BS degree   Social Determinants of Health   Financial Resource Strain: Low Risk   . Difficulty of Paying Living Expenses: Not hard at all  Food Insecurity: No Food Insecurity  . Worried About Programme researcher, broadcasting/film/video in the Last Year: Never true  . Ran Out of Food in the Last Year: Never true  Transportation Needs: No Transportation Needs  . Lack of Transportation (Medical): No  . Lack of Transportation (Non-Medical): No  Physical Activity: Not on file  Stress: No Stress Concern Present  . Feeling of Stress : Not at all  Social Connections: Moderately Integrated  . Frequency of Communication with Friends and Family: More than three times a week  . Frequency of Social Gatherings with Friends and Family: More than three times a week  . Attends Religious Services: 1 to 4 times per year  . Active Member of Clubs or Organizations: Yes  . Attends Banker Meetings: 1 to 4 times per year  . Marital Status: Widowed  Intimate Partner Violence: Not At Risk  . Fear of Current or Ex-Partner: No  . Emotionally Abused: No  . Physically Abused: No  . Sexually Abused: No    Family History  Problem Relation Age of Onset  . CAD Other        multiple relatives including father and son    ROS: no fevers or chills, productive cough, hemoptysis, dysphasia, odynophagia, melena, hematochezia, dysuria, hematuria, rash, seizure activity, orthopnea, PND, pedal edema, claudication. Remaining systems are negative.  Physical Exam: Well-developed frail in no acute distress.  Skin is warm and dry.  HEENT is normal.  Neck is supple.   Chest is clear to auscultation with normal expansion.  Cardiovascular exam is regular rate and rhythm.  Abdominal exam nontender or distended. No masses palpated. Extremities show trace edema. neuro grossly intact  A/P  1 coronary artery disease status post coronary artery bypass graft-given age plan to continue medical therapy.  Continue aspirin and statin.  He is not having chest pain.  2 cardiomyopathy-patient found to have newly reduced LV function during recent admission.  Given his age I do not think we need to be aggressive in pursuing ischemia evaluation and would only plan medical therapy.  Patient and son in agreement.  Continue losartan but increase to 50 mg daily..  3 chronic systolic congestive heart failure-he appears to be euvolemic on examination.  Continue Demadex and spironolactone at present dose.  Check potassium and  renal function.  4 history of supraventricular tachycardia-patient has had no recurrences.  Continue amiodarone.  Check TSH.  Recent liver functions normal.  5 hyperlipidemia-continue statin.  6 hypertension-patient's blood pressure is controlled.  However with severe LV dysfunction will discontinue amlodipine and increase losartan.  7 carotid artery disease-mild on most recent Dopplers.  Olga Millers, MD

## 2020-08-21 ENCOUNTER — Ambulatory Visit (INDEPENDENT_AMBULATORY_CARE_PROVIDER_SITE_OTHER): Payer: Medicare Other | Admitting: Cardiology

## 2020-08-21 ENCOUNTER — Encounter: Payer: Self-pay | Admitting: Cardiology

## 2020-08-21 ENCOUNTER — Other Ambulatory Visit: Payer: Self-pay

## 2020-08-21 VITALS — BP 129/72 | HR 79 | Ht 71.0 in | Wt 153.8 lb

## 2020-08-21 DIAGNOSIS — E7800 Pure hypercholesterolemia, unspecified: Secondary | ICD-10-CM

## 2020-08-21 DIAGNOSIS — I509 Heart failure, unspecified: Secondary | ICD-10-CM | POA: Diagnosis not present

## 2020-08-21 DIAGNOSIS — I2510 Atherosclerotic heart disease of native coronary artery without angina pectoris: Secondary | ICD-10-CM | POA: Diagnosis not present

## 2020-08-21 DIAGNOSIS — N1832 Chronic kidney disease, stage 3b: Secondary | ICD-10-CM | POA: Diagnosis not present

## 2020-08-21 DIAGNOSIS — N189 Chronic kidney disease, unspecified: Secondary | ICD-10-CM | POA: Diagnosis not present

## 2020-08-21 DIAGNOSIS — I5021 Acute systolic (congestive) heart failure: Secondary | ICD-10-CM

## 2020-08-21 DIAGNOSIS — I10 Essential (primary) hypertension: Secondary | ICD-10-CM

## 2020-08-21 DIAGNOSIS — I2583 Coronary atherosclerosis due to lipid rich plaque: Secondary | ICD-10-CM

## 2020-08-21 DIAGNOSIS — Z6822 Body mass index (BMI) 22.0-22.9, adult: Secondary | ICD-10-CM | POA: Diagnosis not present

## 2020-08-21 MED ORDER — LOSARTAN POTASSIUM 50 MG PO TABS: 50.0000 mg | ORAL_TABLET | Freq: Every day | ORAL | 3 refills | Status: DC

## 2020-08-21 NOTE — Patient Instructions (Signed)
Medication Instructions:    STOP AMLODIPINE  INCREASE LOSARTAN TO 50 MG ONCE DAILY= 2 OF THE 25 MG TABLETS ONCE DAILY  *If you need a refill on your cardiac medications before your next appointment, please call your pharmacy*   Lab Work:  Your physician recommends that you HAVE LAB WORK IN ONE WEEK  If you have labs (blood work) drawn today and your tests are completely normal, you will receive your results only by:  MyChart Message (if you have MyChart) OR  A paper copy in the mail If you have any lab test that is abnormal or we need to change your treatment, we will call you to review the results.  Follow-Up: At [facility], you and your health needs are our priority.  As part of our continuing mission to provide you with exceptional heart care, we have created designated Provider Care Teams.  These Care Teams include your primary Cardiologist (physician) and Advanced Practice Providers (APPs -  Physician Assistants and Nurse Practitioners) who all work together to provide you with the care you need, when you need it.  We recommend signing up for the patient portal called "MyChart".  Sign up information is provided on this After Visit Summary.  MyChart is used to connect with patients for Virtual Visits (Telemedicine).  Patients are able to view lab/test results, encounter notes, upcoming appointments, etc.  Non-urgent messages can be sent to your provider as well.   To learn more about what you can do with MyChart, go to [email or web address].    Your next appointment:   2 month(s)  The format for your next appointment:   In Person  Provider:   Olga Millers, MD

## 2020-08-22 DIAGNOSIS — Z6822 Body mass index (BMI) 22.0-22.9, adult: Secondary | ICD-10-CM | POA: Diagnosis not present

## 2020-08-22 DIAGNOSIS — E1165 Type 2 diabetes mellitus with hyperglycemia: Secondary | ICD-10-CM | POA: Diagnosis not present

## 2020-08-22 DIAGNOSIS — N189 Chronic kidney disease, unspecified: Secondary | ICD-10-CM | POA: Diagnosis not present

## 2020-08-22 DIAGNOSIS — I130 Hypertensive heart and chronic kidney disease with heart failure and stage 1 through stage 4 chronic kidney disease, or unspecified chronic kidney disease: Secondary | ICD-10-CM | POA: Diagnosis not present

## 2020-08-22 DIAGNOSIS — N1832 Chronic kidney disease, stage 3b: Secondary | ICD-10-CM | POA: Diagnosis not present

## 2020-08-22 DIAGNOSIS — E782 Mixed hyperlipidemia: Secondary | ICD-10-CM | POA: Diagnosis not present

## 2020-08-22 DIAGNOSIS — I509 Heart failure, unspecified: Secondary | ICD-10-CM | POA: Diagnosis not present

## 2020-08-22 DIAGNOSIS — E1140 Type 2 diabetes mellitus with diabetic neuropathy, unspecified: Secondary | ICD-10-CM | POA: Diagnosis not present

## 2020-08-22 DIAGNOSIS — E1122 Type 2 diabetes mellitus with diabetic chronic kidney disease: Secondary | ICD-10-CM | POA: Diagnosis not present

## 2020-08-22 DIAGNOSIS — Z0001 Encounter for general adult medical examination with abnormal findings: Secondary | ICD-10-CM | POA: Diagnosis not present

## 2020-08-22 DIAGNOSIS — I5043 Acute on chronic combined systolic (congestive) and diastolic (congestive) heart failure: Secondary | ICD-10-CM | POA: Diagnosis not present

## 2020-08-22 DIAGNOSIS — J9601 Acute respiratory failure with hypoxia: Secondary | ICD-10-CM | POA: Diagnosis not present

## 2020-08-22 DIAGNOSIS — I10 Essential (primary) hypertension: Secondary | ICD-10-CM | POA: Diagnosis not present

## 2020-08-22 DIAGNOSIS — I4891 Unspecified atrial fibrillation: Secondary | ICD-10-CM | POA: Diagnosis not present

## 2020-08-28 DIAGNOSIS — E1122 Type 2 diabetes mellitus with diabetic chronic kidney disease: Secondary | ICD-10-CM | POA: Diagnosis not present

## 2020-08-28 DIAGNOSIS — J9601 Acute respiratory failure with hypoxia: Secondary | ICD-10-CM | POA: Diagnosis not present

## 2020-08-28 DIAGNOSIS — I5043 Acute on chronic combined systolic (congestive) and diastolic (congestive) heart failure: Secondary | ICD-10-CM | POA: Diagnosis not present

## 2020-08-28 DIAGNOSIS — E039 Hypothyroidism, unspecified: Secondary | ICD-10-CM | POA: Diagnosis not present

## 2020-08-28 DIAGNOSIS — I5021 Acute systolic (congestive) heart failure: Secondary | ICD-10-CM | POA: Diagnosis not present

## 2020-08-28 DIAGNOSIS — I130 Hypertensive heart and chronic kidney disease with heart failure and stage 1 through stage 4 chronic kidney disease, or unspecified chronic kidney disease: Secondary | ICD-10-CM | POA: Diagnosis not present

## 2020-08-28 DIAGNOSIS — E1165 Type 2 diabetes mellitus with hyperglycemia: Secondary | ICD-10-CM | POA: Diagnosis not present

## 2020-08-28 DIAGNOSIS — N1832 Chronic kidney disease, stage 3b: Secondary | ICD-10-CM | POA: Diagnosis not present

## 2020-09-01 DIAGNOSIS — W19XXXD Unspecified fall, subsequent encounter: Secondary | ICD-10-CM | POA: Diagnosis not present

## 2020-09-01 DIAGNOSIS — S92311D Displaced fracture of first metatarsal bone, right foot, subsequent encounter for fracture with routine healing: Secondary | ICD-10-CM | POA: Diagnosis not present

## 2020-09-01 DIAGNOSIS — I5043 Acute on chronic combined systolic (congestive) and diastolic (congestive) heart failure: Secondary | ICD-10-CM | POA: Diagnosis not present

## 2020-09-01 DIAGNOSIS — E1122 Type 2 diabetes mellitus with diabetic chronic kidney disease: Secondary | ICD-10-CM | POA: Diagnosis not present

## 2020-09-01 DIAGNOSIS — N1832 Chronic kidney disease, stage 3b: Secondary | ICD-10-CM | POA: Diagnosis not present

## 2020-09-01 DIAGNOSIS — I471 Supraventricular tachycardia: Secondary | ICD-10-CM | POA: Diagnosis not present

## 2020-09-01 DIAGNOSIS — Z791 Long term (current) use of non-steroidal anti-inflammatories (NSAID): Secondary | ICD-10-CM | POA: Diagnosis not present

## 2020-09-01 DIAGNOSIS — Z7951 Long term (current) use of inhaled steroids: Secondary | ICD-10-CM | POA: Diagnosis not present

## 2020-09-01 DIAGNOSIS — I255 Ischemic cardiomyopathy: Secondary | ICD-10-CM | POA: Diagnosis not present

## 2020-09-01 DIAGNOSIS — J9601 Acute respiratory failure with hypoxia: Secondary | ICD-10-CM | POA: Diagnosis not present

## 2020-09-01 DIAGNOSIS — I453 Trifascicular block: Secondary | ICD-10-CM | POA: Diagnosis not present

## 2020-09-01 DIAGNOSIS — I252 Old myocardial infarction: Secondary | ICD-10-CM | POA: Diagnosis not present

## 2020-09-01 DIAGNOSIS — I130 Hypertensive heart and chronic kidney disease with heart failure and stage 1 through stage 4 chronic kidney disease, or unspecified chronic kidney disease: Secondary | ICD-10-CM | POA: Diagnosis not present

## 2020-09-01 DIAGNOSIS — E785 Hyperlipidemia, unspecified: Secondary | ICD-10-CM | POA: Diagnosis not present

## 2020-09-01 DIAGNOSIS — Z7984 Long term (current) use of oral hypoglycemic drugs: Secondary | ICD-10-CM | POA: Diagnosis not present

## 2020-09-01 DIAGNOSIS — Z9181 History of falling: Secondary | ICD-10-CM | POA: Diagnosis not present

## 2020-09-01 DIAGNOSIS — E1165 Type 2 diabetes mellitus with hyperglycemia: Secondary | ICD-10-CM | POA: Diagnosis not present

## 2020-09-01 DIAGNOSIS — E871 Hypo-osmolality and hyponatremia: Secondary | ICD-10-CM | POA: Diagnosis not present

## 2020-09-01 DIAGNOSIS — I2510 Atherosclerotic heart disease of native coronary artery without angina pectoris: Secondary | ICD-10-CM | POA: Diagnosis not present

## 2020-09-01 DIAGNOSIS — Z951 Presence of aortocoronary bypass graft: Secondary | ICD-10-CM | POA: Diagnosis not present

## 2020-09-01 DIAGNOSIS — Z7982 Long term (current) use of aspirin: Secondary | ICD-10-CM | POA: Diagnosis not present

## 2020-09-04 DIAGNOSIS — E1122 Type 2 diabetes mellitus with diabetic chronic kidney disease: Secondary | ICD-10-CM | POA: Diagnosis not present

## 2020-09-04 DIAGNOSIS — I5043 Acute on chronic combined systolic (congestive) and diastolic (congestive) heart failure: Secondary | ICD-10-CM | POA: Diagnosis not present

## 2020-09-04 DIAGNOSIS — J9601 Acute respiratory failure with hypoxia: Secondary | ICD-10-CM | POA: Diagnosis not present

## 2020-09-04 DIAGNOSIS — E1165 Type 2 diabetes mellitus with hyperglycemia: Secondary | ICD-10-CM | POA: Diagnosis not present

## 2020-09-04 DIAGNOSIS — I130 Hypertensive heart and chronic kidney disease with heart failure and stage 1 through stage 4 chronic kidney disease, or unspecified chronic kidney disease: Secondary | ICD-10-CM | POA: Diagnosis not present

## 2020-09-04 DIAGNOSIS — N1832 Chronic kidney disease, stage 3b: Secondary | ICD-10-CM | POA: Diagnosis not present

## 2020-09-08 ENCOUNTER — Other Ambulatory Visit: Payer: Self-pay

## 2020-09-08 ENCOUNTER — Other Ambulatory Visit: Payer: Self-pay | Admitting: *Deleted

## 2020-09-08 NOTE — Patient Outreach (Signed)
[facility]) Care Management  09/08/2020  Franklin Gonzalez 1922-06-11 [id number]  [facility] outreach follow up to MD referred patient    Franklin Gonzalez was referred to [facility] on11/12/21 Referral Source:MD referral from Franklin [facility], [facility] [place] Referral Reason:Please call Patient's son at [phone] as requested per referral information. (Son's was not mentioned)no further reason listed on referral form Referral was marked as urgent, contact request within 72 hours. PCP is in Network  Is on APL  Insurance:NextGen MedicareandUnited Health care [facility]) AARP Last cone admission 06/26/20-07/02/20 for CHF exacerbation    Outreach attempt successful to [phone] son, Franklin Gonzalez able to verify HIPAA,identifiers Reviewed and addressed reason for follow up   Follow up  Franklin Gonzalez reports that Franklin Gonzalez is doing better  Denies worsening symptoms of congestive Heart Failure (CHF),  Diabetes (DM) type 2  or Hypertension (HTN) Knees still bad but moving about and managing At some point he will need someone in home with him Franklin Gonzalez has not seen the Personal care services [facility]) mailed in December 2021 that was mailed to the patient [facility] RN CM and Franklin Gonzalez agreed to have these resources sent to [place] address with follow up at a later time Franklin Gonzalez provided his address and it was entered in Epic demographics for future use  DME eye glasses, walker, urinal, bedside commode, nebulizer, pulse oximeter, BP cuff, hearing aid but generally does not use it often DME need or want incentive spirometer, glucometer   Patient Active Problem List   Diagnosis Date Noted  . Benign essential hypertension 07/24/2020    Last Assessment & Plan:  Formatting of this note might be different from the original. Reports value at home today 130/80, will call if persistently higher home values.   . Pure hypercholesterolemia 07/24/2020  . Type 2 diabetes mellitus without  complications (HCC) 07/24/2020    Last Assessment & Plan:  Formatting of this note might be different from the original. A1c today 6.7%, July value was 6.8%, remains under good control, continue metformin.   . Systolic and diastolic CHF, acute (HCC) 06/28/2020  . Acute respiratory failure with hypoxia (HCC) 06/27/2020  . Acute systolic CHF (congestive heart failure) (HCC) 06/27/2020  . Chronic renal insufficiency, stage III (moderate) (HCC) 06/27/2020  . Hyponatremia 06/27/2020  . Acute CHF (congestive heart failure) (HCC) 06/26/2020  . Cerebrovascular disease 04/10/2013  . CAD (coronary artery disease) 10/27/2012  . Bruit 10/27/2012  . Hyperlipidemia 10/27/2012  . Fever 10/07/2012  . Anemia, normocytic normochromic 10/07/2012    H&H-11/32.1   . SVT (supraventricular tachycardia) (HCC) 10/03/2012  . Fasting hyperglycemia      09/2012 CBGs all less than 140; A1c-7.0; no Rx appears necessary   . HTN (hypertension)   . Acute myocardial infarction, subendocardial infarction, initial episode of care [facility]) 10/02/2012    Minimal infarction with peak troponin less than 3      plan [facility] RN CM will follow up with Franklin Bright within the next 30 business days  Pt encouraged to return a call to [facility] RN CM prn  Franklin Bradford L. [name], RN, BSN, CCM [facility] Telephonic Care Management Care Coordinator Office number [phone] Main [facility] number [phone] Fax number [phone]

## 2020-09-10 DIAGNOSIS — J9601 Acute respiratory failure with hypoxia: Secondary | ICD-10-CM | POA: Diagnosis not present

## 2020-09-10 DIAGNOSIS — E1122 Type 2 diabetes mellitus with diabetic chronic kidney disease: Secondary | ICD-10-CM | POA: Diagnosis not present

## 2020-09-10 DIAGNOSIS — I130 Hypertensive heart and chronic kidney disease with heart failure and stage 1 through stage 4 chronic kidney disease, or unspecified chronic kidney disease: Secondary | ICD-10-CM | POA: Diagnosis not present

## 2020-09-10 DIAGNOSIS — I5043 Acute on chronic combined systolic (congestive) and diastolic (congestive) heart failure: Secondary | ICD-10-CM | POA: Diagnosis not present

## 2020-09-10 DIAGNOSIS — N1832 Chronic kidney disease, stage 3b: Secondary | ICD-10-CM | POA: Diagnosis not present

## 2020-09-10 DIAGNOSIS — E1165 Type 2 diabetes mellitus with hyperglycemia: Secondary | ICD-10-CM | POA: Diagnosis not present

## 2020-09-17 DIAGNOSIS — J9601 Acute respiratory failure with hypoxia: Secondary | ICD-10-CM | POA: Diagnosis not present

## 2020-09-17 DIAGNOSIS — E1122 Type 2 diabetes mellitus with diabetic chronic kidney disease: Secondary | ICD-10-CM | POA: Diagnosis not present

## 2020-09-17 DIAGNOSIS — I130 Hypertensive heart and chronic kidney disease with heart failure and stage 1 through stage 4 chronic kidney disease, or unspecified chronic kidney disease: Secondary | ICD-10-CM | POA: Diagnosis not present

## 2020-09-17 DIAGNOSIS — N1832 Chronic kidney disease, stage 3b: Secondary | ICD-10-CM | POA: Diagnosis not present

## 2020-09-17 DIAGNOSIS — I5043 Acute on chronic combined systolic (congestive) and diastolic (congestive) heart failure: Secondary | ICD-10-CM | POA: Diagnosis not present

## 2020-09-17 DIAGNOSIS — E1165 Type 2 diabetes mellitus with hyperglycemia: Secondary | ICD-10-CM | POA: Diagnosis not present

## 2020-09-24 DIAGNOSIS — I5043 Acute on chronic combined systolic (congestive) and diastolic (congestive) heart failure: Secondary | ICD-10-CM | POA: Diagnosis not present

## 2020-09-24 DIAGNOSIS — J9601 Acute respiratory failure with hypoxia: Secondary | ICD-10-CM | POA: Diagnosis not present

## 2020-09-24 DIAGNOSIS — I130 Hypertensive heart and chronic kidney disease with heart failure and stage 1 through stage 4 chronic kidney disease, or unspecified chronic kidney disease: Secondary | ICD-10-CM | POA: Diagnosis not present

## 2020-09-24 DIAGNOSIS — N1832 Chronic kidney disease, stage 3b: Secondary | ICD-10-CM | POA: Diagnosis not present

## 2020-09-24 DIAGNOSIS — E1122 Type 2 diabetes mellitus with diabetic chronic kidney disease: Secondary | ICD-10-CM | POA: Diagnosis not present

## 2020-09-24 DIAGNOSIS — E1165 Type 2 diabetes mellitus with hyperglycemia: Secondary | ICD-10-CM | POA: Diagnosis not present

## 2020-09-29 NOTE — Progress Notes (Signed)
HPI: FU coronary artery disease. Patient is status post coronary artery bypass graft at [facility] in 1993. He was admitted to [facility] in January of 2014 with chest pain. He ruled in for a non-ST elevation myocardial infarction. Cardiac catheterization in January of 2014 showed an occluded left main, LAD, circumflex. There was a 95% RCA. LIMA to the LAD was patent. Saphenous vein graft to theobtusemarginal had a 95% lesion. Saphenous vein graft to the diagonal was patent. Saphenous vein graft to the RCA had a 99% stenosis. Patient had PCI of the saphenous vein graft to the right coronary artery and saphenous vein graft to the obtuse marginal with drug-eluting stents. Echocardiogram in January 2014 showed an ejection fraction of 45-50%. The patient also had SVT during hospitalization. He was seen by electrophysiology and placed on amiodarone. Carotid Dopplers March 2017 showed moderate atherosclerosis but no hemodynamically significant internal carotid artery stenosis bilaterally.  Patient fell and fractured his leg October 8.  Patient was admitted October 2021 with acute CHF.  Echocardiogram October 2021 showed ejection fraction newly reduced at 25%, mild left ventricular enlargement, mild LVH, grade 2 diastolic dysfunction, biatrial enlargement, mild mitral regurgitation.  Patient was diuresed and medications were adjusted. Troponin 648. He was treated medically.  Laboratories from December 2021 showed TSH 18.56 and BUN/creatinine 37/1.47.  Since last seen, he has some dyspnea on exertion and pedal edema.  No chest pain or syncope.  Current Outpatient Medications  Medication Sig Dispense Refill  . amiodarone (PACERONE) 200 MG tablet Take 1/2 (one-half) tablet by mouth once daily 45 tablet 6  . aspirin EC 81 MG EC tablet Take 1 tablet (81 mg total) by mouth daily.    Marland Kitchen atorvastatin (LIPITOR) 10 MG tablet Take 10 mg by mouth at bedtime.    Marland Kitchen doxazosin (CARDURA) 4 MG tablet Take 4  mg by mouth at bedtime.    . EUTHYROX 75 MCG tablet Take 75 mcg by mouth daily.    Marland Kitchen levothyroxine (SYNTHROID) 25 MCG tablet Take 1 tablet (25 mcg total) by mouth daily before breakfast. 90 tablet 3  . losartan (COZAAR) 50 MG tablet Take 1 tablet (50 mg total) by mouth daily. 90 tablet 3  . metFORMIN (GLUCOPHAGE-XR) 500 MG 24 hr tablet Take 1 tablet (500 mg total) by mouth daily.    . nitroGLYCERIN (NITROSTAT) 0.4 MG SL tablet Place 1 tablet (0.4 mg total) under the tongue every 5 (five) minutes x 3 doses as needed for chest pain. 60 tablet 3  . potassium chloride SA (KLOR-CON) 20 MEQ tablet Take 1 tablet (20 mEq total) by mouth daily. 30 tablet 1  . spironolactone (ALDACTONE) 25 MG tablet Take 0.5 tablets (12.5 mg total) by mouth daily. 30 tablet 1  . torsemide (DEMADEX) 20 MG tablet Take 2 tablets (40 mg total) by mouth daily. (Patient taking differently: Take 20 mg by mouth daily.) 30 tablet 1   No current facility-administered medications for this visit.     Past Medical History:  Diagnosis Date  . Bifascicular block   . Coronary artery disease    CABG 1993 at [facility]; NSTEMI 09/2012 s/p DES to SVG-OM1, DES to SVG-PDA   . DM (diabetes mellitus) (HCC)   . HTN (hypertension)   . SVT (supraventricular tachycardia) (HCC)     Past Surgical History:  Procedure Laterality Date  . CARDIAC SURGERY     1993 CABG  . LEFT HEART CATHETERIZATION WITH CORONARY/GRAFT ANGIOGRAM N/A 10/04/2012  Procedure: LEFT HEART CATHETERIZATION WITH Isabel Caprice;  Surgeon: Tonny Bollman, MD;  Location: [facility] CATH LAB;  Service: Cardiovascular;  Laterality: N/A;    Social History   Socioeconomic History  . Marital status: Widowed    Spouse name: Not on file  . Number of children: Not on file  . Years of education: college- BS  . Highest education level: Bachelor's degree (e.g., BA, AB, BS)  Occupational History  . Occupation: retired    Comment: was in [employer]  Tobacco Use  .  Smoking status: Never Smoker  . Smokeless tobacco: Never Used  Vaping Use  . Vaping Use: Never used  Substance and Sexual Activity  . Alcohol use: No    Alcohol/week: 0.0 standard drinks  . Drug use: No  . Sexual activity: Not on file  Other Topics Concern  . Not on file  Social History Narrative   wife passed in January 2021   son [name] is the primary care giver prn   Retired from [employer] with a BS degree   Social Determinants of Health   Financial Resource Strain: Low Risk   . Difficulty of Paying Living Expenses: Not hard at all  Food Insecurity: No Food Insecurity  . Worried About Programme researcher, broadcasting/film/video in the Last Year: Never true  . Ran Out of Food in the Last Year: Never true  Transportation Needs: No Transportation Needs  . Lack of Transportation (Medical): No  . Lack of Transportation (Non-Medical): No  Physical Activity: Not on file  Stress: No Stress Concern Present  . Feeling of Stress : Not at all  Social Connections: Moderately Integrated  . Frequency of Communication with Friends and Family: More than three times a week  . Frequency of Social Gatherings with Friends and Family: More than three times a week  . Attends Religious Services: 1 to 4 times per year  . Active Member of Clubs or Organizations: Yes  . Attends Banker Meetings: 1 to 4 times per year  . Marital Status: Widowed  Intimate Partner Violence: Not At Risk  . Fear of Current or Ex-Partner: No  . Emotionally Abused: No  . Physically Abused: No  . Sexually Abused: No    Family History  Problem Relation Age of Onset  . CAD Other        multiple relatives including father and son    ROS: no fevers or chills, productive cough, hemoptysis, dysphasia, odynophagia, melena, hematochezia, dysuria, hematuria, rash, seizure activity, orthopnea, PND, pedal edema, claudication. Remaining systems are negative.  Physical Exam: Well-developed frail in no acute distress.  Skin is warm and  dry.  HEENT is normal.  Neck is supple.  Chest is clear to auscultation with normal expansion.  Cardiovascular exam is regular rate and rhythm.  Abdominal exam nontender or distended. No masses palpated. Extremities show trace edema. neuro grossly intact  A/P  1 coronary artery disease status post coronary artery bypass graft-as outlined previously given age plan is conservative measures.  Continue medical therapy including aspirin and statin.  2 cardiomyopathy-plan is conservative measures and not pursuing aggressive ischemia evaluation.  We will discontinue losartan and add Entresto 24/26 twice daily.  1 week later we will check potassium, renal function and BNP.  He is not on a beta-blocker due to bradycardia.    3 chronic systolic congestive heart failure-patient appears to be euvolemic today.  Continue diuretics at present dose.    4 history of supraventricular tachycardia-no recurrences.  Continue amiodarone.  Recent TSH was elevated.  We will repeat and check free T4.  Increase Synthroid if needed.  5 hyperlipidemia-continue statin.  6 hypertension-patient's blood pressure is controlled; change losartan to Entresto due to underlying ischemic cardiomyopathy.  7 carotid artery disease-mild on most recent Dopplers.  Olga Millers, MD

## 2020-10-03 ENCOUNTER — Other Ambulatory Visit: Payer: Self-pay | Admitting: *Deleted

## 2020-10-03 ENCOUNTER — Other Ambulatory Visit: Payer: Self-pay

## 2020-10-03 NOTE — Patient Outreach (Addendum)
[facility]) Care Management  10/03/2020  Franklin Gonzalez December 16, 1921 [id number]   [facility] outreach  Franklin Gonzalez was referred to [facility] on11/12/21 Referral Source:MD referral from Selinda Flavin, [facility], [place] Referral Reason:Please call Patient's son at [phone] as requested per referral information. (Son's was not mentioned)no further reason listed on referral form Referral was marked as urgent, contact request within 72 hours. PCP is in Network  Is on APL  Insurance:NextGen MedicareandUnited Health care [facility]) AARP Last cone admission 06/26/20-07/02/20 for CHF exacerbation   Outreach from [place], son after voice message left  Patient is able to verify HIPAA [facility] Portability and Accountability Act) identifiers Reviewed and addressed the purpose of the follow up call with the patient  Consent: [facility] ([facility]) RN CM reviewed [facility] services with patient. Patient gave verbal consent for services.   Follow up  Franklin Gonzalez is reported to be improving well Home health nurse visits have been discontinued as he has improved Franklin Gonzalez still pending review of personal care resources mailed to his address With assessment for worsening symptoms for HTN, DM, CHF and CKD, Franklin Gonzalez denies issues but agrees to follow up in 30 business days  [facility] RN CM intervention DME  Outreach to PCP office [phone] to request assist with incentive spirometer Left message for nurse with Melissa   Plans  [facility] follow up in 30 business days Pt encouraged to return a call to [facility] RN CM prn   Franklin Gonzalez L. [name], RN, BSN, CCM [facility] Telephonic Care Management Care Coordinator Office number [phone] Main [facility] number [phone] Fax number [phone]

## 2020-10-03 NOTE — Patient Outreach (Signed)
[facility]) Care Management  10/03/2020  Franklin Gonzalez Nov 27, 1921 [id number]   [facility] Unsuccessful outreach  [name] was referred to [facility] on11/12/21 Referral Source:MD referral from Selinda Flavin, [facility], [place] Referral Reason:Please call Patient's son at [phone] as requested per referral information. (Son's was not mentioned)no further reason listed on referral form Referral was marked as urgent, contact request within 72 hours. PCP is in Network  Is on APL  Insurance:NextGen MedicareandUnited Health care [facility]) AARP Last cone admission 06/26/20-07/02/20 for CHF exacerbation   Outreach attempt to the home number  No answer. [facility] RN CM left HIPAA [facility] Portability and Accountability Act) compliant voicemail message along with CM's contact info.   Plan: [facility] RN CM scheduled this patient for another call attempt within 4-7 business days  Zaniyah Wernette L. [name], RN, BSN, CCM [facility] Telephonic Care Management Care Coordinator Office number [phone] Mobile number [phone]  Main [facility] number [phone] Fax number [phone]

## 2020-10-13 ENCOUNTER — Encounter: Payer: Self-pay | Admitting: Cardiology

## 2020-10-13 ENCOUNTER — Ambulatory Visit (INDEPENDENT_AMBULATORY_CARE_PROVIDER_SITE_OTHER): Payer: Medicare Other | Admitting: Cardiology

## 2020-10-13 ENCOUNTER — Other Ambulatory Visit: Payer: Self-pay

## 2020-10-13 VITALS — BP 135/78 | HR 87 | Ht 71.0 in | Wt 153.8 lb

## 2020-10-13 DIAGNOSIS — R7989 Other specified abnormal findings of blood chemistry: Secondary | ICD-10-CM | POA: Diagnosis not present

## 2020-10-13 DIAGNOSIS — I5021 Acute systolic (congestive) heart failure: Secondary | ICD-10-CM

## 2020-10-13 DIAGNOSIS — I2510 Atherosclerotic heart disease of native coronary artery without angina pectoris: Secondary | ICD-10-CM | POA: Diagnosis not present

## 2020-10-13 DIAGNOSIS — I2583 Coronary atherosclerosis due to lipid rich plaque: Secondary | ICD-10-CM | POA: Diagnosis not present

## 2020-10-13 DIAGNOSIS — I471 Supraventricular tachycardia: Secondary | ICD-10-CM | POA: Diagnosis not present

## 2020-10-13 DIAGNOSIS — I10 Essential (primary) hypertension: Secondary | ICD-10-CM

## 2020-10-13 DIAGNOSIS — E7800 Pure hypercholesterolemia, unspecified: Secondary | ICD-10-CM | POA: Diagnosis not present

## 2020-10-13 MED ORDER — ENTRESTO 24-26 MG PO TABS: 1.0000 | ORAL_TABLET | Freq: Two times a day (BID) | ORAL | 6 refills | Status: DC

## 2020-10-13 NOTE — Patient Instructions (Signed)
Medication Instructions:   STOP LOSARTAN  START ENTRESTO 24/26 MG ONE TABLET TWICE DAILY  *If you need a refill on your cardiac medications before your next appointment, please call your pharmacy*   Lab Work:  Your physician recommends that you return for lab work in: ONE WEEK  If you have labs (blood work) drawn today and your tests are completely normal, you will receive your results only by: Marland Kitchen MyChart Message (if you have MyChart) OR . A paper copy in the mail If you have any lab test that is abnormal or we need to change your treatment, we will call you to review the results.   Follow-Up: At [facility], you and your health needs are our priority.  As part of our continuing mission to provide you with exceptional heart care, we have created designated Provider Care Teams.  These Care Teams include your primary Cardiologist (physician) and Advanced Practice Providers (APPs -  Physician Assistants and Nurse Practitioners) who all work together to provide you with the care you need, when you need it.  We recommend signing up for the patient portal called "MyChart".  Sign up information is provided on this After Visit Summary.  MyChart is used to connect with patients for Virtual Visits (Telemedicine).  Patients are able to view lab/test results, encounter notes, upcoming appointments, etc.  Non-urgent messages can be sent to your provider as well.   To learn more about what you can do with MyChart, go to [email or web address].    Your next appointment:   3 month(s)  The format for your next appointment:   In Person  Provider:   Olga Millers, MD

## 2020-10-22 ENCOUNTER — Telehealth: Payer: Self-pay | Admitting: Cardiology

## 2020-10-22 ENCOUNTER — Other Ambulatory Visit: Payer: Self-pay | Admitting: *Deleted

## 2020-10-22 NOTE — Telephone Encounter (Signed)
Pt c/o medication issue:  1. Name of Medication:  sacubitril-valsartan (ENTRESTO) 24-26 MG [[id number]]    2. How are you currently taking this medication (dosage and times per day)? 1 in the morning and 1 at night   3. Are you having a reaction (difficulty breathing--STAT)? No   4. What is your medication issue? Pt son this med made the pt feel dizzy and weak and just not well at all.  Pt only took it 1 day.  Son pull the Z[place] and gave him the losartan until they figure out what to do   [phone]- best number

## 2020-10-22 NOTE — Patient Outreach (Signed)
[facility]) Care Management  10/22/2020  Franklin Gonzalez [birth year] [id number]   [facility] Care coordination- DME Request assistance of [facility] CMA to have patient sent incentive spirometer to home address   Franklin Gonzalez L. [name], RN, BSN, CCM [facility] Telephonic Care Management Care Coordinator Office number [phone] Mobile number [phone]  Main [facility] number [phone] Fax number [phone]

## 2020-10-22 NOTE — Telephone Encounter (Signed)
Called patient son, advised that his dad took the Entresto twice on Monday- on Tuesday he states he was not feeling well at all, he was very dizziness, and felt light headed all day long, unable to do anything, on Tuesday his son held the [place], and started him back on Losartan. Now today he is feeling much better, up doing activities blood pressure was 120/60's, patient and his son wanted Dr.Crenshaw to be aware of the effects that this had.   Advised I would route a message to get his recommendations on how to proceed.  Patient son verbalized understanding, thankful for call back.

## 2020-10-23 DIAGNOSIS — E039 Hypothyroidism, unspecified: Secondary | ICD-10-CM | POA: Diagnosis not present

## 2020-10-23 DIAGNOSIS — N189 Chronic kidney disease, unspecified: Secondary | ICD-10-CM | POA: Diagnosis not present

## 2020-10-23 DIAGNOSIS — Z6822 Body mass index (BMI) 22.0-22.9, adult: Secondary | ICD-10-CM | POA: Diagnosis not present

## 2020-10-23 DIAGNOSIS — N1832 Chronic kidney disease, stage 3b: Secondary | ICD-10-CM | POA: Diagnosis not present

## 2020-10-23 DIAGNOSIS — E1122 Type 2 diabetes mellitus with diabetic chronic kidney disease: Secondary | ICD-10-CM | POA: Diagnosis not present

## 2020-10-23 DIAGNOSIS — I509 Heart failure, unspecified: Secondary | ICD-10-CM | POA: Diagnosis not present

## 2020-10-26 NOTE — Telephone Encounter (Signed)
Agree with DCing entresto and continuing losartan Olga Millers

## 2020-10-28 MED ORDER — LOSARTAN POTASSIUM 50 MG PO TABS: 50.0000 mg | ORAL_TABLET | Freq: Every day | ORAL | 3 refills | Status: DC

## 2020-10-28 NOTE — Telephone Encounter (Signed)
Left detailed message for patient son, [name], aware to continue losartan 50 mg once daily. He is to call back if refill needed.

## 2020-10-29 DIAGNOSIS — E039 Hypothyroidism, unspecified: Secondary | ICD-10-CM | POA: Diagnosis not present

## 2020-10-31 ENCOUNTER — Encounter: Payer: Self-pay | Admitting: *Deleted

## 2020-10-31 ENCOUNTER — Other Ambulatory Visit: Payer: Self-pay | Admitting: *Deleted

## 2020-10-31 ENCOUNTER — Other Ambulatory Visit: Payer: Self-pay

## 2020-10-31 NOTE — Patient Outreach (Signed)
[facility]) Care Management  10/31/2020  Franklin Gonzalez 1922/02/15 [id number]    [facility] incoming outreach from son  [name] was referred to [facility] on11/12/21 Referral Source:MD referral from Selinda Flavin, [facility], [place] Referral Reason:Please call Patient's son at [phone] as requested per referral information. (Son's was not mentioned)no further reason listed on referral form Referral was marked as urgent, contact request within 72 hours. PCP is in Network  Is on APL  Insurance:NextGen MedicareandUnited Health care [facility]) AARP Last cone admission 06/26/20-07/02/20 for CHF exacerbation   Incoming outreach from son, Franklin Gonzalez [phone]  Patient is able to verify HIPAA [facility] Portability and Accountability Act) identifiers Reviewed and addressed the purpose of the follow up call with the patient  Consent: [facility] ([facility] RN CM reviewed [facility] services with patient. Patient gave verbal consent for services.   Follow up assessment Incentive spirometer received Franklin Gonzalez reports DME delivered but reviewed possible delivery issues  Consulted [facility] CMA- no issues   Re assessed for care coordination and medical needs Franklin Gonzalez reports [name] is not noting medical issues except pain in joints He reports generally [name] does not go out of the home, watches a lot of TV and only walks in the home.  Home health [facility]) services have concluded and pt is not continuing exercises taught. [facility] RN CM assessed about socialization and hobby activities     Franklin Gonzalez has not had the opportunity to review the Personal care services (PCS) aide resources. [facility] RN CM encouraged increase activity in the home, possible personal companion, increased socialization    Plans  [facility] follow up in 30 business days Pt encouraged to return a call to [facility] RN CM prn  Franklin Gonzalez L. [name], RN, BSN, CCM [facility] Telephonic Care Management Care  Coordinator Office number [phone] Main [facility] number [phone] Fax number [phone]'

## 2020-10-31 NOTE — Patient Outreach (Signed)
[facility]) Care Management  10/31/2020  Franklin Gonzalez Jul 17, 1922 [id number]   [facility] Unsuccessful outreach  Mr [name] was referred to [facility] on11/12/21 Referral Source:MD referral from Selinda Flavin, [facility], [place] Referral Reason:Please call Patient's son at [phone] as requested per referral information. (Son's was not mentioned)no further reason listed on referral form Referral was marked as urgent, contact request within 72 hours. PCP is in Network  Is on APL  Insurance:NextGen MedicareandUnited Health care [facility]) AARP Last cone admission 06/26/20-07/02/20 for CHF exacerbation   Outreach attempt to [phone] No answer. [facility] RN CM left HIPAA [facility] Portability and Accountability Act) compliant voicemail message along with CM's contact info.   Patient Active Problem List   Diagnosis Date Noted  . Benign essential hypertension 07/24/2020  . Pure hypercholesterolemia 07/24/2020  . Type 2 diabetes mellitus without complications (HCC) 07/24/2020  . Systolic and diastolic CHF, acute (HCC) 06/28/2020  . Acute respiratory failure with hypoxia (HCC) 06/27/2020  . Acute systolic CHF (congestive heart failure) (HCC) 06/27/2020  . Chronic renal insufficiency, stage III (moderate) (HCC) 06/27/2020  . Hyponatremia 06/27/2020  . Acute CHF (congestive heart failure) (HCC) 06/26/2020  . Cerebrovascular disease 04/10/2013  . CAD (coronary artery disease) 10/27/2012  . Bruit 10/27/2012  . Hyperlipidemia 10/27/2012  . Fever 10/07/2012  . Anemia, normocytic normochromic 10/07/2012  . SVT (supraventricular tachycardia) (HCC) 10/03/2012  . Fasting hyperglycemia   . HTN (hypertension)   . Acute myocardial infarction, subendocardial infarction, initial episode of care [facility]) 10/02/2012    Plan: [facility] RN CM scheduled this patient for another call attempt within 4-7 business days to follow up on incentive spirometer sent via [employer] L. [name], RN, BSN, CCM [facility] Telephonic Care Management Care Coordinator Office number [phone] Mobile number [phone]  Main [facility] number [phone] Fax number [phone]

## 2020-11-10 ENCOUNTER — Encounter: Payer: Self-pay | Admitting: *Deleted

## 2020-11-19 ENCOUNTER — Telehealth: Payer: Self-pay | Admitting: Cardiology

## 2020-11-19 NOTE — Telephone Encounter (Signed)
Patient's son states the patient needs lab work and would like to know if the orders can be sent somewhere closer to them by [facility].

## 2020-11-19 NOTE — Telephone Encounter (Signed)
Spoke with pt's son and informed son pt may go to [facility] in [place] for labs .After much discussion son will bring to [facility] for labs Instructed if a change of plans to call back so lab orders can be placed so that [facility] can see orders . Son verbalized understanding /cy

## 2020-11-20 ENCOUNTER — Other Ambulatory Visit: Payer: Self-pay | Admitting: *Deleted

## 2020-11-20 NOTE — Patient Outreach (Addendum)
[facility]) Care Management  11/20/2020  Quindarius Cabello 27-Aug-1922 [id number]   [facility] Unsuccessful outreach  Mr Clyde Zarrella was referred to [facility] on11/12/21 Referral Source:MD referral from Selinda Flavin, [facility], [place] Referral Reason:Please call Patient's son at [phone] as requested per referral information. (Son's was not mentioned)no further reason listed on referral form Referral was marked as urgent, contact request within 72 hours. PCP is in Network  Is on APL  Insurance:NextGen MedicareandUnited Health care [facility]) AARP Last cone admission 06/26/20-07/02/20 for CHF exacerbation   Outreach attempt to the home number  No answer. [facility] RN CM left HIPAA [facility] Portability and Accountability Act) compliant voicemail message along with CM's contact info.   Plan: [facility] RN CM scheduled this patient for another call attempt within 4-7 business days   Tracye Szuch L. [name], RN, BSN, CCM [facility] Telephonic Care Management Care Coordinator Office number [phone] Mobile number [phone]  Main [facility] number [phone] Fax number [phone]

## 2020-11-21 DIAGNOSIS — I2583 Coronary atherosclerosis due to lipid rich plaque: Secondary | ICD-10-CM | POA: Diagnosis not present

## 2020-11-21 DIAGNOSIS — I2510 Atherosclerotic heart disease of native coronary artery without angina pectoris: Secondary | ICD-10-CM | POA: Diagnosis not present

## 2020-11-21 DIAGNOSIS — R7989 Other specified abnormal findings of blood chemistry: Secondary | ICD-10-CM | POA: Diagnosis not present

## 2020-11-21 DIAGNOSIS — I5021 Acute systolic (congestive) heart failure: Secondary | ICD-10-CM | POA: Diagnosis not present

## 2020-11-22 LAB — PRO B NATRIURETIC PEPTIDE: NT-Pro BNP: 11033 pg/mL — ABNORMAL HIGH (ref 0–486)

## 2020-11-22 LAB — BASIC METABOLIC PANEL
BUN/Creatinine Ratio: 26 — ABNORMAL HIGH (ref 10–24)
BUN: 39 mg/dL — ABNORMAL HIGH (ref 10–36)
CO2: 21 mmol/L (ref 20–29)
Calcium: 9.4 mg/dL (ref 8.6–10.2)
Chloride: 103 mmol/L (ref 96–106)
Creatinine, Ser: 1.5 mg/dL — ABNORMAL HIGH (ref 0.76–1.27)
Glucose: 137 mg/dL — ABNORMAL HIGH (ref 65–99)
Potassium: 4.8 mmol/L (ref 3.5–5.2)
Sodium: 139 mmol/L (ref 134–144)
eGFR: 42 mL/min/{1.73_m2} — ABNORMAL LOW (ref >59)

## 2020-11-22 LAB — TSH+FREE T4
Free T4: 1.34 ng/dL (ref 0.82–1.77)
TSH: 10.8 u[IU]/mL — ABNORMAL HIGH (ref 0.450–4.500)

## 2020-11-25 ENCOUNTER — Telehealth: Payer: Self-pay | Admitting: *Deleted

## 2020-11-25 DIAGNOSIS — N1832 Chronic kidney disease, stage 3b: Secondary | ICD-10-CM

## 2020-11-25 DIAGNOSIS — I5021 Acute systolic (congestive) heart failure: Secondary | ICD-10-CM

## 2020-11-25 MED ORDER — TORSEMIDE 20 MG PO TABS: ORAL_TABLET | ORAL | 1 refills | Status: DC

## 2020-11-25 NOTE — Telephone Encounter (Signed)
Spoke with pt son, he reports the patient is taking 20 mg every other day of torsemide and per [name] at [facility], for a weight gain above 155 lb the patient will take the torsemide daily until weight is back down. Son reports the patient takes the potassium when he takes the torsemide. Will forward for dr Jens Som review to confirm directions.

## 2020-11-25 NOTE — Telephone Encounter (Signed)
-----   Message from Lewayne Bunting, MD sent at 11/22/2020 10:08 AM EDT ----- Increase demadex to 40 mg daily alternating with 20 mg daily; bmet one week Olga Millers

## 2020-11-25 NOTE — Telephone Encounter (Signed)
Spoke with pt son, Aware of dr crenshaw's recommendations.  Lab orders mailed to the pt  

## 2020-11-25 NOTE — Telephone Encounter (Signed)
Ok with plan as outlined by pts son; check bmet one week  Franklin Gonzalez

## 2020-12-02 DIAGNOSIS — E039 Hypothyroidism, unspecified: Secondary | ICD-10-CM | POA: Diagnosis not present

## 2020-12-08 ENCOUNTER — Telehealth: Payer: Self-pay | Admitting: Cardiology

## 2020-12-08 NOTE — Telephone Encounter (Signed)
New Message:      Son said pt had lab work last week. He said on Friday pt received a lab order in the mail. Does he still need more lab work?

## 2020-12-08 NOTE — Telephone Encounter (Signed)
Spoke with [name], pt's son (ok per DPR). Pt recently received lab orders in the mail from [facility], per Dr. Jens Som. [name] is wanting to know if pt needs these labs to be drawn, son states that pt just had labs on Friday at Dr. Mayford Knife office. Explained to son that as long as the labs that were drawn on Friday were the same labs that Dr. Jens Som wanted then he would not need to have labs drawn again. George verbalizes understanding.  Called Dr. Mayford Knife office to have labs faxed over.

## 2020-12-09 NOTE — Telephone Encounter (Signed)
Need copy of labs Franklin Gonzalez

## 2020-12-11 ENCOUNTER — Other Ambulatory Visit: Payer: Self-pay | Admitting: *Deleted

## 2020-12-11 ENCOUNTER — Other Ambulatory Visit: Payer: Self-pay

## 2020-12-11 NOTE — Patient Outreach (Addendum)
[facility]) Care Management  12/11/2020  Franklin Gonzalez [birth year] [id number]   [facility] outreach to complex care patient  Franklin Gonzalez was referred to [facility] on11/12/21 Referral Source:MD referral from Selinda Flavin, [facility], [place] Referral Reason:Please call Patient's son at [phone] as requested per referral information. (Son's was not mentioned)no further reason listed on referral form Referral was marked as urgent, contact request within 72 hours. PCP is in Network  Is on APL  Insurance:NextGen MedicareandUnited Health care [facility]) AARP Last cone admission 06/26/20-07/02/20 for CHF exacerbation  Outreach to Franklin Gonzalez Patient is able to verify HIPAA [facility] Portability and Accountability Act) identifiers Reviewed and addressed the purpose of the follow up call with the patient  Consent: [facility] ([facility]) RN CM reviewed [facility] services with patient. Patient gave verbal consent for services.  Assessment Franklin Gonzalez reports he feels he is doing good He reports staying at home a lot with very good support from his son, [name] for transportation, meals, errands and setup of his medications in pill containers  Assessed for worsening chest pain, breathing, congestive Heart Failure (CHF)  and DM symptoms  He reports no trouble breathing, no chest pain, no swelling and managed Diabetes He does voice that his only concern remains with his "weak knees" Use walker to ambulate in the home He goes out in yard "only seldom" as he is worried about falling He denies recent falls  He takes his phone each time he goes outside for safety reasons    congestive Heart Failure (CHF) Has scales and generally manages at home to follow his fluid restriction "less than a liter" and weight (wt) range of 150-155 lbs  He reports today he was 152.8 lbs  [facility] RN CM discussed speaking with [name] about keeping him active Franklin  Westberg reports he will try to keep more active [facility] RN CM discuss passive motions to include patting of his feet while sitting He voiced interest Encouraged him to keep active  He denies concerns with medications  [facility] RN CM progression discussed  He denies any further care coordination needs or disease management services Franklin Basey agrees with [facility] case closure if his son is not able to think of further needs    He inquired about his next appointment with [name]  [facility] RN CM unable to locate a scheduled appointment in [facility] and updated him [facility] RN CM outreach to primary care provider (PCP) office [phone] after completed outreach with patient but the office was closed for a meeting until 2 pm per the outgoing  voice message  [facility] RN CM outreach to primary care provider (PCP) office [phone]  Spoke with Lauren to confirm his next pcp appointment Jan 20 2021 at 1130 with [name]   Plan: [facility] RN CM scheduled this patient for pending case closure if no further needs identified by his son with possible return call   [name] L. [name], RN, BSN, CCM [facility] Telephonic Care Management Care Coordinator Office number [phone] Mobile number [phone]  Main [facility] number [phone] Fax number [phone]

## 2020-12-11 NOTE — Telephone Encounter (Signed)
Spoke with pt son, aware we received the lab work from [name] and the patient does not need anymore lab work right now.

## 2020-12-11 NOTE — Patient Outreach (Signed)
[facility]) Care Management  12/11/2020  Franklin Gonzalez 17-Jul-1922 [id number]   [facility] Unsuccessful outreach   Outreach attempt to the home number to the preferred outreach number listed in EPIC [id number] This is his son Franklin Gonzalez number  No answer. [facility] RN CM left HIPAA [facility] Portability and Accountability Act) compliant voicemail message along with CM's contact info.   Plan: [facility] RN CM scheduled this patient for another call attempt within 4-7 business days  [name] L. [name], RN, BSN, CCM [facility] Telephonic Care Management Care Coordinator Office number [phone] Mobile number [phone]  Main [facility] number [phone] Fax number [phone]

## 2021-01-12 DIAGNOSIS — E113213 Type 2 diabetes mellitus with mild nonproliferative diabetic retinopathy with macular edema, bilateral: Secondary | ICD-10-CM | POA: Diagnosis not present

## 2021-01-12 NOTE — Progress Notes (Signed)
HPI: FU coronary artery disease. Patient is status post coronary artery bypass graft at [facility] in 1993. He was admitted to [facility] in January of 2014 with chest pain. He ruled in for a non-ST elevation myocardial infarction. Cardiac catheterization in January of 2014 showed an occluded left main, LAD, circumflex. There was a 95% RCA. LIMA to the LAD was patent. Saphenous vein graft to theobtusemarginal had a 95% lesion. Saphenous vein graft to the diagonal was patent. Saphenous vein graft to the RCA had a 99% stenosis. Patient had PCI of the saphenous vein graft to the right coronary artery and saphenous vein graft to the obtuse marginal with drug-eluting stents. Echocardiogram in January 2014 showed an ejection fraction of 45-50%. The patient also had SVT during hospitalization. He was seen by electrophysiology and placed on amiodarone. Carotid Dopplers March 2017 showed moderate atherosclerosis but no hemodynamically significant internal carotid artery stenosis bilaterally. Patient was admitted October 2021 with acute CHF. Echocardiogram October 2021 showed ejection fraction newly reduced at 25%, mild left ventricular enlargement, mild LVH, grade 2 diastolic dysfunction, biatrial enlargement, mild mitral regurgitation.  Patient was diuresed and medications were adjusted. He was treated medically. Since last seen, he denies dyspnea or chest pain; he complains of fatigue; occasional mild pedal edema.  Current Outpatient Medications  Medication Sig Dispense Refill  . amiodarone (PACERONE) 200 MG tablet Take 1/2 (one-half) tablet by mouth once daily 45 tablet 6  . aspirin EC 81 MG EC tablet Take 1 tablet (81 mg total) by mouth daily.    Marland Kitchen atorvastatin (LIPITOR) 10 MG tablet Take 10 mg by mouth at bedtime.    Marland Kitchen doxazosin (CARDURA) 4 MG tablet Take 4 mg by mouth at bedtime.    . EUTHYROX 75 MCG tablet Take 75 mcg by mouth daily.    Marland Kitchen losartan (COZAAR) 50 MG tablet Take 1 tablet  (50 mg total) by mouth daily. 90 tablet 3  . nitroGLYCERIN (NITROSTAT) 0.4 MG SL tablet Place 1 tablet (0.4 mg total) under the tongue every 5 (five) minutes x 3 doses as needed for chest pain. 60 tablet 3  . potassium chloride SA (KLOR-CON) 20 MEQ tablet Take 1 tablet (20 mEq total) by mouth daily. 30 tablet 1  . spironolactone (ALDACTONE) 25 MG tablet Take 0.5 tablets (12.5 mg total) by mouth daily. 30 tablet 1  . torsemide (DEMADEX) 20 MG tablet Take one tablet every other day, take one tablet daily for weight gain over 155 lb 30 tablet 1   No current facility-administered medications for this visit.     Past Medical History:  Diagnosis Date  . Bifascicular block   . Coronary artery disease    CABG 1993 at [facility]; NSTEMI 09/2012 s/p DES to SVG-OM1, DES to SVG-PDA   . DM (diabetes mellitus) (HCC)   . HTN (hypertension)   . SVT (supraventricular tachycardia) (HCC)     Past Surgical History:  Procedure Laterality Date  . CARDIAC SURGERY     1993 CABG  . LEFT HEART CATHETERIZATION WITH CORONARY/GRAFT ANGIOGRAM N/A 10/04/2012   Procedure: LEFT HEART CATHETERIZATION WITH Isabel Caprice;  Surgeon: Tonny Bollman, MD;  Location: [facility] CATH LAB;  Service: Cardiovascular;  Laterality: N/A;    Social History   Socioeconomic History  . Marital status: Widowed    Spouse name: Not on file  . Number of children: Not on file  . Years of education: college- BS  . Highest education level: Bachelor's degree (e.g.,  BA, AB, BS)  Occupational History  . Occupation: retired    Comment: was in [employer]  Tobacco Use  . Smoking status: Never Smoker  . Smokeless tobacco: Never Used  Vaping Use  . Vaping Use: Never used  Substance and Sexual Activity  . Alcohol use: No    Alcohol/week: 0.0 standard drinks  . Drug use: No  . Sexual activity: Not on file  Other Topics Concern  . Not on file  Social History Narrative   wife passed in January 2021   son [name] is the primary  care giver prn   Retired from [employer] with a BS degree   Social Determinants of Health   Financial Resource Strain: Low Risk   . Difficulty of Paying Living Expenses: Not hard at all  Food Insecurity: No Food Insecurity  . Worried About Programme researcher, broadcasting/film/video in the Last Year: Never true  . Ran Out of Food in the Last Year: Never true  Transportation Needs: No Transportation Needs  . Lack of Transportation (Medical): No  . Lack of Transportation (Non-Medical): No  Physical Activity: Not on file  Stress: No Stress Concern Present  . Feeling of Stress : Not at all  Social Connections: Moderately Integrated  . Frequency of Communication with Friends and Family: More than three times a week  . Frequency of Social Gatherings with Friends and Family: More than three times a week  . Attends Religious Services: 1 to 4 times per year  . Active Member of Clubs or Organizations: Yes  . Attends Banker Meetings: 1 to 4 times per year  . Marital Status: Widowed  Intimate Partner Violence: Not At Risk  . Fear of Current or Ex-Partner: No  . Emotionally Abused: No  . Physically Abused: No  . Sexually Abused: No    Family History  Problem Relation Age of Onset  . CAD Other        multiple relatives including father and son    ROS: fatigue but no fevers or chills, productive cough, hemoptysis, dysphasia, odynophagia, melena, hematochezia, dysuria, hematuria, rash, seizure activity, orthopnea, PND, claudication. Remaining systems are negative.  Physical Exam: Well-developed elderly in no acute distress.  Skin is warm and dry.  HEENT is normal.  Neck is supple.  Chest is clear to auscultation with normal expansion.  Cardiovascular exam is regular rate and rhythm.  Abdominal exam nontender or distended. No masses palpated. Extremities show trace to 1+ edema. neuro grossly intact   A/P  1 coronary artery disease status post coronary artery bypass graft-as outlined  previously we plan conservative measures given patient's age.  Continue aspirin and statin.  2 cardiomyopathy-we are not pursuing ischemia evaluation given patient's age.  Patient and family are in agreement.  Continue losartan; did not tolerate entresto.  No beta-blocker given history of bradycardia.  3 chronic systolic congestive heart failure-he is euvolemic on examination.  Continue diuretic at present dose.  Check potassium and renal function.  4 hypertension-blood pressure is controlled.  Continue present medications and follow.  5 hyperlipidemia-continue statin.  6 supraventricular tachycardia-no recurrences based on history.  Continue amiodarone.  7 carotid artery disease-mild on most recent Dopplers.  Olga Millers, MD

## 2021-01-19 ENCOUNTER — Ambulatory Visit: Payer: Medicare Other | Admitting: Cardiology

## 2021-01-20 ENCOUNTER — Other Ambulatory Visit: Payer: Self-pay

## 2021-01-20 ENCOUNTER — Encounter: Payer: Self-pay | Admitting: Cardiology

## 2021-01-20 ENCOUNTER — Ambulatory Visit (INDEPENDENT_AMBULATORY_CARE_PROVIDER_SITE_OTHER): Payer: Medicare Other | Admitting: Cardiology

## 2021-01-20 VITALS — BP 138/72 | HR 68 | Ht 71.0 in | Wt 151.2 lb

## 2021-01-20 DIAGNOSIS — I2583 Coronary atherosclerosis due to lipid rich plaque: Secondary | ICD-10-CM

## 2021-01-20 DIAGNOSIS — N1832 Chronic kidney disease, stage 3b: Secondary | ICD-10-CM | POA: Diagnosis not present

## 2021-01-20 DIAGNOSIS — I2510 Atherosclerotic heart disease of native coronary artery without angina pectoris: Secondary | ICD-10-CM | POA: Diagnosis not present

## 2021-01-20 DIAGNOSIS — I10 Essential (primary) hypertension: Secondary | ICD-10-CM

## 2021-01-20 DIAGNOSIS — I471 Supraventricular tachycardia: Secondary | ICD-10-CM

## 2021-01-20 DIAGNOSIS — E7800 Pure hypercholesterolemia, unspecified: Secondary | ICD-10-CM | POA: Diagnosis not present

## 2021-01-20 LAB — BASIC METABOLIC PANEL
BUN/Creatinine Ratio: 26 — ABNORMAL HIGH (ref 10–24)
BUN: 36 mg/dL (ref 10–36)
CO2: 21 mmol/L (ref 20–29)
Calcium: 9.3 mg/dL (ref 8.6–10.2)
Chloride: 104 mmol/L (ref 96–106)
Creatinine, Ser: 1.37 mg/dL — ABNORMAL HIGH (ref 0.76–1.27)
Glucose: 191 mg/dL — ABNORMAL HIGH (ref 65–99)
Potassium: 5.1 mmol/L (ref 3.5–5.2)
Sodium: 139 mmol/L (ref 134–144)
eGFR: 47 mL/min/{1.73_m2} — ABNORMAL LOW (ref >59)

## 2021-01-20 NOTE — Patient Instructions (Signed)

## 2021-01-21 ENCOUNTER — Encounter: Payer: Self-pay | Admitting: *Deleted

## 2021-01-22 DIAGNOSIS — E1122 Type 2 diabetes mellitus with diabetic chronic kidney disease: Secondary | ICD-10-CM | POA: Diagnosis not present

## 2021-01-22 DIAGNOSIS — Z6822 Body mass index (BMI) 22.0-22.9, adult: Secondary | ICD-10-CM | POA: Diagnosis not present

## 2021-01-22 DIAGNOSIS — I509 Heart failure, unspecified: Secondary | ICD-10-CM | POA: Diagnosis not present

## 2021-01-22 DIAGNOSIS — E039 Hypothyroidism, unspecified: Secondary | ICD-10-CM | POA: Diagnosis not present

## 2021-01-22 DIAGNOSIS — N1832 Chronic kidney disease, stage 3b: Secondary | ICD-10-CM | POA: Diagnosis not present

## 2021-01-22 DIAGNOSIS — Z8679 Personal history of other diseases of the circulatory system: Secondary | ICD-10-CM | POA: Diagnosis not present

## 2021-01-26 ENCOUNTER — Other Ambulatory Visit: Payer: Self-pay | Admitting: *Deleted

## 2021-01-26 NOTE — Patient Outreach (Signed)
[facility]) Care Management  01/26/2021  Franklin Gonzalez 11-07-1921 [id number]   [facility] Case closure  [name] was referred to [facility] on11/12/21 Referral Source:MD referral from Selinda Flavin, [facility], [place] Referral Reason:Please call Patient's son at [phone] as requested per referral information. (Son's was not mentioned)no further reason listed on referral form Referral was marked as urgent, contact request within 72 hours. PCP is in Network  Is on APL  Insurance:NextGen MedicareandUnited Health care [facility]) AARP Last cone admission 06/26/20-07/02/20 for CHF exacerbation   No return calls from son [name] for other possible disease management needs Reviewed recent Cardiology office visit notes indicating pt without issues with next cardiac follow up in 6 months   Case closure if no return call from son discussed with patient during last patient outreach on 12/11/20   Plan [facility] RN CM will close case after no response from patient's son within 10 business days. Case closure letters sent to patient and MD  Cala Bradford L. [name], RN, BSN, CCM [facility] Telephonic Care Management Care Coordinator Office number [phone] Mobile number [phone]  Main [facility] number [phone] Fax number [phone]

## 2021-02-04 DIAGNOSIS — E1151 Type 2 diabetes mellitus with diabetic peripheral angiopathy without gangrene: Secondary | ICD-10-CM | POA: Diagnosis not present

## 2021-02-04 DIAGNOSIS — M79672 Pain in left foot: Secondary | ICD-10-CM | POA: Diagnosis not present

## 2021-02-04 DIAGNOSIS — M79674 Pain in right toe(s): Secondary | ICD-10-CM | POA: Diagnosis not present

## 2021-02-04 DIAGNOSIS — L609 Nail disorder, unspecified: Secondary | ICD-10-CM | POA: Diagnosis not present

## 2021-02-04 DIAGNOSIS — M79675 Pain in left toe(s): Secondary | ICD-10-CM | POA: Diagnosis not present

## 2021-02-04 DIAGNOSIS — M79671 Pain in right foot: Secondary | ICD-10-CM | POA: Diagnosis not present

## 2021-02-26 DIAGNOSIS — E7849 Other hyperlipidemia: Secondary | ICD-10-CM | POA: Diagnosis not present

## 2021-02-26 DIAGNOSIS — N1832 Chronic kidney disease, stage 3b: Secondary | ICD-10-CM | POA: Diagnosis not present

## 2021-02-26 DIAGNOSIS — E782 Mixed hyperlipidemia: Secondary | ICD-10-CM | POA: Diagnosis not present

## 2021-02-26 DIAGNOSIS — E1122 Type 2 diabetes mellitus with diabetic chronic kidney disease: Secondary | ICD-10-CM | POA: Diagnosis not present

## 2021-02-26 DIAGNOSIS — E039 Hypothyroidism, unspecified: Secondary | ICD-10-CM | POA: Diagnosis not present

## 2021-04-22 DIAGNOSIS — E1122 Type 2 diabetes mellitus with diabetic chronic kidney disease: Secondary | ICD-10-CM | POA: Diagnosis not present

## 2021-04-22 DIAGNOSIS — E039 Hypothyroidism, unspecified: Secondary | ICD-10-CM | POA: Diagnosis not present

## 2021-04-22 DIAGNOSIS — Z8679 Personal history of other diseases of the circulatory system: Secondary | ICD-10-CM | POA: Diagnosis not present

## 2021-04-22 DIAGNOSIS — Z6821 Body mass index (BMI) 21.0-21.9, adult: Secondary | ICD-10-CM | POA: Diagnosis not present

## 2021-04-22 DIAGNOSIS — I509 Heart failure, unspecified: Secondary | ICD-10-CM | POA: Diagnosis not present

## 2021-04-22 DIAGNOSIS — N1832 Chronic kidney disease, stage 3b: Secondary | ICD-10-CM | POA: Diagnosis not present

## 2021-04-22 DIAGNOSIS — Z6822 Body mass index (BMI) 22.0-22.9, adult: Secondary | ICD-10-CM | POA: Diagnosis not present

## 2021-04-30 DIAGNOSIS — Z23 Encounter for immunization: Secondary | ICD-10-CM | POA: Diagnosis not present

## 2021-05-06 DIAGNOSIS — H401121 Primary open-angle glaucoma, left eye, mild stage: Secondary | ICD-10-CM | POA: Diagnosis not present

## 2021-05-06 DIAGNOSIS — H348111 Central retinal vein occlusion, right eye, with retinal neovascularization: Secondary | ICD-10-CM | POA: Diagnosis not present

## 2021-05-06 DIAGNOSIS — H4051X3 Glaucoma secondary to other eye disorders, right eye, severe stage: Secondary | ICD-10-CM | POA: Diagnosis not present

## 2021-05-13 DIAGNOSIS — M79672 Pain in left foot: Secondary | ICD-10-CM | POA: Diagnosis not present

## 2021-05-13 DIAGNOSIS — L609 Nail disorder, unspecified: Secondary | ICD-10-CM | POA: Diagnosis not present

## 2021-05-13 DIAGNOSIS — M79675 Pain in left toe(s): Secondary | ICD-10-CM | POA: Diagnosis not present

## 2021-05-13 DIAGNOSIS — M79671 Pain in right foot: Secondary | ICD-10-CM | POA: Diagnosis not present

## 2021-05-13 DIAGNOSIS — M79674 Pain in right toe(s): Secondary | ICD-10-CM | POA: Diagnosis not present

## 2021-05-13 DIAGNOSIS — E1151 Type 2 diabetes mellitus with diabetic peripheral angiopathy without gangrene: Secondary | ICD-10-CM | POA: Diagnosis not present

## 2021-05-21 DIAGNOSIS — H348111 Central retinal vein occlusion, right eye, with retinal neovascularization: Secondary | ICD-10-CM | POA: Diagnosis not present

## 2021-05-21 DIAGNOSIS — H4051X3 Glaucoma secondary to other eye disorders, right eye, severe stage: Secondary | ICD-10-CM | POA: Diagnosis not present

## 2021-06-09 DIAGNOSIS — H04123 Dry eye syndrome of bilateral lacrimal glands: Secondary | ICD-10-CM | POA: Diagnosis not present

## 2021-06-14 ENCOUNTER — Other Ambulatory Visit: Payer: Self-pay | Admitting: Cardiology

## 2021-06-17 DIAGNOSIS — H401121 Primary open-angle glaucoma, left eye, mild stage: Secondary | ICD-10-CM | POA: Diagnosis not present

## 2021-06-17 DIAGNOSIS — H4051X3 Glaucoma secondary to other eye disorders, right eye, severe stage: Secondary | ICD-10-CM | POA: Diagnosis not present

## 2021-06-17 DIAGNOSIS — H348111 Central retinal vein occlusion, right eye, with retinal neovascularization: Secondary | ICD-10-CM | POA: Diagnosis not present

## 2021-07-08 DIAGNOSIS — H4051X3 Glaucoma secondary to other eye disorders, right eye, severe stage: Secondary | ICD-10-CM | POA: Diagnosis not present

## 2021-07-08 DIAGNOSIS — H401121 Primary open-angle glaucoma, left eye, mild stage: Secondary | ICD-10-CM | POA: Diagnosis not present

## 2021-07-08 DIAGNOSIS — H04123 Dry eye syndrome of bilateral lacrimal glands: Secondary | ICD-10-CM | POA: Diagnosis not present

## 2021-07-08 DIAGNOSIS — H348111 Central retinal vein occlusion, right eye, with retinal neovascularization: Secondary | ICD-10-CM | POA: Diagnosis not present

## 2021-07-10 DIAGNOSIS — Z20828 Contact with and (suspected) exposure to other viral communicable diseases: Secondary | ICD-10-CM | POA: Diagnosis not present

## 2021-07-12 DIAGNOSIS — W19XXXA Unspecified fall, initial encounter: Secondary | ICD-10-CM | POA: Diagnosis not present

## 2021-07-12 DIAGNOSIS — R0902 Hypoxemia: Secondary | ICD-10-CM | POA: Diagnosis not present

## 2021-07-13 DIAGNOSIS — J40 Bronchitis, not specified as acute or chronic: Secondary | ICD-10-CM | POA: Diagnosis not present

## 2021-07-13 DIAGNOSIS — Z20828 Contact with and (suspected) exposure to other viral communicable diseases: Secondary | ICD-10-CM | POA: Diagnosis not present

## 2021-07-13 DIAGNOSIS — J210 Acute bronchiolitis due to respiratory syncytial virus: Secondary | ICD-10-CM | POA: Diagnosis not present

## 2021-07-13 DIAGNOSIS — R531 Weakness: Secondary | ICD-10-CM | POA: Diagnosis not present

## 2021-07-13 DIAGNOSIS — Z6828 Body mass index (BMI) 28.0-28.9, adult: Secondary | ICD-10-CM | POA: Diagnosis not present

## 2021-07-13 DIAGNOSIS — J329 Chronic sinusitis, unspecified: Secondary | ICD-10-CM | POA: Diagnosis not present

## 2021-07-13 DIAGNOSIS — I509 Heart failure, unspecified: Secondary | ICD-10-CM | POA: Diagnosis not present

## 2021-07-15 DIAGNOSIS — J329 Chronic sinusitis, unspecified: Secondary | ICD-10-CM | POA: Diagnosis not present

## 2021-07-15 DIAGNOSIS — R531 Weakness: Secondary | ICD-10-CM | POA: Diagnosis not present

## 2021-07-15 DIAGNOSIS — I509 Heart failure, unspecified: Secondary | ICD-10-CM | POA: Diagnosis not present

## 2021-07-15 DIAGNOSIS — J40 Bronchitis, not specified as acute or chronic: Secondary | ICD-10-CM | POA: Diagnosis not present

## 2021-07-20 DIAGNOSIS — J329 Chronic sinusitis, unspecified: Secondary | ICD-10-CM | POA: Diagnosis not present

## 2021-07-20 DIAGNOSIS — J40 Bronchitis, not specified as acute or chronic: Secondary | ICD-10-CM | POA: Diagnosis not present

## 2021-07-20 DIAGNOSIS — Z6824 Body mass index (BMI) 24.0-24.9, adult: Secondary | ICD-10-CM | POA: Diagnosis not present

## 2021-07-20 DIAGNOSIS — R531 Weakness: Secondary | ICD-10-CM | POA: Diagnosis not present

## 2021-07-20 DIAGNOSIS — I509 Heart failure, unspecified: Secondary | ICD-10-CM | POA: Diagnosis not present

## 2021-07-28 DIAGNOSIS — N1832 Chronic kidney disease, stage 3b: Secondary | ICD-10-CM | POA: Diagnosis not present

## 2021-07-28 DIAGNOSIS — E782 Mixed hyperlipidemia: Secondary | ICD-10-CM | POA: Diagnosis not present

## 2021-07-28 DIAGNOSIS — E7849 Other hyperlipidemia: Secondary | ICD-10-CM | POA: Diagnosis not present

## 2021-08-19 DIAGNOSIS — M79672 Pain in left foot: Secondary | ICD-10-CM | POA: Diagnosis not present

## 2021-08-19 DIAGNOSIS — E1151 Type 2 diabetes mellitus with diabetic peripheral angiopathy without gangrene: Secondary | ICD-10-CM | POA: Diagnosis not present

## 2021-08-19 DIAGNOSIS — M79671 Pain in right foot: Secondary | ICD-10-CM | POA: Diagnosis not present

## 2021-08-19 DIAGNOSIS — L609 Nail disorder, unspecified: Secondary | ICD-10-CM | POA: Diagnosis not present

## 2021-08-19 DIAGNOSIS — M79674 Pain in right toe(s): Secondary | ICD-10-CM | POA: Diagnosis not present

## 2021-08-19 DIAGNOSIS — M79675 Pain in left toe(s): Secondary | ICD-10-CM | POA: Diagnosis not present

## 2021-08-21 DIAGNOSIS — H401121 Primary open-angle glaucoma, left eye, mild stage: Secondary | ICD-10-CM | POA: Diagnosis not present

## 2021-09-25 DIAGNOSIS — H04123 Dry eye syndrome of bilateral lacrimal glands: Secondary | ICD-10-CM | POA: Diagnosis not present

## 2021-09-25 DIAGNOSIS — H348111 Central retinal vein occlusion, right eye, with retinal neovascularization: Secondary | ICD-10-CM | POA: Diagnosis not present

## 2021-09-25 DIAGNOSIS — H4051X3 Glaucoma secondary to other eye disorders, right eye, severe stage: Secondary | ICD-10-CM | POA: Diagnosis not present

## 2021-10-21 DIAGNOSIS — Z6824 Body mass index (BMI) 24.0-24.9, adult: Secondary | ICD-10-CM | POA: Diagnosis not present

## 2021-10-21 DIAGNOSIS — Z23 Encounter for immunization: Secondary | ICD-10-CM | POA: Diagnosis not present

## 2021-10-21 DIAGNOSIS — E039 Hypothyroidism, unspecified: Secondary | ICD-10-CM | POA: Diagnosis not present

## 2021-10-21 DIAGNOSIS — Z8679 Personal history of other diseases of the circulatory system: Secondary | ICD-10-CM | POA: Diagnosis not present

## 2021-10-21 DIAGNOSIS — E1122 Type 2 diabetes mellitus with diabetic chronic kidney disease: Secondary | ICD-10-CM | POA: Diagnosis not present

## 2021-10-21 DIAGNOSIS — N1832 Chronic kidney disease, stage 3b: Secondary | ICD-10-CM | POA: Diagnosis not present

## 2021-10-21 DIAGNOSIS — R531 Weakness: Secondary | ICD-10-CM | POA: Diagnosis not present

## 2021-10-21 DIAGNOSIS — I509 Heart failure, unspecified: Secondary | ICD-10-CM | POA: Diagnosis not present

## 2021-10-22 ENCOUNTER — Other Ambulatory Visit: Payer: Self-pay | Admitting: Cardiology

## 2021-10-22 DIAGNOSIS — Z20822 Contact with and (suspected) exposure to covid-19: Secondary | ICD-10-CM | POA: Diagnosis not present

## 2021-10-27 DIAGNOSIS — T148XXA Other injury of unspecified body region, initial encounter: Secondary | ICD-10-CM | POA: Diagnosis not present

## 2021-10-27 DIAGNOSIS — N1832 Chronic kidney disease, stage 3b: Secondary | ICD-10-CM | POA: Diagnosis not present

## 2021-10-27 DIAGNOSIS — E875 Hyperkalemia: Secondary | ICD-10-CM | POA: Diagnosis not present

## 2021-10-28 DIAGNOSIS — M79671 Pain in right foot: Secondary | ICD-10-CM | POA: Diagnosis not present

## 2021-10-28 DIAGNOSIS — M79674 Pain in right toe(s): Secondary | ICD-10-CM | POA: Diagnosis not present

## 2021-10-28 DIAGNOSIS — E1151 Type 2 diabetes mellitus with diabetic peripheral angiopathy without gangrene: Secondary | ICD-10-CM | POA: Diagnosis not present

## 2021-10-28 DIAGNOSIS — M79675 Pain in left toe(s): Secondary | ICD-10-CM | POA: Diagnosis not present

## 2021-10-28 DIAGNOSIS — L609 Nail disorder, unspecified: Secondary | ICD-10-CM | POA: Diagnosis not present

## 2021-10-28 DIAGNOSIS — M79672 Pain in left foot: Secondary | ICD-10-CM | POA: Diagnosis not present

## 2021-11-03 NOTE — Progress Notes (Signed)
? ? ? ? ?HPI:FU coronary artery disease. Patient is status post coronary artery bypass graft at [facility] in 1993. He was admitted to [facility] in January of 2014 with chest pain. He ruled in for a non-ST elevation myocardial infarction. Cardiac catheterization in January of 2014 showed an occluded left main, LAD, circumflex. There was a 95% RCA. LIMA to the LAD was patent. Saphenous vein graft to the obtuse marginal had a 95% lesion. Saphenous vein graft to the diagonal was patent. Saphenous vein graft to the RCA had a 99% stenosis. Patient had PCI of the saphenous vein graft to the right coronary artery and saphenous vein graft to the obtuse marginal with drug-eluting stents. Echocardiogram in January 2014 showed an ejection fraction of 45-50%. The patient also had SVT during hospitalization. He was seen by electrophysiology and placed on amiodarone. Carotid Dopplers March 2017 showed moderate atherosclerosis but no hemodynamically significant internal carotid artery stenosis bilaterally. Patient was admitted October 2021 with acute CHF. Echocardiogram October 2021 showed ejection fraction newly reduced at 25%, mild left ventricular enlargement, mild LVH, grade 2 diastolic dysfunction, biatrial enlargement, mild mitral regurgitation.  Patient was diuresed and medications were adjusted. He was treated medically. Since last seen, he denies increased dyspnea, chest pain, palpitations or syncope.  He has had mild increased pedal edema. ? ?Current Outpatient Medications  ?Medication Sig Dispense Refill  ? amiodarone (PACERONE) 200 MG tablet TAKE 1/2 (ONE-HALF) TABLET BY MOUTH ONCE DAILY (PLEASE  SCHEDULE  AN  APPOINTMENT  FOR  FUTURE  REFILLS) 45 tablet 0  ? aspirin EC 81 MG EC tablet Take 1 tablet (81 mg total) by mouth daily.    ? atorvastatin (LIPITOR) 10 MG tablet Take 10 mg by mouth at bedtime.    ? dorzolamide-timolol (COSOPT) 22.3-6.8 MG/ML ophthalmic solution Place 1 drop into both eyes 2  (two) times daily.    ? doxazosin (CARDURA) 4 MG tablet Take 4 mg by mouth at bedtime.    ? latanoprost (XALATAN) 0.005 % ophthalmic solution Place 1 drop into both eyes every evening.    ? losartan (COZAAR) 50 MG tablet Take 1 tablet by mouth once daily 90 tablet 0  ? spironolactone (ALDACTONE) 25 MG tablet Take 0.5 tablets (12.5 mg total) by mouth daily. 30 tablet 1  ? torsemide (DEMADEX) 20 MG tablet Take one tablet every other day, take one tablet daily for weight gain over 155 lb 30 tablet 1  ? EUTHYROX 75 MCG tablet Take 75 mcg by mouth daily.    ? nitroGLYCERIN (NITROSTAT) 0.4 MG SL tablet Place 1 tablet (0.4 mg total) under the tongue every 5 (five) minutes x 3 doses as needed for chest pain. (Patient not taking: Reported on 11/06/2021) 60 tablet 3  ? potassium chloride SA (KLOR-CON) 20 MEQ tablet Take 1 tablet (20 mEq total) by mouth daily. (Patient not taking: Reported on 11/06/2021) 30 tablet 1  ? ?No current facility-administered medications for this visit.  ? ? ? ?Past Medical History:  ?Diagnosis Date  ? Bifascicular block   ? Coronary artery disease   ? CABG 1993 at [facility]; NSTEMI 09/2012 s/p DES to SVG-OM1, DES to SVG-PDA   ? DM (diabetes mellitus) ([place])   ? HTN (hypertension)   ? SVT (supraventricular tachycardia) ([place])   ? ? ?Past Surgical History:  ?Procedure Laterality Date  ? CARDIAC SURGERY    ? 1993 CABG  ? LEFT HEART CATHETERIZATION WITH CORONARY/GRAFT ANGIOGRAM N/A 10/04/2012  ? Procedure: LEFT HEART  CATHETERIZATION WITH Beatrix Fetters;  Surgeon: Sherren Mocha, MD;  Location: [facility] CATH LAB;  Service: Cardiovascular;  Laterality: N/A;  ? ? ?Social History  ? ?Socioeconomic History  ? Marital status: Widowed  ?  Spouse name: Not on file  ? Number of children: Not on file  ? Years of education: college- BS  ? Highest education level: Bachelor's degree (e.g., BA, AB, BS)  ?Occupational History  ? Occupation: retired  ?  Comment: was in [employer]  ?Tobacco Use  ? Smoking status:  Never  ? Smokeless tobacco: Never  ?Vaping Use  ? Vaping Use: Never used  ?Substance and Sexual Activity  ? Alcohol use: No  ?  Alcohol/week: 0.0 standard drinks  ? Drug use: No  ? Sexual activity: Not on file  ?Other Topics Concern  ? Not on file  ?Social History Narrative  ? wife passed in January 2021  ? son Iona Beard is the primary care giver prn  ? Retired from [employer] with a BS degree  ? ?Social Determinants of Health  ? ?Financial Resource Strain: Not on file  ?Food Insecurity: Not on file  ?Transportation Needs: Not on file  ?Physical Activity: Not on file  ?Stress: Not on file  ?Social Connections: Not on file  ?Intimate Partner Violence: Not on file  ? ? ?Family History  ?Problem Relation Age of Onset  ? CAD Other   ?     multiple relatives including father and son  ? ? ?ROS: no fevers or chills, productive cough, hemoptysis, dysphasia, odynophagia, melena, hematochezia, dysuria, hematuria, rash, seizure activity, orthopnea, PND, claudication. Remaining systems are negative. ? ?Physical Exam: ?Well-developed well-nourished in no acute distress.  ?Skin is warm and dry.  ?HEENT is normal.  ?Neck is supple.  ?Chest is clear to auscultation with normal expansion.  ?Cardiovascular exam is regular rate and rhythm.  ?Abdominal exam nontender or distended. No masses palpated. ?Extremities show no edema. ?neuro grossly intact ? ?ECG-normal sinus rhythm with first-degree AV block, right bundle branch block, left anterior fasicular block.  Personally reviewed ? ?A/P ? ?1 coronary artery disease status post coronary artery bypass graft-patient denies chest pain.  Plan to continue medical therapy with aspirin and statin. ? ?2 cardiomyopathy-given patient's age we have elected to be conservative and not pursue ischemia evaluation.  Plan to continue ARB (he did not tolerate Entresto).  He is not on a beta-blocker given history of bradycardia. ? ?3 chronic systolic congestive heart failure-patient with increased pedal  edema.  I will increase his Demadex to 20 mg every other day.  In 1 week check potassium and renal function.  Continue low-dose spironolactone. ? ?4 hypertension-patient's blood pressure is controlled today.  Continue present medical regimen. ? ?5 hyperlipidemia-continue statin.  Check lipids and liver. ? ?6 SVT-patient has had no recurrences based on history.  Continue amiodarone.  Check chest x-ray, liver functions and TSH/free T4. ? ?7 carotid artery disease-mild on most recent Dopplers. ? ?Kirk Ruths, MD ? ? ? ?

## 2021-11-06 ENCOUNTER — Other Ambulatory Visit: Payer: Self-pay

## 2021-11-06 ENCOUNTER — Encounter: Payer: Self-pay | Admitting: Cardiology

## 2021-11-06 ENCOUNTER — Ambulatory Visit (INDEPENDENT_AMBULATORY_CARE_PROVIDER_SITE_OTHER): Payer: Medicare Other | Admitting: Cardiology

## 2021-11-06 VITALS — BP 100/66 | HR 62 | Ht 71.0 in | Wt 148.6 lb

## 2021-11-06 DIAGNOSIS — I2510 Atherosclerotic heart disease of native coronary artery without angina pectoris: Secondary | ICD-10-CM | POA: Diagnosis not present

## 2021-11-06 DIAGNOSIS — I10 Essential (primary) hypertension: Secondary | ICD-10-CM

## 2021-11-06 DIAGNOSIS — Z9229 Personal history of other drug therapy: Secondary | ICD-10-CM

## 2021-11-06 DIAGNOSIS — N1832 Chronic kidney disease, stage 3b: Secondary | ICD-10-CM | POA: Diagnosis not present

## 2021-11-06 DIAGNOSIS — I2583 Coronary atherosclerosis due to lipid rich plaque: Secondary | ICD-10-CM

## 2021-11-06 DIAGNOSIS — I5021 Acute systolic (congestive) heart failure: Secondary | ICD-10-CM | POA: Diagnosis not present

## 2021-11-06 DIAGNOSIS — E7800 Pure hypercholesterolemia, unspecified: Secondary | ICD-10-CM | POA: Diagnosis not present

## 2021-11-06 DIAGNOSIS — I471 Supraventricular tachycardia: Secondary | ICD-10-CM | POA: Diagnosis not present

## 2021-11-06 MED ORDER — TORSEMIDE 20 MG PO TABS: ORAL_TABLET | ORAL | 3 refills | Status: DC

## 2021-11-06 NOTE — Addendum Note (Signed)
Addended by: Derenda Fennel on: 11/06/2021 01:49 PM ? ? Modules accepted: Orders ? ?

## 2021-11-06 NOTE — Addendum Note (Signed)
Addended by: Orma Render on: 11/06/2021 01:50 PM ? ? Modules accepted: Orders ? ?

## 2021-11-06 NOTE — Addendum Note (Signed)
Addended by: Derenda Fennel on: 11/06/2021 01:47 PM ? ? Modules accepted: Orders ? ?

## 2021-11-06 NOTE — Patient Instructions (Signed)
Medication Instructions:  ?INCREASE Torsemide to 20 mg every other day  ?*If you need a refill on your cardiac medications before your next appointment, please call your pharmacy* ? ? ?Lab Work: ?Your physician recommends that you return for lab work in 1 week :  ?Fasting Lipid Panel  ?BMET ?TSH+Free T4 ?Liver Function  ?If you have labs (blood work) drawn today and your tests are completely normal, you will receive your results only by: ?MyChart Message (if you have MyChart) OR ?A paper copy in the mail ?If you have any lab test that is abnormal or we need to change your treatment, we will call you to review the results. ? ? ?Testing/Procedures: ?A chest x-ray takes a picture of the organs and structures inside the chest, including the heart, lungs, and blood vessels. This test can show several things, including, whether the heart is enlarges; whether fluid is building up in the lungs; and whether pacemaker / defibrillator leads are still in place. ? ? ? ?Follow-Up: ?At [facility], you and your health needs are our priority.  As part of our continuing mission to provide you with exceptional heart care, we have created designated Provider Care Teams.  These Care Teams include your primary Cardiologist (physician) and Advanced Practice Providers (APPs -  Physician Assistants and Nurse Practitioners) who all work together to provide you with the care you need, when you need it. ? ?We recommend signing up for the patient portal called "MyChart".  Sign up information is provided on this After Visit Summary.  MyChart is used to connect with patients for Virtual Visits (Telemedicine).  Patients are able to view lab/test results, encounter notes, upcoming appointments, etc.  Non-urgent messages can be sent to your provider as well.   ?To learn more about what you can do with MyChart, go to [email or web address].   ? ?Your next appointment:   ?6 month(s) ? ?The format for your next appointment:   ?In  Person ? ?Provider:   ?Olga Millers, MD   ? ? ?Other Instructions ?Elevate your legs when sitting for periods of time  ? ?

## 2021-11-10 ENCOUNTER — Telehealth: Payer: Self-pay | Admitting: Cardiology

## 2021-11-10 DIAGNOSIS — N1832 Chronic kidney disease, stage 3b: Secondary | ICD-10-CM | POA: Diagnosis not present

## 2021-11-10 DIAGNOSIS — I2510 Atherosclerotic heart disease of native coronary artery without angina pectoris: Secondary | ICD-10-CM | POA: Diagnosis not present

## 2021-11-10 DIAGNOSIS — I2583 Coronary atherosclerosis due to lipid rich plaque: Secondary | ICD-10-CM | POA: Diagnosis not present

## 2021-11-10 DIAGNOSIS — E7800 Pure hypercholesterolemia, unspecified: Secondary | ICD-10-CM | POA: Diagnosis not present

## 2021-11-10 DIAGNOSIS — I471 Supraventricular tachycardia: Secondary | ICD-10-CM | POA: Diagnosis not present

## 2021-11-10 NOTE — Telephone Encounter (Signed)
Patient's son returning call. 

## 2021-11-10 NOTE — Telephone Encounter (Signed)
Returned call to patient's son [name] left message on personal voice mail to call back. ?

## 2021-11-10 NOTE — Telephone Encounter (Signed)
Received a call from patient's son Franklin Gonzalez.He stated he would like father to have lab work and cxr ordered by [employer] to be done at PCP Dr.Williams at [facility] in [place].Advised I will fax orders attention Arline Asp to fax # [phone].Advised to fax results to Dr.Crenshaw. ?

## 2021-11-10 NOTE — Telephone Encounter (Signed)
Pt son called in and stated he would like the order for the blood work and Xray sent to: ? ?[facility].  This is pt PCP ?[phone] ? ?He stated it hard to get him to [place] because pt being 48 and wife is going to have cancer surgery  ? ?Best number son [phone] ?

## 2021-11-11 ENCOUNTER — Telehealth: Payer: Self-pay | Admitting: Cardiology

## 2021-11-11 NOTE — Telephone Encounter (Signed)
call from patient's son [name]. Stating father is to have lab work and cxr ordered by [employer] to be done at PCP Dr.Williams at [facility] in [place].. following up to see if nurse Elnita Maxwell completed this yesterday...please advise  ?

## 2021-11-11 NOTE — Telephone Encounter (Signed)
Per phone note yesterday- orders have been faxed for CXR, and labs per [facility] LPN.  ? ?Attempted to call patient's son to let him know but unable to reach or leave message at this time.  ?

## 2021-11-18 NOTE — Telephone Encounter (Signed)
Spoke with [facility], they are having trouble with their fax machine and ask that we keep trying. ?

## 2021-11-18 NOTE — Telephone Encounter (Signed)
Son is returning call.  ? ?Son is frustrated that its taking so long to get done. He states that [facility] did the blood work but couldn't do the xray because didn't have correct paperwork. Please advise ?

## 2021-11-18 NOTE — Telephone Encounter (Signed)
Spoke with Myra Gianotti, he states that he took pt to [facility] in [place] and he states that they did not received faxed lab and chest xray orders faxed to them on 3-7. Apologized to Son and assured him that I will fax the CXR order again today. ?Called [facility] and spoke with [facility] informed of above.She will check the fax for our orders. ?Faxed orders. ?

## 2021-11-18 NOTE — Telephone Encounter (Signed)
Fleet Contras from [facility] states they still have not received the faxed orders. Fax: [phone] ?

## 2021-11-18 NOTE — Telephone Encounter (Signed)
Attempted to call patient's son, left message for patient's son to call back to office.  ? ?

## 2021-11-26 NOTE — Telephone Encounter (Signed)
[facility] called in regards to this chest xray order. Advised her of documentation made today that order is being sent via mail.  ?

## 2021-11-26 NOTE — Telephone Encounter (Signed)
Left message for patients son, [facility] fax machine is not answering. Will mail the chest xray order. ?

## 2021-11-26 NOTE — Telephone Encounter (Signed)
?  Pt's son calling back, he said [facility] still not receive the order for pt's xray. He is requesting if the order can be email to him instead at [email or web address] ?

## 2021-12-17 NOTE — Telephone Encounter (Signed)
Franklin Gonzalez did not receive the CXR email. She corrected her email address as ([email or web address]). She did not get the email. Informed her that patient's son has the CXR order to bring to them. ?

## 2021-12-17 NOTE — Telephone Encounter (Signed)
? ?  Pt's son said, [facility] did not receive order for xray. Pt's son said he will just pick it up at the office, he said he is on his way and be at the office in 30 mins ?

## 2021-12-17 NOTE — Telephone Encounter (Signed)
Spoke with Franklin Gonzalez at [facility] regarding CXR order. She gave me her email address to send the order: [email or web address]. She stated they will accept Dr. Ludwig Clarks stamped name on the order. Called to verify if she received the email. LMTCB. ?Patient's son Aarnav Steagall came for a hard copy of the CXR order. Order given to him after he provided ID. ?

## 2021-12-21 ENCOUNTER — Other Ambulatory Visit: Payer: Self-pay | Admitting: Cardiology

## 2021-12-22 DIAGNOSIS — Z20822 Contact with and (suspected) exposure to covid-19: Secondary | ICD-10-CM | POA: Diagnosis not present

## 2021-12-23 DIAGNOSIS — Z9229 Personal history of other drug therapy: Secondary | ICD-10-CM | POA: Diagnosis not present

## 2021-12-30 DIAGNOSIS — H4051X3 Glaucoma secondary to other eye disorders, right eye, severe stage: Secondary | ICD-10-CM | POA: Diagnosis not present

## 2021-12-30 DIAGNOSIS — S0501XA Injury of conjunctiva and corneal abrasion without foreign body, right eye, initial encounter: Secondary | ICD-10-CM | POA: Diagnosis not present

## 2021-12-30 DIAGNOSIS — H04123 Dry eye syndrome of bilateral lacrimal glands: Secondary | ICD-10-CM | POA: Diagnosis not present

## 2021-12-30 DIAGNOSIS — H348111 Central retinal vein occlusion, right eye, with retinal neovascularization: Secondary | ICD-10-CM | POA: Diagnosis not present

## 2022-01-02 ENCOUNTER — Other Ambulatory Visit: Payer: Self-pay | Admitting: Cardiology

## 2022-01-06 DIAGNOSIS — H401121 Primary open-angle glaucoma, left eye, mild stage: Secondary | ICD-10-CM | POA: Diagnosis not present

## 2022-01-06 DIAGNOSIS — H4051X3 Glaucoma secondary to other eye disorders, right eye, severe stage: Secondary | ICD-10-CM | POA: Diagnosis not present

## 2022-01-06 DIAGNOSIS — S0501XA Injury of conjunctiva and corneal abrasion without foreign body, right eye, initial encounter: Secondary | ICD-10-CM | POA: Diagnosis not present

## 2022-01-06 DIAGNOSIS — H16223 Keratoconjunctivitis sicca, not specified as Sjogren's, bilateral: Secondary | ICD-10-CM | POA: Diagnosis not present

## 2022-01-20 DIAGNOSIS — M79675 Pain in left toe(s): Secondary | ICD-10-CM | POA: Diagnosis not present

## 2022-01-20 DIAGNOSIS — M79674 Pain in right toe(s): Secondary | ICD-10-CM | POA: Diagnosis not present

## 2022-01-20 DIAGNOSIS — M79671 Pain in right foot: Secondary | ICD-10-CM | POA: Diagnosis not present

## 2022-01-20 DIAGNOSIS — E1122 Type 2 diabetes mellitus with diabetic chronic kidney disease: Secondary | ICD-10-CM | POA: Diagnosis not present

## 2022-01-20 DIAGNOSIS — E039 Hypothyroidism, unspecified: Secondary | ICD-10-CM | POA: Diagnosis not present

## 2022-01-20 DIAGNOSIS — I10 Essential (primary) hypertension: Secondary | ICD-10-CM | POA: Diagnosis not present

## 2022-01-20 DIAGNOSIS — M79672 Pain in left foot: Secondary | ICD-10-CM | POA: Diagnosis not present

## 2022-01-20 DIAGNOSIS — E1151 Type 2 diabetes mellitus with diabetic peripheral angiopathy without gangrene: Secondary | ICD-10-CM | POA: Diagnosis not present

## 2022-01-20 DIAGNOSIS — L609 Nail disorder, unspecified: Secondary | ICD-10-CM | POA: Diagnosis not present

## 2022-01-26 DIAGNOSIS — E039 Hypothyroidism, unspecified: Secondary | ICD-10-CM | POA: Diagnosis not present

## 2022-01-26 DIAGNOSIS — I509 Heart failure, unspecified: Secondary | ICD-10-CM | POA: Diagnosis not present

## 2022-01-26 DIAGNOSIS — Z8679 Personal history of other diseases of the circulatory system: Secondary | ICD-10-CM | POA: Diagnosis not present

## 2022-01-26 DIAGNOSIS — Z6829 Body mass index (BMI) 29.0-29.9, adult: Secondary | ICD-10-CM | POA: Diagnosis not present

## 2022-01-26 DIAGNOSIS — I10 Essential (primary) hypertension: Secondary | ICD-10-CM | POA: Diagnosis not present

## 2022-01-26 DIAGNOSIS — E1122 Type 2 diabetes mellitus with diabetic chronic kidney disease: Secondary | ICD-10-CM | POA: Diagnosis not present

## 2022-01-26 DIAGNOSIS — R531 Weakness: Secondary | ICD-10-CM | POA: Diagnosis not present

## 2022-02-05 DIAGNOSIS — N1832 Chronic kidney disease, stage 3b: Secondary | ICD-10-CM | POA: Diagnosis not present

## 2022-02-09 DIAGNOSIS — R531 Weakness: Secondary | ICD-10-CM | POA: Diagnosis not present

## 2022-02-09 DIAGNOSIS — E1122 Type 2 diabetes mellitus with diabetic chronic kidney disease: Secondary | ICD-10-CM | POA: Diagnosis not present

## 2022-02-09 DIAGNOSIS — Z6830 Body mass index (BMI) 30.0-30.9, adult: Secondary | ICD-10-CM | POA: Diagnosis not present

## 2022-02-09 DIAGNOSIS — I509 Heart failure, unspecified: Secondary | ICD-10-CM | POA: Diagnosis not present

## 2022-02-09 DIAGNOSIS — Z8679 Personal history of other diseases of the circulatory system: Secondary | ICD-10-CM | POA: Diagnosis not present

## 2022-02-09 DIAGNOSIS — E039 Hypothyroidism, unspecified: Secondary | ICD-10-CM | POA: Diagnosis not present

## 2022-02-09 DIAGNOSIS — I10 Essential (primary) hypertension: Secondary | ICD-10-CM | POA: Diagnosis not present

## 2022-02-09 DIAGNOSIS — N1832 Chronic kidney disease, stage 3b: Secondary | ICD-10-CM | POA: Diagnosis not present

## 2022-02-19 DIAGNOSIS — H04123 Dry eye syndrome of bilateral lacrimal glands: Secondary | ICD-10-CM | POA: Diagnosis not present

## 2022-02-19 DIAGNOSIS — H16223 Keratoconjunctivitis sicca, not specified as Sjogren's, bilateral: Secondary | ICD-10-CM | POA: Diagnosis not present

## 2022-02-23 DIAGNOSIS — I509 Heart failure, unspecified: Secondary | ICD-10-CM | POA: Diagnosis not present

## 2022-02-23 DIAGNOSIS — R030 Elevated blood-pressure reading, without diagnosis of hypertension: Secondary | ICD-10-CM | POA: Diagnosis not present

## 2022-02-23 DIAGNOSIS — Z8679 Personal history of other diseases of the circulatory system: Secondary | ICD-10-CM | POA: Diagnosis not present

## 2022-02-23 DIAGNOSIS — E039 Hypothyroidism, unspecified: Secondary | ICD-10-CM | POA: Diagnosis not present

## 2022-02-23 DIAGNOSIS — E1122 Type 2 diabetes mellitus with diabetic chronic kidney disease: Secondary | ICD-10-CM | POA: Diagnosis not present

## 2022-02-23 DIAGNOSIS — R531 Weakness: Secondary | ICD-10-CM | POA: Diagnosis not present

## 2022-02-23 DIAGNOSIS — Z6829 Body mass index (BMI) 29.0-29.9, adult: Secondary | ICD-10-CM | POA: Diagnosis not present

## 2022-03-02 ENCOUNTER — Encounter: Payer: Self-pay | Admitting: Dermatology

## 2022-03-02 ENCOUNTER — Ambulatory Visit (INDEPENDENT_AMBULATORY_CARE_PROVIDER_SITE_OTHER): Payer: Medicare Other | Admitting: Dermatology

## 2022-03-02 DIAGNOSIS — S51812A Laceration without foreign body of left forearm, initial encounter: Secondary | ICD-10-CM

## 2022-03-02 DIAGNOSIS — L570 Actinic keratosis: Secondary | ICD-10-CM

## 2022-03-02 DIAGNOSIS — Z85828 Personal history of other malignant neoplasm of skin: Secondary | ICD-10-CM | POA: Diagnosis not present

## 2022-03-02 DIAGNOSIS — I2583 Coronary atherosclerosis due to lipid rich plaque: Secondary | ICD-10-CM

## 2022-03-02 DIAGNOSIS — Z1283 Encounter for screening for malignant neoplasm of skin: Secondary | ICD-10-CM | POA: Diagnosis not present

## 2022-03-02 DIAGNOSIS — I2510 Atherosclerotic heart disease of native coronary artery without angina pectoris: Secondary | ICD-10-CM

## 2022-03-02 DIAGNOSIS — L821 Other seborrheic keratosis: Secondary | ICD-10-CM | POA: Diagnosis not present

## 2022-03-23 DIAGNOSIS — Z8679 Personal history of other diseases of the circulatory system: Secondary | ICD-10-CM | POA: Diagnosis not present

## 2022-03-23 DIAGNOSIS — I509 Heart failure, unspecified: Secondary | ICD-10-CM | POA: Diagnosis not present

## 2022-03-23 DIAGNOSIS — R531 Weakness: Secondary | ICD-10-CM | POA: Diagnosis not present

## 2022-03-23 DIAGNOSIS — N1832 Chronic kidney disease, stage 3b: Secondary | ICD-10-CM | POA: Diagnosis not present

## 2022-03-23 DIAGNOSIS — E1122 Type 2 diabetes mellitus with diabetic chronic kidney disease: Secondary | ICD-10-CM | POA: Diagnosis not present

## 2022-03-23 DIAGNOSIS — E039 Hypothyroidism, unspecified: Secondary | ICD-10-CM | POA: Diagnosis not present

## 2022-03-23 DIAGNOSIS — Z6830 Body mass index (BMI) 30.0-30.9, adult: Secondary | ICD-10-CM | POA: Diagnosis not present

## 2022-03-23 DIAGNOSIS — R030 Elevated blood-pressure reading, without diagnosis of hypertension: Secondary | ICD-10-CM | POA: Diagnosis not present

## 2022-03-29 ENCOUNTER — Encounter: Payer: Self-pay | Admitting: Dermatology

## 2022-03-29 NOTE — Progress Notes (Signed)
   New Patient   Subjective  Franklin Gonzalez is a 88 y.o. male who presents for the following: New Patient (Initial Visit) and Annual Exam (Full body skin, no personal history of skin skin cacner).  General skin check, prone to tears on his forearms. Location:  Duration:  Quality:  Associated Signs/Symptoms: Modifying Factors:  Severity:  Timing: Context:    The following portions of the chart were reviewed this encounter and updated as appropriate:  Tobacco  Allergies  Meds  Problems  Med Hx  Surg Hx  Fam Hx      Objective  Well appearing patient in no apparent distress; mood and affect are within normal limits. No atypical nevi or signs of NMSC noted at the time of the visit. Skin tear on the left forearm, keratoses on the face ans torso.  Right Ear Right ear rim: Hornlike 4 mm pink crust    All skin waist up examined.   Assessment & Plan  Screening exam for skin cancer  Yearly skin exams to as needed  AK (actinic keratosis) Right Ear  Return if lesion is still present 10 weeks  Destruction of lesion - Right Ear Complexity: simple   Destruction method: cryotherapy   Informed consent: discussed and consent obtained   Timeout:  patient name, date of birth, surgical site, and procedure verified Lesion destroyed using liquid nitrogen: Yes   Cryotherapy cycles:  3 Outcome: patient tolerated procedure well with no complications

## 2022-04-07 DIAGNOSIS — H4051X3 Glaucoma secondary to other eye disorders, right eye, severe stage: Secondary | ICD-10-CM | POA: Diagnosis not present

## 2022-04-07 DIAGNOSIS — H401131 Primary open-angle glaucoma, bilateral, mild stage: Secondary | ICD-10-CM | POA: Diagnosis not present

## 2022-04-07 DIAGNOSIS — H04123 Dry eye syndrome of bilateral lacrimal glands: Secondary | ICD-10-CM | POA: Diagnosis not present

## 2022-04-07 DIAGNOSIS — H348111 Central retinal vein occlusion, right eye, with retinal neovascularization: Secondary | ICD-10-CM | POA: Diagnosis not present

## 2022-04-15 ENCOUNTER — Encounter: Payer: Self-pay | Admitting: *Deleted

## 2022-04-15 ENCOUNTER — Other Ambulatory Visit: Payer: Self-pay | Admitting: *Deleted

## 2022-04-15 NOTE — Patient Outreach (Signed)
  Care Coordination   Initial Visit Note   04/15/2022 Name: Franklin Gonzalez MRN: [id number] DOB: May 05, 1922  Franklin Gonzalez is a 88 y.o. year old male who sees Donetta Potts, MD for primary care. I spoke with [name], son of Damaris Geers by phone today  What matters to the patients health and wellness today?  Managing knee pain.  Feels having PT would help with mobility, also may be interested in seeing ortho specialist.  Has paid caregivers every AM and PM in the home with him to provider meals and housework.    Call placed to PCP office, spoke with [facility]. Advised to son's request for PT.  Also discussed need for AWV, voice message left for Ann.     Goals Addressed               This Visit's Progress     Per son, Start PT to help with knee pain (pt-stated)        Care Coordination Interventions: Discussed importance of adherence to all scheduled medical appointments Advised patient to discuss HHPT options as well as referral to ortho for knee pain with provider Assessed social determinant of health barriers         SDOH assessments and interventions completed:  Yes  SDOH Interventions Today    Flowsheet Row Most Recent Value  SDOH Interventions   Food Insecurity Interventions Intervention Not Indicated  Housing Interventions Intervention Not Indicated  Transportation Interventions Intervention Not Indicated        Care Coordination Interventions Activated:  Yes  Care Coordination Interventions:  Yes, provided   Follow up plan: Follow up call scheduled for 8/25    Encounter Outcome:  Pt. Visit Completed   Kemper Durie, RN, MSN, [facility] Care Coordinator [phone]

## 2022-04-28 NOTE — Progress Notes (Addendum)
HPI: FU coronary artery disease. Patient is status post coronary artery bypass graft at [facility] in 1993. He was admitted to [facility] in January of 2014 with chest pain. He ruled in for a non-ST elevation myocardial infarction. Cardiac catheterization in January of 2014 showed an occluded left main, LAD, circumflex. There was a 95% RCA. LIMA to the LAD was patent. Saphenous vein graft to the obtuse marginal had a 95% lesion. Saphenous vein graft to the diagonal was patent. Saphenous vein graft to the RCA had a 99% stenosis. Patient had PCI of the saphenous vein graft to the right coronary artery and saphenous vein graft to the obtuse marginal with drug-eluting stents. Echocardiogram in January 2014 showed an ejection fraction of 45-50%. The patient also had SVT during hospitalization. He was seen by electrophysiology and placed on amiodarone. Carotid Dopplers March 2017 showed moderate atherosclerosis but no hemodynamically significant internal carotid artery stenosis bilaterally. Patient was admitted October 2021 with acute CHF. Echocardiogram October 2021 showed ejection fraction newly reduced at 25%, mild left ventricular enlargement, mild LVH, grade 2 diastolic dysfunction, biatrial enlargement, mild mitral regurgitation.  Patient was diuresed and medications were adjusted. He was treated medically. Since last seen, he denies increased dyspnea, chest pain, palpitations or syncope.  He did fall recently and has some residual pelvic pain.  Current Outpatient Medications  Medication Sig Dispense Refill   amiodarone (PACERONE) 200 MG tablet Take 1/2 (one-half) tablet by mouth once daily 45 tablet 3   aspirin EC 81 MG EC tablet Take 1 tablet (81 mg total) by mouth daily.     atorvastatin (LIPITOR) 10 MG tablet Take 10 mg by mouth at bedtime.     dorzolamide-timolol (COSOPT) 22.3-6.8 MG/ML ophthalmic solution Place 1 drop into both eyes 2 (two) times daily.     doxazosin (CARDURA)  4 MG tablet Take 2 mg by mouth at bedtime.     EUTHYROX 75 MCG tablet Take 75 mcg by mouth daily.     latanoprost (XALATAN) 0.005 % ophthalmic solution Place 1 drop into both eyes every evening.     levothyroxine (SYNTHROID) 88 MCG tablet Take 88 mcg by mouth daily.     losartan (COZAAR) 50 MG tablet Take 1 tablet by mouth once daily 90 tablet 1   nitroGLYCERIN (NITROSTAT) 0.4 MG SL tablet Place 1 tablet (0.4 mg total) under the tongue every 5 (five) minutes x 3 doses as needed for chest pain. 60 tablet 3   potassium chloride SA (KLOR-CON) 20 MEQ tablet Take 1 tablet (20 mEq total) by mouth daily. 30 tablet 1   spironolactone (ALDACTONE) 25 MG tablet Take 0.5 tablets (12.5 mg total) by mouth daily. 30 tablet 1   torsemide (DEMADEX) 20 MG tablet Take one tablet every other day 45 tablet 3   No current facility-administered medications for this visit.     Past Medical History:  Diagnosis Date   Bifascicular block    Coronary artery disease    CABG [facility] at [facility]; NSTEMI 09/2012 s/p DES to SVG-OM1, DES to SVG-PDA    DM (diabetes mellitus) (HCC)    HTN (hypertension)    SVT (supraventricular tachycardia) (HCC)     Past Surgical History:  Procedure Laterality Date   CARDIAC SURGERY     1993 CABG   LEFT HEART CATHETERIZATION WITH CORONARY/GRAFT ANGIOGRAM N/A 10/04/2012   Procedure: LEFT HEART CATHETERIZATION WITH Isabel Caprice;  Surgeon: Tonny Bollman, MD;  Location: [facility] CATH LAB;  Service:  Cardiovascular;  Laterality: N/A;    Social History   Socioeconomic History   Marital status: Widowed    Spouse name: Not on file   Number of children: Not on file   Years of education: college- BS   Highest education level: Bachelor's degree (e.g., BA, AB, BS)  Occupational History   Occupation: retired    Comment: was in [employer]  Tobacco Use   Smoking status: Never   Smokeless tobacco: Never  Vaping Use   Vaping Use: Never used  Substance and Sexual Activity    Alcohol use: No    Alcohol/week: 0.0 standard drinks of alcohol   Drug use: No   Sexual activity: Not on file  Other Topics Concern   Not on file  Social History Narrative   wife passed in January 2021   son Iona Beard is the primary care giver prn   Retired from [employer] with a BS degree   Social Determinants of Health   Financial Resource Strain: Low Risk  (07/25/2020)   Overall Financial Resource Strain (CARDIA)    Difficulty of Paying Living Expenses: Not hard at all  Food Insecurity: No Food Insecurity (04/15/2022)   Hunger Vital Sign    Worried About Running Out of Food in the Last Year: Never true    [place] in the Last Year: Never true  Transportation Needs: No Transportation Needs (04/15/2022)   PRAPARE - Hydrologist (Medical): No    Lack of Transportation (Non-Medical): No  Physical Activity: Not on file  Stress: No Stress Concern Present (07/25/2020)   [place]    Feeling of Stress : Not at all  Social Connections: Moderately Integrated (07/25/2020)   Social Connection and Isolation Panel [NHANES]    Frequency of Communication with Friends and Family: More than three times a week    Frequency of Social Gatherings with Friends and Family: More than three times a week    Attends Religious Services: 1 to 4 times per year    Active Member of [employer] or Organizations: Yes    Attends Archivist Meetings: 1 to 4 times per year    Marital Status: Widowed  Intimate Partner Violence: Not At Risk (10/31/2020)   Humiliation, Afraid, Rape, and Kick questionnaire    Fear of Current or Ex-Partner: No    Emotionally Abused: No    Physically Abused: No    Sexually Abused: No    Family History  Problem Relation Age of Onset   CAD Other        multiple relatives including father and son    ROS: Pelvic pain but no fevers or chills, productive cough, hemoptysis,  dysphasia, odynophagia, melena, hematochezia, dysuria, hematuria, rash, seizure activity, orthopnea, PND, claudication. Remaining systems are negative.  Physical Exam: Well-developed frail in no acute distress.  Skin is warm and dry.  HEENT is normal.  Neck is supple.  Chest is clear to auscultation with normal expansion.  Cardiovascular exam is regular rate and rhythm.  Abdominal exam nontender or distended. No masses palpated. Extremities show trace to 1+ edema. neuro grossly intact  ECG-normal sinus rhythm with first-degree AV block, occasional PVC, left anterior fascicular block, right bundle branch block.  Personally reviewed  A/P  1 coronary artery disease status post coronary artery bypass and graft-patient continues to do well with no chest pain.  Continue aspirin and statin.  2 cardiomyopathy-as outlined in previous  notes given his age we have elected to be conservative and treat medically.  Continue ARB (he did not tolerate Entresto).  No beta-blockade given history of bradycardia.  3 chronic systolic congestive heart failure-continue Demadex and spironolactone at present dose.  Check potassium and renal function.  4 hypertension-blood pressure controlled.  Continue present medications.  5 hyperlipidemia-continue statin.  6 SVT-no recurrences.  Continue amiodarone.  Laboratories reviewed from March 2023.  Liver functions and TSH normal.  7 carotid artery disease-mild on most recent Dopplers.  Olga Millers, MD

## 2022-04-30 ENCOUNTER — Ambulatory Visit: Payer: Self-pay | Admitting: *Deleted

## 2022-04-30 NOTE — Patient Outreach (Signed)
  Care Coordination   Follow Up Visit Note   04/30/2022 Name: Franklin Gonzalez MRN: [id number] DOB: 1922/08/30  Franklin Gonzalez is a 88 y.o. year old male who sees Donetta Potts, MD for primary care. I spoke with  Franklin Gonzalez by phone today.  What matters to the patients health and wellness today?  Now monitoring blood sugar more closely, adhering to appropriate diet.  Member continues to have knee pain, decreasing mobility.  Son still interested in PT.    Goals Addressed               This Visit's Progress     Per son, Start PT to help with knee pain (pt-stated)   On track     Care Coordination Interventions: Discussed importance of adherence to all scheduled medical appointments Advised patient to discuss HHPT options as well as referral to ortho for knee pain with provider Assessed social determinant of health barriers  8/25 - Son has not heard back from PCP office regarding PT orders.  Call placed to PCP office, spoke to [place], request order/referral be placed to agency of choice, [facility].         SDOH assessments and interventions completed:  No     Care Coordination Interventions Activated:  Yes  Care Coordination Interventions:  Yes, provided   Follow up plan: Follow up call scheduled for 9/25    Encounter Outcome:  Pt. Visit Completed   Kemper Durie, RN, MSN, [facility] Care Management Care Management Coordinator [phone]

## 2022-05-06 DIAGNOSIS — L609 Nail disorder, unspecified: Secondary | ICD-10-CM | POA: Diagnosis not present

## 2022-05-06 DIAGNOSIS — M79674 Pain in right toe(s): Secondary | ICD-10-CM | POA: Diagnosis not present

## 2022-05-06 DIAGNOSIS — E1151 Type 2 diabetes mellitus with diabetic peripheral angiopathy without gangrene: Secondary | ICD-10-CM | POA: Diagnosis not present

## 2022-05-06 DIAGNOSIS — M79672 Pain in left foot: Secondary | ICD-10-CM | POA: Diagnosis not present

## 2022-05-06 DIAGNOSIS — M79671 Pain in right foot: Secondary | ICD-10-CM | POA: Diagnosis not present

## 2022-05-06 DIAGNOSIS — M79675 Pain in left toe(s): Secondary | ICD-10-CM | POA: Diagnosis not present

## 2022-05-11 DIAGNOSIS — I509 Heart failure, unspecified: Secondary | ICD-10-CM | POA: Diagnosis not present

## 2022-05-11 DIAGNOSIS — M533 Sacrococcygeal disorders, not elsewhere classified: Secondary | ICD-10-CM | POA: Diagnosis not present

## 2022-05-11 DIAGNOSIS — Z6828 Body mass index (BMI) 28.0-28.9, adult: Secondary | ICD-10-CM | POA: Diagnosis not present

## 2022-05-11 DIAGNOSIS — N189 Chronic kidney disease, unspecified: Secondary | ICD-10-CM | POA: Diagnosis not present

## 2022-05-11 DIAGNOSIS — I4891 Unspecified atrial fibrillation: Secondary | ICD-10-CM | POA: Diagnosis not present

## 2022-05-11 DIAGNOSIS — M549 Dorsalgia, unspecified: Secondary | ICD-10-CM | POA: Diagnosis not present

## 2022-05-11 DIAGNOSIS — R030 Elevated blood-pressure reading, without diagnosis of hypertension: Secondary | ICD-10-CM | POA: Diagnosis not present

## 2022-05-11 DIAGNOSIS — M25559 Pain in unspecified hip: Secondary | ICD-10-CM | POA: Diagnosis not present

## 2022-05-11 DIAGNOSIS — E1122 Type 2 diabetes mellitus with diabetic chronic kidney disease: Secondary | ICD-10-CM | POA: Diagnosis not present

## 2022-05-12 ENCOUNTER — Ambulatory Visit: Payer: Medicare Other | Attending: Cardiology | Admitting: Cardiology

## 2022-05-12 ENCOUNTER — Encounter: Payer: Self-pay | Admitting: Cardiology

## 2022-05-12 VITALS — BP 110/58 | HR 67 | Ht 71.0 in | Wt 147.2 lb

## 2022-05-12 DIAGNOSIS — I2583 Coronary atherosclerosis due to lipid rich plaque: Secondary | ICD-10-CM

## 2022-05-12 DIAGNOSIS — E7800 Pure hypercholesterolemia, unspecified: Secondary | ICD-10-CM

## 2022-05-12 DIAGNOSIS — I471 Supraventricular tachycardia: Secondary | ICD-10-CM | POA: Diagnosis not present

## 2022-05-12 DIAGNOSIS — I5021 Acute systolic (congestive) heart failure: Secondary | ICD-10-CM

## 2022-05-12 DIAGNOSIS — N1832 Chronic kidney disease, stage 3b: Secondary | ICD-10-CM

## 2022-05-12 DIAGNOSIS — I10 Essential (primary) hypertension: Secondary | ICD-10-CM | POA: Diagnosis not present

## 2022-05-12 DIAGNOSIS — I2510 Atherosclerotic heart disease of native coronary artery without angina pectoris: Secondary | ICD-10-CM

## 2022-05-12 LAB — BASIC METABOLIC PANEL
BUN/Creatinine Ratio: 29 — ABNORMAL HIGH (ref 10–24)
BUN: 49 mg/dL — ABNORMAL HIGH (ref 10–36)
CO2: 22 mmol/L (ref 20–29)
Calcium: 9 mg/dL (ref 8.6–10.2)
Chloride: 106 mmol/L (ref 96–106)
Creatinine, Ser: 1.69 mg/dL — ABNORMAL HIGH (ref 0.76–1.27)
Glucose: 204 mg/dL — ABNORMAL HIGH (ref 70–99)
Potassium: 5.3 mmol/L — ABNORMAL HIGH (ref 3.5–5.2)
Sodium: 141 mmol/L (ref 134–144)
eGFR: 36 mL/min/{1.73_m2} — ABNORMAL LOW (ref >59)

## 2022-05-12 NOTE — Patient Instructions (Signed)
  Follow-Up: At [facility], you and your health needs are our priority.  As part of our continuing mission to provide you with exceptional heart care, we have created designated Provider Care Teams.  These Care Teams include your primary Cardiologist (physician) and Advanced Practice Providers (APPs -  Physician Assistants and Nurse Practitioners) who all work together to provide you with the care you need, when you need it.  We recommend signing up for the patient portal called "MyChart".  Sign up information is provided on this After Visit Summary.  MyChart is used to connect with patients for Virtual Visits (Telemedicine).  Patients are able to view lab/test results, encounter notes, upcoming appointments, etc.  Non-urgent messages can be sent to your provider as well.   To learn more about what you can do with MyChart, go to [email or web address].    Your next appointment:   6 month(s)  The format for your next appointment:   In Person  Provider:   Brian Crenshaw, MD    

## 2022-05-13 ENCOUNTER — Telehealth: Payer: Self-pay | Admitting: Cardiology

## 2022-05-13 DIAGNOSIS — E875 Hyperkalemia: Secondary | ICD-10-CM

## 2022-05-13 NOTE — Telephone Encounter (Signed)
Patient's son returned call for lab results.  

## 2022-05-13 NOTE — Telephone Encounter (Signed)
Spoke with pt son, Aware of dr Ludwig Clarks recommendations.  Lab orders mailed to the pt

## 2022-05-18 DIAGNOSIS — R2681 Unsteadiness on feet: Secondary | ICD-10-CM | POA: Diagnosis not present

## 2022-05-18 DIAGNOSIS — M6281 Muscle weakness (generalized): Secondary | ICD-10-CM | POA: Diagnosis not present

## 2022-05-18 DIAGNOSIS — M25561 Pain in right knee: Secondary | ICD-10-CM | POA: Diagnosis not present

## 2022-05-18 DIAGNOSIS — M25562 Pain in left knee: Secondary | ICD-10-CM | POA: Diagnosis not present

## 2022-05-23 DIAGNOSIS — S41112A Laceration without foreign body of left upper arm, initial encounter: Secondary | ICD-10-CM | POA: Diagnosis not present

## 2022-05-24 DIAGNOSIS — M6281 Muscle weakness (generalized): Secondary | ICD-10-CM | POA: Diagnosis not present

## 2022-05-24 DIAGNOSIS — M25562 Pain in left knee: Secondary | ICD-10-CM | POA: Diagnosis not present

## 2022-05-24 DIAGNOSIS — M25561 Pain in right knee: Secondary | ICD-10-CM | POA: Diagnosis not present

## 2022-05-24 DIAGNOSIS — R2681 Unsteadiness on feet: Secondary | ICD-10-CM | POA: Diagnosis not present

## 2022-05-26 DIAGNOSIS — M25562 Pain in left knee: Secondary | ICD-10-CM | POA: Diagnosis not present

## 2022-05-26 DIAGNOSIS — R2681 Unsteadiness on feet: Secondary | ICD-10-CM | POA: Diagnosis not present

## 2022-05-26 DIAGNOSIS — M25561 Pain in right knee: Secondary | ICD-10-CM | POA: Diagnosis not present

## 2022-05-26 DIAGNOSIS — M6281 Muscle weakness (generalized): Secondary | ICD-10-CM | POA: Diagnosis not present

## 2022-05-31 ENCOUNTER — Ambulatory Visit: Payer: Self-pay | Admitting: *Deleted

## 2022-05-31 DIAGNOSIS — M6281 Muscle weakness (generalized): Secondary | ICD-10-CM | POA: Diagnosis not present

## 2022-05-31 DIAGNOSIS — M25561 Pain in right knee: Secondary | ICD-10-CM | POA: Diagnosis not present

## 2022-05-31 DIAGNOSIS — R2681 Unsteadiness on feet: Secondary | ICD-10-CM | POA: Diagnosis not present

## 2022-05-31 DIAGNOSIS — M25562 Pain in left knee: Secondary | ICD-10-CM | POA: Diagnosis not present

## 2022-05-31 NOTE — Patient Instructions (Signed)
Visit Information  Thank you for taking time to visit with me today. Please don't hesitate to contact me if I can be of assistance to you before our next scheduled telephone appointment.  Following are the goals we discussed today:  Follow up with PCP office on request for home health nurse.  Our next appointment is by telephone on 9/27  Please call the care guide team at [phone] if you need to cancel or reschedule your appointment.   Please call the Suicide and Crisis Lifeline: 988 call the [place] National Suicide Prevention Lifeline: 757-222-1974 or TTY: 380-131-7485 TTY 984-113-3021) to talk to a trained counselor call 1-800-273-TALK (toll free, 24 hour hotline) call the [facility]: ([phone]) [phone] call 911 if you are experiencing a Mental Health or [place] or need someone to talk to.  Patient verbalizes understanding of instructions and care plan provided today and agrees to view in [place]. Active MyChart status and patient understanding of how to access instructions and care plan via MyChart confirmed with patient.     The patient has been provided with contact information for the care management team and has been advised to call with any health related questions or concerns.   Valente David, RN, MSN, [place] Care Management Care Management Coordinator [phone]

## 2022-05-31 NOTE — Patient Outreach (Signed)
  Care Coordination   Follow Up Visit Note   05/31/2022 Name: Franklin Gonzalez MRN: [id number] DOB: [birth year]  Franklin Gonzalez is a 88 y.o. year old male who sees [place], MD for primary care. I spoke with  Franklin Gonzalez, son of Franklin Gonzalez by phone today.  What matters to the patients health and wellness today?  Son report PT has started but now having issue with skin care.  Has wound on arm that needs dressing changes.  He has called the PCP office to request home nursing, waiting on call back.      Goals Addressed               This Visit's Progress     COMPLETED: Per son, Start PT to help with knee pain (pt-stated)   On track     Care Coordination Interventions: Discussed importance of adherence to all scheduled medical appointments Advised patient to discuss HHPT options as well as referral to ortho for knee pain with provider Assessed social determinant of health barriers  8/25 - Son has not heard back from PCP office regarding PT orders.  Call placed to PCP office, spoke to [place], request order/referral be placed to agency of choice, [facility].  9/25 - Confirmed PT has started      Wound care for skin tear (pt-stated)        Care Coordination Interventions: Evaluation of current treatment plan related to Skin tear and patient's adherence to plan as established by provider Advised patient to follow up with PCP office regarding request for wound care Call placed to [facility], confirmed they are only outpatient PT, does not offer nursing care Explained to son if [facility] nursing services ordered, pt will have to switch entirely to [facility] services for PT as well         SDOH assessments and interventions completed:  No     Care Coordination Interventions Activated:  Yes  Care Coordination Interventions:  Yes, provided   Follow up plan: Follow up call scheduled for 9/27    Encounter Outcome:  Pt. Visit Completed   Valente David, RN, MSN, [place]  Care Management Care Management Coordinator [phone]

## 2022-06-01 DIAGNOSIS — E1122 Type 2 diabetes mellitus with diabetic chronic kidney disease: Secondary | ICD-10-CM | POA: Diagnosis not present

## 2022-06-01 DIAGNOSIS — Z7984 Long term (current) use of oral hypoglycemic drugs: Secondary | ICD-10-CM | POA: Diagnosis not present

## 2022-06-01 DIAGNOSIS — I509 Heart failure, unspecified: Secondary | ICD-10-CM | POA: Diagnosis not present

## 2022-06-01 DIAGNOSIS — I130 Hypertensive heart and chronic kidney disease with heart failure and stage 1 through stage 4 chronic kidney disease, or unspecified chronic kidney disease: Secondary | ICD-10-CM | POA: Diagnosis not present

## 2022-06-01 DIAGNOSIS — M545 Low back pain, unspecified: Secondary | ICD-10-CM | POA: Diagnosis not present

## 2022-06-01 DIAGNOSIS — I4891 Unspecified atrial fibrillation: Secondary | ICD-10-CM | POA: Diagnosis not present

## 2022-06-01 DIAGNOSIS — N189 Chronic kidney disease, unspecified: Secondary | ICD-10-CM | POA: Diagnosis not present

## 2022-06-01 DIAGNOSIS — S40922D Unspecified superficial injury of left upper arm, subsequent encounter: Secondary | ICD-10-CM | POA: Diagnosis not present

## 2022-06-01 DIAGNOSIS — Z4800 Encounter for change or removal of nonsurgical wound dressing: Secondary | ICD-10-CM | POA: Diagnosis not present

## 2022-06-02 ENCOUNTER — Encounter: Payer: Self-pay | Admitting: *Deleted

## 2022-06-03 ENCOUNTER — Ambulatory Visit: Payer: Self-pay | Admitting: *Deleted

## 2022-06-03 NOTE — Patient Outreach (Signed)
  Care Coordination   Follow Up Visit Note   06/03/2022 Name: Trevaris Pennella MRN: [id number] DOB: 1922/08/18  Trew Sunde is a 88 y.o. year old male who sees  Nation, MD for primary care. I spoke with Iona Beard, son of Gust Eugene by phone today.  What matters to the patients health and wellness today?  Member is now receiving PT and nursing services for wound care.  Denies any urgent concerns, encouraged to contact this care manager with questions.     Goals Addressed               This Visit's Progress     Wound care for skin tear (pt-stated)   On track     Care Coordination Interventions: Evaluation of current treatment plan related to Skin tear and patient's adherence to plan as established by provider Advised patient to follow up with PCP office regarding request for wound care Call placed to [facility], confirmed they are only outpatient PT, does not offer nursing care Explained to son if [facility] nursing services ordered, pt will have to switch entirely to [facility] services for PT as well Confirmed with son, nursing for wound care has started         SDOH assessments and interventions completed:  No     Care Coordination Interventions Activated:  Yes  Care Coordination Interventions:  Yes, provided   Follow up plan: Follow up call scheduled for 10/27    Encounter Outcome:  Pt. Visit Completed    Valente David, RN, MSN, [place] Care Management Care Management Coordinator [phone]

## 2022-06-03 NOTE — Patient Outreach (Signed)
  Care Coordination   06/03/2022 Name: Sakib Noguez MRN: [id number] DOB: 1922-03-30   Care Coordination Outreach Attempts:  An unsuccessful telephone outreach was attempted for a scheduled appointment today.  Follow Up Plan:  Additional outreach attempts will be made to offer the patient care coordination information and services.   Encounter Outcome:  No Answer  Care Coordination Interventions Activated:  No   Care Coordination Interventions:  No, not indicated    Valente David, RN, MSN, [facility] Care Management Care Management Coordinator [phone]

## 2022-06-04 ENCOUNTER — Encounter: Payer: Self-pay | Admitting: *Deleted

## 2022-06-08 DIAGNOSIS — I130 Hypertensive heart and chronic kidney disease with heart failure and stage 1 through stage 4 chronic kidney disease, or unspecified chronic kidney disease: Secondary | ICD-10-CM | POA: Diagnosis not present

## 2022-06-08 DIAGNOSIS — I4891 Unspecified atrial fibrillation: Secondary | ICD-10-CM | POA: Diagnosis not present

## 2022-06-08 DIAGNOSIS — Z4800 Encounter for change or removal of nonsurgical wound dressing: Secondary | ICD-10-CM | POA: Diagnosis not present

## 2022-06-08 DIAGNOSIS — S40922D Unspecified superficial injury of left upper arm, subsequent encounter: Secondary | ICD-10-CM | POA: Diagnosis not present

## 2022-06-08 DIAGNOSIS — E1122 Type 2 diabetes mellitus with diabetic chronic kidney disease: Secondary | ICD-10-CM | POA: Diagnosis not present

## 2022-06-08 DIAGNOSIS — I509 Heart failure, unspecified: Secondary | ICD-10-CM | POA: Diagnosis not present

## 2022-06-09 DIAGNOSIS — M6281 Muscle weakness (generalized): Secondary | ICD-10-CM | POA: Diagnosis not present

## 2022-06-09 DIAGNOSIS — M25561 Pain in right knee: Secondary | ICD-10-CM | POA: Diagnosis not present

## 2022-06-09 DIAGNOSIS — M25562 Pain in left knee: Secondary | ICD-10-CM | POA: Diagnosis not present

## 2022-06-09 DIAGNOSIS — R2681 Unsteadiness on feet: Secondary | ICD-10-CM | POA: Diagnosis not present

## 2022-06-11 DIAGNOSIS — I509 Heart failure, unspecified: Secondary | ICD-10-CM | POA: Diagnosis not present

## 2022-06-11 DIAGNOSIS — I130 Hypertensive heart and chronic kidney disease with heart failure and stage 1 through stage 4 chronic kidney disease, or unspecified chronic kidney disease: Secondary | ICD-10-CM | POA: Diagnosis not present

## 2022-06-11 DIAGNOSIS — S40922D Unspecified superficial injury of left upper arm, subsequent encounter: Secondary | ICD-10-CM | POA: Diagnosis not present

## 2022-06-11 DIAGNOSIS — Z4800 Encounter for change or removal of nonsurgical wound dressing: Secondary | ICD-10-CM | POA: Diagnosis not present

## 2022-06-11 DIAGNOSIS — I4891 Unspecified atrial fibrillation: Secondary | ICD-10-CM | POA: Diagnosis not present

## 2022-06-11 DIAGNOSIS — E1122 Type 2 diabetes mellitus with diabetic chronic kidney disease: Secondary | ICD-10-CM | POA: Diagnosis not present

## 2022-06-14 DIAGNOSIS — S40922D Unspecified superficial injury of left upper arm, subsequent encounter: Secondary | ICD-10-CM | POA: Diagnosis not present

## 2022-06-14 DIAGNOSIS — I130 Hypertensive heart and chronic kidney disease with heart failure and stage 1 through stage 4 chronic kidney disease, or unspecified chronic kidney disease: Secondary | ICD-10-CM | POA: Diagnosis not present

## 2022-06-14 DIAGNOSIS — E1122 Type 2 diabetes mellitus with diabetic chronic kidney disease: Secondary | ICD-10-CM | POA: Diagnosis not present

## 2022-06-14 DIAGNOSIS — I509 Heart failure, unspecified: Secondary | ICD-10-CM | POA: Diagnosis not present

## 2022-06-14 DIAGNOSIS — Z4800 Encounter for change or removal of nonsurgical wound dressing: Secondary | ICD-10-CM | POA: Diagnosis not present

## 2022-06-14 DIAGNOSIS — I4891 Unspecified atrial fibrillation: Secondary | ICD-10-CM | POA: Diagnosis not present

## 2022-06-15 DIAGNOSIS — E1140 Type 2 diabetes mellitus with diabetic neuropathy, unspecified: Secondary | ICD-10-CM | POA: Diagnosis not present

## 2022-06-15 DIAGNOSIS — E1122 Type 2 diabetes mellitus with diabetic chronic kidney disease: Secondary | ICD-10-CM | POA: Diagnosis not present

## 2022-06-15 DIAGNOSIS — E039 Hypothyroidism, unspecified: Secondary | ICD-10-CM | POA: Diagnosis not present

## 2022-06-16 ENCOUNTER — Encounter (HOSPITAL_COMMUNITY): Payer: Self-pay | Admitting: Emergency Medicine

## 2022-06-16 ENCOUNTER — Telehealth: Payer: Self-pay | Admitting: Cardiology

## 2022-06-16 ENCOUNTER — Other Ambulatory Visit: Payer: Self-pay

## 2022-06-16 ENCOUNTER — Inpatient Hospital Stay (HOSPITAL_COMMUNITY)
Admission: EM | Admit: 2022-06-16 | Discharge: 2022-06-20 | DRG: 291 | Disposition: A | Discharge status: Home Health | Payer: Medicare Other | Attending: Student | Admitting: Student

## 2022-06-16 ENCOUNTER — Emergency Department (HOSPITAL_COMMUNITY): Payer: Medicare Other

## 2022-06-16 DIAGNOSIS — I5041 Acute combined systolic (congestive) and diastolic (congestive) heart failure: Secondary | ICD-10-CM | POA: Diagnosis not present

## 2022-06-16 DIAGNOSIS — R9431 Abnormal electrocardiogram [ECG] [EKG]: Secondary | ICD-10-CM | POA: Diagnosis not present

## 2022-06-16 DIAGNOSIS — I5043 Acute on chronic combined systolic (congestive) and diastolic (congestive) heart failure: Secondary | ICD-10-CM | POA: Diagnosis present

## 2022-06-16 DIAGNOSIS — I2510 Atherosclerotic heart disease of native coronary artery without angina pectoris: Secondary | ICD-10-CM | POA: Diagnosis present

## 2022-06-16 DIAGNOSIS — Z66 Do not resuscitate: Secondary | ICD-10-CM

## 2022-06-16 DIAGNOSIS — D696 Thrombocytopenia, unspecified: Secondary | ICD-10-CM | POA: Diagnosis present

## 2022-06-16 DIAGNOSIS — J9601 Acute respiratory failure with hypoxia: Secondary | ICD-10-CM | POA: Diagnosis present

## 2022-06-16 DIAGNOSIS — E1151 Type 2 diabetes mellitus with diabetic peripheral angiopathy without gangrene: Secondary | ICD-10-CM | POA: Diagnosis present

## 2022-06-16 DIAGNOSIS — M25562 Pain in left knee: Secondary | ICD-10-CM | POA: Diagnosis not present

## 2022-06-16 DIAGNOSIS — I5082 Biventricular heart failure: Secondary | ICD-10-CM | POA: Diagnosis present

## 2022-06-16 DIAGNOSIS — I130 Hypertensive heart and chronic kidney disease with heart failure and stage 1 through stage 4 chronic kidney disease, or unspecified chronic kidney disease: Principal | ICD-10-CM | POA: Diagnosis present

## 2022-06-16 DIAGNOSIS — I5023 Acute on chronic systolic (congestive) heart failure: Secondary | ICD-10-CM | POA: Diagnosis not present

## 2022-06-16 DIAGNOSIS — Z79899 Other long term (current) drug therapy: Secondary | ICD-10-CM | POA: Diagnosis not present

## 2022-06-16 DIAGNOSIS — R338 Other retention of urine: Secondary | ICD-10-CM | POA: Diagnosis present

## 2022-06-16 DIAGNOSIS — Z955 Presence of coronary angioplasty implant and graft: Secondary | ICD-10-CM

## 2022-06-16 DIAGNOSIS — E1122 Type 2 diabetes mellitus with diabetic chronic kidney disease: Secondary | ICD-10-CM | POA: Diagnosis present

## 2022-06-16 DIAGNOSIS — N1832 Chronic kidney disease, stage 3b: Secondary | ICD-10-CM | POA: Diagnosis present

## 2022-06-16 DIAGNOSIS — E039 Hypothyroidism, unspecified: Secondary | ICD-10-CM | POA: Diagnosis present

## 2022-06-16 DIAGNOSIS — I252 Old myocardial infarction: Secondary | ICD-10-CM | POA: Diagnosis not present

## 2022-06-16 DIAGNOSIS — N183 Chronic kidney disease, stage 3 unspecified: Secondary | ICD-10-CM | POA: Diagnosis present

## 2022-06-16 DIAGNOSIS — I071 Rheumatic tricuspid insufficiency: Secondary | ICD-10-CM | POA: Diagnosis present

## 2022-06-16 DIAGNOSIS — R001 Bradycardia, unspecified: Secondary | ICD-10-CM | POA: Diagnosis not present

## 2022-06-16 DIAGNOSIS — R531 Weakness: Secondary | ICD-10-CM | POA: Diagnosis not present

## 2022-06-16 DIAGNOSIS — I110 Hypertensive heart disease with heart failure: Secondary | ICD-10-CM | POA: Diagnosis not present

## 2022-06-16 DIAGNOSIS — Z1152 Encounter for screening for COVID-19: Secondary | ICD-10-CM

## 2022-06-16 DIAGNOSIS — E785 Hyperlipidemia, unspecified: Secondary | ICD-10-CM | POA: Diagnosis present

## 2022-06-16 DIAGNOSIS — I509 Heart failure, unspecified: Principal | ICD-10-CM

## 2022-06-16 DIAGNOSIS — Z951 Presence of aortocoronary bypass graft: Secondary | ICD-10-CM | POA: Diagnosis not present

## 2022-06-16 DIAGNOSIS — N179 Acute kidney failure, unspecified: Secondary | ICD-10-CM | POA: Diagnosis present

## 2022-06-16 DIAGNOSIS — I2489 Other forms of acute ischemic heart disease: Secondary | ICD-10-CM | POA: Diagnosis present

## 2022-06-16 DIAGNOSIS — I429 Cardiomyopathy, unspecified: Secondary | ICD-10-CM | POA: Diagnosis present

## 2022-06-16 DIAGNOSIS — M6281 Muscle weakness (generalized): Secondary | ICD-10-CM | POA: Diagnosis not present

## 2022-06-16 DIAGNOSIS — Z8249 Family history of ischemic heart disease and other diseases of the circulatory system: Secondary | ICD-10-CM | POA: Diagnosis not present

## 2022-06-16 DIAGNOSIS — I471 Supraventricular tachycardia, unspecified: Secondary | ICD-10-CM | POA: Diagnosis present

## 2022-06-16 DIAGNOSIS — R2681 Unsteadiness on feet: Secondary | ICD-10-CM | POA: Diagnosis not present

## 2022-06-16 DIAGNOSIS — Z23 Encounter for immunization: Secondary | ICD-10-CM | POA: Diagnosis not present

## 2022-06-16 DIAGNOSIS — I10 Essential (primary) hypertension: Secondary | ICD-10-CM | POA: Diagnosis not present

## 2022-06-16 DIAGNOSIS — I452 Bifascicular block: Secondary | ICD-10-CM | POA: Diagnosis present

## 2022-06-16 DIAGNOSIS — I4819 Other persistent atrial fibrillation: Secondary | ICD-10-CM | POA: Diagnosis present

## 2022-06-16 DIAGNOSIS — N401 Enlarged prostate with lower urinary tract symptoms: Secondary | ICD-10-CM | POA: Diagnosis present

## 2022-06-16 DIAGNOSIS — E119 Type 2 diabetes mellitus without complications: Secondary | ICD-10-CM

## 2022-06-16 DIAGNOSIS — Z79890 Hormone replacement therapy (postmenopausal): Secondary | ICD-10-CM

## 2022-06-16 DIAGNOSIS — I2583 Coronary atherosclerosis due to lipid rich plaque: Secondary | ICD-10-CM | POA: Diagnosis not present

## 2022-06-16 DIAGNOSIS — Z7982 Long term (current) use of aspirin: Secondary | ICD-10-CM

## 2022-06-16 DIAGNOSIS — M25561 Pain in right knee: Secondary | ICD-10-CM | POA: Diagnosis not present

## 2022-06-16 DIAGNOSIS — R0902 Hypoxemia: Secondary | ICD-10-CM | POA: Diagnosis not present

## 2022-06-16 DIAGNOSIS — I5021 Acute systolic (congestive) heart failure: Secondary | ICD-10-CM

## 2022-06-16 DIAGNOSIS — Z7984 Long term (current) use of oral hypoglycemic drugs: Secondary | ICD-10-CM

## 2022-06-16 LAB — CBC WITH DIFFERENTIAL/PLATELET
Abs Immature Granulocytes: 0.02 10*3/uL (ref 0.00–0.07)
Basophils Absolute: 0 10*3/uL (ref 0.0–0.1)
Basophils Relative: 0 %
Eosinophils Absolute: 0 10*3/uL (ref 0.0–0.5)
Eosinophils Relative: 1 %
HCT: 36.7 % — ABNORMAL LOW (ref 39.0–52.0)
Hemoglobin: 11.9 g/dL — ABNORMAL LOW (ref 13.0–17.0)
Immature Granulocytes: 0 %
Lymphocytes Relative: 17 %
Lymphs Abs: 0.9 10*3/uL (ref 0.7–4.0)
MCH: 31.3 pg (ref 26.0–34.0)
MCHC: 32.4 g/dL (ref 30.0–36.0)
MCV: 96.6 fL (ref 80.0–100.0)
Monocytes Absolute: 0.4 10*3/uL (ref 0.1–1.0)
Monocytes Relative: 8 %
Neutro Abs: 4 10*3/uL (ref 1.7–7.7)
Neutrophils Relative %: 74 %
Platelets: 124 10*3/uL — ABNORMAL LOW (ref 150–400)
RBC: 3.8 MIL/uL — ABNORMAL LOW (ref 4.22–5.81)
RDW: 15.1 % (ref 11.5–15.5)
WBC: 5.3 10*3/uL (ref 4.0–10.5)
nRBC: 0 % (ref 0.0–0.2)

## 2022-06-16 LAB — URINALYSIS, ROUTINE W REFLEX MICROSCOPIC
Bilirubin Urine: NEGATIVE
Glucose, UA: NEGATIVE mg/dL
Hgb urine dipstick: NEGATIVE
Ketones, ur: NEGATIVE mg/dL
Nitrite: NEGATIVE
Protein, ur: NEGATIVE mg/dL
Specific Gravity, Urine: 1.008 (ref 1.005–1.030)
pH: 5 (ref 5.0–8.0)

## 2022-06-16 LAB — TROPONIN I (HIGH SENSITIVITY)
Troponin I (High Sensitivity): 27 ng/L — ABNORMAL HIGH (ref <18)
Troponin I (High Sensitivity): 27 ng/L — ABNORMAL HIGH (ref <18)

## 2022-06-16 LAB — CBG MONITORING, ED
Glucose-Capillary: 192 mg/dL — ABNORMAL HIGH (ref 70–99)
Glucose-Capillary: 174 mg/dL — ABNORMAL HIGH (ref 70–99)

## 2022-06-16 LAB — BASIC METABOLIC PANEL
Anion gap: 7 (ref 5–15)
BUN: 43 mg/dL — ABNORMAL HIGH (ref 8–23)
CO2: 22 mmol/L (ref 22–32)
Calcium: 8.5 mg/dL — ABNORMAL LOW (ref 8.9–10.3)
Chloride: 110 mmol/L (ref 98–111)
Creatinine, Ser: 1.62 mg/dL — ABNORMAL HIGH (ref 0.61–1.24)
GFR, Estimated: 38 mL/min — ABNORMAL LOW (ref >60)
Glucose, Bld: 187 mg/dL — ABNORMAL HIGH (ref 70–99)
Potassium: 4.2 mmol/L (ref 3.5–5.1)
Sodium: 139 mmol/L (ref 135–145)

## 2022-06-16 LAB — RESP PANEL BY RT-PCR (FLU A&B, COVID) ARPGX2
Influenza A by PCR: NEGATIVE
Influenza B by PCR: NEGATIVE
SARS Coronavirus 2 by RT PCR: NEGATIVE

## 2022-06-16 LAB — TSH: TSH: 1.803 u[IU]/mL (ref 0.350–4.500)

## 2022-06-16 LAB — BRAIN NATRIURETIC PEPTIDE: B Natriuretic Peptide: 2282 pg/mL — ABNORMAL HIGH (ref 0.0–100.0)

## 2022-06-16 LAB — MAGNESIUM: Magnesium: 2.4 mg/dL (ref 1.7–2.4)

## 2022-06-16 MED ORDER — FUROSEMIDE 10 MG/ML IJ SOLN
40.0000 mg | Freq: Two times a day (BID) | INTRAMUSCULAR | Status: DC
  Administered 2022-06-17 – 2022-06-19 (×5): 40 mg via INTRAVENOUS
  Filled 2022-06-16 (×5): qty 4

## 2022-06-16 MED ORDER — LEVOTHYROXINE SODIUM 88 MCG PO TABS
88.0000 ug | ORAL_TABLET | Freq: Every day | ORAL | Status: DC
  Administered 2022-06-17 – 2022-06-20 (×4): 88 ug via ORAL
  Filled 2022-06-16 (×4): qty 1

## 2022-06-16 MED ORDER — ACETAMINOPHEN 325 MG PO TABS
650.0000 mg | ORAL_TABLET | Freq: Four times a day (QID) | ORAL | Status: DC | PRN
  Filled 2022-06-16: qty 2

## 2022-06-16 MED ORDER — INSULIN ASPART 100 UNIT/ML IJ SOLN
0.0000 [IU] | Freq: Three times a day (TID) | INTRAMUSCULAR | Status: DC
  Administered 2022-06-17: 2 [IU] via SUBCUTANEOUS
  Administered 2022-06-17 – 2022-06-18 (×4): 1 [IU] via SUBCUTANEOUS
  Administered 2022-06-18: 2 [IU] via SUBCUTANEOUS
  Administered 2022-06-19: 1 [IU] via SUBCUTANEOUS
  Administered 2022-06-19: 3 [IU] via SUBCUTANEOUS
  Administered 2022-06-19: 2 [IU] via SUBCUTANEOUS
  Administered 2022-06-20: 5 [IU] via SUBCUTANEOUS

## 2022-06-16 MED ORDER — DORZOLAMIDE HCL-TIMOLOL MAL 2-0.5 % OP SOLN
1.0000 [drp] | Freq: Two times a day (BID) | OPHTHALMIC | Status: DC
  Administered 2022-06-17 – 2022-06-20 (×7): 1 [drp] via OPHTHALMIC
  Filled 2022-06-16 (×2): qty 10

## 2022-06-16 MED ORDER — ASPIRIN 81 MG PO TBEC
81.0000 mg | DELAYED_RELEASE_TABLET | Freq: Every day | ORAL | Status: DC
  Administered 2022-06-17 – 2022-06-20 (×4): 81 mg via ORAL
  Filled 2022-06-16 (×4): qty 1

## 2022-06-16 MED ORDER — NYSTATIN 100000 UNIT/GM EX POWD
Freq: Three times a day (TID) | CUTANEOUS | Status: DC
  Administered 2022-06-18: 1 via TOPICAL
  Filled 2022-06-16 (×2): qty 15

## 2022-06-16 MED ORDER — ATORVASTATIN CALCIUM 10 MG PO TABS
10.0000 mg | ORAL_TABLET | Freq: Every day | ORAL | Status: DC
  Administered 2022-06-16 – 2022-06-19 (×4): 10 mg via ORAL
  Filled 2022-06-16 (×4): qty 1

## 2022-06-16 MED ORDER — FUROSEMIDE 10 MG/ML IJ SOLN
60.0000 mg | Freq: Once | INTRAMUSCULAR | Status: AC
  Administered 2022-06-16: 60 mg via INTRAVENOUS
  Filled 2022-06-16: qty 6

## 2022-06-16 MED ORDER — IPRATROPIUM-ALBUTEROL 0.5-2.5 (3) MG/3ML IN SOLN
3.0000 mL | Freq: Once | RESPIRATORY_TRACT | Status: AC
  Administered 2022-06-16: 3 mL via RESPIRATORY_TRACT
  Filled 2022-06-16: qty 3

## 2022-06-16 MED ORDER — LOSARTAN POTASSIUM 50 MG PO TABS
50.0000 mg | ORAL_TABLET | Freq: Every day | ORAL | Status: DC
  Administered 2022-06-17 – 2022-06-20 (×3): 50 mg via ORAL
  Filled 2022-06-16 (×4): qty 1

## 2022-06-16 MED ORDER — AMIODARONE HCL 200 MG PO TABS
100.0000 mg | ORAL_TABLET | Freq: Every day | ORAL | Status: DC
  Administered 2022-06-17 – 2022-06-20 (×4): 100 mg via ORAL
  Filled 2022-06-16 (×4): qty 1

## 2022-06-16 MED ORDER — DOXAZOSIN MESYLATE 2 MG PO TABS
2.0000 mg | ORAL_TABLET | Freq: Every day | ORAL | Status: DC
  Administered 2022-06-16 – 2022-06-19 (×4): 2 mg via ORAL
  Filled 2022-06-16 (×4): qty 1

## 2022-06-16 MED ORDER — ENOXAPARIN SODIUM 30 MG/0.3ML IJ SOSY
30.0000 mg | PREFILLED_SYRINGE | INTRAMUSCULAR | Status: DC
  Administered 2022-06-16 – 2022-06-17 (×2): 30 mg via SUBCUTANEOUS
  Filled 2022-06-16 (×2): qty 0.3

## 2022-06-16 MED ORDER — LATANOPROST 0.005 % OP SOLN
1.0000 [drp] | Freq: Every evening | OPHTHALMIC | Status: DC
  Administered 2022-06-16 – 2022-06-19 (×4): 1 [drp] via OPHTHALMIC
  Filled 2022-06-16 (×2): qty 2.5

## 2022-06-16 MED ORDER — ACETAMINOPHEN 650 MG RE SUPP: 650.0000 mg | Freq: Four times a day (QID) | RECTAL | Status: DC | PRN

## 2022-06-16 MED ORDER — INSULIN ASPART 100 UNIT/ML IJ SOLN: 0.0000 [IU] | Freq: Every day | INTRAMUSCULAR | Status: DC

## 2022-06-16 MED ORDER — POLYETHYLENE GLYCOL 3350 17 G PO PACK: 17.0000 g | PACK | Freq: Every day | ORAL | Status: DC | PRN

## 2022-06-16 MED ORDER — LEVOTHYROXINE SODIUM 88 MCG PO TABS: 88.0000 ug | ORAL_TABLET | Freq: Every day | ORAL | Status: DC

## 2022-06-16 NOTE — Assessment & Plan Note (Signed)
Continue amiodarone. 

## 2022-06-16 NOTE — Telephone Encounter (Signed)
Pt c/o swelling: STAT is pt has developed SOB within 24 hours  If swelling, where is the swelling located? Son states patient has swelling in arms and legs and chin.  Right legs swollen more than the left.   How much weight have you gained and in what time span? Hasn't been weighed because patient is too weak  Have you gained 3 pounds in a day or 5 pounds in a week? unsure   Do you have a log of your daily weights (if so, list)? unsure  Are you currently taking a fluid pill? Yes, takes one every other day   Are you currently SOB? Yes, started in the past couple of weeks.   Have you traveled recently? No  Son said when you call him it will go to his voice mail, and he will have to call you right back.

## 2022-06-16 NOTE — Telephone Encounter (Signed)
LMTCB

## 2022-06-16 NOTE — Assessment & Plan Note (Signed)
O2 sats down to 89% on room air with mild exertion.  Chest x-ray clear, but he has faint expiratory wheezing.  He denies history of cigarette use -DuoNebs for now -Diuresis

## 2022-06-16 NOTE — ED Notes (Signed)
Pt placed on 2 L via [place] due to SpO2 of 89%

## 2022-06-16 NOTE — Telephone Encounter (Signed)
Son stated patient is being transported by EMS to [facility] ED due to "possible CHF". Son stated patient has a pouch under his chin and fluid pouch on rt arm, Right leg edematous. P 40-50, sat 89%  during PT. When EMS arrived, VSS, sat 94%. Son stated he did not want to take a chance with his father and called EMS.

## 2022-06-16 NOTE — Assessment & Plan Note (Signed)
History of CABG 1993.  Follows with Dr. Stanford Breed.

## 2022-06-16 NOTE — Assessment & Plan Note (Addendum)
Pitting bilateral lower extremity swelling to thighs, chest x-ray clear.  Per chart, with today 152, last check a month ago at cardiologist office was 147.  BNP elevated at 2282.  Troponin 27x2.  Reports compliance with torsemide 20 mg every other day.  Last echo 2021 EF of 25% -Obtain updated echo -IV Lasix 60 mg x 1 given, continue 40 twice daily -Right lower extremity venous ultrasound negative for DVT -Cardiology consult in a.m. -EKG suggested atrial fibrillation, son reports 3 falls in the past year. With advanced age, I have held off on anticoagulation, will defer to cardiology. -Check magnesium, TSH

## 2022-06-16 NOTE — H&P (Signed)
History and Physical    Franklin Gonzalez GYJ:[id number] DOB: [birth year] DOA: 06/16/2022  PCP: Worthington Nation, MD   Patient coming from: Home  I have personally briefly reviewed patient's old medical records in [place]  Chief Complaint: Swelling  HPI: Franklin Gonzalez is a 88 y.o. male with medical history significant for diastolic and systolic CHF, diabetes mellitus, CKD 3, coronary artery disease, status post CABG 1993. Patient was brought to the ED via EMS with reports of bilateral lower extremity and upper extremity swelling.  Son present at bedside also said patient had swelling underneath his chin.  Patient is on torsemide 20 mg every other day and he reports compliance with this.  He denies difficulty breathing.  No cough.  No chest pain. Patient lives alone, but has caregiver with him morning still 12, and  5 to 7 pm in the evening.  He ambulates with a walker.  ED Course: Heart rate 50s.  Stable vitals O2 sats down to 89% on room air with minimal exertion.  BNP elevated at 2282..  Right lower extremity venous ultrasound negative for DVT.  Troponin 27 X 2.  Chest x-ray with small left pleural effusion.  EKG showing atrial fibrillation rate 58.  QTc 527. IV Lasix 60 mg x 1 given. Hospitalist to admit for decompensated CHF.  Review of Systems: As per HPI all other systems reviewed and negative.  Past Medical History:  Diagnosis Date   Bifascicular block    Coronary artery disease    CABG 1993 at [facility]; NSTEMI 09/2012 s/p DES to SVG-OM1, DES to SVG-PDA    DM (diabetes mellitus) ([place])    HTN (hypertension)    SVT (supraventricular tachycardia)     Past Surgical History:  Procedure Laterality Date   [place] CABG   LEFT HEART CATHETERIZATION WITH CORONARY/GRAFT ANGIOGRAM N/A 10/04/2012   Procedure: LEFT HEART CATHETERIZATION WITH Beatrix Fetters;  Surgeon: Sherren Mocha, MD;  Location: [facility] CATH LAB;  Service: Cardiovascular;   Laterality: N/A;     reports that he has never smoked. He has never used smokeless tobacco. He reports that he does not drink alcohol and does not use drugs.  No Known Allergies  Family History  Problem Relation Age of Onset   CAD Other        multiple relatives including father and son    Prior to Admission medications   Medication Sig Start Date End Date Taking? Authorizing Provider  amiodarone (PACERONE) 200 MG tablet Take 1/2 (one-half) tablet by mouth once daily 12/22/21   Lelon Perla, MD  ammonium lactate (AMLACTIN) 12 % cream Apply topically 2 (two) times daily. 06/07/22   [provider]  aspirin EC 81 MG EC tablet Take 1 tablet (81 mg total) by mouth daily. 10/07/12   Edmisten, Brooke O, PA-C  atorvastatin (LIPITOR) 10 MG tablet Take 10 mg by mouth at bedtime.    [provider]  dorzolamide-timolol (COSOPT) 22.3-6.8 MG/ML ophthalmic solution Place 1 drop into both eyes 2 (two) times daily. 09/21/21   [provider]  doxazosin (CARDURA) 4 MG tablet Take 2 mg by mouth at bedtime.    [provider]  EUTHYROX 75 MCG tablet Take 75 mcg by mouth daily. 09/01/20   [provider]  glipiZIDE (GLUCOTROL) 5 MG tablet Take 2.5 mg by mouth daily. 05/11/22   [provider]  latanoprost (XALATAN) 0.005 % ophthalmic solution Place 1 drop into both eyes every evening.  10/17/21   [provider]  levothyroxine (SYNTHROID) 88 MCG tablet Take 88 mcg by mouth daily. 11/15/21   [provider]  losartan (COZAAR) 50 MG tablet Take 1 tablet by mouth once daily 01/04/22   Lelon Perla, MD  nitroGLYCERIN (NITROSTAT) 0.4 MG SL tablet Place 1 tablet (0.4 mg total) under the tongue every 5 (five) minutes x 3 doses as needed for chest pain. 10/07/12   Edmisten, Azzie Roup, PA-C  Potassium Chloride ER 20 MEQ TBCR SMARTSIG:0.5 Tablet(s) By Mouth Every Other Day 05/24/22   [provider]  potassium chloride SA (KLOR-CON) 20 MEQ  tablet Take 1 tablet (20 mEq total) by mouth daily. 07/02/20   Barton Dubois, MD  torsemide [facility]) 20 MG tablet Take one tablet every other day 11/06/21   Lelon Perla, MD    Physical Exam: Vitals:   06/16/22 1530 06/16/22 1600 06/16/22 1612 06/16/22 1630  BP: 129/74 128/79  (!) 132/92  Pulse: (!) 57 (!) 57  (!) 59  Resp: 13 20  17   Temp:      TempSrc:      SpO2: 94% 99% (!) 89% 98%  Weight:      Height:        Constitutional: NAD, calm, comfortable Vitals:   06/16/22 1530 06/16/22 1600 06/16/22 1612 06/16/22 1630  BP: 129/74 128/79  (!) 132/92  Pulse: (!) 57 (!) 57  (!) 59  Resp: 13 20  17   Temp:      TempSrc:      SpO2: 94% 99% (!) 89% 98%  Weight:      Height:       Eyes: PERRL, lids and conjunctivae normal ENMT: Mucous membranes are moist.   Neck: normal, supple, no masses, no thyromegaly Respiratory: Diffuse faint expiratory wheezing, no crackles.  Normal respiratory effort. Cardiovascular: Regular rate and rhythm, no murmurs / rubs / gallops.  2+ pitting bilateral lower extremity edema to thighs, puffiness to upper extremities, right worse than left.  Skin is thin with senile purpura Abdomen: no tenderness, no masses palpated. No hepatosplenomegaly. Bowel sounds positive.  Musculoskeletal: no clubbing / cyanosis. No joint deformity upper and lower extremities.  Skin: Erythema, with satellite lesions to umbilical area, with foul smell, no open wounds. Neurologic: 4+/5 strength in bilateral lower extremities, Psychiatric: Normal judgment and insight. Alert and oriented x 3. Normal mood.   Labs on Admission: I have personally reviewed following labs and imaging studies  CBC: Recent Labs  Lab 06/16/22 1411  WBC 5.3  NEUTROABS 4.0  HGB 11.9*  HCT 36.7*  MCV 96.6  PLT [id number]*   Basic Metabolic Panel: Recent Labs  Lab 06/16/22 1411  NA 139  K 4.2  CL 110  CO2 22  GLUCOSE 187*  BUN 43*  CREATININE 1.62*  CALCIUM 8.5*   CBG: Recent Labs  Lab  06/16/22 1409  GLUCAP 192*   Urine analysis:    Component Value Date/Time   COLORURINE STRAW (A) 06/16/2022 [place] 06/16/2022 1548   LABSPEC 1.008 06/16/2022 1548   PHURINE 5.0 06/16/2022 [place] 06/16/2022 [place] 06/16/2022 [place] 06/16/2022 [place] 06/16/2022 1548   PROTEINUR NEGATIVE 06/16/2022 1548   UROBILINOGEN 1.0 10/03/2012 1151   NITRITE NEGATIVE 06/16/2022 1548   LEUKOCYTESUR SMALL (A) 06/16/2022 1548    Radiological Exams on Admission: US Venous Img Lower Unilateral Right  Result Date: 06/16/2022 CLINICAL DATA:  Right lower extremity edema. EXAM: RIGHT LOWER EXTREMITY VENOUS DOPPLER ULTRASOUND TECHNIQUE: Gray-scale sonography with graded compression, as well as color Doppler and duplex ultrasound were performed to evaluate the lower extremity deep venous systems from the level of the common femoral vein and including the common femoral, femoral, profunda femoral, popliteal and calf veins including the posterior tibial, peroneal and gastrocnemius veins when visible. The superficial great saphenous vein was also interrogated. Spectral Doppler was utilized to evaluate flow at rest and with distal augmentation maneuvers in the common femoral, femoral and popliteal veins. COMPARISON:  None Available. FINDINGS: Contralateral Common Femoral Vein: Respiratory phasicity is normal and symmetric with the symptomatic side. No evidence of thrombus. Normal compressibility. Common Femoral Vein: No evidence of thrombus. Normal compressibility, respiratory phasicity and response to augmentation. Saphenofemoral Junction: No evidence of thrombus. Normal compressibility and flow on color Doppler imaging. Profunda Femoral Vein: No evidence of thrombus. Normal compressibility and flow on color Doppler imaging. Femoral Vein: No evidence of thrombus. Normal compressibility, respiratory phasicity and response to  augmentation. Popliteal Vein: No evidence of thrombus. Normal compressibility, respiratory phasicity and response to augmentation. Calf Veins: No evidence of thrombus. Normal compressibility and flow on color Doppler imaging. Superficial Great Saphenous Vein: No evidence of thrombus. Normal compressibility. Venous Reflux:  None. Other Findings: No evidence of superficial thrombophlebitis or abnormal fluid collection. IMPRESSION: No evidence of right lower extremity deep venous thrombosis. Electronically Signed   By: Aletta Edouard M.D.   On: 06/16/2022 15:04   DG Chest Port 1 View  Result Date: 06/16/2022 CLINICAL DATA:  Leg edema EXAM: PORTABLE CHEST 1 VIEW COMPARISON:  Chest 12/23/2021 FINDINGS: Prior median sternotomy and CABG. Cardiac enlargement. Negative for heart failure or edema. Small left pleural effusion with improvement. No significant right effusion Mild bibasilar atelectasis. IMPRESSION: 1. Cardiac enlargement without heart failure. 2. Small left pleural effusion with improvement. Electronically Signed   By: Franchot Gallo M.D.   On: 06/16/2022 14:37    EKG: Independently reviewed.  Rhythm slightly irregular, P waves not quite distinct.  Read as atrial fibrillation, rate 58.  Assessment/Plan Principal Problem:   Systolic and diastolic CHF, acute (HCC) Active Problems:   Acute respiratory failure with hypoxia (HCC)   HTN (hypertension)   CAD (coronary artery disease)   Chronic renal insufficiency, stage III (moderate) (HCC)   Type 2 diabetes mellitus without complications (HCC)  Assessment and Plan: * Systolic and diastolic CHF, acute ([place]) Pitting bilateral lower extremity swelling to thighs, chest x-ray clear.  Per chart, with today 152, last check a month ago at cardiologist office was 147.  BNP elevated at 2282.  Troponin 27x2.  Reports compliance with torsemide 20 mg every other day.  Last echo 2021 EF of 25% -Obtain updated echo -IV Lasix 60 mg x 1 given, continue 40 twice  daily -Right lower extremity venous ultrasound negative for DVT -Cardiology consult in a.m. -EKG suggested atrial fibrillation, son reports 3 falls in the past year. With advanced age, I have held off on anticoagulation, will defer to cardiology. -Check magnesium, TSH  Acute respiratory failure with hypoxia (HCC) O2 sats down to 89% on room air with mild exertion.  Chest x-ray clear, but he has faint expiratory wheezing.  He denies history of cigarette use -DuoNebs for now -Diuresis  Prolonged QT interval QTc 527.  Check magnesium.  Potassium 4.2.  He is on amiodarone.  Type 2 diabetes mellitus without complications ([place]) - [id number] - SSi- S -Hold home glipizide  Chronic renal  insufficiency, stage III (moderate) (HCC) CKD stage IIIb.  Creatinine 1.6 at baseline.  CAD (coronary artery disease) History of CABG 1993.  Follows with Dr. Stanford Breed.  HTN (hypertension) Stable. -Resume doxazosin, losartan  SVT (supraventricular tachycardia) (HCC) Continue amiodarone.    DVT prophylaxis: Lovenox Code Status: DNR confirmed with patient's son Franklin Gonzalez at bedside. Family Communication: Patient's son Franklin Gonzalez is patient's HCPOA, present at bedside. Disposition Plan: ~ 2 days Consults called: Cards Admission status: Inpt tele  I certify that at the point of admission it is my clinical judgment that the patient will require inpatient hospital care spanning beyond 2 midnights from the point of admission due to high intensity of service, high risk for further deterioration and high frequency of surveillance required.    Author: Bethena Roys, MD 06/16/2022 8:09 PM  For on call review [email or web address].

## 2022-06-16 NOTE — ED Notes (Signed)
Skin breakdown and reddening noted on pt's umbilicus and penis/scrotum area

## 2022-06-16 NOTE — Assessment & Plan Note (Signed)
-   Hgba1c - SSi- S -Hold home glipizide

## 2022-06-16 NOTE — Assessment & Plan Note (Signed)
Stable. -Resume doxazosin, losartan

## 2022-06-16 NOTE — Assessment & Plan Note (Signed)
QTc 527.  Check magnesium.  Potassium 4.2.  He is on amiodarone.

## 2022-06-16 NOTE — ED Provider Notes (Signed)
[facility] EMERGENCY DEPARTMENT Provider Note   CSN: [id number] Arrival date & time: 06/16/22  1338    History  Chief Complaint  Patient presents with   Leg Swelling    Franklin Gonzalez is a 88 y.o. male with SVT, CAD, heart failure followed by Dr. Jens Som with cardiology, diabetes, CKD here for evaluation of edema.  According to EMS he has had some swelling to his bilateral legs right greater than left as well as his hands.  He apparently takes torsemide every other day according to recent Cards note.  He does state he gets some mild shortness of breath however denies any PND or orthopnea.  His legs hurt when he ambulates.  Apparently he lives on his own however someone checks in on him to make some breakfast and lunch according to patient. No recent fever, N/V, abd pain. States he feels generally weak. He is unsure of what exact mediations he takes and if he has missed any doses.  Known EF 25% in 2021  SON- worsening SOB over the last few week. Compliant with meds. Someone comes in 2x daily to check on him to make him food. Increased weakness, swelling to BIL upper and lower extremities Right lower > left   Patient seems hard of hearing, difficult to obtain history.  HPI     Home Medications Prior to Admission medications   Medication Sig Start Date End Date Taking? Authorizing Provider  amiodarone (PACERONE) 200 MG tablet Take 1/2 (one-half) tablet by mouth once daily 12/22/21  Yes Crenshaw, Madolyn Frieze, MD  aspirin EC 81 MG EC tablet Take 1 tablet (81 mg total) by mouth daily. 10/07/12  Yes Edmisten, Brooke O, PA-C  atorvastatin (LIPITOR) 10 MG tablet Take 10 mg by mouth at bedtime.   Yes [provider]  dorzolamide-timolol (COSOPT) 22.3-6.8 MG/ML ophthalmic solution Place 1 drop into both eyes 2 (two) times daily. 09/21/21  Yes [provider]  doxazosin (CARDURA) 4 MG tablet Take 2 mg by mouth at bedtime.   Yes [provider]  glipiZIDE (GLUCOTROL) 5 MG  tablet Take 2.5 mg by mouth daily. 05/11/22  Yes [provider]  latanoprost (XALATAN) 0.005 % ophthalmic solution Place 1 drop into both eyes every evening. 10/17/21  Yes [provider]  levothyroxine (SYNTHROID) 88 MCG tablet Take 88 mcg by mouth daily. 11/15/21  Yes [provider]  losartan (COZAAR) 50 MG tablet Take 1 tablet by mouth once daily 01/04/22  Yes Crenshaw, Madolyn Frieze, MD  nitroGLYCERIN (NITROSTAT) 0.4 MG SL tablet Place 1 tablet (0.4 mg total) under the tongue every 5 (five) minutes x 3 doses as needed for chest pain. 10/07/12  Yes Edmisten, Brooke O, PA-C  potassium chloride SA (KLOR-CON) 20 MEQ tablet Take 1 tablet (20 mEq total) by mouth daily. Patient taking differently: Take 10 mEq by mouth daily. 07/02/20  Yes Vassie Loll, MD  torsemide [facility]) 20 MG tablet Take one tablet every other day 11/06/21  Yes Crenshaw, Madolyn Frieze, MD  ammonium lactate (AMLACTIN) 12 % cream Apply topically 2 (two) times daily. 06/07/22   [provider]  EUTHYROX 75 MCG tablet Take 75 mcg by mouth daily. 09/01/20   [provider]      Allergies    Patient has no known allergies.    Review of Systems   Review of Systems  Constitutional: Negative.   HENT: Negative.    Respiratory:  Positive for shortness of breath (Denies PND< orthopnea).   Cardiovascular:  Positive for leg swelling. Negative for chest pain and palpitations.  Gastrointestinal: Negative.   Genitourinary: Negative.   Musculoskeletal: Negative.   Skin: Negative.   Neurological: Negative.   All other systems reviewed and are negative.   Physical Exam Updated Vital Signs BP (!) 132/92   Pulse (!) 59   Temp 97.7 F (36.5 C) (Oral)   Resp 17   Ht 5\' 11"  (1.803 m)   Wt 68.9 kg   SpO2 98%   BMI 21.20 kg/m  Physical Exam Vitals and nursing note reviewed.  Constitutional:      General: He is not in acute distress.    Appearance: He is well-developed. He is ill-appearing (chronically  ill appearing). He is not toxic-appearing or diaphoretic.  HENT:     Head: Normocephalic and atraumatic.     Nose: Nose normal.     Mouth/Throat:     Mouth: Mucous membranes are moist.  Eyes:     Pupils: Pupils are equal, round, and reactive to light.  Cardiovascular:     Rate and Rhythm: Normal rate and regular rhythm.     Pulses:          Radial pulses are 1+ on the right side and 1+ on the left side.       Dorsalis pedis pulses are 1+ on the right side and 1+ on the left side.     Heart sounds: Normal heart sounds.  Pulmonary:     Effort: Pulmonary effort is normal. No respiratory distress.     Breath sounds: Normal breath sounds.     Comments: Mild course rales at bases. Freq clear throat Chest:     Comments: Old midline substernal scar.  No overlying erythema or warmth. Abdominal:     General: Bowel sounds are normal. There is no distension.     Palpations: Abdomen is soft.     Tenderness: There is no abdominal tenderness. There is no right CVA tenderness, left CVA tenderness or guarding.  Genitourinary:    Comments: Skin break down Musculoskeletal:        General: Normal range of motion.     Cervical back: Normal range of motion and neck supple.     Comments: Tender bilateral lower extremities.  Right lower extremity edema mid calf to distal, overlying erythema coloring.  Chronic venous stasis skin changes bilateral lower extremities  Skin:    General: Skin is warm and dry.     Capillary Refill: Capillary refill takes less than 2 seconds.     Findings: Erythema present.  Neurological:     General: No focal deficit present.     Mental Status: He is alert and oriented to person, place, and time.     Comments: Intact sensation Generalized weakness    ED Results / Procedures / Treatments   Labs (all labs ordered are listed, but only abnormal results are displayed) Labs Reviewed  CBC WITH DIFFERENTIAL/PLATELET - Abnormal; Notable for the following components:       Result Value   RBC 3.80 (*)    Hemoglobin 11.9 (*)    HCT 36.7 (*)    Platelets 124 (*)    All other components within normal limits  BASIC METABOLIC PANEL - Abnormal; Notable for the following components:   Glucose, Bld 187 (*)    BUN 43 (*)    Creatinine, Ser 1.62 (*)    Calcium 8.5 (*)    GFR, Estimated 38 (*)    All other components within normal  limits  BRAIN NATRIURETIC PEPTIDE - Abnormal; Notable for the following components:   B Natriuretic Peptide 2,282.0 (*)    All other components within normal limits  URINALYSIS, ROUTINE W REFLEX MICROSCOPIC - Abnormal; Notable for the following components:   Color, Urine STRAW (*)    Leukocytes,Ua SMALL (*)    Bacteria, UA RARE (*)    All other components within normal limits  CBG MONITORING, ED - Abnormal; Notable for the following components:   Glucose-Capillary 192 (*)    All other components within normal limits  TROPONIN I (HIGH SENSITIVITY) - Abnormal; Notable for the following components:   Troponin I (High Sensitivity) 27 (*)    All other components within normal limits  TROPONIN I (HIGH SENSITIVITY) - Abnormal; Notable for the following components:   Troponin I (High Sensitivity) 27 (*)    All other components within normal limits  RESP PANEL BY RT-PCR (FLU A&B, COVID) [id number]    EKG None  Radiology US Venous Img Lower Unilateral Right  Result Date: 06/16/2022 CLINICAL DATA:  Right lower extremity edema. EXAM: RIGHT LOWER EXTREMITY VENOUS DOPPLER ULTRASOUND TECHNIQUE: Gray-scale sonography with graded compression, as well as color Doppler and duplex ultrasound were performed to evaluate the lower extremity deep venous systems from the level of the common femoral vein and including the common femoral, femoral, profunda femoral, popliteal and calf veins including the posterior tibial, peroneal and gastrocnemius veins when visible. The superficial great saphenous vein was also interrogated. Spectral Doppler was utilized to  evaluate flow at rest and with distal augmentation maneuvers in the common femoral, femoral and popliteal veins. COMPARISON:  None Available. FINDINGS: Contralateral Common Femoral Vein: Respiratory phasicity is normal and symmetric with the symptomatic side. No evidence of thrombus. Normal compressibility. Common Femoral Vein: No evidence of thrombus. Normal compressibility, respiratory phasicity and response to augmentation. Saphenofemoral Junction: No evidence of thrombus. Normal compressibility and flow on color Doppler imaging. Profunda Femoral Vein: No evidence of thrombus. Normal compressibility and flow on color Doppler imaging. Femoral Vein: No evidence of thrombus. Normal compressibility, respiratory phasicity and response to augmentation. Popliteal Vein: No evidence of thrombus. Normal compressibility, respiratory phasicity and response to augmentation. Calf Veins: No evidence of thrombus. Normal compressibility and flow on color Doppler imaging. Superficial Great Saphenous Vein: No evidence of thrombus. Normal compressibility. Venous Reflux:  None. Other Findings: No evidence of superficial thrombophlebitis or abnormal fluid collection. IMPRESSION: No evidence of right lower extremity deep venous thrombosis. Electronically Signed   By: Irish Lack M.D.   On: 06/16/2022 15:04   DG Chest Port 1 View  Result Date: 06/16/2022 CLINICAL DATA:  Leg edema EXAM: PORTABLE CHEST 1 VIEW COMPARISON:  Chest 12/23/2021 FINDINGS: Prior median sternotomy and CABG. Cardiac enlargement. Negative for heart failure or edema. Small left pleural effusion with improvement. No significant right effusion Mild bibasilar atelectasis. IMPRESSION: 1. Cardiac enlargement without heart failure. 2. Small left pleural effusion with improvement. Electronically Signed   By: Marlan Palau M.D.   On: 06/16/2022 14:37    Procedures Procedures    Medications Ordered in ED Medications  furosemide (LASIX) injection 60 mg (60 mg  Intravenous Given 06/16/22 1536)    ED Course/ Medical Decision Making/ A&P    88 year old known history of CHF with a EF of 25%, CAD, SVT for evaluation worsening shortness of breath as well as extremity edema.  Shortness of breath over the last few weeks however worsened over the last few days.  Son went to check on  patient noted worsening swelling, right lower extremity greater than left as well some swelling to his hands.  Apparently patient is been compliant with his meds.  Typically lives on his own and someone checks in on him to make him some food.  Mild cough. No fever, overall decrease in appetite. Decreased in ambulation.  Does have some edema, right greater than left.  Doppler pulses.  Does not appear to have cellulitis on exam however does have some erythema to his right lower extremity no warmth, nontender.  Has some mild rales at bases.  Apparently noted to be hypoxic at home.  On arrival here he does appear to have overall poor circulation to all of his extremities however no ischemia.  We are able to place monitor for oxygen level to ear which was low, mid 90s.  He does have moments when this comes up however generally hovers around 92% while in the room. Labs and imaging and reassess  Labs and imaging personally viewed and interpreted:  CBC without leukocytosis, hemoglobin at 11.9, similar to prior Metabolic panel glucose 187, creatinine 1.62, similar to his most recent labs Troponin 27 unclear baseline as only prior or labs during CHF exacerbation with demand ischemia BNP 2282, unsure baseline only one to compare was with prior CHF exacerbation Chest x-ray with cardiomegaly, does have small effusion however improved from prior imaging however is again compared to prior admission for CHF Ultrasound negative for DVT on right EKG with bifascicular block, seems similar to prior  1615: Went to reassess patient. He was attempting to sit up. His oxygen was 89 to 92%  Placed on 2 L via  nasal cannula.  Will admit for further management.  CONSULT with Dr. Mariea Clonts with [facility] who is agreeable to evaluate patient for admission.  Patient given Lasix via IV in ED with good urine output  The patient appears reasonably stabilized for admission considering the current resources, flow, and capabilities available in the ED at this time, and I doubt any other [facility] requiring further screening and/or treatment in the ED prior to admission.   Attempted to contact son Ebony Rickel at [phone] without answer x 2                           Medical Decision Making Amount and/or Complexity of Data Reviewed Independent Historian: EMS External Data Reviewed: labs, radiology, ECG and notes. Labs: ordered. Decision-making details documented in ED Course. Radiology: ordered and independent interpretation performed. Decision-making details documented in ED Course. ECG/medicine tests: ordered and independent interpretation performed. Decision-making details documented in ED Course.  Risk OTC drugs. Prescription drug management. Parenteral controlled substances. Decision regarding hospitalization. Diagnosis or treatment significantly limited by social determinants of health.          Final Clinical Impression(s) / ED Diagnoses Final diagnoses:  Weakness  Acute on chronic congestive heart failure, unspecified heart failure type (HCC)  Acute respiratory failure with hypoxia [facility])    Rx / DC Orders ED Discharge Orders     None         Erik Burkett A, PA-C 06/16/22 1847    Kommor, Wyn Forster, MD 06/16/22 2147

## 2022-06-16 NOTE — Assessment & Plan Note (Signed)
CKD stage IIIb.  Creatinine 1.6 at baseline.

## 2022-06-16 NOTE — ED Notes (Signed)
Replaced SpO2 monitoring back on ear and pt is 100% on RA

## 2022-06-16 NOTE — ED Triage Notes (Signed)
Pt arrived via [facility] from home with c/o generalized swelling, especially in the extremities, for a while now. Hx of CHF and stent/heart surgery

## 2022-06-17 ENCOUNTER — Other Ambulatory Visit (HOSPITAL_COMMUNITY): Payer: Self-pay | Admitting: *Deleted

## 2022-06-17 ENCOUNTER — Inpatient Hospital Stay (HOSPITAL_COMMUNITY): Payer: Medicare Other

## 2022-06-17 ENCOUNTER — Encounter (HOSPITAL_COMMUNITY): Payer: Self-pay | Admitting: Internal Medicine

## 2022-06-17 DIAGNOSIS — I2510 Atherosclerotic heart disease of native coronary artery without angina pectoris: Secondary | ICD-10-CM

## 2022-06-17 DIAGNOSIS — I5041 Acute combined systolic (congestive) and diastolic (congestive) heart failure: Secondary | ICD-10-CM

## 2022-06-17 DIAGNOSIS — I471 Supraventricular tachycardia, unspecified: Secondary | ICD-10-CM

## 2022-06-17 DIAGNOSIS — R338 Other retention of urine: Secondary | ICD-10-CM

## 2022-06-17 DIAGNOSIS — I5043 Acute on chronic combined systolic (congestive) and diastolic (congestive) heart failure: Secondary | ICD-10-CM

## 2022-06-17 DIAGNOSIS — I2583 Coronary atherosclerosis due to lipid rich plaque: Secondary | ICD-10-CM

## 2022-06-17 DIAGNOSIS — R9431 Abnormal electrocardiogram [ECG] [EKG]: Secondary | ICD-10-CM

## 2022-06-17 DIAGNOSIS — I10 Essential (primary) hypertension: Secondary | ICD-10-CM

## 2022-06-17 DIAGNOSIS — N1832 Chronic kidney disease, stage 3b: Secondary | ICD-10-CM

## 2022-06-17 DIAGNOSIS — E119 Type 2 diabetes mellitus without complications: Secondary | ICD-10-CM | POA: Diagnosis not present

## 2022-06-17 LAB — ECHOCARDIOGRAM COMPLETE
Area-P 1/2: 4.24 cm2
Height: 71 in
S' Lateral: 5.3 cm
Single Plane A4C EF: 17 %
Weight: 2553.81 oz

## 2022-06-17 LAB — GLUCOSE, CAPILLARY
Glucose-Capillary: 143 mg/dL — ABNORMAL HIGH (ref 70–99)
Glucose-Capillary: 189 mg/dL — ABNORMAL HIGH (ref 70–99)
Glucose-Capillary: 137 mg/dL — ABNORMAL HIGH (ref 70–99)
Glucose-Capillary: 138 mg/dL — ABNORMAL HIGH (ref 70–99)

## 2022-06-17 LAB — BASIC METABOLIC PANEL
Anion gap: 8 (ref 5–15)
BUN: 41 mg/dL — ABNORMAL HIGH (ref 8–23)
CO2: 22 mmol/L (ref 22–32)
Calcium: 8.6 mg/dL — ABNORMAL LOW (ref 8.9–10.3)
Chloride: 111 mmol/L (ref 98–111)
Creatinine, Ser: 1.49 mg/dL — ABNORMAL HIGH (ref 0.61–1.24)
GFR, Estimated: 42 mL/min — ABNORMAL LOW (ref >60)
Glucose, Bld: 143 mg/dL — ABNORMAL HIGH (ref 70–99)
Potassium: 3.8 mmol/L (ref 3.5–5.1)
Sodium: 141 mmol/L (ref 135–145)

## 2022-06-17 LAB — HEMOGLOBIN A1C
Hgb A1c MFr Bld: 8.4 % — ABNORMAL HIGH (ref 4.8–5.6)
Mean Plasma Glucose: 194.38 mg/dL

## 2022-06-17 MED ORDER — LIVING BETTER WITH HEART FAILURE BOOK: Freq: Once | Status: AC

## 2022-06-17 MED ORDER — POLYETHYLENE GLYCOL 3350 17 G PO PACK: 17.0000 g | PACK | Freq: Two times a day (BID) | ORAL | Status: DC | PRN

## 2022-06-17 MED ORDER — INFLUENZA VAC A&B SA ADJ QUAD 0.5 ML IM PRSY
0.5000 mL | PREFILLED_SYRINGE | INTRAMUSCULAR | Status: AC
  Administered 2022-06-18: 0.5 mL via INTRAMUSCULAR
  Filled 2022-06-17: qty 0.5

## 2022-06-17 MED ORDER — SENNOSIDES-DOCUSATE SODIUM 8.6-50 MG PO TABS: 1.0000 | ORAL_TABLET | Freq: Two times a day (BID) | ORAL | Status: DC | PRN

## 2022-06-17 NOTE — Progress Notes (Signed)
Pt having pain and some distention in the lower abdomen, orders to insert foley catheter after bladder scan of 637 mL. 16 Fr foley inserted with sterile technique, pt immediately released 600 mL into urine bag. Urine is yellow and clear in color. Pt expressed relief and states he feels alot better. MD notified of output and recorded, will continue to monitor.

## 2022-06-17 NOTE — Progress Notes (Signed)
  Echocardiogram 2D Echocardiogram has been performed.  Franklin Gonzalez 06/17/2022, 10:28 AM

## 2022-06-17 NOTE — TOC Progression Note (Signed)
  Transition of Care [facility]) Screening Note   Patient Details  Name: Franklin Gonzalez Date of Birth: 1922-04-26   Transition of Care [facility]) CM/SW Contact:    Shade Flood, LCSW Phone Number: 06/17/2022, 9:12 AM    CHF educational book ordered for delivery to bedside. TOC will refer for HH if needed at dc.  Transition of Care Department [facility]) has reviewed patient and no TOC needs have been identified at this time. We will continue to monitor patient advancement through interdisciplinary progression rounds. If new patient transition needs arise, please place a TOC consult.

## 2022-06-17 NOTE — Consult Note (Addendum)
Cardiology Consultation   Patient ID: Franklin Gonzalez MRN: [id number]; DOB: 05-08-1922  Admit date: 06/16/2022 Date of Consult: 06/17/2022  PCP:  Franklin Nation, MD   [place] Providers Cardiologist:  Franklin Ruths, MD        Patient Profile:   Franklin Gonzalez is a 88 y.Gonzalez. male with a hx of CAD (s/p CABG in 1993, PCI of SVG-RCA and SVG-OM1 in 2014), HFrEF (EF 45-50% in 09/2012 and at 25% in 06/2020), SVT, carotid artery stenosis, HTN, HLD and Type 2 DM who is being seen 06/17/2022 for the evaluation of CHF at the request of Franklin Gonzalez.  History of Present Illness:   Franklin Gonzalez was last examined by Franklin Gonzalez in 05/2022 and did report a recent fall but denied any anginal symptoms. He was continued on his current cardiac medications including Amiodarone 100 mg daily, ASA 81 mg daily, Atorvastatin 10 mg daily, Cardura 2 mg daily, Losartan 50 mg daily and Torsemide 20 mg every other day. Conservative management of his cardiomyopathy had previously been recommended given his age and he had previously been unable to tolerate Entresto and was not on beta-blocker therapy given history of bradycardia.  He presented to [facility] ED on 06/16/2022 for evaluation of worsening lower extremity edema and dyspnea for the past few days. He reports living by himself but has family members and a caregiver that check on him on a daily basis.  They help with meals and he also has Meals on Wheels. Over the past week, he has experienced worsening dyspnea on exertion, orthopnea and lower extremity edema.  Reports compliance with Torsemide but symptoms continued to progress. He denies any specific chest pain or palpitations.  Oxygen saturations were initially 89% on room air upon arrival to the ED. Initial labs showed WBC 5.3, Hgb 11.9, platelets 124, Na+ 139, K+ 4.2 and creatinine 1.62 (baseline 1.4 - 1.5). Mg 2.4. BNP elevated to 2282. TSH 1.803. Hs Troponin values have been flat at 27.  CXR shows cardiac enlargement without heart failure. Noted to have a small left pleural effusion. EKG shows baseline artifact and a computer generated read of atrial fibrillation but appears to possibly be sinus rhythm with 1st degree AV block and known RBBB and LAFB.    He received IV Lasix 60mg  while in the ED and has been started on 40mg  BID. Recorded net output of -1.2 L thus far.  Repeat labs this AM show K+ at 3.8 and creatinine has improved to 1.49.  This morning, he was having significant pain in the setting of urinary retention and this improved with placement of a Foley catheter.  Past Medical History:  Diagnosis Date   Bifascicular block    Coronary artery disease    CABG 1993 at [facility]; NSTEMI 09/2012 s/p DES to SVG-OM1, DES to SVG-PDA    DM (diabetes mellitus) ([place])    HTN (hypertension)    SVT (supraventricular tachycardia)     Past Surgical History:  Procedure Laterality Date   [place] CABG   LEFT HEART CATHETERIZATION WITH CORONARY/GRAFT ANGIOGRAM N/A 10/04/2012   Procedure: LEFT HEART CATHETERIZATION WITH Franklin Gonzalez;  Surgeon: Franklin Mocha, MD;  Location: [facility] CATH LAB;  Service: Cardiovascular;  Laterality: N/A;     Home Medications:  Prior to Admission medications   Medication Sig Start Date End Date Taking? Authorizing Provider  amiodarone (PACERONE) 200 MG tablet Take 1/2 (one-half) tablet by mouth once daily 12/22/21  Yes  Franklin Bunting, MD  aspirin EC 81 MG EC tablet Take 1 tablet (81 mg total) by mouth daily. 10/07/12  Yes Edmisten, Brooke O, PA-C  atorvastatin (LIPITOR) 10 MG tablet Take 10 mg by mouth at bedtime.   Yes [provider]  dorzolamide-timolol (COSOPT) 22.3-6.8 MG/ML ophthalmic solution Place 1 drop into both eyes 2 (two) times daily. 09/21/21  Yes [provider]  doxazosin (CARDURA) 4 MG tablet Take 2 mg by mouth at bedtime.   Yes [provider]  glipiZIDE (GLUCOTROL) 5 MG tablet  Take 2.5 mg by mouth daily. 05/11/22  Yes [provider]  latanoprost (XALATAN) 0.005 % ophthalmic solution Place 1 drop into both eyes every evening. 10/17/21  Yes [provider]  levothyroxine (SYNTHROID) 88 MCG tablet Take 88 mcg by mouth daily. 11/15/21  Yes [provider]  losartan (COZAAR) 50 MG tablet Take 1 tablet by mouth once daily 01/04/22  Yes Crenshaw, Madolyn Frieze, MD  nitroGLYCERIN (NITROSTAT) 0.4 MG SL tablet Place 1 tablet (0.4 mg total) under the tongue every 5 (five) minutes x 3 doses as needed for chest pain. 10/07/12  Yes Edmisten, Brooke O, PA-C  potassium chloride SA (KLOR-CON) 20 MEQ tablet Take 1 tablet (20 mEq total) by mouth daily. Patient taking differently: Take 10 mEq by mouth daily. 07/02/20  Yes Franklin Loll, MD  torsemide [facility]) 20 MG tablet Take one tablet every other day 11/06/21  Yes Crenshaw, Madolyn Frieze, MD  ammonium lactate (AMLACTIN) 12 % cream Apply topically 2 (two) times daily. 06/07/22   [provider]  EUTHYROX 75 MCG tablet Take 75 mcg by mouth daily. 09/01/20   [provider]    Inpatient Medications: Scheduled Meds:  amiodarone  100 mg Oral Daily   aspirin EC  81 mg Oral Daily   atorvastatin  10 mg Oral QHS   dorzolamide-timolol  1 drop Both Eyes BID   doxazosin  2 mg Oral QHS   enoxaparin (LOVENOX) injection  30 mg Subcutaneous Q24H   furosemide  40 mg Intravenous Q12H   [START ON 06/18/2022] influenza vaccine adjuvanted  0.5 mL Intramuscular Tomorrow-1000   insulin aspart  0-5 Units Subcutaneous QHS   insulin aspart  0-9 Units Subcutaneous TID WC   latanoprost  1 drop Both Eyes QPM   levothyroxine  88 mcg Oral Q0600   losartan  50 mg Oral Daily   nystatin   Topical TID   Continuous Infusions:  PRN Meds: acetaminophen **OR** acetaminophen, polyethylene glycol  Allergies:   No Known Allergies  Social History:   Social History   Socioeconomic History   Marital status: Widowed    Spouse name: Not  on file   Number of children: Not on file   Years of education: college- BS   Highest education level: Bachelor's degree (e.g., BA, AB, BS)  Occupational History   Occupation: retired    Comment: was in [employer]  Tobacco Use   Smoking status: Never   Smokeless tobacco: Never  Vaping Use   Vaping Use: Never used  Substance and Sexual Activity   Alcohol use: No    Alcohol/week: 0.0 standard drinks of alcohol   Drug use: No   Sexual activity: Not on file  Other Topics Concern   Not on file  Social History Narrative   wife passed in January 2021   son [name] is the primary care giver prn   Retired from [employer] with a BS degree   Social Determinants  of Health   Financial Resource Strain: Low Risk  (07/25/2020)   Overall Financial Resource Strain (CARDIA)    Difficulty of Paying Living Expenses: Not hard at all  Food Insecurity: No Food Insecurity (06/17/2022)   Hunger Vital Sign    Worried About Running Out of Food in the Last Year: Never true    Ran Out of Food in the Last Year: Never true  Transportation Needs: No Transportation Needs (06/17/2022)   PRAPARE - Hydrologist (Medical): No    Lack of Transportation (Non-Medical): No  Physical Activity: Not on file  Stress: No Stress Concern Present (07/25/2020)   [place]    Feeling of Stress : Not at all  Social Connections: Moderately Integrated (07/25/2020)   Social Connection and Isolation Panel [NHANES]    Frequency of Communication with Friends and Family: More than three times a week    Frequency of Social Gatherings with Friends and Family: More than three times a week    Attends Religious Services: 1 to 4 times per year    Active Member of [employer] or Organizations: Yes    Attends Archivist Meetings: 1 to 4 times per year    Marital Status: Widowed  Intimate Partner Violence: Not At Risk (06/17/2022)    Humiliation, Afraid, Rape, and Kick questionnaire    Fear of Current or Ex-Partner: No    Emotionally Abused: No    Physically Abused: No    Sexually Abused: No    Family History:    Family History  Problem Relation Age of Onset   CAD Other        multiple relatives including father and son     ROS:  Please see the history of present illness.   All other ROS reviewed and negative.     Physical Exam/Data:   Vitals:   06/17/22 0300 06/17/22 0353 06/17/22 0354 06/17/22 0749  BP: 117/73  117/74 (!) 132/91  Pulse: 71  65 69  Resp: 17  20   Temp:   97.8 F (36.6 C)   TempSrc:   Oral   SpO2: 93%  97% 100%  Weight:  72.4 kg    Height:  5\' 11"  (1.803 m)      Intake/Output Summary (Last 24 hours) at 06/17/2022 1051 Last data filed at 06/16/2022 2255 Gross per 24 hour  Intake --  Output 850 ml  Net -850 ml      06/17/2022    3:53 AM 06/16/2022    3:17 PM 05/12/2022    8:10 AM  Last 3 Weights  Weight (lbs) 159 lb 9.8 oz 152 lb 147 lb 3.2 oz  Weight (kg) 72.4 kg 68.947 kg 66.769 kg     Body mass index is 22.26 kg/m.  General: Pleasant, elderly male appearing in no acute distress. Hard of hearing.  HEENT: normal Neck: JVD elevated to 10-11 cm.  Vascular: No carotid bruits; Distal pulses 2+ bilaterally Cardiac:  normal S1, S2; RRR with ectopic beats. 2/6 SEM along RUSB.  Lungs:  clear to auscultation bilaterally, no wheezing, rhonchi or rales  Abd: soft, nontender, no hepatomegaly  Ext: 2+ pitting edema bilaterally, more prominent along RLE.  Musculoskeletal:  No deformities, BUE and BLE strength normal and equal Skin: warm and dry  Neuro:  CNs 2-12 intact, no focal abnormalities noted Psych:  Normal affect   EKG:  The EKG was personally reviewed and demonstrates: Baseline artifact  and a computer-generated read of atrial fibrillation but appears to possibly be sinus rhythm with 1st degree AV block and known RBBB and LAFB.    Telemetry:  Telemetry was personally  reviewed and demonstrates: Sinus rhythm, HR in 50's to 60's with PAC's and PVC's.   Relevant CV Studies:  Echocardiogram: 06/27/2020 IMPRESSIONS     1. Left ventricular ejection fraction, by estimation, is 25%. The left  ventricle has severely decreased function. The left ventricle demonstrates  global hypokinesis. The left ventricular internal cavity size was mildly  dilated. There is mild left  ventricular hypertrophy. Left ventricular diastolic parameters are  consistent with Grade II diastolic dysfunction (pseudonormalization).  Elevated left atrial pressure.   2. Right ventricular systolic function is mildly reduced. The right  ventricular size is normal.   3. Left atrial size was severely dilated.   4. Right atrial size was mildly dilated.   5. The mitral valve is normal in structure. Mild mitral valve  regurgitation. No evidence of mitral stenosis.   6. The aortic valve is tricuspid. There is moderate calcification of the  aortic valve. There is moderate thickening of the aortic valve. Aortic  valve regurgitation is not visualized. No aortic stenosis is present.   7. The inferior vena cava is dilated in size with >50% respiratory  variability, suggesting right atrial pressure of 8 mmHg.   Laboratory Data:  High Sensitivity Troponin:   Recent Labs  Lab 06/16/22 1411 06/16/22 1549  TROPONINIHS 27* 27*     Chemistry Recent Labs  Lab 06/16/22 1411 06/16/22 1549 06/17/22 0504  NA 139  --  141  K 4.2  --  3.8  CL 110  --  111  CO2 22  --  22  GLUCOSE 187*  --  143*  BUN 43*  --  41*  CREATININE 1.62*  --  1.49*  CALCIUM 8.5*  --  8.6*  MG  --  2.4  --   GFRNONAA 38*  --  42*  ANIONGAP 7  --  8    No results for input(s): "PROT", "ALBUMIN", "AST", "ALT", "ALKPHOS", "BILITOT" in the last 168 hours. Lipids No results for input(s): "CHOL", "TRIG", "HDL", "LABVLDL", "LDLCALC", "CHOLHDL" in the last 168 hours.  Hematology Recent Labs  Lab 06/16/22 1411  WBC 5.3   RBC 3.80*  HGB 11.9*  HCT 36.7*  MCV 96.6  MCH 31.3  MCHC 32.4  RDW 15.1  PLT 124*   Thyroid  Recent Labs  Lab 06/16/22 1411  TSH 1.803    BNP Recent Labs  Lab 06/16/22 1411  BNP 2,282.0*    DDimer No results for input(s): "DDIMER" in the last 168 hours.   Radiology/Studies:  US Venous Img Lower Unilateral Right  Result Date: 06/16/2022 CLINICAL DATA:  Right lower extremity edema. EXAM: RIGHT LOWER EXTREMITY VENOUS DOPPLER ULTRASOUND TECHNIQUE: Gray-scale sonography with graded compression, as well as color Doppler and duplex ultrasound were performed to evaluate the lower extremity deep venous systems from the level of the common femoral vein and including the common femoral, femoral, profunda femoral, popliteal and calf veins including the posterior tibial, peroneal and gastrocnemius veins when visible. The superficial great saphenous vein was also interrogated. Spectral Doppler was utilized to evaluate flow at rest and with distal augmentation maneuvers in the common femoral, femoral and popliteal veins. COMPARISON:  None Available. FINDINGS: Contralateral Common Femoral Vein: Respiratory phasicity is normal and symmetric with the symptomatic side. No evidence of thrombus. Normal compressibility. Common Femoral Vein:  No evidence of thrombus. Normal compressibility, respiratory phasicity and response to augmentation. Saphenofemoral Junction: No evidence of thrombus. Normal compressibility and flow on color Doppler imaging. Profunda Femoral Vein: No evidence of thrombus. Normal compressibility and flow on color Doppler imaging. Femoral Vein: No evidence of thrombus. Normal compressibility, respiratory phasicity and response to augmentation. Popliteal Vein: No evidence of thrombus. Normal compressibility, respiratory phasicity and response to augmentation. Calf Veins: No evidence of thrombus. Normal compressibility and flow on color Doppler imaging. Superficial Great Saphenous Vein: No  evidence of thrombus. Normal compressibility. Venous Reflux:  None. Other Findings: No evidence of superficial thrombophlebitis or abnormal fluid collection. IMPRESSION: No evidence of right lower extremity deep venous thrombosis. Electronically Signed   By: Aletta Edouard M.D.   On: 06/16/2022 15:04   DG Chest Port 1 View  Result Date: 06/16/2022 CLINICAL DATA:  Leg edema EXAM: PORTABLE CHEST 1 VIEW COMPARISON:  Chest 12/23/2021 FINDINGS: Prior median sternotomy and CABG. Cardiac enlargement. Negative for heart failure or edema. Small left pleural effusion with improvement. No significant right effusion Mild bibasilar atelectasis. IMPRESSION: 1. Cardiac enlargement without heart failure. 2. Small left pleural effusion with improvement. Electronically Signed   By: Franchot Gallo M.D.   On: 06/16/2022 14:37     Assessment and Plan:   1. Acute on chronic combined heart failure - He has a known cardiomyopathy with EF at 25% in 06/2020 and conservative management has been pursued in the setting of his advanced age. BNP was elevated to 2282 on admission and CXR showed a small left pleural effusion.  Repeat echocardiogram is pending for reassessment of his EF. - He has been started on IV Lasix 40 mg twice daily and is responding well to this. Creatinine is improving as this was at 1.62 on admission and is down to 1.49.  Would continue with IV Lasix at current dosing. He has been continued on Losartan 50 mg daily. As outlined above, he was previously intolerant to [facility] and has not been on beta-blocker therapy due to baseline bradycardia. Would not anticipate the addition of an SGLT2 inhibitor given his age and areas of skin breakdown as risk of infection would be higher. Could consider adding low-dose Spironolactone. He was taking Torsemide 20 mg every other day prior to admission and anticipate this should be adjusted to 20 mg daily at discharge.  2. CAD - He is s/p CABG in 1993 with PCI of SVG-RCA and  SVG-OM1 in 2014. Repeat ischemic evaluation has not been pursued given his age. Troponin values have been flat at 27 which is not consistent with ACS. - Continue ASA 81 mg daily, Atorvastatin 10 mg daily and Losartan 50 mg daily.  3. SVT - No evidence of recurrence by telemetry this admission. He has been continued on Amiodarone 100 mg daily. TSH was normal at 1.803 this admission (on Synthroid) and will check LFT's with AM labs.  4. Prolonged QT - This was prolonged on his EKG on admission with similar values by calculated corrected QT. Will obtain a repeat 12-Lead EKG.   5. HTN - His BP has overall been well controlled since admission, at 108/82 to 132/92. He has been continued on PTA Cardura and Losartan.   6. Stage 3 CKD - Baseline creatinine appears to be around 1.4 - 1.5. Was elevated to 1.62 on admission and has improved to 1.49 today.  Continue to follow with diuresis.    Risk Assessment/Risk Scores:      New York Heart Association (NYHA)  Functional Class NYHA Class IV   For questions or updates, please contact [place] Please consult [email or web address] for contact info under    Signed, Erma Heritage, PA-C  06/17/2022 10:51 AM   Patient seen and examined.  Agree with above documentation.  Franklin Gonzalez is a 88 year old male with a history of CAD status post CABG in 1993, chronic combined systolic and diastolic heart failure (EF 25% 06/2020), SVT, hypertension, carotid artery stenosis, T2DM who we are consulted for evaluation of heart failure at the request of Franklin Gonzalez.  He presents with worsening shortness of breath and lower extremity edema.  On presentation to the ED, initial vital signs notable for SPO2 100% on room air, BP 118/64, pulse 55.  Chest x-ray notable for small left pleural effusion, cardiac enlargement without heart failure.  Labs notable for creatinine 1.6, BNP 2282, troponin 27 > 27, WBC 5.3, hemoglobin 11.9, platelets 124, TSH 1.8.  EKG  appears sinus rhythm with first-degree AV block, rate 58, right bundle branch block, left anterior fascicular block.  On exam, patient is alert, bradycardic, regular, no murmurs, expiratory wheezing, 1+ RLE edema, trace LLE edema, + JVD.  Presentation consistent with acute on chronic combined heart failure.  Improving with IV diuresis, would continue.  Will follow-up echocardiogram.  Not a candidate for aggressive cardiac therapies given his age.  Donato Heinz, MD

## 2022-06-17 NOTE — Progress Notes (Signed)
PROGRESS NOTE  Franklin Gonzalez [id number] DOB: [birth year]   PCP: [facility], MD  Patient is from: Home.  Lives alone.  Has aides   DOA: 06/16/2022 LOS: 1  Chief complaints Chief Complaint  Patient presents with   Leg Swelling     Brief Narrative / Interim history: 88 year old M with PMH of combined CHF, CAD/CABG 1993, CKD-3B, PAD, A-fib not on AC, hypothyroidism and DM-2 brought to ED by EMS due to bilateral upper and lower extremity swelling/edema, and admitted for acute on chronic combined CHF.  Patient is on torsemide 20 mg every other day.  Reports good compliance with diuretics but admits to fluid indiscretion.  Had no respiratory symptoms.  RLE venous Doppler negative for DVT.  BNP elevated to 2282.  CXR with small left pleural effusion.  EKG shows atrial fibrillation at 58/min and QTc of 527.  Patient was started on IV Lasix and admitted.  Subjective: Seen and examined earlier this morning.  Patient's son at bedside.  Concern about difficulty urinating and pain/discomfort.  Bladder scan over 600.  Patient denies chest pain, dyspnea, nausea or vomiting.  About 800 cc clean looking urine in urine canister on the wall.   Objective: Vitals:   06/17/22 0300 06/17/22 0353 06/17/22 0354 06/17/22 0749  BP: 117/73  117/74 (!) 132/91  Pulse: 71  65 69  Resp: 17  20   Temp:   97.8 F (36.6 C)   TempSrc:   Oral   SpO2: 93%  97% 100%  Weight:  72.4 kg    Height:  5\' 11"  (1.803 m)      Examination:  GENERAL: No apparent distress.  Nontoxic. HEENT: MMM.  Vision and hearing grossly intact.  NECK: Supple.  No apparent JVD.  RESP:  No IWOB.  Fair aeration bilaterally. CVS:  RRR. Heart sounds normal.  ABD/GI/GU: BS+. Abd soft.  Suprapubic tenderness or fullness. MSK/EXT:  Moves extremities.  BLE edema, right> left.  Faint DP pulses bilaterally. SKIN: no apparent skin lesion or wound NEURO: Awake, alert and oriented appropriately.  No apparent focal neuro deficit. PSYCH:  Calm. Normal affect.   Procedures:  None  Microbiology summarized: [id number] and influenza PCR nonreactive.  Assessment and plan: Principal Problem:   Systolic and diastolic CHF, acute (HCC) Active Problems:   Acute respiratory failure with hypoxia (HCC)   SVT (supraventricular tachycardia) (HCC)   HTN (hypertension)   CAD (coronary artery disease)   Chronic renal insufficiency, stage III (moderate) (HCC)   Benign essential hypertension   Type 2 diabetes mellitus without complications (HCC)   Acute on chronic congestive heart failure (HCC)   Prolonged QT interval  Acute on chronic combined CHF: present with all extremity edema.  No chest pain or dyspnea.  CXR with trace bilateral pleural effusion.  BNP elevated to 2282 but no recent values for comparison.  Started on IV Lasix.  About 850 cc UOP overnight.  However, he seems to have urinary retention with bladder scan showing over 600.  Creatinine improving.  Still with BLE edema, right greater than left. -Cardiology on board-recommendation pending. -Continue IV Lasix 40 mg twice daily -Insert Foley catheter to monitor urine output given urinary retention. -Monitor renal functions, electrolytes -Follow echocardiogram -Counseled on fluid restriction moving forward  Acute respiratory failure with hypoxia: Desaturated 89% on room air with mild exertion.  Likely due to the above.  CXR showed trace pleural effusion.  Currently saturating in upper 90s on room air. -Manage CHF as above -Encourage incentive  spirometry  Persistent A-fib: Rate controlled.  On amiodarone.  Not on anticoagulation but low-dose aspirin. -Continue home amiodarone and aspirin  History of CAD/CABG 1993/PAD: No chest pain.  Mildly elevated troponin likely demand ischemia -Continue home meds. -Follow echocardiogram  Urinary retention: Bladder scan > 600 cc.  Likely BPH although patient and family do not recall history of this.  Seems to be on Cardura at  home. -Insert Foley catheter -Continue home Cardura -Bowel regimen  CKD-3B: Stable. Recent Labs    05/12/22 0900 06/16/22 1411 06/17/22 0504  BUN 49* 43* 41*  CREATININE 1.69* 1.62* 1.49*  -Continue monitoring  Essential hypertension: Normotensive for most part. -Continue losartan, Cardura and Lasix  Prolonged QT: QTc 527.  Exaggerated by wide QRS. -Minimize QT prolonging drugs -Continue telemetry monitoring -Optimize K and Mg  Uncontrolled NIDDM-2 with hyperglycemia: A1c 8.0% in 06/2020. Recent Labs  Lab 06/16/22 1409 06/16/22 2239 06/17/22 0746  GLUCAP 192* 174* 143*  -Continue current insulin regimen and started  Physical deconditioning: Lives alone. -PT/OT eval  Goal of care counseling: Confirmed DNR/DNI with patient's and patient's son at bedside.  Given age and comorbidities, I think he is appropriate at least for home hospice. -Palliative medicine consulted   Body mass index is 22.26 kg/m.           DVT prophylaxis:  enoxaparin (LOVENOX) injection 30 mg Start: 06/16/22 2100  Code Status: DNR/DNI Family Communication: Updated patient's son at bedside Level of care: Telemetry Status is: Inpatient Remains inpatient appropriate because: Acute combined CHF   Final disposition: TBD Consultants:  Cardiology  Sch Meds:  Scheduled Meds:  amiodarone  100 mg Oral Daily   aspirin EC  81 mg Oral Daily   atorvastatin  10 mg Oral QHS   dorzolamide-timolol  1 drop Both Eyes BID   doxazosin  2 mg Oral QHS   enoxaparin (LOVENOX) injection  30 mg Subcutaneous Q24H   furosemide  40 mg Intravenous Q12H   [START ON 06/18/2022] influenza vaccine adjuvanted  0.5 mL Intramuscular Tomorrow-1000   insulin aspart  0-5 Units Subcutaneous QHS   insulin aspart  0-9 Units Subcutaneous TID WC   latanoprost  1 drop Both Eyes QPM   levothyroxine  88 mcg Oral Q0600   losartan  50 mg Oral Daily   nystatin   Topical TID   Continuous Infusions: PRN Meds:.acetaminophen  **OR** acetaminophen, polyethylene glycol, senna-docusate  Antimicrobials: Anti-infectives (From admission, onward)    None        I have personally reviewed the following labs and images: CBC: Recent Labs  Lab 06/16/22 1411  WBC 5.3  NEUTROABS 4.0  HGB 11.9*  HCT 36.7*  MCV 96.6  PLT 124*   BMP &GFR Recent Labs  Lab 06/16/22 1411 06/16/22 1549 06/17/22 0504  NA 139  --  141  K 4.2  --  3.8  CL 110  --  111  CO2 22  --  22  GLUCOSE 187*  --  143*  BUN 43*  --  41*  CREATININE 1.62*  --  1.49*  CALCIUM 8.5*  --  8.6*  MG  --  2.4  --    Estimated Creatinine Clearance: 28.3 mL/min (A) (by C-G formula based on SCr of 1.49 mg/dL (H)). Liver & Pancreas: No results for input(s): "AST", "ALT", "ALKPHOS", "BILITOT", "PROT", "ALBUMIN" in the last 168 hours. No results for input(s): "LIPASE", "AMYLASE" in the last 168 hours. No results for input(s): "AMMONIA" in the last 168 hours. Diabetic:  No results for input(s): "HGBA1C" in the last 72 hours. Recent Labs  Lab 06/16/22 1409 06/16/22 2239 06/17/22 0746  GLUCAP 192* 174* 143*   Cardiac Enzymes: No results for input(s): "CKTOTAL", "CKMB", "CKMBINDEX", "TROPONINI" in the last 168 hours. No results for input(s): "PROBNP" in the last 8760 hours. Coagulation Profile: No results for input(s): "INR", "PROTIME" in the last 168 hours. Thyroid Function Tests: Recent Labs    06/16/22 1411  TSH 1.803   Lipid Profile: No results for input(s): "CHOL", "HDL", "LDLCALC", "TRIG", "CHOLHDL", "LDLDIRECT" in the last 72 hours. Anemia Panel: No results for input(s): "VITAMINB12", "FOLATE", "FERRITIN", "TIBC", "IRON", "RETICCTPCT" in the last 72 hours. Urine analysis:    Component Value Date/Time   COLORURINE STRAW (A) 06/16/2022 [place] 06/16/2022 1548   LABSPEC 1.008 06/16/2022 1548   PHURINE 5.0 06/16/2022 [place] 06/16/2022 1548   HGBUR NEGATIVE 06/16/2022 [place] 06/16/2022 [place] 06/16/2022 1548   PROTEINUR NEGATIVE 06/16/2022 1548   UROBILINOGEN 1.0 10/03/2012 1151   NITRITE NEGATIVE 06/16/2022 1548   LEUKOCYTESUR SMALL (A) 06/16/2022 1548   Sepsis Labs: Invalid input(s): "PROCALCITONIN", "LACTICIDVEN"  Microbiology: Recent Results (from the past 240 hour(s))  Resp Panel by RT-PCR (Flu A&B, Covid) Anterior Nasal Swab     Status: None   Collection Time: 06/16/22  1:57 PM   Specimen: Anterior Nasal Swab  Result Value Ref Range Status   SARS Coronavirus 2 by RT PCR NEGATIVE NEGATIVE Final    Comment: (NOTE) SARS-CoV-2 target nucleic acids are NOT DETECTED.  The SARS-CoV-2 RNA is generally detectable in upper respiratory specimens during the acute phase of infection. The lowest concentration of SARS-CoV-2 viral copies this assay can detect is 138 copies/mL. A negative result does not preclude SARS-Cov-2 infection and should not be used as the sole basis for treatment or other patient management decisions. A negative result may occur with  improper specimen collection/handling, submission of specimen other than nasopharyngeal swab, presence of viral mutation(s) within the areas targeted by this assay, and inadequate number of viral copies(<138 copies/mL). A negative result must be combined with clinical observations, patient history, and epidemiological information. The expected result is Negative.  Fact Sheet for Patients:  EntrepreneurPulse.com.au  Fact Sheet for Healthcare Providers:  IncredibleEmployment.be  This test is no t yet approved or cleared by the [place] FDA and  has been authorized for detection and/or diagnosis of SARS-CoV-2 by FDA under an Emergency Use Authorization (EUA). This EUA will remain  in effect (meaning this test can be used) for the duration of the COVID-19 declaration under Section 564(b)(1) of the Act, 21 U.S.C.section 360bbb-3(b)(1),  unless the authorization is terminated  or revoked sooner.       Influenza A by PCR NEGATIVE NEGATIVE Final   Influenza B by PCR NEGATIVE NEGATIVE Final    Comment: (NOTE) The Xpert Xpress SARS-CoV-2/FLU/RSV plus assay is intended as an aid in the diagnosis of influenza from Nasopharyngeal swab specimens and should not be used as a sole basis for treatment. Nasal washings and aspirates are unacceptable for Xpert Xpress SARS-CoV-2/FLU/RSV testing.  Fact Sheet for Patients: EntrepreneurPulse.com.au  Fact Sheet for Healthcare Providers: IncredibleEmployment.be  This test is not yet approved or cleared by the [place] FDA and has been authorized for detection and/or diagnosis of SARS-CoV-2 by FDA under an Emergency Use Authorization (EUA). This EUA will remain in effect (meaning this test can be used) for the duration  of the COVID-19 declaration under Section 564(b)(1) of the Act, 21 U.S.C. section 360bbb-3(b)(1), unless the authorization is terminated or revoked.  Performed at [facility], [address] [place], [place] [address]     Radiology Studies: US Venous Img Lower Unilateral Right  Result Date: 06/16/2022 CLINICAL DATA:  Right lower extremity edema. EXAM: RIGHT LOWER EXTREMITY VENOUS DOPPLER ULTRASOUND TECHNIQUE: Gray-scale sonography with graded compression, as well as color Doppler and duplex ultrasound were performed to evaluate the lower extremity deep venous systems from the level of the common femoral vein and including the common femoral, femoral, profunda femoral, popliteal and calf veins including the posterior tibial, peroneal and gastrocnemius veins when visible. The superficial great saphenous vein was also interrogated. Spectral Doppler was utilized to evaluate flow at rest and with distal augmentation maneuvers in the common femoral, femoral and popliteal veins. COMPARISON:  None Available. FINDINGS: Contralateral Common  Femoral Vein: Respiratory phasicity is normal and symmetric with the symptomatic side. No evidence of thrombus. Normal compressibility. Common Femoral Vein: No evidence of thrombus. Normal compressibility, respiratory phasicity and response to augmentation. Saphenofemoral Junction: No evidence of thrombus. Normal compressibility and flow on color Doppler imaging. Profunda Femoral Vein: No evidence of thrombus. Normal compressibility and flow on color Doppler imaging. Femoral Vein: No evidence of thrombus. Normal compressibility, respiratory phasicity and response to augmentation. Popliteal Vein: No evidence of thrombus. Normal compressibility, respiratory phasicity and response to augmentation. Calf Veins: No evidence of thrombus. Normal compressibility and flow on color Doppler imaging. Superficial Great Saphenous Vein: No evidence of thrombus. Normal compressibility. Venous Reflux:  None. Other Findings: No evidence of superficial thrombophlebitis or abnormal fluid collection. IMPRESSION: No evidence of right lower extremity deep venous thrombosis. Electronically Signed   By: Aletta Edouard M.D.   On: 06/16/2022 15:04   DG Chest Port 1 View  Result Date: 06/16/2022 CLINICAL DATA:  Leg edema EXAM: PORTABLE CHEST 1 VIEW COMPARISON:  Chest 12/23/2021 FINDINGS: Prior median sternotomy and CABG. Cardiac enlargement. Negative for heart failure or edema. Small left pleural effusion with improvement. No significant right effusion Mild bibasilar atelectasis. IMPRESSION: 1. Cardiac enlargement without heart failure. 2. Small left pleural effusion with improvement. Electronically Signed   By: Franchot Gallo M.D.   On: 06/16/2022 14:37      Barbara Ahart T. [place]  If 7PM-7AM, please contact night-coverage [email or web address] 06/17/2022, 11:32 AM

## 2022-06-17 NOTE — Progress Notes (Signed)
  Echocardiogram 2D Echocardiogram has been performed.  Franklin Gonzalez 06/17/2022, 12:03 PM

## 2022-06-18 DIAGNOSIS — I10 Essential (primary) hypertension: Secondary | ICD-10-CM | POA: Diagnosis not present

## 2022-06-18 DIAGNOSIS — I5041 Acute combined systolic (congestive) and diastolic (congestive) heart failure: Secondary | ICD-10-CM | POA: Diagnosis not present

## 2022-06-18 DIAGNOSIS — E119 Type 2 diabetes mellitus without complications: Secondary | ICD-10-CM | POA: Diagnosis not present

## 2022-06-18 DIAGNOSIS — R9431 Abnormal electrocardiogram [ECG] [EKG]: Secondary | ICD-10-CM | POA: Diagnosis not present

## 2022-06-18 DIAGNOSIS — I5043 Acute on chronic combined systolic (congestive) and diastolic (congestive) heart failure: Secondary | ICD-10-CM | POA: Diagnosis not present

## 2022-06-18 DIAGNOSIS — I071 Rheumatic tricuspid insufficiency: Secondary | ICD-10-CM

## 2022-06-18 DIAGNOSIS — I5082 Biventricular heart failure: Secondary | ICD-10-CM

## 2022-06-18 LAB — RENAL FUNCTION PANEL
Albumin: 3.1 g/dL — ABNORMAL LOW (ref 3.5–5.0)
Anion gap: 8 (ref 5–15)
BUN: 36 mg/dL — ABNORMAL HIGH (ref 8–23)
CO2: 25 mmol/L (ref 22–32)
Calcium: 8.4 mg/dL — ABNORMAL LOW (ref 8.9–10.3)
Chloride: 107 mmol/L (ref 98–111)
Creatinine, Ser: 1.36 mg/dL — ABNORMAL HIGH (ref 0.61–1.24)
GFR, Estimated: 47 mL/min — ABNORMAL LOW (ref >60)
Glucose, Bld: 126 mg/dL — ABNORMAL HIGH (ref 70–99)
Phosphorus: 3.4 mg/dL (ref 2.5–4.6)
Potassium: 3.5 mmol/L (ref 3.5–5.1)
Sodium: 140 mmol/L (ref 135–145)

## 2022-06-18 LAB — CBC
HCT: 36.2 % — ABNORMAL LOW (ref 39.0–52.0)
Hemoglobin: 12 g/dL — ABNORMAL LOW (ref 13.0–17.0)
MCH: 31.3 pg (ref 26.0–34.0)
MCHC: 33.1 g/dL (ref 30.0–36.0)
MCV: 94.5 fL (ref 80.0–100.0)
Platelets: 122 10*3/uL — ABNORMAL LOW (ref 150–400)
RBC: 3.83 MIL/uL — ABNORMAL LOW (ref 4.22–5.81)
RDW: 14.9 % (ref 11.5–15.5)
WBC: 6.3 10*3/uL (ref 4.0–10.5)
nRBC: 0 % (ref 0.0–0.2)

## 2022-06-18 LAB — HEPATIC FUNCTION PANEL
ALT: 30 U/L (ref 0–44)
AST: 18 U/L (ref 15–41)
Albumin: 3 g/dL — ABNORMAL LOW (ref 3.5–5.0)
Alkaline Phosphatase: 69 U/L (ref 38–126)
Bilirubin, Direct: 0.2 mg/dL (ref 0.0–0.2)
Indirect Bilirubin: 0.7 mg/dL (ref 0.3–0.9)
Total Bilirubin: 0.9 mg/dL (ref 0.3–1.2)
Total Protein: 5.1 g/dL — ABNORMAL LOW (ref 6.5–8.1)

## 2022-06-18 LAB — GLUCOSE, CAPILLARY
Glucose-Capillary: 141 mg/dL — ABNORMAL HIGH (ref 70–99)
Glucose-Capillary: 166 mg/dL — ABNORMAL HIGH (ref 70–99)
Glucose-Capillary: 190 mg/dL — ABNORMAL HIGH (ref 70–99)
Glucose-Capillary: 154 mg/dL — ABNORMAL HIGH (ref 70–99)

## 2022-06-18 LAB — MAGNESIUM: Magnesium: 2.1 mg/dL (ref 1.7–2.4)

## 2022-06-18 LAB — BRAIN NATRIURETIC PEPTIDE: B Natriuretic Peptide: 3127 pg/mL — ABNORMAL HIGH (ref 0.0–100.0)

## 2022-06-18 MED ORDER — AMMONIUM LACTATE 12 % EX LOTN
1.0000 | TOPICAL_LOTION | Freq: Two times a day (BID) | CUTANEOUS | Status: DC
  Administered 2022-06-18 – 2022-06-20 (×4): 1 via TOPICAL
  Filled 2022-06-18: qty 400

## 2022-06-18 MED ORDER — SPIRONOLACTONE 12.5 MG HALF TABLET
12.5000 mg | ORAL_TABLET | Freq: Every day | ORAL | Status: DC
  Administered 2022-06-18 – 2022-06-20 (×3): 12.5 mg via ORAL
  Filled 2022-06-18 (×3): qty 1

## 2022-06-18 MED ORDER — ENOXAPARIN SODIUM 40 MG/0.4ML IJ SOSY
40.0000 mg | PREFILLED_SYRINGE | INTRAMUSCULAR | Status: DC
  Administered 2022-06-18: 40 mg via SUBCUTANEOUS
  Filled 2022-06-18: qty 0.4

## 2022-06-18 MED ORDER — POTASSIUM CHLORIDE CRYS ER 20 MEQ PO TBCR
40.0000 meq | EXTENDED_RELEASE_TABLET | Freq: Once | ORAL | Status: AC
  Administered 2022-06-18: 40 meq via ORAL
  Filled 2022-06-18: qty 2

## 2022-06-18 NOTE — Care Management Important Message (Signed)
Important Message  Patient Details  Name: Franklin Gonzalez MRN: [id number] Date of Birth: May 10, 1922   Medicare Important Message Given:  Yes     Tommy Medal 06/18/2022, 2:59 PM

## 2022-06-18 NOTE — Plan of Care (Signed)
  Problem: Acute Rehab PT Goals(only PT should resolve) Goal: Patient Will Transfer Sit To/From Stand Outcome: Progressing Flowsheets (Taken 06/18/2022 1439) Patient will transfer sit to/from stand:  with min guard assist  with minimal assist Goal: Pt Will Transfer Bed To Chair/Chair To Bed Outcome: Progressing Flowsheets (Taken 06/18/2022 1439) Pt will Transfer Bed to Chair/Chair to Bed:  min guard assist  with min assist Goal: Pt Will Ambulate Outcome: Progressing Flowsheets (Taken 06/18/2022 1439) Pt will Ambulate:  100 feet  with min guard assist  with least restrictive assistive device Goal: Pt/caregiver will Perform Home Exercise Program Outcome: Progressing Flowsheets (Taken 06/18/2022 1439) Pt/caregiver will Perform Home Exercise Program:  For increased strengthening  For improved balance  With Supervision, verbal cues required/provided  2:40 PM, 06/18/22 Mearl Latin PT, DPT Physical Therapist at [facility]

## 2022-06-18 NOTE — Progress Notes (Signed)
Progress Note  Patient Name: Donnivan Villena Date of Encounter: 06/18/2022  Primary Cardiologist: Kirk Ruths, MD   Subjective   Overnight has diuresed well. Patient notes that he feels. At baseline can do basic ADLs and walk around the house. Has not done this yet today.  Inpatient Medications    Scheduled Meds:  amiodarone  100 mg Oral Daily   aspirin EC  81 mg Oral Daily   atorvastatin  10 mg Oral QHS   dorzolamide-timolol  1 drop Both Eyes BID   doxazosin  2 mg Oral QHS   enoxaparin (LOVENOX) injection  30 mg Subcutaneous Q24H   furosemide  40 mg Intravenous Q12H   insulin aspart  0-5 Units Subcutaneous QHS   insulin aspart  0-9 Units Subcutaneous TID WC   latanoprost  1 drop Both Eyes QPM   levothyroxine  88 mcg Oral Q0600   losartan  50 mg Oral Daily   nystatin   Topical TID   potassium chloride  40 mEq Oral Once   Continuous Infusions:  PRN Meds: acetaminophen **OR** acetaminophen, polyethylene glycol, senna-docusate   Vital Signs    Vitals:   06/17/22 1320 06/17/22 2103 06/18/22 0341 06/18/22 0500  BP: 135/71 (!) 117/59 116/72   Pulse: 88 66 66   Resp:  20 20   Temp:  97.8 F (36.6 C) 98.2 F (36.8 C)   TempSrc:      SpO2: 93% 97% 95%   Weight:    72.4 kg  Height:        Intake/Output Summary (Last 24 hours) at 06/18/2022 0845 Last data filed at 06/18/2022 0500 Gross per 24 hour  Intake 480 ml  Output 4100 ml  Net -3620 ml   Filed Weights   06/16/22 1517 06/17/22 0353 06/18/22 0500  Weight: 68.9 kg 72.4 kg 72.4 kg    Telemetry    Sinus bradycardia 1st HB with PVcs - Personally Reviewed  Physical Exam   Gen: No distress, elderly male Cardiac: No Rubs or Gallops, soft systolic murmur, +2 radial pulses Respiratory: Clear to auscultation bilaterally, decreased effort, normal  respiratory rate GI: Soft, nontender, non-distended  MS: +1 edema;  moves all extremities Integument: Skin feels warm Neuro:  At time of evaluation, alert  and oriented to person/place/time/situation  Psych: Normal affect, patient feels [facility]   Labs    Chemistry Recent Labs  Lab 06/16/22 1411 06/17/22 0504 06/18/22 0323  NA 139 141 140  K 4.2 3.8 3.5  CL 110 111 107  CO2 22 22 25   GLUCOSE 187* 143* 126*  BUN 43* 41* 36*  CREATININE 1.62* 1.49* 1.36*  CALCIUM 8.5* 8.6* 8.4*  PROT  --   --  5.1*  ALBUMIN  --   --  3.0*  3.1*  AST  --   --  18  ALT  --   --  30  ALKPHOS  --   --  69  BILITOT  --   --  0.9  GFRNONAA 38* 42* 47*  ANIONGAP 7 8 8      Hematology Recent Labs  Lab 06/16/22 1411 06/18/22 0323  WBC 5.3 6.3  RBC 3.80* 3.83*  HGB 11.9* 12.0*  HCT 36.7* 36.2*  MCV 96.6 94.5  MCH 31.3 31.3  MCHC 32.4 33.1  RDW 15.1 14.9  PLT 124* 122*    Cardiac EnzymesNo results for input(s): "TROPONINI" in the last 168 hours. No results for input(s): "TROPIPOC" in the last 168 hours.   BNP Recent  Labs  Lab 06/16/22 1411 06/18/22 0323  BNP 2,282.0* 3,127.0*     DDimer No results for input(s): "DDIMER" in the last 168 hours.   Radiology    ECHOCARDIOGRAM COMPLETE  Result Date: 06/17/2022    ECHOCARDIOGRAM REPORT   Patient Name:   Lavell IslamJAMES Schnoor Date of Exam: 06/17/2022 Medical Rec #:  [id number]       Height:       71.0 in Accession #:    [id number]      Weight:       159.6 lb Date of Birth:  [birth year]       BSA:          1.916 m Patient Age:    88 years        BP:           117/74 mmHg Patient Gender: M               HR:           57 bpm. [facility] Procedure: 2D Echo Indications:    congestive heart failure  History:        Patient has prior history of Echocardiogram examinations, most                 recent 06/27/2020. CHF, CAD; Risk Factors:Hypertension,                 Dyslipidemia and Diabetes.  Sonographer:    Delcie RochLauren Pennington RDCS Referring Phys: [id number] Heloise BeechamEJIROGHENE E EMOKPAE IMPRESSIONS  1. Left ventricular ejection fraction, by estimation, is 20 to 25%. The left ventricle has severely decreased  function. The left ventricle demonstrates global hypokinesis. The left ventricular internal cavity size was mildly dilated. Left ventricular diastolic parameters are indeterminate.  2. Right ventricular systolic function is severely reduced. The right ventricular size is normal. There is moderately elevated pulmonary artery systolic pressure. The estimated right ventricular systolic pressure is 47.0 mmHg.  3. Left atrial size was severely dilated.  4. Right atrial size was severely dilated.  5. The mitral valve is normal in structure. Trivial mitral valve regurgitation. No evidence of mitral stenosis.  6. Tricuspid valve regurgitation is moderate to severe.  7. The aortic valve was not well visualized. Aortic valve regurgitation is trivial. Aortic valve sclerosis/calcification is present, without any evidence of aortic stenosis.  8. The inferior vena cava is dilated in size with <50% respiratory variability, suggesting right atrial pressure of 15 mmHg. FINDINGS  Left Ventricle: Left ventricular ejection fraction, by estimation, is 20 to 25%. The left ventricle has severely decreased function. The left ventricle demonstrates global hypokinesis. The left ventricular internal cavity size was mildly dilated. Suboptimal image quality limits for assessment of left ventricular hypertrophy. Left ventricular diastolic parameters are indeterminate. Right Ventricle: The right ventricular size is normal. No increase in right ventricular wall thickness. Right ventricular systolic function is severely reduced. There is moderately elevated pulmonary artery systolic pressure. The tricuspid regurgitant velocity is 2.83 m/s, and with an assumed right atrial pressure of 15 mmHg, the estimated right ventricular systolic pressure is 47.0 mmHg. Left Atrium: Left atrial size was severely dilated. Right Atrium: Right atrial size was severely dilated. Pericardium: Trivial pericardial effusion is present. Mitral Valve: The mitral valve is  normal in structure. Trivial mitral valve regurgitation. No evidence of mitral valve stenosis. Tricuspid Valve: The tricuspid valve is normal in structure. Tricuspid valve regurgitation is moderate to severe. Aortic Valve: The aortic valve was not well visualized.  Aortic valve regurgitation is trivial. Aortic valve sclerosis/calcification is present, without any evidence of aortic stenosis. Pulmonic Valve: The pulmonic valve was not well visualized. Pulmonic valve regurgitation is not visualized. Aorta: The aortic root is normal in size and structure. Venous: The inferior vena cava is dilated in size with less than 50% respiratory variability, suggesting right atrial pressure of 15 mmHg. IAS/Shunts: The interatrial septum was not well visualized.  LEFT VENTRICLE PLAX 2D LVIDd:         6.10 cm LVIDs:         5.30 cm LV PW:         0.90 cm LV IVS:        0.90 cm LVOT diam:     2.20 cm LV SV:         26 LV SV Index:   14 LVOT Area:     3.80 cm  LV Volumes (MOD) LV vol d, MOD A4C: 147.0 ml LV vol s, MOD A4C: 122.0 ml LV SV MOD A4C:     147.0 ml RIGHT VENTRICLE            IVC RV S prime:     3.92 cm/s  IVC diam: 2.60 cm LEFT ATRIUM              Index        RIGHT ATRIUM           Index LA diam:        4.30 cm  2.24 cm/m   RA Area:     25.60 cm LA Vol (A2C):   133.0 ml 69.42 ml/m  RA Volume:   91.10 ml  47.55 ml/m LA Vol (A4C):   93.1 ml  48.60 ml/m LA Biplane Vol: 113.0 ml 58.98 ml/m  AORTIC VALVE LVOT Vmax:   41.60 cm/s LVOT Vmean:  25.700 cm/s LVOT VTI:    0.068 m  AORTA Ao Root diam: 3.50 cm MITRAL VALVE               TRICUSPID VALVE MV Area (PHT): 4.24 cm    TR Peak grad:   32.0 mmHg MV Decel Time: 179 msec    TR Vmax:        283.00 cm/s MV E velocity: 61.30 cm/s MV A velocity: 36.40 cm/s  SHUNTS MV E/A ratio:  1.68        Systemic VTI:  0.07 m                            Systemic Diam: 2.20 cm Oswaldo Milian MD Electronically signed by Oswaldo Milian MD Signature Date/Time: 06/17/2022/4:58:14 PM     Final    US Venous Img Lower Unilateral Right  Result Date: 06/16/2022 CLINICAL DATA:  Right lower extremity edema. EXAM: RIGHT LOWER EXTREMITY VENOUS DOPPLER ULTRASOUND TECHNIQUE: Gray-scale sonography with graded compression, as well as color Doppler and duplex ultrasound were performed to evaluate the lower extremity deep venous systems from the level of the common femoral vein and including the common femoral, femoral, profunda femoral, popliteal and calf veins including the posterior tibial, peroneal and gastrocnemius veins when visible. The superficial great saphenous vein was also interrogated. Spectral Doppler was utilized to evaluate flow at rest and with distal augmentation maneuvers in the common femoral, femoral and popliteal veins. COMPARISON:  None Available. FINDINGS: Contralateral Common Femoral Vein: Respiratory phasicity is normal and symmetric with the symptomatic side. No evidence of thrombus. Normal compressibility.  Common Femoral Vein: No evidence of thrombus. Normal compressibility, respiratory phasicity and response to augmentation. Saphenofemoral Junction: No evidence of thrombus. Normal compressibility and flow on color Doppler imaging. Profunda Femoral Vein: No evidence of thrombus. Normal compressibility and flow on color Doppler imaging. Femoral Vein: No evidence of thrombus. Normal compressibility, respiratory phasicity and response to augmentation. Popliteal Vein: No evidence of thrombus. Normal compressibility, respiratory phasicity and response to augmentation. Calf Veins: No evidence of thrombus. Normal compressibility and flow on color Doppler imaging. Superficial Great Saphenous Vein: No evidence of thrombus. Normal compressibility. Venous Reflux:  None. Other Findings: No evidence of superficial thrombophlebitis or abnormal fluid collection. IMPRESSION: No evidence of right lower extremity deep venous thrombosis. Electronically Signed   By: Aletta Edouard M.D.   On:  06/16/2022 15:04   DG Chest Port 1 View  Result Date: 06/16/2022 CLINICAL DATA:  Leg edema EXAM: PORTABLE CHEST 1 VIEW COMPARISON:  Chest 12/23/2021 FINDINGS: Prior median sternotomy and CABG. Cardiac enlargement. Negative for heart failure or edema. Small left pleural effusion with improvement. No significant right effusion Mild bibasilar atelectasis. IMPRESSION: 1. Cardiac enlargement without heart failure. 2. Small left pleural effusion with improvement. Electronically Signed   By: Franchot Gallo M.D.   On: 06/16/2022 14:37     Patient Profile     88 y.o. male with stage D HF  Assessment & Plan    Acute on Chronic combined HR - NYHA III, Stage D, he is still volume overloaded, uncleat etiology - BB intolerant, ARNI intolerant; continue ARB - no SGLT2i planned due to skin break down - will add low dose MRA 12.5 mg PO daily - home diuretic will increase to 20 mg PO daily - AKI has improved with diuresis  SVT QTC prolongation with LAFB and RBBB - repeat EKG pending, unclear atrial morpolohy with regular rhythm in ED Scan - LFTs and TFTs Ok, no TDP ectopy, continue low dose amiodarone  CAD - He is s/p CABG in 1993 with PCI of SVG-RCA and SVG-OM1 in 2014. - continue statin - continue asa   For questions or updates, please contact [facility] Please consult [email or web address] for contact info under Cardiology/STEMI.      Rudean Haskell, MD [place], [address] [place], [place] [address] [phone]  8:45 AM

## 2022-06-18 NOTE — Progress Notes (Addendum)
PROGRESS NOTE  Franklin Gonzalez [id number] DOB: 1922/04/29   PCP: [place], MD  Patient is from: Home.  Lives alone.  Has aides   DOA: 06/16/2022 LOS: 2  Chief complaints Chief Complaint  Patient presents with   Leg Swelling     Brief Narrative / Interim history: 88 year old M with PMH of combined CHF, CAD/CABG 1993, CKD-3B, PAD, A-fib not on AC, hypothyroidism and DM-2 brought to ED by EMS due to bilateral upper and lower extremity swelling/edema, and admitted for acute on chronic combined CHF.  Patient is on torsemide 20 mg every other day.  Reports good compliance with diuretics but admits to fluid indiscretion.  Had no respiratory symptoms.  RLE venous Doppler negative for DVT.  BNP elevated to 2282.  CXR with small left pleural effusion.  EKG shows atrial fibrillation at 58/min and QTc of 527.  Patient was started on IV Lasix and admitted.  Cardiology consulted.  TTE with LVEF of 20 to 25%, GH, indeterminate DD, severely reduced RV SF, severe LAE and RAE and moderate to severe TVR.  Remains on IV Lasix.  Subjective: Seen and examined earlier this morning.  No major events overnight of this morning.  No complaints.  He denies shortness of breath, chest pain or GI symptoms.  Daughter-in-law at bedside.  Objective: Vitals:   06/17/22 2103 06/18/22 0341 06/18/22 0500 06/18/22 0800  BP: (!) 117/59 116/72  113/73  Pulse: 66 66  66  Resp: 20 20    Temp: 97.8 F (36.6 C) 98.2 F (36.8 C)  98.6 F (37 C)  TempSrc:    Oral  SpO2: 97% 95%  96%  Weight:   72.4 kg   Height:        Examination:   GENERAL: No apparent distress.   HEENT: MMM.  Vision and hearing grossly intact.  NECK: Supple.  No apparent JVD.  RESP:  No IWOB.  Fair aeration bilaterally. CVS:  RRR. Heart sounds normal.  ABD/GI/GU: BS+. Abd soft, NTND.  Indwelling Foley. MSK/EXT:  Moves extremities. No apparent deformity.  Trace BLE edema, right> left. SKIN: no apparent skin lesion or wound NEURO:  Awake and alert. Oriented appropriately.  No apparent focal neuro deficit. PSYCH: Calm. Normal affect.   Procedures:  None  Microbiology summarized: [id number] and influenza PCR nonreactive.  Assessment and plan: Principal Problem:   Acute on chronic combined systolic and diastolic CHF (congestive heart failure) (HCC) Active Problems:   Acute respiratory failure with hypoxia (HCC)   SVT (supraventricular tachycardia) (HCC)   HTN (hypertension)   CAD (coronary artery disease)   Chronic renal insufficiency, stage III (moderate) (HCC)   Benign essential hypertension   Type 2 diabetes mellitus without complications (HCC)   Acute on chronic congestive heart failure (HCC)   Prolonged QT interval  Acute on chronic combined CHF/biventricular failure: present with all extremity edema.  No chest pain or dyspnea.  CXR with trace bilateral pleural effusion.  BNP elevated to 2282 but no recent values for comparison.  TTE as above.  Started on IV Lasix.  Net -4 L.  Creatinine improving. -Cardiology following. -Continue IV Lasix 40 mg twice daily -Insert Foley catheter to monitor urine output given urinary retention. -Monitor renal functions, electrolytes -Counseled on fluid restriction moving forward -Palliative medicine consulted  Acute respiratory failure with hypoxia: Desaturated 89% on room air with mild exertion.  Likely due to the above.  CXR showed trace pleural effusion.  Now on room air. -Manage CHF as above -Encourage  incentive spirometry  Tricuspid valve regurgitation: TTE shows moderate to severe TVR.  Not a candidate for surgical intervention. -Cardiology following  Persistent A-fib: Rate controlled.  On amiodarone.  Not on anticoagulation but low-dose aspirin. -Continue home amiodarone and aspirin  History of CAD/CABG 1993/PAD: No chest pain.  Mildly elevated troponin likely demand ischemia.  TTE as above. -Continue home meds.  Acute urinary retention: Suspect BPH.  Foley  inserted and had 600 cc UOP right away on 10/12  -Continue home Cardura -Voiding trial prior to discharge  CKD-3B: Stable. Recent Labs    05/12/22 0900 06/16/22 1411 06/17/22 0504 06/18/22 0323  BUN 49* 43* 41* 36*  CREATININE 1.69* 1.62* 1.49* 1.36*  -Continue monitoring  Essential hypertension: Normotensive for most part. -Continue losartan, Cardura and Lasix  Prolonged QT: QTc 527.  Exaggerated by wide QRS. -Minimize QT prolonging drugs -Continue telemetry monitoring -Optimize K and Mg  Uncontrolled NIDDM-2 with hyperglycemia: A1c 8.0% in 06/2020. Recent Labs  Lab 06/17/22 1155 06/17/22 1655 06/17/22 2105 06/18/22 0709 06/18/22 1101  GLUCAP 189* 137* 138* 141* 166*  -Continue current insulin regimen and started  Thrombocytopenia: Stable. Recent Labs  Lab 06/16/22 1411 06/18/22 0323  PLT 124* 122*  -Continue monitoring   Physical deconditioning: Lives alone. -PT/OT eval  Goal of care counseling: Confirmed DNR/DNI with patient's and patient's son at bedside.  Given age and comorbidities, I think he is appropriate at least for home hospice. -Palliative medicine consulted   Body mass index is 22.26 kg/m.           DVT prophylaxis:  enoxaparin (LOVENOX) injection 40 mg Start: 06/18/22 2100  Code Status: DNR/DNI Family Communication: Updated daughter-in-law at bedside. Level of care: Telemetry Status is: Inpatient Remains inpatient appropriate because: Acute combined CHF   Final disposition: TBD Consultants:  Cardiology Palliative medicine  Sch Meds:  Scheduled Meds:  amiodarone  100 mg Oral Daily   aspirin EC  81 mg Oral Daily   atorvastatin  10 mg Oral QHS   dorzolamide-timolol  1 drop Both Eyes BID   doxazosin  2 mg Oral QHS   enoxaparin (LOVENOX) injection  40 mg Subcutaneous Q24H   furosemide  40 mg Intravenous Q12H   insulin aspart  0-5 Units Subcutaneous QHS   insulin aspart  0-9 Units Subcutaneous TID WC   latanoprost  1 drop  Both Eyes QPM   levothyroxine  88 mcg Oral Q0600   losartan  50 mg Oral Daily   nystatin   Topical TID   spironolactone  12.5 mg Oral Daily   Continuous Infusions: PRN Meds:.acetaminophen **OR** acetaminophen, polyethylene glycol, senna-docusate  Antimicrobials: Anti-infectives (From admission, onward)    None        I have personally reviewed the following labs and images: CBC: Recent Labs  Lab 06/16/22 1411 06/18/22 0323  WBC 5.3 6.3  NEUTROABS 4.0  --   HGB 11.9* 12.0*  HCT 36.7* 36.2*  MCV 96.6 94.5  PLT 124* 122*   BMP &GFR Recent Labs  Lab 06/16/22 1411 06/16/22 1549 06/17/22 0504 06/18/22 0323  NA 139  --  141 140  K 4.2  --  3.8 3.5  CL 110  --  111 107  CO2 22  --  22 25  GLUCOSE 187*  --  143* 126*  BUN 43*  --  41* 36*  CREATININE 1.62*  --  1.49* 1.36*  CALCIUM 8.5*  --  8.6* 8.4*  MG  --  2.4  --  2.1  PHOS  --   --   --  3.4   Estimated Creatinine Clearance: 31.1 mL/min (A) (by C-G formula based on SCr of 1.36 mg/dL (H)). Liver & Pancreas: Recent Labs  Lab 06/18/22 0323  AST 18  ALT 30  ALKPHOS 69  BILITOT 0.9  PROT 5.1*  ALBUMIN 3.0*  3.1*   No results for input(s): "LIPASE", "AMYLASE" in the last 168 hours. No results for input(s): "AMMONIA" in the last 168 hours. Diabetic: Recent Labs    06/17/22 0504  HGBA1C 8.4*   Recent Labs  Lab 06/17/22 1155 06/17/22 1655 06/17/22 2105 06/18/22 0709 06/18/22 1101  GLUCAP 189* 137* 138* 141* 166*   Cardiac Enzymes: No results for input(s): "CKTOTAL", "CKMB", "CKMBINDEX", "TROPONINI" in the last 168 hours. No results for input(s): "PROBNP" in the last 8760 hours. Coagulation Profile: No results for input(s): "INR", "PROTIME" in the last 168 hours. Thyroid Function Tests: Recent Labs    06/16/22 1411  TSH 1.803   Lipid Profile: No results for input(s): "CHOL", "HDL", "LDLCALC", "TRIG", "CHOLHDL", "LDLDIRECT" in the last 72 hours. Anemia Panel: No results for input(s):  "VITAMINB12", "FOLATE", "FERRITIN", "TIBC", "IRON", "RETICCTPCT" in the last 72 hours. Urine analysis:    Component Value Date/Time   COLORURINE STRAW (A) 06/16/2022 [place] 06/16/2022 1548   LABSPEC 1.008 06/16/2022 1548   PHURINE 5.0 06/16/2022 [place] 06/16/2022 1548   HGBUR NEGATIVE 06/16/2022 [place] 06/16/2022 [place] 06/16/2022 1548   PROTEINUR NEGATIVE 06/16/2022 1548   UROBILINOGEN 1.0 10/03/2012 1151   NITRITE NEGATIVE 06/16/2022 1548   LEUKOCYTESUR SMALL (A) 06/16/2022 1548   Sepsis Labs: Invalid input(s): "PROCALCITONIN", "LACTICIDVEN"  Microbiology: Recent Results (from the past 240 hour(s))  Resp Panel by RT-PCR (Flu A&B, Covid) Anterior Nasal Swab     Status: None   Collection Time: 06/16/22  1:57 PM   Specimen: Anterior Nasal Swab  Result Value Ref Range Status   SARS Coronavirus 2 by RT PCR NEGATIVE NEGATIVE Final    Comment: (NOTE) SARS-CoV-2 target nucleic acids are NOT DETECTED.  The SARS-CoV-2 RNA is generally detectable in upper respiratory specimens during the acute phase of infection. The lowest concentration of SARS-CoV-2 viral copies this assay can detect is 138 copies/mL. A negative result does not preclude SARS-Cov-2 infection and should not be used as the sole basis for treatment or other patient management decisions. A negative result may occur with  improper specimen collection/handling, submission of specimen other than nasopharyngeal swab, presence of viral mutation(s) within the areas targeted by this assay, and inadequate number of viral copies(<138 copies/mL). A negative result must be combined with clinical observations, patient history, and epidemiological information. The expected result is Negative.  Fact Sheet for Patients:  EntrepreneurPulse.com.au  Fact Sheet for Healthcare Providers:  IncredibleEmployment.be  This test  is no t yet approved or cleared by the [place] FDA and  has been authorized for detection and/or diagnosis of SARS-CoV-2 by FDA under an Emergency Use Authorization (EUA). This EUA will remain  in effect (meaning this test can be used) for the duration of the COVID-19 declaration under Section 564(b)(1) of the Act, 21 U.S.C.section 360bbb-3(b)(1), unless the authorization is terminated  or revoked sooner.       Influenza A by PCR NEGATIVE NEGATIVE Final   Influenza B by PCR NEGATIVE NEGATIVE Final    Comment: (NOTE) The Xpert Xpress SARS-CoV-2/FLU/RSV plus assay is intended as an aid in  the diagnosis of influenza from Nasopharyngeal swab specimens and should not be used as a sole basis for treatment. Nasal washings and aspirates are unacceptable for Xpert Xpress SARS-CoV-2/FLU/RSV testing.  Fact Sheet for Patients: EntrepreneurPulse.com.au  Fact Sheet for Healthcare Providers: IncredibleEmployment.be  This test is not yet approved or cleared by the [place] FDA and has been authorized for detection and/or diagnosis of SARS-CoV-2 by FDA under an Emergency Use Authorization (EUA). This EUA will remain in effect (meaning this test can be used) for the duration of the COVID-19 declaration under Section 564(b)(1) of the Act, 21 U.S.C. section 360bbb-3(b)(1), unless the authorization is terminated or revoked.  Performed at [facility], [address] [place], [place] [address]     Radiology Studies: No results found.    Jayci Ellefson T. Dunmor  If 7PM-7AM, please contact night-coverage [email or web address] 06/18/2022, 1:44 PM

## 2022-06-18 NOTE — Evaluation (Signed)
Physical Therapy Evaluation Patient Details Name: Franklin Gonzalez MRN: [id number] DOB: 03/09/1922 Today's Date: 06/18/2022  History of Present Illness  Franklin Gonzalez is a 88 y.o. male with medical history significant for diastolic and systolic CHF, diabetes mellitus, CKD 3, coronary artery disease, status post CABG 1993.  Patient was brought to the ED via EMS with reports of bilateral lower extremity and upper extremity swelling.  Son present at bedside also said patient had swelling underneath his chin.  Patient is on torsemide 20 mg every other day and he reports compliance with this.  He denies difficulty breathing.  No cough.  No chest pain.  Patient lives alone, but has caregiver with him morning still 12, and  5 to 7 pm in the evening.  He ambulates with a walker   Clinical Impression  Patient limited for functional mobility as stated below secondary to BLE weakness, fatigue and impaired standing balance. Patient requiring min G to mod assist for mobility today with greatest assist required with transfer from sitting to standing due to weakness. Patient ambulates with labored and mildly unsteady cadence but without loss of balance with RW. Patient would likely be able to return home if family is able to provide more assistance for patient, otherwise would need to go to SNF rehab as he lives alone with some caregiver support during the day. Patient will benefit from continued physical therapy in hospital and recommended venue below to increase strength, balance, endurance for safe ADLs and gait.        Recommendations for follow up therapy are one component of a multi-disciplinary discharge planning process, led by the attending physician.  Recommendations may be updated based on patient status, additional functional criteria and insurance authorization.  Follow Up Recommendations Skilled nursing-short term rehab (<3 hours/day) Can patient physically be transported by private vehicle: Yes     Assistance Recommended at Discharge Frequent or constant Supervision/Assistance  Patient can return home with the following  A little help with walking and/or transfers;A little help with bathing/dressing/bathroom;Assistance with cooking/housework    Equipment Recommendations None recommended by PT  Recommendations for Other Services       Functional Status Assessment Patient has had a recent decline in their functional status and demonstrates the ability to make significant improvements in function in a reasonable and predictable amount of time.     Precautions / Restrictions Precautions Precautions: Fall Restrictions Weight Bearing Restrictions: No      Mobility  Bed Mobility Overal bed mobility: Needs Assistance Bed Mobility: Supine to Sit     Supine to sit: Min guard, HOB elevated     General bed mobility comments: slow, labored    Transfers Overall transfer level: Needs assistance Equipment used: Rolling walker (2 wheels) Transfers: Sit to/from Stand Sit to Stand: Mod assist           General transfer comment: labored, assist to power up to standing due to weakness    Ambulation/Gait Ambulation/Gait assistance: Min assist Gait Distance (Feet): 60 Feet Assistive device: Rolling walker (2 wheels) Gait Pattern/deviations: Trunk flexed, Step-through pattern, Decreased stride length       General Gait Details: labored cadence with mild unsteadiness  Stairs            Wheelchair Mobility    Modified Rankin (Stroke Patients Only)       Balance Overall balance assessment: Needs assistance Sitting-balance support: No upper extremity supported, Feet supported Sitting balance-Leahy Scale: Good Sitting balance - Comments: seated EOB   Standing  balance support: Bilateral upper extremity supported Standing balance-Leahy Scale: Fair Standing balance comment: with RW                             Pertinent Vitals/Pain Pain  Assessment Pain Assessment: No/denies pain    Home Living Family/patient expects to be discharged to:: Private residence Living Arrangements: Alone Available Help at Discharge: Family;Available PRN/intermittently;Personal care attendant (caregiver in the morning from about 8-noon and 3x/week in the evening; family assists PRN) Type of Home: House Home Access: Stairs to enter Entrance Stairs-Rails: Can reach both Entrance Stairs-Number of Steps: 4   Home Layout: Two level;Able to live on main level with bedroom/bathroom Home Equipment: Rolling Walker (2 wheels);Cane - single point;Shower seat;Grab bars - toilet;Grab bars - tub/shower;Toilet riser      Prior Function Prior Level of Function : Needs assist             Mobility Comments: household ambulation with upright walker ADLs Comments: assisted with bathing, cooking     Hand Dominance        Extremity/Trunk Assessment   Upper Extremity Assessment Upper Extremity Assessment: Generalized weakness    Lower Extremity Assessment Lower Extremity Assessment: Generalized weakness    Cervical / Trunk Assessment Cervical / Trunk Assessment: Kyphotic  Communication   Communication: No difficulties  Cognition Arousal/Alertness: Awake/alert Behavior During Therapy: WFL for tasks assessed/performed Overall Cognitive Status: Within Functional Limits for tasks assessed                                          General Comments      Exercises     Assessment/Plan    PT Assessment Patient needs continued PT services  PT Problem List Decreased strength;Decreased mobility;Decreased activity tolerance;Decreased balance       PT Treatment Interventions DME instruction;Therapeutic exercise;Gait training;Balance training;Neuromuscular re-education;Stair training;Functional mobility training;Therapeutic activities;Patient/family education    PT Goals (Current goals can be found in the Care Plan section)   Acute Rehab PT Goals Patient Stated Goal: return home PT Goal Formulation: With patient Time For Goal Achievement: 07/02/22 Potential to Achieve Goals: Good    Frequency Min 3X/week     Co-evaluation               AM-PAC PT "6 Clicks" Mobility  Outcome Measure Help needed turning from your back to your side while in a flat bed without using bedrails?: A Little Help needed moving from lying on your back to sitting on the side of a flat bed without using bedrails?: A Little Help needed moving to and from a bed to a chair (including a wheelchair)?: A Lot Help needed standing up from a chair using your arms (e.g., wheelchair or bedside chair)?: A Lot Help needed to walk in hospital room?: A Little Help needed climbing 3-5 steps with a railing? : A Lot 6 Click Score: 15    End of Session Equipment Utilized During Treatment: Gait belt Activity Tolerance: Patient tolerated treatment well Patient left: in bed;with family/visitor present;with call bell/phone within reach Nurse Communication: Mobility status PT Visit Diagnosis: Unsteadiness on feet (R26.81);Other abnormalities of gait and mobility (R26.89);Muscle weakness (generalized) (M62.81)    Time: 9735-3299 PT Time Calculation (min) (ACUTE ONLY): 34 min   Charges:   PT Evaluation $PT Eval Low Complexity: 1 Low PT Treatments $Therapeutic Activity: 23-37 mins  2:38 PM, 06/18/22 Wyman Songster PT, DPT Physical Therapist at [facility]

## 2022-06-19 DIAGNOSIS — R9431 Abnormal electrocardiogram [ECG] [EKG]: Secondary | ICD-10-CM | POA: Diagnosis not present

## 2022-06-19 DIAGNOSIS — E119 Type 2 diabetes mellitus without complications: Secondary | ICD-10-CM | POA: Diagnosis not present

## 2022-06-19 DIAGNOSIS — I5041 Acute combined systolic (congestive) and diastolic (congestive) heart failure: Secondary | ICD-10-CM | POA: Diagnosis not present

## 2022-06-19 DIAGNOSIS — I10 Essential (primary) hypertension: Secondary | ICD-10-CM | POA: Diagnosis not present

## 2022-06-19 LAB — CBC
HCT: 36.5 % — ABNORMAL LOW (ref 39.0–52.0)
Hemoglobin: 11.9 g/dL — ABNORMAL LOW (ref 13.0–17.0)
MCH: 31.2 pg (ref 26.0–34.0)
MCHC: 32.6 g/dL (ref 30.0–36.0)
MCV: 95.5 fL (ref 80.0–100.0)
Platelets: 125 10*3/uL — ABNORMAL LOW (ref 150–400)
RBC: 3.82 MIL/uL — ABNORMAL LOW (ref 4.22–5.81)
RDW: 14.6 % (ref 11.5–15.5)
WBC: 6.2 10*3/uL (ref 4.0–10.5)
nRBC: 0 % (ref 0.0–0.2)

## 2022-06-19 LAB — MAGNESIUM: Magnesium: 2.1 mg/dL (ref 1.7–2.4)

## 2022-06-19 LAB — RENAL FUNCTION PANEL
Albumin: 2.8 g/dL — ABNORMAL LOW (ref 3.5–5.0)
Anion gap: 7 (ref 5–15)
BUN: 35 mg/dL — ABNORMAL HIGH (ref 8–23)
CO2: 25 mmol/L (ref 22–32)
Calcium: 8.1 mg/dL — ABNORMAL LOW (ref 8.9–10.3)
Chloride: 105 mmol/L (ref 98–111)
Creatinine, Ser: 1.45 mg/dL — ABNORMAL HIGH (ref 0.61–1.24)
GFR, Estimated: 44 mL/min — ABNORMAL LOW (ref >60)
Glucose, Bld: 150 mg/dL — ABNORMAL HIGH (ref 70–99)
Phosphorus: 3.3 mg/dL (ref 2.5–4.6)
Potassium: 3.8 mmol/L (ref 3.5–5.1)
Sodium: 137 mmol/L (ref 135–145)

## 2022-06-19 LAB — GLUCOSE, CAPILLARY
Glucose-Capillary: 146 mg/dL — ABNORMAL HIGH (ref 70–99)
Glucose-Capillary: 237 mg/dL — ABNORMAL HIGH (ref 70–99)

## 2022-06-19 LAB — BRAIN NATRIURETIC PEPTIDE: B Natriuretic Peptide: 2024 pg/mL — ABNORMAL HIGH (ref 0.0–100.0)

## 2022-06-19 MED ORDER — FUROSEMIDE 10 MG/ML IJ SOLN
40.0000 mg | Freq: Every day | INTRAMUSCULAR | Status: DC
  Administered 2022-06-20: 40 mg via INTRAVENOUS
  Filled 2022-06-19: qty 4

## 2022-06-19 MED ORDER — BETHANECHOL CHLORIDE 25 MG PO TABS
25.0000 mg | ORAL_TABLET | Freq: Once | ORAL | Status: AC
  Administered 2022-06-19: 25 mg via ORAL
  Filled 2022-06-19: qty 1

## 2022-06-19 MED ORDER — BETHANECHOL CHLORIDE 10 MG PO TABS
10.0000 mg | ORAL_TABLET | Freq: Three times a day (TID) | ORAL | Status: DC
  Administered 2022-06-19 – 2022-06-20 (×2): 10 mg via ORAL
  Filled 2022-06-19 (×5): qty 1

## 2022-06-19 MED ORDER — ENOXAPARIN SODIUM 30 MG/0.3ML IJ SOSY
30.0000 mg | PREFILLED_SYRINGE | INTRAMUSCULAR | Status: DC
  Administered 2022-06-19: 30 mg via SUBCUTANEOUS
  Filled 2022-06-19: qty 0.3

## 2022-06-19 NOTE — Progress Notes (Addendum)
patients bladder scan is showing 269. Bladder scan was 201 at 1400. Patient has urinated a total of 60 mL since the first bladder scan today.  Patient states that he does not feel the need to urinate right now. MD Dexter notified. MD Cyndia Skeeters states to replace foley if bladder scan is >600.

## 2022-06-19 NOTE — Progress Notes (Signed)
Patient's urine is a dark brown color. MD Gonfa made aware.

## 2022-06-19 NOTE — TOC Initial Note (Signed)
Transition of Care [facility]) - Initial/Assessment Note    Patient Details  Name: Franklin Gonzalez MRN: [id number] Date of Birth: July 27, 1922  [facility]) CM/SW Contact:    Boneta Lucks, RN Phone Number: 06/19/2022, 7:42 AM  Clinical Narrative:      Patient admitted with acute on chronic combined systolic and diastolic CHF. Patient lives alone. PT is recommending SNF. CM spoke with his son, Iona Beard. Patient has PT/RN already coming to the home. He gets meals on wheels and has sitters in the morning. They want to resume home health. MD aware to order. DC planning for Sunday to have sitters and meals arrive.          Expected Discharge Plan: [place] Barriers to Discharge: Continued Medical Work up   Patient Goals and CMS Choice Patient states their goals for this hospitalization and ongoing recovery are:: to return home. CMS Medicare.gov Compare Post Acute Care list provided to:: Patient Represenative (must comment) Choice offered to / list presented to : Adult Children  Expected Discharge Plan and Services Expected Discharge Plan: [place] Acute Care Choice: [place] arrangements for the past 2 months: Single Family Home                     Prior Living Arrangements/Services Living arrangements for the past 2 months: Single Family Home Lives with:: Self Patient language and need for interpreter reviewed:: Yes Do you feel safe going back to the place where you live?: Yes      Need for Family Participation in Patient Care: Yes (Comment) Care giver support system in place?: Yes (comment) Current home services: Meals on wheels, Home PT, Home RN, Actuary, DME Criminal Activity/Legal Involvement Pertinent to Current Situation/Hospitalization: No - Comment as needed  Activities of Daily Living Home Assistive Devices/Equipment: Eyeglasses, Environmental consultant (specify type) (rolling walker with seat) ADL Screening (condition at  time of admission) Patient's cognitive ability adequate to safely complete daily activities?: Yes Is the patient deaf or have difficulty hearing?: Yes Does the patient have difficulty seeing, even when wearing glasses/contacts?: Yes Does the patient have difficulty concentrating, remembering, or making decisions?: No Patient able to express need for assistance with ADLs?: Yes Does the patient have difficulty dressing or bathing?: Yes Independently performs ADLs?: No Communication: Needs assistance Is this a change from baseline?: Pre-admission baseline Dressing (OT): Needs assistance Is this a change from baseline?: Pre-admission baseline Grooming: Needs assistance Is this a change from baseline?: Pre-admission baseline Feeding: Needs assistance Is this a change from baseline?: Pre-admission baseline Bathing: Needs assistance Is this a change from baseline?: Pre-admission baseline Toileting: Needs assistance Is this a change from baseline?: Pre-admission baseline In/Out Bed: Needs assistance Is this a change from baseline?: Pre-admission baseline Walks in Home: Independent with device (comment) Does the patient have difficulty walking or climbing stairs?: Yes Weakness of Legs: Both Weakness of Arms/Hands: Both  Permission Sought/Granted        Alcohol / Substance Use: Not Applicable Psych Involvement: No (comment)  Admission diagnosis:  Weakness [R53.1] Acute respiratory failure with hypoxia ([place]) [J96.01] Acute on chronic congestive heart failure ([place]) [I50.9] Acute on chronic congestive heart failure, unspecified heart failure type ([place]) [I50.9] Patient Active Problem List   Diagnosis Date Noted   Biventricular heart failure ([place]) 06/18/2022   Moderate tricuspid valve regurgitation 06/18/2022   Acute on chronic congestive heart failure ([place]) 06/16/2022   Prolonged QT interval 06/16/2022  Benign essential hypertension 07/24/2020   Pure hypercholesterolemia 07/24/2020    Type 2 diabetes mellitus without complications ([place]) [id number]   Acute on chronic combined systolic and diastolic CHF (congestive heart failure) ([place]) 06/28/2020   Acute respiratory failure with hypoxia (HCC) [id number]   Acute systolic CHF (congestive heart failure) ([place]) 06/27/2020   Chronic renal insufficiency, stage III (moderate) (HCC) 06/27/2020   Hyponatremia 06/27/2020   Acute CHF (congestive heart failure) ([place]) 06/26/2020   Cerebrovascular disease 04/10/2013   CAD (coronary artery disease) 10/27/2012   Bruit 10/27/2012   Hyperlipidemia 10/27/2012   Fever 10/07/2012   Anemia, normocytic normochromic 10/07/2012   SVT (supraventricular tachycardia) ([place]) 10/03/2012   Fasting hyperglycemia    HTN (hypertension)    Acute myocardial infarction, subendocardial infarction, initial episode of care ([place]) 10/02/2012   PCP:  [facility] [place] [facility], MD Pharmacy:   [facility] [address] [place] [address] Phone: [phone] Fax: [phone]   Readmission Risk Interventions    06/18/2022    4:40 PM  Readmission Risk Prevention Plan  Transportation Screening Complete  PCP or Specialist Appt within 3-5 Days Not Complete  HRI or [place] Complete  Social Work Consult for [place] Planning/Counseling Complete  Palliative Care Screening Complete  Medication Review Press photographer) Complete

## 2022-06-19 NOTE — Progress Notes (Signed)
PROGRESS NOTE  Craigory Mcneary [id number] DOB: 02/18/1922   PCP: [place]mount Nation, MD  Patient is from: Home.  Lives alone.  Has aides   DOA: 06/16/2022 LOS: 3  Chief complaints Chief Complaint  Patient presents with   Leg Swelling     Brief Narrative / Interim history: 88 year old M with PMH of combined CHF, CAD/CABG 1993, CKD-3B, PAD, A-fib not on AC, hypothyroidism and DM-2 brought to ED by EMS due to bilateral upper and lower extremity swelling/edema, and admitted for acute on chronic combined CHF.  Patient is on torsemide 20 mg every other day.  Reports good compliance with diuretics but admits to fluid indiscretion.  Had no respiratory symptoms.  RLE venous Doppler negative for DVT.  BNP elevated to 2282.  CXR with small left pleural effusion.  EKG shows atrial fibrillation at 58/min and QTc of 527.  Patient was started on IV Lasix and admitted.  Cardiology consulted.  TTE with LVEF of 20 to 25%, GH, indeterminate DD, severely reduced RV SF, severe LAE and RAE and moderate to severe TVR.  Remains on IV Lasix.  Likely home on 10/15.  Subjective: Seen and examined earlier this morning.  No major events overnight of this morning.  Per RN, patient's son was concerned about patient's urine color and asked if diuretics can be decreased.  Patient has no complaints.  Daughter-in-law at bedside.  Patient's urine looks clean in Foley bag.  Objective: Vitals:   06/18/22 2051 06/19/22 0502 06/19/22 0623 06/19/22 1413  BP: 110/73 107/70  (!) 101/55  Pulse: 64 64  61  Resp: 18 20  (!) 22  Temp: 98.1 F (36.7 C) 98 F (36.7 C)  98.8 F (37.1 C)  TempSrc: Oral   Oral  SpO2: 94% 97%  97%  Weight:   69.1 kg   Height:        Examination:  GENERAL: No apparent distress.  Nontoxic. HEENT: MMM.  Vision and hearing grossly intact.  NECK: Supple.  No apparent JVD.  RESP:  No IWOB.  Fair aeration bilaterally. CVS:  RRR. Heart sounds normal.  ABD/GI/GU: BS+. Abd soft, NTND.   Indwelling Foley in place. MSK/EXT:  Moves extremities. No apparent deformity.  Trace BLE edema, right> left. SKIN: no apparent skin lesion or wound NEURO: Awake and alert. Oriented appropriately.  No apparent focal neuro deficit. PSYCH: Calm. Normal affect.   Procedures:  None  Microbiology summarized: [id number] and influenza PCR nonreactive.  Assessment and plan: Principal Problem:   Acute on chronic combined systolic and diastolic CHF (congestive heart failure) (HCC) Active Problems:   Acute respiratory failure with hypoxia (HCC)   SVT (supraventricular tachycardia) (HCC)   HTN (hypertension)   CAD (coronary artery disease)   Chronic renal insufficiency, stage III (moderate) (HCC)   Benign essential hypertension   Type 2 diabetes mellitus without complications (HCC)   Acute on chronic congestive heart failure (HCC)   Prolonged QT interval   Biventricular heart failure (HCC)   Moderate tricuspid valve regurgitation  Acute on chronic combined CHF/biventricular failure: present with all extremity edema.  No chest pain or dyspnea.  CXR with trace bilateral pleural effusion.  BNP elevated to 2282 but no recent values for comparison.  TTE as above.  Started on IV Lasix.  Net -6.6 L so far. Cr stable. -Appreciate help by cardiology -Decreased IV Lasix to 40 mg daily -Monitor renal functions, electrolytes -Counseled on fluid restriction moving forward -Palliative medicine consulted  Acute respiratory failure with hypoxia: Desaturated  89% on room air with mild exertion.  Likely due to the above.  CXR showed trace pleural effusion.  Now on room air. -Manage CHF as above -Encourage incentive spirometry  Tricuspid valve regurgitation: TTE shows moderate to severe TVR.  Not a candidate for surgical intervention. -Cardiology following  Persistent A-fib: Rate controlled.  On amiodarone.  Not on anticoagulation but low-dose aspirin. -Continue home amiodarone and aspirin  History of  CAD/CABG 1993/PAD: No chest pain.  Mildly elevated troponin likely demand ischemia.  TTE as above. -Continue home meds.  Acute urinary retention: Suspect BPH.  Foley inserted and had 600 cc UOP right away on 10/12  -Continue home Cardura -Add bethanechol. -Voiding trial today  CKD-3B: Stable. Recent Labs    05/12/22 0900 06/16/22 1411 06/17/22 0504 06/18/22 0323 06/19/22 0341  BUN 49* 43* 41* 36* 35*  CREATININE 1.69* 1.62* 1.49* 1.36* 1.45*  -Continue monitoring  Essential hypertension: Normotensive for most part. -Continue losartan, Cardura and Lasix  Prolonged QT: QTc 527.  Exaggerated by wide QRS. -Minimize QT prolonging drugs -Continue telemetry monitoring -Optimize K and Mg  Uncontrolled NIDDM-2 with hyperglycemia: A1c 8.0% in 06/2020. Recent Labs  Lab 06/18/22 0709 06/18/22 1101 06/18/22 1611 06/18/22 2053 06/19/22 0758  GLUCAP 141* 166* 190* 154* 146*  -Continue current insulin regimen and started  Thrombocytopenia: Stable. Recent Labs  Lab 06/16/22 1411 06/18/22 0323 06/19/22 0341  PLT 124* 122* 125*  -Continue monitoring   Physical deconditioning: Lives alone. -PT/OT eval  Goal of care counseling: Confirmed DNR/DNI with patient's and patient's son at bedside.  Given age and comorbidities, I think he is appropriate at least for home hospice. -Palliative medicine consulted   Body mass index is 21.25 kg/m.           DVT prophylaxis:  enoxaparin (LOVENOX) injection 30 mg Start: 06/19/22 2100  Code Status: DNR/DNI Family Communication: Updated daughter-in-law at bedside. Level of care: Telemetry Status is: Inpatient Remains inpatient appropriate because: Acute combined CHF   Final disposition: Home on 10/15. Consultants:  Cardiology Palliative medicine  Sch Meds:  Scheduled Meds:  amiodarone  100 mg Oral Daily   ammonium lactate  1 Application Topical BID   aspirin EC  81 mg Oral Daily   atorvastatin  10 mg Oral QHS    bethanechol  10 mg Oral TID   dorzolamide-timolol  1 drop Both Eyes BID   doxazosin  2 mg Oral QHS   enoxaparin (LOVENOX) injection  30 mg Subcutaneous Q24H   furosemide  40 mg Intravenous Q12H   insulin aspart  0-5 Units Subcutaneous QHS   insulin aspart  0-9 Units Subcutaneous TID WC   latanoprost  1 drop Both Eyes QPM   levothyroxine  88 mcg Oral Q0600   losartan  50 mg Oral Daily   nystatin   Topical TID   spironolactone  12.5 mg Oral Daily   Continuous Infusions: PRN Meds:.acetaminophen **OR** acetaminophen, polyethylene glycol, senna-docusate  Antimicrobials: Anti-infectives (From admission, onward)    None        I have personally reviewed the following labs and images: CBC: Recent Labs  Lab 06/16/22 1411 06/18/22 0323 06/19/22 0341  WBC 5.3 6.3 6.2  NEUTROABS 4.0  --   --   HGB 11.9* 12.0* 11.9*  HCT 36.7* 36.2* 36.5*  MCV 96.6 94.5 95.5  PLT 124* 122* 125*   BMP &GFR Recent Labs  Lab 06/16/22 1411 06/16/22 1549 06/17/22 0504 06/18/22 0323 06/19/22 0341  NA 139  --  141  140 137  K 4.2  --  3.8 3.5 3.8  CL 110  --  111 107 105  CO2 22  --  22 25 25   GLUCOSE 187*  --  143* 126* 150*  BUN 43*  --  41* 36* 35*  CREATININE 1.62*  --  1.49* 1.36* 1.45*  CALCIUM 8.5*  --  8.6* 8.4* 8.1*  MG  --  2.4  --  2.1 2.1  PHOS  --   --   --  3.4 3.3   Estimated Creatinine Clearance: 27.8 mL/min (A) (by C-G formula based on SCr of 1.45 mg/dL (H)). Liver & Pancreas: Recent Labs  Lab 06/18/22 0323 06/19/22 0341  AST 18  --   ALT 30  --   ALKPHOS 69  --   BILITOT 0.9  --   PROT 5.1*  --   ALBUMIN 3.0*  3.1* 2.8*   No results for input(s): "LIPASE", "AMYLASE" in the last 168 hours. No results for input(s): "AMMONIA" in the last 168 hours. Diabetic: Recent Labs    06/17/22 0504  HGBA1C 8.4*   Recent Labs  Lab 06/18/22 0709 06/18/22 1101 06/18/22 1611 06/18/22 2053 06/19/22 0758  GLUCAP 141* 166* 190* 154* 146*   Cardiac Enzymes: No results  for input(s): "CKTOTAL", "CKMB", "CKMBINDEX", "TROPONINI" in the last 168 hours. No results for input(s): "PROBNP" in the last 8760 hours. Coagulation Profile: No results for input(s): "INR", "PROTIME" in the last 168 hours. Thyroid Function Tests: No results for input(s): "TSH", "T4TOTAL", "FREET4", "T3FREE", "THYROIDAB" in the last 72 hours.  Lipid Profile: No results for input(s): "CHOL", "HDL", "LDLCALC", "TRIG", "CHOLHDL", "LDLDIRECT" in the last 72 hours. Anemia Panel: No results for input(s): "VITAMINB12", "FOLATE", "FERRITIN", "TIBC", "IRON", "RETICCTPCT" in the last 72 hours. Urine analysis:    Component Value Date/Time   COLORURINE STRAW (A) 06/16/2022 [place] 06/16/2022 1548   LABSPEC 1.008 06/16/2022 1548   PHURINE 5.0 06/16/2022 [place] 06/16/2022 1548   HGBUR NEGATIVE 06/16/2022 [place] 06/16/2022 [place] 06/16/2022 1548   PROTEINUR NEGATIVE 06/16/2022 1548   UROBILINOGEN 1.0 10/03/2012 1151   NITRITE NEGATIVE 06/16/2022 1548   LEUKOCYTESUR SMALL (A) 06/16/2022 1548   Sepsis Labs: Invalid input(s): "PROCALCITONIN", "LACTICIDVEN"  Microbiology: Recent Results (from the past 240 hour(s))  Resp Panel by RT-PCR (Flu A&B, Covid) Anterior Nasal Swab     Status: None   Collection Time: 06/16/22  1:57 PM   Specimen: Anterior Nasal Swab  Result Value Ref Range Status   SARS Coronavirus 2 by RT PCR NEGATIVE NEGATIVE Final    Comment: (NOTE) SARS-CoV-2 target nucleic acids are NOT DETECTED.  The SARS-CoV-2 RNA is generally detectable in upper respiratory specimens during the acute phase of infection. The lowest concentration of SARS-CoV-2 viral copies this assay can detect is 138 copies/mL. A negative result does not preclude SARS-Cov-2 infection and should not be used as the sole basis for treatment or other patient management decisions. A negative result may occur with  improper specimen  collection/handling, submission of specimen other than nasopharyngeal swab, presence of viral mutation(s) within the areas targeted by this assay, and inadequate number of viral copies(<138 copies/mL). A negative result must be combined with clinical observations, patient history, and epidemiological information. The expected result is Negative.  Fact Sheet for Patients:  EntrepreneurPulse.com.au  Fact Sheet for Healthcare Providers:  IncredibleEmployment.be  This test is no t yet approved or cleared by the  [place] [employer] and  has been authorized for detection and/or diagnosis of SARS-CoV-2 by FDA under an Print production planner (EUA). This EUA will remain  in effect (meaning this test can be used) for the duration of the COVID-19 declaration under Section 564(b)(1) of the Act, 21 U.S.C.section 360bbb-3(b)(1), unless the authorization is terminated  or revoked sooner.       Influenza A by PCR NEGATIVE NEGATIVE Final   Influenza B by PCR NEGATIVE NEGATIVE Final    Comment: (NOTE) The Xpert Xpress SARS-CoV-2/FLU/RSV plus assay is intended as an aid in the diagnosis of influenza from Nasopharyngeal swab specimens and should not be used as a sole basis for treatment. Nasal washings and aspirates are unacceptable for Xpert Xpress SARS-CoV-2/FLU/RSV testing.  Fact Sheet for Patients: EntrepreneurPulse.com.au  Fact Sheet for Healthcare Providers: IncredibleEmployment.be  This test is not yet approved or cleared by the [place] FDA and has been authorized for detection and/or diagnosis of SARS-CoV-2 by FDA under an Emergency Use Authorization (EUA). This EUA will remain in effect (meaning this test can be used) for the duration of the COVID-19 declaration under Section 564(b)(1) of the Act, 21 U.S.C. section 360bbb-3(b)(1), unless the authorization is terminated or revoked.  Performed at [facility], [address], [place], [place] [address]     Radiology Studies: No results found.    Tricia Pledger T. [place]  If 7PM-7AM, please contact night-coverage [email or web address] 06/19/2022, 3:07 PM

## 2022-06-20 DIAGNOSIS — I5082 Biventricular heart failure: Secondary | ICD-10-CM

## 2022-06-20 DIAGNOSIS — E119 Type 2 diabetes mellitus without complications: Secondary | ICD-10-CM | POA: Diagnosis not present

## 2022-06-20 DIAGNOSIS — R9431 Abnormal electrocardiogram [ECG] [EKG]: Secondary | ICD-10-CM | POA: Diagnosis not present

## 2022-06-20 DIAGNOSIS — J9601 Acute respiratory failure with hypoxia: Secondary | ICD-10-CM | POA: Diagnosis not present

## 2022-06-20 DIAGNOSIS — Z66 Do not resuscitate: Secondary | ICD-10-CM

## 2022-06-20 DIAGNOSIS — I5043 Acute on chronic combined systolic (congestive) and diastolic (congestive) heart failure: Secondary | ICD-10-CM | POA: Diagnosis not present

## 2022-06-20 LAB — CBC
HCT: 36.3 % — ABNORMAL LOW (ref 39.0–52.0)
Hemoglobin: 11.9 g/dL — ABNORMAL LOW (ref 13.0–17.0)
MCH: 31.2 pg (ref 26.0–34.0)
MCHC: 32.8 g/dL (ref 30.0–36.0)
MCV: 95 fL (ref 80.0–100.0)
Platelets: 113 10*3/uL — ABNORMAL LOW (ref 150–400)
RBC: 3.82 MIL/uL — ABNORMAL LOW (ref 4.22–5.81)
RDW: 14.4 % (ref 11.5–15.5)
WBC: 6.3 10*3/uL (ref 4.0–10.5)
nRBC: 0 % (ref 0.0–0.2)

## 2022-06-20 LAB — GLUCOSE, CAPILLARY
Glucose-Capillary: 183 mg/dL — ABNORMAL HIGH (ref 70–99)
Glucose-Capillary: 165 mg/dL — ABNORMAL HIGH (ref 70–99)
Glucose-Capillary: 114 mg/dL — ABNORMAL HIGH (ref 70–99)
Glucose-Capillary: 280 mg/dL — ABNORMAL HIGH (ref 70–99)

## 2022-06-20 LAB — RENAL FUNCTION PANEL
Albumin: 2.7 g/dL — ABNORMAL LOW (ref 3.5–5.0)
Anion gap: 7 (ref 5–15)
BUN: 40 mg/dL — ABNORMAL HIGH (ref 8–23)
CO2: 26 mmol/L (ref 22–32)
Calcium: 8.1 mg/dL — ABNORMAL LOW (ref 8.9–10.3)
Chloride: 104 mmol/L (ref 98–111)
Creatinine, Ser: 1.6 mg/dL — ABNORMAL HIGH (ref 0.61–1.24)
GFR, Estimated: 39 mL/min — ABNORMAL LOW (ref >60)
Glucose, Bld: 117 mg/dL — ABNORMAL HIGH (ref 70–99)
Phosphorus: 3.1 mg/dL (ref 2.5–4.6)
Potassium: 3.7 mmol/L (ref 3.5–5.1)
Sodium: 137 mmol/L (ref 135–145)

## 2022-06-20 LAB — MAGNESIUM: Magnesium: 2.3 mg/dL (ref 1.7–2.4)

## 2022-06-20 LAB — BRAIN NATRIURETIC PEPTIDE: B Natriuretic Peptide: 1874 pg/mL — ABNORMAL HIGH (ref 0.0–100.0)

## 2022-06-20 MED ORDER — SPIRONOLACTONE 25 MG PO TABS: 12.5000 mg | ORAL_TABLET | Freq: Every day | ORAL | 1 refills | Status: DC

## 2022-06-20 MED ORDER — POTASSIUM CHLORIDE CRYS ER 10 MEQ PO TBCR: 10.0000 meq | EXTENDED_RELEASE_TABLET | Freq: Every day | ORAL | 1 refills | Status: DC

## 2022-06-20 MED ORDER — TORSEMIDE 20 MG PO TABS: 20.0000 mg | ORAL_TABLET | Freq: Every day | ORAL | 2 refills | Status: DC

## 2022-06-20 MED ORDER — SENNOSIDES-DOCUSATE SODIUM 8.6-50 MG PO TABS: 1.0000 | ORAL_TABLET | Freq: Two times a day (BID) | ORAL | 1 refills | Status: DC | PRN

## 2022-06-20 MED ORDER — BETHANECHOL CHLORIDE 10 MG PO TABS: 10.0000 mg | ORAL_TABLET | Freq: Three times a day (TID) | ORAL | 0 refills | Status: DC

## 2022-06-20 MED ORDER — NYSTATIN 100000 UNIT/GM EX POWD: Freq: Three times a day (TID) | CUTANEOUS | 0 refills | Status: DC

## 2022-06-20 NOTE — Discharge Summary (Signed)
Physician Discharge Summary  Franklin Gonzalez [id number] DOB: 1922-07-24 DOA: 06/16/2022  PCP: [place] Nation, MD  Admit date: 06/16/2022 Discharge date: 06/20/2022 Admitted From: Home Disposition: Home Recommendations for Outpatient Follow-up:  Follow up with PCP in 1 to 2 weeks Outpatient follow-up with cardiology in 1 to 2 weeks May need outpatient follow-up with urology for possible BPH Consider referral to palliative care Check BP, fluid status, BMP and CBC in 1 week Please follow up on the following pending results: None  Home Health: PT/RN Equipment/Devices: Patient has appropriate DME  Discharge Condition: Stable CODE STATUS: DNR/DNI  Follow-up Information     [place] Nation, MD. Schedule an appointment as soon as possible for a visit in 1 week(s).   Specialty: Internal Medicine Contact information: [place] [phone]                 Hospital course 88 year old M with PMH of combined CHF, CAD/CABG 1993, CKD-3B, PAD, A-fib not on AC, hypothyroidism and DM-2 brought to ED by EMS due to bilateral upper and lower extremity swelling/edema, and admitted for acute on chronic combined CHF.  Patient is on torsemide 20 mg every other day.  Reports good compliance with diuretics but admits to fluid indiscretion.  Had no respiratory symptoms.  RLE venous Doppler negative for DVT.  BNP elevated to 2282.  CXR with small left pleural effusion.  EKG shows atrial fibrillation at 58/min and QTc of 527.  Patient was started on IV Lasix and admitted.  Cardiology consulted.   TTE with LVEF of 20 to 25%, GH, indeterminate DD, severely reduced RV SF, severe LAE and RAE and moderate to severe TVR.  Patient was diuresed with IV Lasix.  Net -6.7 L.  Cardiology recommended increasing torsemide to 20 mg daily instead of every other day.  Cardiology also added low-dose Aldactone.  Outpatient follow-up with PCP and cardiology in 1 to 2 weeks or sooner  if needed.  Hospital course complicated by acute urinary retention likely from BPH although no formal diagnosis.  Foley inserted on 06/17/2022.  Patient passed voiding trial on 06/19/2022.  He is discharged on home Cardura.  I added low-dose bethanechol for 3 days.  Recommend outpatient follow-up with urology  See individual problem list below for more.   Problems addressed during this hospitalization Principal Problem:   Acute on chronic combined systolic and diastolic CHF (congestive heart failure) (HCC) Active Problems:   Acute respiratory failure with hypoxia (HCC)   SVT (supraventricular tachycardia) (HCC)   HTN (hypertension)   CAD (coronary artery disease)   Chronic renal insufficiency, stage III (moderate) (HCC)   Benign essential hypertension   Type 2 diabetes mellitus without complications (HCC)   Acute on chronic congestive heart failure (HCC)   Prolonged QT interval   Biventricular heart failure (HCC)   Moderate tricuspid valve regurgitation   Acute on chronic combined CHF/biventricular failure: present with all extremity edema.  No chest pain or dyspnea.  CXR with trace bilateral pleural effusion.  BNP persistently elevated to 2000's suggesting poor prognosis.   TTE as above.  Started on IV Lasix.  Net -6.6 L so far. Cr stable. -Increased p.o. torsemide to 20 mg daily.  Was on 20 mg every other day -Cardiology added low-dose Aldactone.  He is already on losartan.  -Counseled on fluid restriction moving forward -Recommend referral to palliative care outpatient   Acute respiratory failure with hypoxia: Desaturated 89% on room air with mild exertion.  Likely due to the above.  CXR showed trace pleural effusion.  Now on room air.   Tricuspid valve regurgitation: TTE shows moderate to severe TVR.  Not a candidate for surgical intervention.  Defer to cardiology.   Persistent A-fib: Rate controlled.  On amiodarone.  Not on anticoagulation but low-dose aspirin. -Continue home  amiodarone and aspirin   History of CAD/CABG 1993/PAD: No chest pain.  Mildly elevated troponin likely demand ischemia.  TTE as above. -Continue home meds.   Acute urinary retention: Suspect BPH.  Foley inserted and had 600 cc UOP right away on 10/12.  Passed voiding trial on 10/14. -Continue home Cardura -Continue bethanechol 10 mg 3 times daily for 3 days -Consider outpatient urology follow-up   CKD-3B: Stable. -Check renal function in 1 to 2 weeks   Essential hypertension: Normotensive for most part. -Continue losartan, Cardura, Aldactone and torsemide   Prolonged QT: QTc 527.  Exaggerated by wide QRS. -Minimize QT prolonging drugs -Minimize QT prolonging drugs   Uncontrolled NIDDM-2 with hyperglycemia: A1c 8.0% in 06/2020. -Continue home glipizide.   Thrombocytopenia: Stable.  Physical deconditioning: Lives alone but has aides and good family support. -HHPT/RN ordered   Goal of care counseling: DNR/DNI. -Consider referral to palliative care outpatient           Vital signs Vitals:   06/19/22 2053 06/20/22 0404 06/20/22 0527 06/20/22 0956  BP: 107/76  112/60 (!) 117/58  Pulse: 71  (!) 58 63  Temp: 98.4 F (36.9 C)  (!) 97.3 F (36.3 C) 98 F (36.7 C)  Resp: 20  18 18   Height:      Weight:  72.3 kg    SpO2: 98%  97% 92%  TempSrc: Oral  Oral Oral  BMI (Calculated):  22.24       Discharge exam  GENERAL: No apparent distress.  Nontoxic. HEENT: MMM.  Vision and hearing grossly intact.  NECK: Supple.  No apparent JVD.  RESP:  No IWOB.  Fair aeration bilaterally. CVS: Regular rhythm.  Normal rate.  Heart sounds normal.  ABD/GI/GU: BS+. Abd soft, NTND.  MSK/EXT:  Moves extremities.  Significant muscle mass and subcu fat loss.  Trace edema.  Faint DP pulses. SKIN: no apparent skin lesion or wound NEURO: Awake and alert. Oriented appropriately.  No apparent focal neuro deficit. PSYCH: Calm. Normal affect.   Discharge Instructions Discharge Instructions      (HEART FAILURE PATIENTS) Call MD:  Anytime you have any of the following symptoms: 1) 3 pound weight gain in 24 hours or 5 pounds in 1 week 2) shortness of breath, with or without a dry hacking cough 3) swelling in the hands, feet or stomach 4) if you have to sleep on extra pillows at night in order to breathe.   Complete by: As directed    Call MD for:  difficulty breathing, headache or visual disturbances   Complete by: As directed    Call MD for:  extreme fatigue   Complete by: As directed    Call MD for:  persistant dizziness or light-headedness   Complete by: As directed    Diet - low sodium heart healthy   Complete by: As directed    Discharge instructions   Complete by: As directed    It has been a pleasure taking care of you!  You were hospitalized due to heart failure exacerbation for which you have been treated with fluid medication.  We have made adjustment to your fluid medications.  Please review your  new medication list and the directions on your medications before you take them.  Follow-up with your primary care doctor and cardiologist in 1 to 2 weeks or sooner if needed.  In addition to taking your medications as prescribed, we also recommend you avoid alcohol or over-the-counter pain medication other than plain Tylenol, limit the amount of water/fluid you drink to less than 6 cups (1500 cc) a day,  limit your sodium (salt) intake to less than 2 g (2000 mg) a day and weigh yourself daily at the same time and keeping your weight log.     Take care,   Increase activity slowly   Complete by: As directed       Allergies as of 06/20/2022   No Known Allergies      Medication List     TAKE these medications    amiodarone 200 MG tablet Commonly known as: PACERONE Take 1/2 (one-half) tablet by mouth once daily   ammonium lactate 12 % cream Commonly known as: AMLACTIN Apply topically 2 (two) times daily.   aspirin EC 81 MG tablet Take 1 tablet (81 mg total) by  mouth daily.   atorvastatin 10 MG tablet Commonly known as: LIPITOR Take 10 mg by mouth at bedtime.   bethanechol 10 MG tablet Commonly known as: URECHOLINE Take 1 tablet (10 mg total) by mouth 3 (three) times daily.   dorzolamide-timolol 2-0.5 % ophthalmic solution Commonly known as: COSOPT Place 1 drop into both eyes 2 (two) times daily.   doxazosin 4 MG tablet Commonly known as: CARDURA Take 2 mg by mouth at bedtime.   Euthyrox 75 MCG tablet Generic drug: levothyroxine Take 75 mcg by mouth daily.   levothyroxine 88 MCG tablet Commonly known as: SYNTHROID Take 88 mcg by mouth daily.   glipiZIDE 5 MG tablet Commonly known as: GLUCOTROL Take 2.5 mg by mouth daily.   latanoprost 0.005 % ophthalmic solution Commonly known as: XALATAN Place 1 drop into both eyes every evening.   losartan 50 MG tablet Commonly known as: COZAAR Take 1 tablet by mouth once daily   nitroGLYCERIN 0.4 MG SL tablet Commonly known as: NITROSTAT Place 1 tablet (0.4 mg total) under the tongue every 5 (five) minutes x 3 doses as needed for chest pain.   nystatin powder Commonly known as: MYCOSTATIN/NYSTOP Apply topically 3 (three) times daily.   potassium chloride 10 MEQ tablet Commonly known as: KLOR-CON M Take 1 tablet (10 mEq total) by mouth daily. What changed:  medication strength how much to take   senna-docusate 8.6-50 MG tablet Commonly known as: Senokot-S Take 1 tablet by mouth 2 (two) times daily as needed for moderate constipation.   spironolactone 25 MG tablet Commonly known as: ALDACTONE Take 0.5 tablets (12.5 mg total) by mouth daily. Start taking on: June 21, 2022   torsemide 20 MG tablet Commonly known as: DEMADEX Take 1 tablet (20 mg total) by mouth daily. Take one tablet every other day What changed:  how much to take how to take this when to take this        Consultations: Cardiology  Procedures/Studies:   ECHOCARDIOGRAM COMPLETE  Result Date:  06/17/2022    ECHOCARDIOGRAM REPORT   Patient Name:   Franklin Gonzalez Date of Exam: 06/17/2022 Medical Rec #:  [id number]       Height:       71.0 in Accession #:    [id number]      Weight:       159.6 lb Date  of Birth:  01-Nov-1921       BSA:          1.916 m Patient Age:    53 years        BP:           117/74 mmHg Patient Gender: M               HR:           57 bpm. Exam Location:  [facility] Procedure: 2D Echo Indications:    congestive heart failure  History:        Patient has prior history of Echocardiogram examinations, most                 recent 06/27/2020. CHF, CAD; Risk Factors:Hypertension,                 Dyslipidemia and Diabetes.  Sonographer:    Johny Chess RDCS Referring Phys: Marina  1. Left ventricular ejection fraction, by estimation, is 20 to 25%. The left ventricle has severely decreased function. The left ventricle demonstrates global hypokinesis. The left ventricular internal cavity size was mildly dilated. Left ventricular diastolic parameters are indeterminate.  2. Right ventricular systolic function is severely reduced. The right ventricular size is normal. There is moderately elevated pulmonary artery systolic pressure. The estimated right ventricular systolic pressure is [id number] mmHg.  3. Left atrial size was severely dilated.  4. Right atrial size was severely dilated.  5. The mitral valve is normal in structure. Trivial mitral valve regurgitation. No evidence of mitral stenosis.  6. Tricuspid valve regurgitation is moderate to severe.  7. The aortic valve was not well visualized. Aortic valve regurgitation is trivial. Aortic valve sclerosis/calcification is present, without any evidence of aortic stenosis.  8. The inferior vena cava is dilated in size with <50% respiratory variability, suggesting right atrial pressure of 15 mmHg. FINDINGS  Left Ventricle: Left ventricular ejection fraction, by estimation, is 20 to 25%. The left ventricle has severely  decreased function. The left ventricle demonstrates global hypokinesis. The left ventricular internal cavity size was mildly dilated. Suboptimal image quality limits for assessment of left ventricular hypertrophy. Left ventricular diastolic parameters are indeterminate. Right Ventricle: The right ventricular size is normal. No increase in right ventricular wall thickness. Right ventricular systolic function is severely reduced. There is moderately elevated pulmonary artery systolic pressure. The tricuspid regurgitant velocity is 2.83 m/s, and with an assumed right atrial pressure of 15 mmHg, the estimated right ventricular systolic pressure is [id number] mmHg. Left Atrium: Left atrial size was severely dilated. Right Atrium: Right atrial size was severely dilated. Pericardium: Trivial pericardial effusion is present. Mitral Valve: The mitral valve is normal in structure. Trivial mitral valve regurgitation. No evidence of mitral valve stenosis. Tricuspid Valve: The tricuspid valve is normal in structure. Tricuspid valve regurgitation is moderate to severe. Aortic Valve: The aortic valve was not well visualized. Aortic valve regurgitation is trivial. Aortic valve sclerosis/calcification is present, without any evidence of aortic stenosis. Pulmonic Valve: The pulmonic valve was not well visualized. Pulmonic valve regurgitation is not visualized. Aorta: The aortic root is normal in size and structure. Venous: The inferior vena cava is dilated in size with less than 50% respiratory variability, suggesting right atrial pressure of 15 mmHg. IAS/Shunts: The interatrial septum was not well visualized.  LEFT VENTRICLE PLAX 2D LVIDd:         6.10 cm LVIDs:         5.30  cm LV PW:         0.90 cm LV IVS:        0.90 cm LVOT diam:     2.20 cm LV SV:         26 LV SV Index:   14 LVOT Area:     3.80 cm  LV Volumes (MOD) LV vol d, MOD A4C: 147.0 ml LV vol s, MOD A4C: 122.0 ml LV SV MOD A4C:     147.0 ml RIGHT VENTRICLE            IVC RV  S prime:     3.92 cm/s  IVC diam: 2.60 cm LEFT ATRIUM              Index        RIGHT ATRIUM           Index LA diam:        4.30 cm  2.24 cm/m   RA Area:     25.60 cm LA Vol (A2C):   133.0 ml 69.42 ml/m  RA Volume:   91.10 ml  47.55 ml/m LA Vol (A4C):   93.1 ml  48.60 ml/m LA Biplane Vol: 113.0 ml 58.98 ml/m  AORTIC VALVE LVOT Vmax:   41.60 cm/s LVOT Vmean:  25.700 cm/s LVOT VTI:    0.068 m  AORTA Ao Root diam: 3.50 cm MITRAL VALVE               TRICUSPID VALVE MV Area (PHT): 4.24 cm    TR Peak grad:   32.0 mmHg MV Decel Time: 179 msec    TR Vmax:        283.00 cm/s MV E velocity: 61.30 cm/s MV A velocity: 36.40 cm/s  SHUNTS MV E/A ratio:  1.68        Systemic VTI:  0.07 m                            Systemic Diam: 2.20 cm Oswaldo Milian MD Electronically signed by Oswaldo Milian MD Signature Date/Time: 06/17/2022/4:58:14 PM    Final    US Venous Img Lower Unilateral Right  Result Date: 06/16/2022 CLINICAL DATA:  Right lower extremity edema. EXAM: RIGHT LOWER EXTREMITY VENOUS DOPPLER ULTRASOUND TECHNIQUE: Gray-scale sonography with graded compression, as well as color Doppler and duplex ultrasound were performed to evaluate the lower extremity deep venous systems from the level of the common femoral vein and including the common femoral, femoral, profunda femoral, popliteal and calf veins including the posterior tibial, peroneal and gastrocnemius veins when visible. The superficial great saphenous vein was also interrogated. Spectral Doppler was utilized to evaluate flow at rest and with distal augmentation maneuvers in the common femoral, femoral and popliteal veins. COMPARISON:  None Available. FINDINGS: Contralateral Common Femoral Vein: Respiratory phasicity is normal and symmetric with the symptomatic side. No evidence of thrombus. Normal compressibility. Common Femoral Vein: No evidence of thrombus. Normal compressibility, respiratory phasicity and response to augmentation.  Saphenofemoral Junction: No evidence of thrombus. Normal compressibility and flow on color Doppler imaging. Profunda Femoral Vein: No evidence of thrombus. Normal compressibility and flow on color Doppler imaging. Femoral Vein: No evidence of thrombus. Normal compressibility, respiratory phasicity and response to augmentation. Popliteal Vein: No evidence of thrombus. Normal compressibility, respiratory phasicity and response to augmentation. Calf Veins: No evidence of thrombus. Normal compressibility and flow on color Doppler imaging. Superficial Great Saphenous Vein: No evidence of thrombus. Normal compressibility.  Venous Reflux:  None. Other Findings: No evidence of superficial thrombophlebitis or abnormal fluid collection. IMPRESSION: No evidence of right lower extremity deep venous thrombosis. Electronically Signed   By: Aletta Edouard M.D.   On: 06/16/2022 15:04   DG Chest Port 1 View  Result Date: 06/16/2022 CLINICAL DATA:  Leg edema EXAM: PORTABLE CHEST 1 VIEW COMPARISON:  Chest 12/23/2021 FINDINGS: Prior median sternotomy and CABG. Cardiac enlargement. Negative for heart failure or edema. Small left pleural effusion with improvement. No significant right effusion Mild bibasilar atelectasis. IMPRESSION: 1. Cardiac enlargement without heart failure. 2. Small left pleural effusion with improvement. Electronically Signed   By: Franchot Gallo M.D.   On: 06/16/2022 14:37       The results of significant diagnostics from this hospitalization (including imaging, microbiology, ancillary and laboratory) are listed below for reference.     Microbiology: Recent Results (from the past 240 hour(s))  Resp Panel by RT-PCR (Flu A&B, Covid) Anterior Nasal Swab     Status: None   Collection Time: 06/16/22  1:57 PM   Specimen: Anterior Nasal Swab  Result Value Ref Range Status   SARS Coronavirus 2 by RT PCR NEGATIVE NEGATIVE Final    Comment: (NOTE) SARS-CoV-2 target nucleic acids are NOT DETECTED.  The  SARS-CoV-2 RNA is generally detectable in upper respiratory specimens during the acute phase of infection. The lowest concentration of SARS-CoV-2 viral copies this assay can detect is 138 copies/mL. A negative result does not preclude SARS-Cov-2 infection and should not be used as the sole basis for treatment or other patient management decisions. A negative result may occur with  improper specimen collection/handling, submission of specimen other than nasopharyngeal swab, presence of viral mutation(s) within the areas targeted by this assay, and inadequate number of viral copies(<138 copies/mL). A negative result must be combined with clinical observations, patient history, and epidemiological information. The expected result is Negative.  Fact Sheet for Patients:  EntrepreneurPulse.com.au  Fact Sheet for Healthcare Providers:  IncredibleEmployment.be  This test is no t yet approved or cleared by the [place] FDA and  has been authorized for detection and/or diagnosis of SARS-CoV-2 by FDA under an Emergency Use Authorization (EUA). This EUA will remain  in effect (meaning this test can be used) for the duration of the COVID-19 declaration under Section 564(b)(1) of the Act, 21 U.S.C.section 360bbb-3(b)(1), unless the authorization is terminated  or revoked sooner.       Influenza A by PCR NEGATIVE NEGATIVE Final   Influenza B by PCR NEGATIVE NEGATIVE Final    Comment: (NOTE) The Xpert Xpress SARS-CoV-2/FLU/RSV plus assay is intended as an aid in the diagnosis of influenza from Nasopharyngeal swab specimens and should not be used as a sole basis for treatment. Nasal washings and aspirates are unacceptable for Xpert Xpress SARS-CoV-2/FLU/RSV testing.  Fact Sheet for Patients: EntrepreneurPulse.com.au  Fact Sheet for Healthcare Providers: IncredibleEmployment.be  This test is not yet approved or  cleared by the [place] FDA and has been authorized for detection and/or diagnosis of SARS-CoV-2 by FDA under an Emergency Use Authorization (EUA). This EUA will remain in effect (meaning this test can be used) for the duration of the COVID-19 declaration under Section 564(b)(1) of the Act, 21 U.S.C. section 360bbb-3(b)(1), unless the authorization is terminated or revoked.  Performed at [facility], [address], [place], [place] [address]      Labs:  CBC: Recent Labs  Lab 06/16/22 1411 06/18/22 0323 06/19/22 0341 06/20/22 0401  WBC 5.3 6.3  6.2 6.3  NEUTROABS 4.0  --   --   --   HGB 11.9* 12.0* 11.9* 11.9*  HCT 36.7* 36.2* 36.5* 36.3*  MCV 96.6 94.5 95.5 95.0  PLT 124* 122* 125* 113*   BMP &GFR Recent Labs  Lab 06/16/22 1411 06/16/22 1549 06/17/22 0504 06/18/22 0323 06/19/22 0341 06/20/22 0401  NA 139  --  141 140 137 137  K 4.2  --  3.8 3.5 3.8 3.7  CL 110  --  111 107 105 104  CO2 22  --  22 25 25 26   GLUCOSE 187*  --  143* 126* 150* 117*  BUN 43*  --  41* 36* 35* 40*  CREATININE 1.62*  --  1.49* 1.36* 1.45* 1.60*  CALCIUM 8.5*  --  8.6* 8.4* 8.1* 8.1*  MG  --  2.4  --  2.1 2.1 2.3  PHOS  --   --   --  3.4 3.3 3.1   Estimated Creatinine Clearance: 26.4 mL/min (A) (by C-G formula based on SCr of 1.6 mg/dL (H)). Liver & Pancreas: Recent Labs  Lab 06/18/22 0323 06/19/22 0341 06/20/22 0401  AST 18  --   --   ALT 30  --   --   ALKPHOS 69  --   --   BILITOT 0.9  --   --   PROT 5.1*  --   --   ALBUMIN 3.0*  3.1* 2.8* 2.7*   No results for input(s): "LIPASE", "AMYLASE" in the last 168 hours. No results for input(s): "AMMONIA" in the last 168 hours. Diabetic: No results for input(s): "HGBA1C" in the last 72 hours. Recent Labs  Lab 06/19/22 1122 06/19/22 1648 06/19/22 2132 06/20/22 0743 06/20/22 1119  GLUCAP 183* 237* 165* 114* 280*   Cardiac Enzymes: No results for input(s): "CKTOTAL", "CKMB", "CKMBINDEX", "TROPONINI" in the last 168  hours. No results for input(s): "PROBNP" in the last 8760 hours. Coagulation Profile: No results for input(s): "INR", "PROTIME" in the last 168 hours. Thyroid Function Tests: No results for input(s): "TSH", "T4TOTAL", "FREET4", "T3FREE", "THYROIDAB" in the last 72 hours. Lipid Profile: No results for input(s): "CHOL", "HDL", "LDLCALC", "TRIG", "CHOLHDL", "LDLDIRECT" in the last 72 hours. Anemia Panel: No results for input(s): "VITAMINB12", "FOLATE", "FERRITIN", "TIBC", "IRON", "RETICCTPCT" in the last 72 hours. Urine analysis:    Component Value Date/Time   COLORURINE STRAW (A) 06/16/2022 [place] 06/16/2022 1548   LABSPEC 1.008 06/16/2022 1548   PHURINE 5.0 06/16/2022 [place] 06/16/2022 1548   HGBUR NEGATIVE 06/16/2022 [place] 06/16/2022 [place] 06/16/2022 1548   PROTEINUR NEGATIVE 06/16/2022 1548   UROBILINOGEN 1.0 10/03/2012 1151   NITRITE NEGATIVE 06/16/2022 1548   LEUKOCYTESUR SMALL (A) 06/16/2022 1548   Sepsis Labs: Invalid input(s): "PROCALCITONIN", "LACTICIDVEN"   SIGNED:  Mercy Riding, MD  [facility] 06/20/2022, 1:50 PM

## 2022-06-20 NOTE — Plan of Care (Signed)
Problem: Education: Goal: Ability to demonstrate management of disease process will improve Outcome: Adequate for Discharge Goal: Ability to verbalize understanding of medication therapies will improve Outcome: Adequate for Discharge Goal: Individualized Educational Video(s) Outcome: Adequate for Discharge   Problem: Activity: Goal: Capacity to carry out activities will improve Outcome: Adequate for Discharge   Problem: Cardiac: Goal: Ability to achieve and maintain adequate cardiopulmonary perfusion will improve Outcome: Adequate for Discharge   Problem: Education: Goal: Ability to describe self-care measures that may prevent or decrease complications (Diabetes Survival Skills Education) will improve Outcome: Adequate for Discharge Goal: Individualized Educational Video(s) Outcome: Adequate for Discharge   Problem: Coping: Goal: Ability to adjust to condition or change in health will improve Outcome: Adequate for Discharge   Problem: Fluid Volume: Goal: Ability to maintain a balanced intake and output will improve Outcome: Adequate for Discharge   Problem: Health Behavior/Discharge Planning: Goal: Ability to identify and utilize available resources and services will improve Outcome: Adequate for Discharge Goal: Ability to manage health-related needs will improve Outcome: Adequate for Discharge   Problem: Metabolic: Goal: Ability to maintain appropriate glucose levels will improve Outcome: Adequate for Discharge   Problem: Nutritional: Goal: Maintenance of adequate nutrition will improve Outcome: Adequate for Discharge Goal: Progress toward achieving an optimal weight will improve Outcome: Adequate for Discharge   Problem: Skin Integrity: Goal: Risk for impaired skin integrity will decrease Outcome: Adequate for Discharge   Problem: Tissue Perfusion: Goal: Adequacy of tissue perfusion will improve Outcome: Adequate for Discharge   Problem: Education: Goal:  Knowledge of General Education information will improve Description: Including pain rating scale, medication(s)/side effects and non-pharmacologic comfort measures Outcome: Adequate for Discharge   Problem: Health Behavior/Discharge Planning: Goal: Ability to manage health-related needs will improve Outcome: Adequate for Discharge   Problem: Clinical Measurements: Goal: Ability to maintain clinical measurements within normal limits will improve Outcome: Adequate for Discharge Goal: Will remain free from infection Outcome: Adequate for Discharge Goal: Diagnostic test results will improve Outcome: Adequate for Discharge Goal: Respiratory complications will improve Outcome: Adequate for Discharge Goal: Cardiovascular complication will be avoided Outcome: Adequate for Discharge   Problem: Activity: Goal: Risk for activity intolerance will decrease Outcome: Adequate for Discharge   Problem: Nutrition: Goal: Adequate nutrition will be maintained Outcome: Adequate for Discharge   Problem: Coping: Goal: Level of anxiety will decrease Outcome: Adequate for Discharge   Problem: Elimination: Goal: Will not experience complications related to bowel motility Outcome: Adequate for Discharge Goal: Will not experience complications related to urinary retention Outcome: Adequate for Discharge   Problem: Pain Managment: Goal: General experience of comfort will improve Outcome: Adequate for Discharge   Problem: Safety: Goal: Ability to remain free from injury will improve Outcome: Adequate for Discharge   Problem: Skin Integrity: Goal: Risk for impaired skin integrity will decrease Outcome: Adequate for Discharge   Problem: Education: Goal: Ability to demonstrate management of disease process will improve Outcome: Adequate for Discharge Goal: Ability to verbalize understanding of medication therapies will improve Outcome: Adequate for Discharge Goal: Individualized Educational  Video(s) Outcome: Adequate for Discharge   Problem: Activity: Goal: Capacity to carry out activities will improve Outcome: Adequate for Discharge   Problem: Cardiac: Goal: Ability to achieve and maintain adequate cardiopulmonary perfusion will improve Outcome: Adequate for Discharge   Problem: Education: Goal: Knowledge of General Education information will improve Description: Including pain rating scale, medication(s)/side effects and non-pharmacologic comfort measures Outcome: Adequate for Discharge   Problem: Health Behavior/Discharge Planning: Goal: Ability to manage  health-related needs will improve Outcome: Adequate for Discharge   Problem: Clinical Measurements: Goal: Ability to maintain clinical measurements within normal limits will improve Outcome: Adequate for Discharge Goal: Will remain free from infection Outcome: Adequate for Discharge Goal: Diagnostic test results will improve Outcome: Adequate for Discharge Goal: Respiratory complications will improve Outcome: Adequate for Discharge Goal: Cardiovascular complication will be avoided Outcome: Adequate for Discharge   Problem: Activity: Goal: Risk for activity intolerance will decrease Outcome: Adequate for Discharge   Problem: Nutrition: Goal: Adequate nutrition will be maintained Outcome: Adequate for Discharge   Problem: Coping: Goal: Level of anxiety will decrease Outcome: Adequate for Discharge

## 2022-06-25 DIAGNOSIS — I509 Heart failure, unspecified: Secondary | ICD-10-CM | POA: Diagnosis not present

## 2022-06-25 DIAGNOSIS — I130 Hypertensive heart and chronic kidney disease with heart failure and stage 1 through stage 4 chronic kidney disease, or unspecified chronic kidney disease: Secondary | ICD-10-CM | POA: Diagnosis not present

## 2022-06-25 DIAGNOSIS — S40922D Unspecified superficial injury of left upper arm, subsequent encounter: Secondary | ICD-10-CM | POA: Diagnosis not present

## 2022-06-25 DIAGNOSIS — Z4800 Encounter for change or removal of nonsurgical wound dressing: Secondary | ICD-10-CM | POA: Diagnosis not present

## 2022-06-25 DIAGNOSIS — E1122 Type 2 diabetes mellitus with diabetic chronic kidney disease: Secondary | ICD-10-CM | POA: Diagnosis not present

## 2022-06-25 DIAGNOSIS — I4891 Unspecified atrial fibrillation: Secondary | ICD-10-CM | POA: Diagnosis not present

## 2022-06-28 DIAGNOSIS — R300 Dysuria: Secondary | ICD-10-CM | POA: Diagnosis not present

## 2022-07-01 ENCOUNTER — Ambulatory Visit: Payer: Self-pay | Admitting: *Deleted

## 2022-07-01 DIAGNOSIS — B372 Candidiasis of skin and nail: Secondary | ICD-10-CM | POA: Diagnosis not present

## 2022-07-01 DIAGNOSIS — Z7984 Long term (current) use of oral hypoglycemic drugs: Secondary | ICD-10-CM | POA: Diagnosis not present

## 2022-07-01 DIAGNOSIS — M545 Low back pain, unspecified: Secondary | ICD-10-CM | POA: Diagnosis not present

## 2022-07-01 DIAGNOSIS — I071 Rheumatic tricuspid insufficiency: Secondary | ICD-10-CM | POA: Diagnosis not present

## 2022-07-01 DIAGNOSIS — I130 Hypertensive heart and chronic kidney disease with heart failure and stage 1 through stage 4 chronic kidney disease, or unspecified chronic kidney disease: Secondary | ICD-10-CM | POA: Diagnosis not present

## 2022-07-01 DIAGNOSIS — I4819 Other persistent atrial fibrillation: Secondary | ICD-10-CM | POA: Diagnosis not present

## 2022-07-01 DIAGNOSIS — I2510 Atherosclerotic heart disease of native coronary artery without angina pectoris: Secondary | ICD-10-CM | POA: Diagnosis not present

## 2022-07-01 DIAGNOSIS — S40922D Unspecified superficial injury of left upper arm, subsequent encounter: Secondary | ICD-10-CM | POA: Diagnosis not present

## 2022-07-01 DIAGNOSIS — I471 Supraventricular tachycardia, unspecified: Secondary | ICD-10-CM | POA: Diagnosis not present

## 2022-07-01 DIAGNOSIS — N1832 Chronic kidney disease, stage 3b: Secondary | ICD-10-CM | POA: Diagnosis not present

## 2022-07-01 DIAGNOSIS — E1122 Type 2 diabetes mellitus with diabetic chronic kidney disease: Secondary | ICD-10-CM | POA: Diagnosis not present

## 2022-07-01 DIAGNOSIS — E1165 Type 2 diabetes mellitus with hyperglycemia: Secondary | ICD-10-CM | POA: Diagnosis not present

## 2022-07-01 DIAGNOSIS — Z951 Presence of aortocoronary bypass graft: Secondary | ICD-10-CM | POA: Diagnosis not present

## 2022-07-01 DIAGNOSIS — E039 Hypothyroidism, unspecified: Secondary | ICD-10-CM | POA: Diagnosis not present

## 2022-07-01 DIAGNOSIS — E1151 Type 2 diabetes mellitus with diabetic peripheral angiopathy without gangrene: Secondary | ICD-10-CM | POA: Diagnosis not present

## 2022-07-01 DIAGNOSIS — R5381 Other malaise: Secondary | ICD-10-CM | POA: Diagnosis not present

## 2022-07-01 DIAGNOSIS — J9601 Acute respiratory failure with hypoxia: Secondary | ICD-10-CM | POA: Diagnosis not present

## 2022-07-01 DIAGNOSIS — I5043 Acute on chronic combined systolic (congestive) and diastolic (congestive) heart failure: Secondary | ICD-10-CM | POA: Diagnosis not present

## 2022-07-01 NOTE — Patient Outreach (Signed)
  Care Coordination   07/01/2022 Name: Jedadiah Abdallah MRN: [id number] DOB: 08/06/1922   Care Coordination Outreach Attempts:  An unsuccessful telephone outreach was attempted for a scheduled appointment today.  Follow Up Plan:  Additional outreach attempts will be made to offer the patient care coordination information and services.   Encounter Outcome:  No Answer  Care Coordination Interventions Activated:  No   Care Coordination Interventions:  No, not indicated    Valente David, RN, MSN, [facility] Care Management Care Management Coordinator [phone]

## 2022-07-05 DIAGNOSIS — I4891 Unspecified atrial fibrillation: Secondary | ICD-10-CM | POA: Diagnosis not present

## 2022-07-05 DIAGNOSIS — I509 Heart failure, unspecified: Secondary | ICD-10-CM | POA: Diagnosis not present

## 2022-07-05 DIAGNOSIS — T148XXA Other injury of unspecified body region, initial encounter: Secondary | ICD-10-CM | POA: Diagnosis not present

## 2022-07-05 DIAGNOSIS — N1832 Chronic kidney disease, stage 3b: Secondary | ICD-10-CM | POA: Diagnosis not present

## 2022-07-08 DIAGNOSIS — I5043 Acute on chronic combined systolic (congestive) and diastolic (congestive) heart failure: Secondary | ICD-10-CM | POA: Diagnosis not present

## 2022-07-08 DIAGNOSIS — S40922D Unspecified superficial injury of left upper arm, subsequent encounter: Secondary | ICD-10-CM | POA: Diagnosis not present

## 2022-07-08 DIAGNOSIS — N1832 Chronic kidney disease, stage 3b: Secondary | ICD-10-CM | POA: Diagnosis not present

## 2022-07-08 DIAGNOSIS — I130 Hypertensive heart and chronic kidney disease with heart failure and stage 1 through stage 4 chronic kidney disease, or unspecified chronic kidney disease: Secondary | ICD-10-CM | POA: Diagnosis not present

## 2022-07-08 DIAGNOSIS — J9601 Acute respiratory failure with hypoxia: Secondary | ICD-10-CM | POA: Diagnosis not present

## 2022-07-08 DIAGNOSIS — E1122 Type 2 diabetes mellitus with diabetic chronic kidney disease: Secondary | ICD-10-CM | POA: Diagnosis not present

## 2022-07-08 NOTE — Progress Notes (Signed)
Cardiology Office Note:    Date:  07/09/2022   ID:  Franklin Gonzalez, DOB 31-May-1922, MRN [id number]  PCP:  Donetta Potts, MD    [facility] Providers Cardiologist:  Olga Millers, MD     Referring MD: Donetta Potts, MD   Chief Complaint  Patient presents with   Follow-up    Remote CABG    History of Present Illness:    Franklin Gonzalez is a 88 y.o. male with a hx of CAD s/p CABG 1993 at [facility], NSTEMI 09/2012 treated with PCI/DES to SVG-RCA and DES-SVG G-OM, mild cardiomyopathy with an LVEF 45-50%, SVT in the setting of NSTEMI, carotid artery stenosis, hypertension, hyperlipidemia, DM 2.  He is maintained on amiodarone 100 mg daily for SVT.  LVEF was noted at 25% in 06/2020. Cardiomyopathy has been treated conservatively with losartan and torsemide, no additional ischemic evaluation given his age.  He did not tolerate Entresto and has a history of bradycardia.    He was recently hospitalized at [facility] 06/2022 for acute on chronic combined systolic and diastolic heart failure.  He underwent diuresis being mindful of impaired renal function.  SGLT2i was needed given skin breakdown.  12.5 mg spironolactone was added and AKI improved with diuresis.  Echocardiogram that admission confirmed an LVEF 20-25%, severely reduced RV function with PASP 47 mmHg, severe biatrial enlargement, trivial AI.  He presents today for cardiology follow-up of his hospitalization.  Dry weight is 147-149 lbs. Weight prior to hospitalization was 159 lbs. Overall he is doing quite well for his medical issues. His main complaint is knee and ankle joint pain.   He is taking torsemide 20 mg daily, 5 mEq potassium, and 12.5 mg spironolactone. He previously did not tolerate spiro secondary to hyperkalemia. I will check labs today. May be able to stop potassium. He is planning to visit the beach. On days that he skips torsemide, I recommend increasing to 1.5 tablets (30 mg torsemide) for at least  2 days and monitor weight with home scales.  Past Medical History:  Diagnosis Date   Bifascicular block    Coronary artery disease    CABG 1993 at [facility]; NSTEMI 09/2012 s/p DES to SVG-OM1, DES to SVG-PDA    DM (diabetes mellitus) (HCC)    HTN (hypertension)    SVT (supraventricular tachycardia)     Past Surgical History:  Procedure Laterality Date   CARDIAC SURGERY     1993 CABG   LEFT HEART CATHETERIZATION WITH CORONARY/GRAFT ANGIOGRAM N/A 10/04/2012   Procedure: LEFT HEART CATHETERIZATION WITH Isabel Caprice;  Surgeon: Tonny Bollman, MD;  Location: [facility] CATH LAB;  Service: Cardiovascular;  Laterality: N/A;    Current Medications: No outpatient medications have been marked as taking for the 07/09/22 encounter (Office Visit) with Marcelino Duster, PA.     Allergies:   Patient has no known allergies.   Social History   Socioeconomic History   Marital status: Widowed    Spouse name: Not on file   Number of children: Not on file   Years of education: college- BS   Highest education level: Bachelor's degree (e.g., BA, AB, BS)  Occupational History   Occupation: retired    Comment: was in [employer]  Tobacco Use   Smoking status: Never   Smokeless tobacco: Never  Vaping Use   Vaping Use: Never used  Substance and Sexual Activity   Alcohol use: No    Alcohol/week: 0.0 standard drinks of alcohol   Drug  use: No   Sexual activity: Not on file  Other Topics Concern   Not on file  Social History Narrative   wife passed in January 2021   son Iona Beard is the primary care giver prn   Retired from [employer] with a BS degree   Social Determinants of Health   Financial Resource Strain: Low Risk  (07/25/2020)   Overall Financial Resource Strain (CARDIA)    Difficulty of Paying Living Expenses: Not hard at all  Food Insecurity: No Food Insecurity (06/17/2022)   Hunger Vital Sign    Worried About Running Out of Food in the Last Year: Never true    [place] in the Last Year: Never true  Transportation Needs: No Transportation Needs (06/17/2022)   PRAPARE - Hydrologist (Medical): No    Lack of Transportation (Non-Medical): No  Physical Activity: Not on file  Stress: No Stress Concern Present (07/25/2020)   [place]    Feeling of Stress : Not at all  Social Connections: Moderately Integrated (07/25/2020)   Social Connection and Isolation Panel [NHANES]    Frequency of Communication with Friends and Family: More than three times a week    Frequency of Social Gatherings with Friends and Family: More than three times a week    Attends Religious Services: 1 to 4 times per year    Active Member of [employer] or Organizations: Yes    Attends Archivist Meetings: 1 to 4 times per year    Marital Status: Widowed     Family History: The patient's family history includes CAD in an other family member.  ROS:   Please see the history of present illness.     All other systems reviewed and are negative.  EKGs/Labs/Other Studies Reviewed:    The following studies were reviewed today:  Echo 06/2022: 1. Left ventricular ejection fraction, by estimation, is 20 to 25%. The  left ventricle has severely decreased function. The left ventricle  demonstrates global hypokinesis. The left ventricular internal cavity size  was mildly dilated. Left ventricular  diastolic parameters are indeterminate.   2. Right ventricular systolic function is severely reduced. The right  ventricular size is normal. There is moderately elevated pulmonary artery  systolic pressure. The estimated right ventricular systolic pressure is  40.9 mmHg.   3. Left atrial size was severely dilated.   4. Right atrial size was severely dilated.   5. The mitral valve is normal in structure. Trivial mitral valve  regurgitation. No evidence of mitral stenosis.   6. Tricuspid valve  regurgitation is moderate to severe.   7. The aortic valve was not well visualized. Aortic valve regurgitation  is trivial. Aortic valve sclerosis/calcification is present, without any  evidence of aortic stenosis.   8. The inferior vena cava is dilated in size with <50% respiratory  variability, suggesting right atrial pressure of 15 mmHg.   EKG:  EKG is not ordered today.    Recent Labs: 06/16/2022: TSH 1.803 06/18/2022: ALT 30 06/20/2022: B Natriuretic Peptide 1,874.0; BUN 40; Creatinine, Ser 1.60; Hemoglobin 11.9; Magnesium 2.3; Platelets 113; Potassium 3.7; Sodium 137  Recent Lipid Panel    Component Value Date/Time   CHOL 132 04/14/2018 0844   TRIG 68 04/14/2018 0844   HDL 66 04/14/2018 0844   CHOLHDL 2.0 04/14/2018 0844   CHOLHDL 2.2 02/14/2015 0905   VLDL 7 02/14/2015 0905   LDLCALC 52 04/14/2018  7262     Risk Assessment/Calculations:                Physical Exam:    VS:  BP 118/62   Ht 5\' 11"  (1.803 m)   Wt 149 lb 12.8 oz (67.9 kg)   BMI 20.89 kg/m     Wt Readings from Last 3 Encounters:  07/09/22 149 lb 12.8 oz (67.9 kg)  06/20/22 159 lb 6.3 oz (72.3 kg)  05/12/22 147 lb 3.2 oz (66.8 kg)     GEN:  Well nourished, well developed in no acute distress HEENT: Normal NECK: No JVD; No carotid bruits LYMPHATICS: No lymphadenopathy CARDIAC: RRR, no murmurs, rubs, gallops RESPIRATORY:  occasional crackle in right base ABDOMEN: Soft, non-tender, non-distended MUSCULOSKELETAL:  mild edema, chronic skin changes SKIN: Warm and dry NEUROLOGIC:  Alert and oriented x 3 PSYCHIATRIC:  Normal affect   ASSESSMENT:    1. Chronic systolic congestive heart failure (HCC)   2. CAD S/P percutaneous coronary angioplasty   3. Hx of CABG   4. Primary hypertension   5. SVT (supraventricular tachycardia)   6. Pure hypercholesterolemia    PLAN:    In order of problems listed above:  Chronic biventricular heart failure LVEF 20-25%, severely reduced RV function with  elevated PASP Severe biatrial enlargement GDMT: 12.5 mg spironolactone, 20 mg torsemide, 50 mg losartan, 5 mEq potassium Will need BMP today No SGLT2i given skin breakdown   CAD s/p CABG, subsequent PCI Hyperlipidemia with LDL goal less than 70 No further ischemic evaluation given age Continue aspirin, 10 mg Lipitor I do not feel strongly about repeating a lipid panel in this patient   Hypertension Spironolactone, torsemide, losartan, and Cardura   SVT Managed with 100 mg amiodarone   I think he is doing quite well. His son is very much on top of his medical needs/medications.    Follow up in 1 month.  Medication Adjustments/Labs and Tests Ordered: Current medicines are reviewed at length with the patient today.  Concerns regarding medicines are outlined above.  Orders Placed This Encounter  Procedures   Pro b natriuretic peptide (BNP)   Basic metabolic panel   Meds ordered this encounter  Medications   potassium chloride (KLOR-CON M) 10 MEQ tablet    Sig: Take 0.5 tablets (5 mEq total) by mouth daily.    Dispense:  90 tablet    Refill:  1    Patient Instructions    Medication Instructions:IF SKIPPING DOSE OF TORSEMIDE FOR THE NEXT COUPLE OF DAYS TAKE 1 1/2 TABS  *If you need a refill on your cardiac medications before your next appointment, please call your pharmacy*   Lab Work: TODAY BMET AND BNP If you have labs (blood work) drawn today and your tests are completely normal, you will receive your results only by: MyChart Message (if you have MyChart) OR A paper copy in the mail If you have any lab test that is abnormal or we need to change your treatment, we will call you to review the results.   Testing/Procedures: NONE   Follow-Up: At [facility], you and your health needs are our priority.  As part of our continuing mission to provide you with exceptional heart care, we have created designated Provider Care Teams.  These Care Teams include  your primary Cardiologist (physician) and Advanced Practice Providers (APPs -  Physician Assistants and Nurse Practitioners) who all work together to provide you with the care you need, when you need it.  We recommend signing up for the patient portal called "MyChart".  Sign up information is provided on this After Visit Summary.  MyChart is used to connect with patients for Virtual Visits (Telemedicine).  Patients are able to view lab/test results, encounter notes, upcoming appointments, etc.  Non-urgent messages can be sent to your provider as well.   To learn more about what you can do with MyChart, go to [email or web address].    Your next appointment:   1 month(s)  The format for your next appointment:   In Person  Provider:   Olga Millers, MD [facility] Henzler PA  Other Instructions NONE  Important Information About Sugar         Signed, Marcelino Duster, [place]  07/09/2022 3:04 PM    [facility]

## 2022-07-09 ENCOUNTER — Encounter: Payer: Self-pay | Admitting: Physician Assistant

## 2022-07-09 ENCOUNTER — Ambulatory Visit: Payer: Medicare Other | Attending: Physician Assistant | Admitting: Physician Assistant

## 2022-07-09 VITALS — BP 118/62 | Ht 71.0 in | Wt 149.8 lb

## 2022-07-09 DIAGNOSIS — I2510 Atherosclerotic heart disease of native coronary artery without angina pectoris: Secondary | ICD-10-CM | POA: Insufficient documentation

## 2022-07-09 DIAGNOSIS — I10 Essential (primary) hypertension: Secondary | ICD-10-CM | POA: Diagnosis not present

## 2022-07-09 DIAGNOSIS — Z9861 Coronary angioplasty status: Secondary | ICD-10-CM | POA: Diagnosis not present

## 2022-07-09 DIAGNOSIS — E7800 Pure hypercholesterolemia, unspecified: Secondary | ICD-10-CM | POA: Diagnosis not present

## 2022-07-09 DIAGNOSIS — Z951 Presence of aortocoronary bypass graft: Secondary | ICD-10-CM | POA: Diagnosis not present

## 2022-07-09 DIAGNOSIS — I5022 Chronic systolic (congestive) heart failure: Secondary | ICD-10-CM | POA: Insufficient documentation

## 2022-07-09 DIAGNOSIS — I471 Supraventricular tachycardia, unspecified: Secondary | ICD-10-CM | POA: Insufficient documentation

## 2022-07-09 MED ORDER — POTASSIUM CHLORIDE CRYS ER 10 MEQ PO TBCR: 5.0000 meq | EXTENDED_RELEASE_TABLET | Freq: Every day | ORAL | 1 refills | Status: DC

## 2022-07-09 NOTE — Patient Instructions (Signed)
   Medication Instructions:IF SKIPPING DOSE OF TORSEMIDE FOR THE NEXT COUPLE OF DAYS TAKE 1 1/2 TABS  *If you need a refill on your cardiac medications before your next appointment, please call your pharmacy*   Lab Work: TODAY BMET AND BNP If you have labs (blood work) drawn today and your tests are completely normal, you will receive your results only by: [place] (if you have MyChart) OR A paper copy in the mail If you have any lab test that is abnormal or we need to change your treatment, we will call you to review the results.   Testing/Procedures: NONE   Follow-Up: At [facility], you and your health needs are our priority.  As part of our continuing mission to provide you with exceptional heart care, we have created designated Provider Care Teams.  These Care Teams include your primary Cardiologist (physician) and Advanced Practice Providers (APPs -  Physician Assistants and Nurse Practitioners) who all work together to provide you with the care you need, when you need it.  We recommend signing up for the patient portal called "MyChart".  Sign up information is provided on this After Visit Summary.  MyChart is used to connect with patients for Virtual Visits (Telemedicine).  Patients are able to view lab/test results, encounter notes, upcoming appointments, etc.  Non-urgent messages can be sent to your provider as well.   To learn more about what you can do with MyChart, go to [email or web address].    Your next appointment:   1 month(s)  The format for your next appointment:   In Person  Provider:   Kirk Ruths, MD [facility] PA  Other Instructions NONE  Important Information About Sugar

## 2022-07-10 ENCOUNTER — Other Ambulatory Visit: Payer: Self-pay | Admitting: Cardiology

## 2022-07-10 LAB — BRAIN NATRIURETIC PEPTIDE: BNP: 2115.6 pg/mL — ABNORMAL HIGH (ref 0.0–100.0)

## 2022-07-12 ENCOUNTER — Telehealth: Payer: Self-pay | Admitting: Physician Assistant

## 2022-07-12 DIAGNOSIS — I5022 Chronic systolic (congestive) heart failure: Secondary | ICD-10-CM

## 2022-07-12 NOTE — Telephone Encounter (Signed)
Patient's son was returning call about results

## 2022-07-12 NOTE — Telephone Encounter (Signed)
Tami Lin [place], [place] 07/12/2022  8:25 AM EST     Your labs show that you have too much fluid. Unfortunately, one lab has not resulted. I will see if we can add this on to your labs on Friday.   Franklin Gonzalez - I'm not sure why his BMP wasn't collected with the BNP. Can you please ask Sasha if we can get this lab added on? He is on spironolactone with a history of hyperkalemia. If we can't add it on, can you please help him get a lab draw closer to his home?   Spoke to [employer] needs to be redrawn.   Son requested this be drawn at [facility] in [place].  Called [facility]-must send note to provider to get approval.    Faxed lab order: [phone] attn: Heidi   If provider says okay-they will schedule him for lab drawn.  She will call to let me know.      Called son-left message to make aware.

## 2022-07-13 DIAGNOSIS — I5043 Acute on chronic combined systolic (congestive) and diastolic (congestive) heart failure: Secondary | ICD-10-CM | POA: Diagnosis not present

## 2022-07-13 DIAGNOSIS — J9601 Acute respiratory failure with hypoxia: Secondary | ICD-10-CM | POA: Diagnosis not present

## 2022-07-13 DIAGNOSIS — S40922D Unspecified superficial injury of left upper arm, subsequent encounter: Secondary | ICD-10-CM | POA: Diagnosis not present

## 2022-07-13 DIAGNOSIS — E1122 Type 2 diabetes mellitus with diabetic chronic kidney disease: Secondary | ICD-10-CM | POA: Diagnosis not present

## 2022-07-13 DIAGNOSIS — I130 Hypertensive heart and chronic kidney disease with heart failure and stage 1 through stage 4 chronic kidney disease, or unspecified chronic kidney disease: Secondary | ICD-10-CM | POA: Diagnosis not present

## 2022-07-13 DIAGNOSIS — N1832 Chronic kidney disease, stage 3b: Secondary | ICD-10-CM | POA: Diagnosis not present

## 2022-07-13 NOTE — Telephone Encounter (Signed)
Called [facility] to follow up-Lab scheduled tomorrow 815 AM   Results will be faxed to office.

## 2022-07-14 DIAGNOSIS — N1832 Chronic kidney disease, stage 3b: Secondary | ICD-10-CM | POA: Diagnosis not present

## 2022-07-14 DIAGNOSIS — I509 Heart failure, unspecified: Secondary | ICD-10-CM | POA: Diagnosis not present

## 2022-07-15 DIAGNOSIS — M79671 Pain in right foot: Secondary | ICD-10-CM | POA: Diagnosis not present

## 2022-07-15 DIAGNOSIS — M79672 Pain in left foot: Secondary | ICD-10-CM | POA: Diagnosis not present

## 2022-07-15 DIAGNOSIS — I5043 Acute on chronic combined systolic (congestive) and diastolic (congestive) heart failure: Secondary | ICD-10-CM | POA: Diagnosis not present

## 2022-07-15 DIAGNOSIS — N1832 Chronic kidney disease, stage 3b: Secondary | ICD-10-CM | POA: Diagnosis not present

## 2022-07-15 DIAGNOSIS — L609 Nail disorder, unspecified: Secondary | ICD-10-CM | POA: Diagnosis not present

## 2022-07-15 DIAGNOSIS — J9601 Acute respiratory failure with hypoxia: Secondary | ICD-10-CM | POA: Diagnosis not present

## 2022-07-15 DIAGNOSIS — E1122 Type 2 diabetes mellitus with diabetic chronic kidney disease: Secondary | ICD-10-CM | POA: Diagnosis not present

## 2022-07-15 DIAGNOSIS — E1151 Type 2 diabetes mellitus with diabetic peripheral angiopathy without gangrene: Secondary | ICD-10-CM | POA: Diagnosis not present

## 2022-07-15 DIAGNOSIS — M79674 Pain in right toe(s): Secondary | ICD-10-CM | POA: Diagnosis not present

## 2022-07-15 DIAGNOSIS — M79675 Pain in left toe(s): Secondary | ICD-10-CM | POA: Diagnosis not present

## 2022-07-15 DIAGNOSIS — I130 Hypertensive heart and chronic kidney disease with heart failure and stage 1 through stage 4 chronic kidney disease, or unspecified chronic kidney disease: Secondary | ICD-10-CM | POA: Diagnosis not present

## 2022-07-15 DIAGNOSIS — S40922D Unspecified superficial injury of left upper arm, subsequent encounter: Secondary | ICD-10-CM | POA: Diagnosis not present

## 2022-07-19 DIAGNOSIS — S40922D Unspecified superficial injury of left upper arm, subsequent encounter: Secondary | ICD-10-CM | POA: Diagnosis not present

## 2022-07-19 DIAGNOSIS — E1122 Type 2 diabetes mellitus with diabetic chronic kidney disease: Secondary | ICD-10-CM | POA: Diagnosis not present

## 2022-07-19 DIAGNOSIS — N189 Chronic kidney disease, unspecified: Secondary | ICD-10-CM | POA: Diagnosis not present

## 2022-07-19 DIAGNOSIS — I509 Heart failure, unspecified: Secondary | ICD-10-CM | POA: Diagnosis not present

## 2022-07-19 DIAGNOSIS — I4891 Unspecified atrial fibrillation: Secondary | ICD-10-CM | POA: Diagnosis not present

## 2022-07-19 DIAGNOSIS — I130 Hypertensive heart and chronic kidney disease with heart failure and stage 1 through stage 4 chronic kidney disease, or unspecified chronic kidney disease: Secondary | ICD-10-CM | POA: Diagnosis not present

## 2022-07-19 DIAGNOSIS — M545 Low back pain, unspecified: Secondary | ICD-10-CM | POA: Diagnosis not present

## 2022-07-19 DIAGNOSIS — Z4800 Encounter for change or removal of nonsurgical wound dressing: Secondary | ICD-10-CM | POA: Diagnosis not present

## 2022-07-19 NOTE — Telephone Encounter (Signed)
Spoke to son-he states PCP told them to hold potassium for 2 days then take every other day.  He is leaving to go out of town on Sunday and will return the following Sunday.  He has labs scheduled for 11/27 at PCP for recheck.    He wanted to make sure Dr. Jens Som agrees with this recommendation.  He is taking torsemide 30 mg daily and patient is doing well on this.

## 2022-07-19 NOTE — Telephone Encounter (Signed)
Called [facility] to follow up on lab results-labs to be faxed

## 2022-07-20 NOTE — Telephone Encounter (Signed)
Lewayne Bunting, MD   07/19/22  6:14 PM  Agree with plan Olga Millers   Left detailed message (ok per DPR) to make aware.  Advised to call back with any questions or concerns.

## 2022-07-21 DIAGNOSIS — M1711 Unilateral primary osteoarthritis, right knee: Secondary | ICD-10-CM | POA: Diagnosis not present

## 2022-07-21 DIAGNOSIS — Z6827 Body mass index (BMI) 27.0-27.9, adult: Secondary | ICD-10-CM | POA: Diagnosis not present

## 2022-07-21 DIAGNOSIS — M1712 Unilateral primary osteoarthritis, left knee: Secondary | ICD-10-CM | POA: Diagnosis not present

## 2022-07-21 DIAGNOSIS — N1832 Chronic kidney disease, stage 3b: Secondary | ICD-10-CM | POA: Diagnosis not present

## 2022-07-21 DIAGNOSIS — R030 Elevated blood-pressure reading, without diagnosis of hypertension: Secondary | ICD-10-CM | POA: Diagnosis not present

## 2022-07-23 DIAGNOSIS — I5043 Acute on chronic combined systolic (congestive) and diastolic (congestive) heart failure: Secondary | ICD-10-CM | POA: Diagnosis not present

## 2022-07-23 DIAGNOSIS — N1832 Chronic kidney disease, stage 3b: Secondary | ICD-10-CM | POA: Diagnosis not present

## 2022-07-23 DIAGNOSIS — S40922D Unspecified superficial injury of left upper arm, subsequent encounter: Secondary | ICD-10-CM | POA: Diagnosis not present

## 2022-07-23 DIAGNOSIS — J9601 Acute respiratory failure with hypoxia: Secondary | ICD-10-CM | POA: Diagnosis not present

## 2022-07-23 DIAGNOSIS — I130 Hypertensive heart and chronic kidney disease with heart failure and stage 1 through stage 4 chronic kidney disease, or unspecified chronic kidney disease: Secondary | ICD-10-CM | POA: Diagnosis not present

## 2022-07-23 DIAGNOSIS — E1122 Type 2 diabetes mellitus with diabetic chronic kidney disease: Secondary | ICD-10-CM | POA: Diagnosis not present

## 2022-07-31 DIAGNOSIS — M545 Low back pain, unspecified: Secondary | ICD-10-CM | POA: Diagnosis not present

## 2022-07-31 DIAGNOSIS — B372 Candidiasis of skin and nail: Secondary | ICD-10-CM | POA: Diagnosis not present

## 2022-07-31 DIAGNOSIS — N1832 Chronic kidney disease, stage 3b: Secondary | ICD-10-CM | POA: Diagnosis not present

## 2022-07-31 DIAGNOSIS — E1151 Type 2 diabetes mellitus with diabetic peripheral angiopathy without gangrene: Secondary | ICD-10-CM | POA: Diagnosis not present

## 2022-07-31 DIAGNOSIS — E1122 Type 2 diabetes mellitus with diabetic chronic kidney disease: Secondary | ICD-10-CM | POA: Diagnosis not present

## 2022-07-31 DIAGNOSIS — R5381 Other malaise: Secondary | ICD-10-CM | POA: Diagnosis not present

## 2022-07-31 DIAGNOSIS — Z7984 Long term (current) use of oral hypoglycemic drugs: Secondary | ICD-10-CM | POA: Diagnosis not present

## 2022-07-31 DIAGNOSIS — I130 Hypertensive heart and chronic kidney disease with heart failure and stage 1 through stage 4 chronic kidney disease, or unspecified chronic kidney disease: Secondary | ICD-10-CM | POA: Diagnosis not present

## 2022-07-31 DIAGNOSIS — J9601 Acute respiratory failure with hypoxia: Secondary | ICD-10-CM | POA: Diagnosis not present

## 2022-07-31 DIAGNOSIS — I4819 Other persistent atrial fibrillation: Secondary | ICD-10-CM | POA: Diagnosis not present

## 2022-07-31 DIAGNOSIS — I5043 Acute on chronic combined systolic (congestive) and diastolic (congestive) heart failure: Secondary | ICD-10-CM | POA: Diagnosis not present

## 2022-07-31 DIAGNOSIS — E1165 Type 2 diabetes mellitus with hyperglycemia: Secondary | ICD-10-CM | POA: Diagnosis not present

## 2022-07-31 DIAGNOSIS — I2510 Atherosclerotic heart disease of native coronary artery without angina pectoris: Secondary | ICD-10-CM | POA: Diagnosis not present

## 2022-07-31 DIAGNOSIS — Z951 Presence of aortocoronary bypass graft: Secondary | ICD-10-CM | POA: Diagnosis not present

## 2022-07-31 DIAGNOSIS — I071 Rheumatic tricuspid insufficiency: Secondary | ICD-10-CM | POA: Diagnosis not present

## 2022-07-31 DIAGNOSIS — E039 Hypothyroidism, unspecified: Secondary | ICD-10-CM | POA: Diagnosis not present

## 2022-07-31 DIAGNOSIS — I471 Supraventricular tachycardia, unspecified: Secondary | ICD-10-CM | POA: Diagnosis not present

## 2022-08-02 DIAGNOSIS — N1832 Chronic kidney disease, stage 3b: Secondary | ICD-10-CM | POA: Diagnosis not present

## 2022-08-02 DIAGNOSIS — E1140 Type 2 diabetes mellitus with diabetic neuropathy, unspecified: Secondary | ICD-10-CM | POA: Diagnosis not present

## 2022-08-02 DIAGNOSIS — I509 Heart failure, unspecified: Secondary | ICD-10-CM | POA: Diagnosis not present

## 2022-08-03 ENCOUNTER — Telehealth: Payer: Self-pay | Admitting: *Deleted

## 2022-08-03 DIAGNOSIS — E1165 Type 2 diabetes mellitus with hyperglycemia: Secondary | ICD-10-CM | POA: Diagnosis not present

## 2022-08-03 DIAGNOSIS — I130 Hypertensive heart and chronic kidney disease with heart failure and stage 1 through stage 4 chronic kidney disease, or unspecified chronic kidney disease: Secondary | ICD-10-CM | POA: Diagnosis not present

## 2022-08-03 DIAGNOSIS — N1832 Chronic kidney disease, stage 3b: Secondary | ICD-10-CM | POA: Diagnosis not present

## 2022-08-03 DIAGNOSIS — E1122 Type 2 diabetes mellitus with diabetic chronic kidney disease: Secondary | ICD-10-CM | POA: Diagnosis not present

## 2022-08-03 DIAGNOSIS — J9601 Acute respiratory failure with hypoxia: Secondary | ICD-10-CM | POA: Diagnosis not present

## 2022-08-03 DIAGNOSIS — I5043 Acute on chronic combined systolic (congestive) and diastolic (congestive) heart failure: Secondary | ICD-10-CM | POA: Diagnosis not present

## 2022-08-03 NOTE — Progress Notes (Signed)
  Care Coordination Note  08/03/2022 Name: [name] MRN: [id number] DOB: October 04, 1921  Franklin Gonzalez is a 88 y.o. year old male who is a primary care patient of Donetta Potts, MD and is actively engaged with the care management team. I reached out to Lavell Islam by phone today to assist with scheduling a follow up visit with the RN Case Manager  Follow up plan: Unsuccessful telephone outreach attempt made. A HIPAA compliant phone message was left for the patient providing contact information and requesting a return call.    [facility]  Care Coordination Care Guide  Direct Dial: [phone]

## 2022-08-04 DIAGNOSIS — I10 Essential (primary) hypertension: Secondary | ICD-10-CM | POA: Diagnosis not present

## 2022-08-04 DIAGNOSIS — I509 Heart failure, unspecified: Secondary | ICD-10-CM | POA: Diagnosis not present

## 2022-08-04 DIAGNOSIS — E1140 Type 2 diabetes mellitus with diabetic neuropathy, unspecified: Secondary | ICD-10-CM | POA: Diagnosis not present

## 2022-08-04 DIAGNOSIS — N189 Chronic kidney disease, unspecified: Secondary | ICD-10-CM | POA: Diagnosis not present

## 2022-08-05 DIAGNOSIS — Z6826 Body mass index (BMI) 26.0-26.9, adult: Secondary | ICD-10-CM | POA: Diagnosis not present

## 2022-08-05 DIAGNOSIS — N189 Chronic kidney disease, unspecified: Secondary | ICD-10-CM | POA: Diagnosis not present

## 2022-08-05 DIAGNOSIS — I4891 Unspecified atrial fibrillation: Secondary | ICD-10-CM | POA: Diagnosis not present

## 2022-08-05 DIAGNOSIS — N1832 Chronic kidney disease, stage 3b: Secondary | ICD-10-CM | POA: Diagnosis not present

## 2022-08-05 DIAGNOSIS — I10 Essential (primary) hypertension: Secondary | ICD-10-CM | POA: Diagnosis not present

## 2022-08-05 DIAGNOSIS — I509 Heart failure, unspecified: Secondary | ICD-10-CM | POA: Diagnosis not present

## 2022-08-05 DIAGNOSIS — E1140 Type 2 diabetes mellitus with diabetic neuropathy, unspecified: Secondary | ICD-10-CM | POA: Diagnosis not present

## 2022-08-05 DIAGNOSIS — R030 Elevated blood-pressure reading, without diagnosis of hypertension: Secondary | ICD-10-CM | POA: Diagnosis not present

## 2022-08-09 DIAGNOSIS — E1165 Type 2 diabetes mellitus with hyperglycemia: Secondary | ICD-10-CM | POA: Diagnosis not present

## 2022-08-09 DIAGNOSIS — E1122 Type 2 diabetes mellitus with diabetic chronic kidney disease: Secondary | ICD-10-CM | POA: Diagnosis not present

## 2022-08-09 DIAGNOSIS — N1832 Chronic kidney disease, stage 3b: Secondary | ICD-10-CM | POA: Diagnosis not present

## 2022-08-09 DIAGNOSIS — I130 Hypertensive heart and chronic kidney disease with heart failure and stage 1 through stage 4 chronic kidney disease, or unspecified chronic kidney disease: Secondary | ICD-10-CM | POA: Diagnosis not present

## 2022-08-09 DIAGNOSIS — I5043 Acute on chronic combined systolic (congestive) and diastolic (congestive) heart failure: Secondary | ICD-10-CM | POA: Diagnosis not present

## 2022-08-09 DIAGNOSIS — J9601 Acute respiratory failure with hypoxia: Secondary | ICD-10-CM | POA: Diagnosis not present

## 2022-08-10 NOTE — Progress Notes (Signed)
Cardiology Office Note:    Date:  08/16/2022   ID:  Franklin Gonzalez, DOB 1921/10/10, MRN [id number]  PCP:  Donetta Potts, MD   [facility] Providers Cardiologist:  Olga Millers, MD Cardiology APP:  Marcelino Duster, [place] { Referring MD: Donetta Potts, MD   Chief Complaint  Patient presents with   Follow-up  CHF  History of Present Illness:    Franklin Gonzalez is a 88 y.o. male with a hx of CAD s/p CABG 1993 at [facility], NSTEMI 09/2012 treated with PCI/DES to SVG-RCA and DES-SVG G-OM, mild cardiomyopathy with an LVEF 45-50%, SVT in the setting of NSTEMI, carotid artery stenosis, hypertension, hyperlipidemia, DM 2.  He is maintained on amiodarone 100 mg daily for SVT.  LVEF was noted at 25% in 06/2020. Cardiomyopathy has been treated conservatively with losartan and torsemide, no additional ischemic evaluation given his age.  He did not tolerate Entresto and has a history of bradycardia.    He was recently hospitalized at [facility] 06/2022 for acute on chronic combined systolic and diastolic heart failure.  He underwent diuresis being mindful of impaired renal function.  SGLT2i was needed given skin breakdown.  12.5 mg spironolactone was added and AKI improved with diuresis.  Echocardiogram that admission confirmed an LVEF 20-25%, severely reduced RV function with PASP 47 mmHg, severe biatrial enlargement, trivial AI.  He presents today for cardiology follow-up of his hospitalization. I saw him on 07/09/22 and he was doing well on torsemide 20 mg daily and potassium along with 12.5 mg spironolactone. He previously did not tolerate spironolactone secondary to hyperkalemia. Given this, I rechecked labs and found he had an elevated BNP of 2116  07/19/22:  sCr 1.63 and K 5.4 PCP instructed to hold potassium for 2 days and then take every other day.  Labs rechecked on 11/27  I had instructed on days he holds torsemide, he should take 1.5 tablets the following  day. Luckily, he was able to make it to the beach.   He returns today for follow up. He is now taking torsemide every other day along with half of potassium. He lost too much weight on daily torsemide. They had a great time at the beach. Legs swollen today. I recommended compression socks.   Past Medical History:  Diagnosis Date   Bifascicular block    Coronary artery disease    CABG 1993 at [facility]; NSTEMI 09/2012 s/p DES to SVG-OM1, DES to SVG-PDA    DM (diabetes mellitus) (HCC)    HTN (hypertension)    SVT (supraventricular tachycardia)     Past Surgical History:  Procedure Laterality Date   CARDIAC SURGERY     1993 CABG   LEFT HEART CATHETERIZATION WITH CORONARY/GRAFT ANGIOGRAM N/A 10/04/2012   Procedure: LEFT HEART CATHETERIZATION WITH Isabel Caprice;  Surgeon: Tonny Bollman, MD;  Location: [facility] CATH LAB;  Service: Cardiovascular;  Laterality: N/A;    Current Medications: Current Meds  Medication Sig   amiodarone (PACERONE) 200 MG tablet Take 1/2 (one-half) tablet by mouth once daily   ammonium lactate (AMLACTIN) 12 % cream Apply topically 2 (two) times daily.   aspirin EC 81 MG EC tablet Take 1 tablet (81 mg total) by mouth daily.   atorvastatin (LIPITOR) 10 MG tablet Take 10 mg by mouth at bedtime.   dorzolamide-timolol (COSOPT) 22.3-6.8 MG/ML ophthalmic solution Place 1 drop into both eyes 2 (two) times daily.   doxazosin (CARDURA) 4 MG tablet Take 2 mg by mouth  at bedtime.   EUTHYROX 75 MCG tablet Take 75 mcg by mouth daily.   glipiZIDE (GLUCOTROL) 5 MG tablet Take 2.5 mg by mouth daily.   latanoprost (XALATAN) 0.005 % ophthalmic solution Place 1 drop into both eyes every evening.   levothyroxine (SYNTHROID) 88 MCG tablet Take 88 mcg by mouth daily.   losartan (COZAAR) 50 MG tablet Take 1 tablet by mouth once daily   nitroGLYCERIN (NITROSTAT) 0.4 MG SL tablet Place 1 tablet (0.4 mg total) under the tongue every 5 (five) minutes x 3 doses as needed for  chest pain.   potassium chloride (KLOR-CON M) 10 MEQ tablet Take 0.5 tablets (5 mEq total) by mouth daily.   spironolactone (ALDACTONE) 25 MG tablet Take 0.5 tablets (12.5 mg total) by mouth daily.   torsemide (DEMADEX) 20 MG tablet Take 1 tablet (20 mg total) by mouth daily. Take one tablet every other day     Allergies:   Patient has no known allergies.   Social History   Socioeconomic History   Marital status: Widowed    Spouse name: Not on file   Number of children: Not on file   Years of education: college- BS   Highest education level: Bachelor's degree (e.g., BA, AB, BS)  Occupational History   Occupation: retired    Comment: was in [employer]  Tobacco Use   Smoking status: Never   Smokeless tobacco: Never  Vaping Use   Vaping Use: Never used  Substance and Sexual Activity   Alcohol use: No    Alcohol/week: 0.0 standard drinks of alcohol   Drug use: No   Sexual activity: Not on file  Other Topics Concern   Not on file  Social History Narrative   wife passed in January 2021   son [name] is the primary care giver prn   Retired from [employer] with a BS degree   Social Determinants of Health   Financial Resource Strain: Low Risk  (07/25/2020)   Overall Financial Resource Strain (CARDIA)    Difficulty of Paying Living Expenses: Not hard at all  Food Insecurity: No Food Insecurity (06/17/2022)   Hunger Vital Sign    Worried About Running Out of Food in the Last Year: Never true    Ran Out of Food in the Last Year: Never true  Transportation Needs: No Transportation Needs (06/17/2022)   PRAPARE - Administrator, Civil Service (Medical): No    Lack of Transportation (Non-Medical): No  Physical Activity: Not on file  Stress: No Stress Concern Present (07/25/2020)   [employer] of Occupational Health - Occupational Stress Questionnaire    Feeling of Stress : Not at all  Social Connections: Moderately Integrated (07/25/2020)   Social Connection and  Isolation Panel [NHANES]    Frequency of Communication with Friends and Family: More than three times a week    Frequency of Social Gatherings with Friends and Family: More than three times a week    Attends Religious Services: 1 to 4 times per year    Active Member of [employer] or Organizations: Yes    Attends Banker Meetings: 1 to 4 times per year    Marital Status: Widowed     Family History: The patient's family history includes CAD in an other family member.  ROS:   Please see the history of present illness.     All other systems reviewed and are negative.  EKGs/Labs/Other Studies Reviewed:    The following studies were reviewed  today:  Echo 06/2022: 1. Left ventricular ejection fraction, by estimation, is 20 to 25%. The  left ventricle has severely decreased function. The left ventricle  demonstrates global hypokinesis. The left ventricular internal cavity size  was mildly dilated. Left ventricular  diastolic parameters are indeterminate.   2. Right ventricular systolic function is severely reduced. The right  ventricular size is normal. There is moderately elevated pulmonary artery  systolic pressure. The estimated right ventricular systolic pressure is  47.0 mmHg.   3. Left atrial size was severely dilated.   4. Right atrial size was severely dilated.   5. The mitral valve is normal in structure. Trivial mitral valve  regurgitation. No evidence of mitral stenosis.   6. Tricuspid valve regurgitation is moderate to severe.   7. The aortic valve was not well visualized. Aortic valve regurgitation  is trivial. Aortic valve sclerosis/calcification is present, without any  evidence of aortic stenosis.   8. The inferior vena cava is dilated in size with <50% respiratory  variability, suggesting right atrial pressure of 15 mmHg.   EKG:  EKG is not ordered today.    Recent Labs: 06/16/2022: TSH 1.803 06/18/2022: ALT 30 06/20/2022: BUN 40; Creatinine, Ser 1.60;  Hemoglobin 11.9; Magnesium 2.3; Platelets 113; Potassium 3.7; Sodium 137 07/09/2022: BNP 2,115.6  Recent Lipid Panel    Component Value Date/Time   CHOL 132 04/14/2018 0844   TRIG 68 04/14/2018 0844   HDL 66 04/14/2018 0844   CHOLHDL 2.0 04/14/2018 0844   CHOLHDL 2.2 02/14/2015 0905   VLDL 7 02/14/2015 0905   LDLCALC 52 04/14/2018 0844     Risk Assessment/Calculations:                Physical Exam:    VS:  BP 124/67   Pulse (!) 58   Ht 5\' 11"  (1.803 m)   Wt 142 lb 9.6 oz (64.7 kg)   BMI 19.89 kg/m     Wt Readings from Last 3 Encounters:  08/16/22 142 lb 9.6 oz (64.7 kg)  07/09/22 149 lb 12.8 oz (67.9 kg)  06/20/22 159 lb 6.3 oz (72.3 kg)     GEN:  Well nourished, well developed in no acute distress HEENT: Normal NECK: No JVD; No carotid bruits LYMPHATICS: No lymphadenopathy CARDIAC: RRR, no murmurs, rubs, gallops RESPIRATORY:  Clear to auscultation without rales, wheezing or rhonchi  ABDOMEN: Soft, non-tender, non-distended MUSCULOSKELETAL:  B LE edema and redness SKIN: Warm and dry NEUROLOGIC:  Alert and oriented x 3 PSYCHIATRIC:  Normal affect   ASSESSMENT:    1. Biventricular heart failure (HCC)   2. Primary hypertension   3. Hx of CABG   4. Coronary artery disease due to lipid rich plaque   5. Hyperlipidemia with target LDL less than 70   6. SVT (supraventricular tachycardia) (HCC)    PLAN:    In order of problems listed above:  Chronic biventricular heart failure Hypertension  LVEF 20-25%, severely reduced RV function with elevated PASP Severe biatrial enlargement GDMT: 12.5 mg spironolactone, 20 mg torsemide every other day, 50 mg losartan, 5 mEq potassium every other day, cardura No SGLT2i given skin breakdown   CAD s/p CABG, subsequent PCI Hyperlipidemia with LDL goal less than 70 No further ischemic evaluation given age Continue aspirin, 10 mg Lipitor I do not feel strongly about repeating a lipid panel in this  patient   SVT Managed with 100 mg amiodarone   Disposition Son is very helpful with is care and medications.  Follow up with Dr. Jens Som after his 89th birthday.      Medication Adjustments/Labs and Tests Ordered: Current medicines are reviewed at length with the patient today.  Concerns regarding medicines are outlined above.  No orders of the defined types were placed in this encounter.  No orders of the defined types were placed in this encounter.   Patient Instructions  Medication Instructions:  No changes *If you need a refill on your cardiac medications before your next appointment, please call your pharmacy*   Lab Work: No Labs If you have labs (blood work) drawn today and your tests are completely normal, you will receive your results only by: MyChart Message (if you have MyChart) OR A paper copy in the mail If you have any lab test that is abnormal or we need to change your treatment, we will call you to review the results.   Testing/Procedures: No Testing   Follow-Up: At [facility], you and your health needs are our priority.  As part of our continuing mission to provide you with exceptional heart care, we have created designated Provider Care Teams.  These Care Teams include your primary Cardiologist (physician) and Advanced Practice Providers (APPs -  Physician Assistants and Nurse Practitioners) who all work together to provide you with the care you need, when you need it.  We recommend signing up for the patient portal called "MyChart".  Sign up information is provided on this After Visit Summary.  MyChart is used to connect with patients for Virtual Visits (Telemedicine).  Patients are able to view lab/test results, encounter notes, upcoming appointments, etc.  Non-urgent messages can be sent to your provider as well.   To learn more about what you can do with MyChart, go to [email or web address].    Your next appointment:   4-5  month(s)  The format for your next appointment:   In Person  Provider:   Olga Millers, MD     Signed, Roe Rutherford Kyira Volkert, [place]  08/16/2022 2:58 PM    [facility]

## 2022-08-11 NOTE — Progress Notes (Signed)
  Care Coordination Note  08/11/2022 Name: Franklin Gonzalez MRN: [id number] DOB: 11/03/1921  Franklin Gonzalez is a 88 y.o. year old male who is a primary care patient of Donetta Potts, MD and is actively engaged with the care management team. I reached out to Lavell Islam by phone today to assist with re-scheduling a follow up visit with the RN Case Manager  Follow up plan: Unsuccessful telephone outreach attempt made. A HIPAA compliant phone message was left for the patient providing contact information and requesting a return call.   [facility]  Care Coordination Care Guide  Direct Dial: [phone]

## 2022-08-16 ENCOUNTER — Encounter: Payer: Self-pay | Admitting: Physician Assistant

## 2022-08-16 ENCOUNTER — Ambulatory Visit: Payer: Medicare Other | Attending: Physician Assistant | Admitting: Physician Assistant

## 2022-08-16 VITALS — BP 124/67 | HR 58 | Ht 71.0 in | Wt 142.6 lb

## 2022-08-16 DIAGNOSIS — I10 Essential (primary) hypertension: Secondary | ICD-10-CM | POA: Diagnosis not present

## 2022-08-16 DIAGNOSIS — I5082 Biventricular heart failure: Secondary | ICD-10-CM | POA: Diagnosis not present

## 2022-08-16 DIAGNOSIS — I471 Supraventricular tachycardia, unspecified: Secondary | ICD-10-CM | POA: Diagnosis not present

## 2022-08-16 DIAGNOSIS — I2510 Atherosclerotic heart disease of native coronary artery without angina pectoris: Secondary | ICD-10-CM | POA: Diagnosis not present

## 2022-08-16 DIAGNOSIS — I2583 Coronary atherosclerosis due to lipid rich plaque: Secondary | ICD-10-CM | POA: Diagnosis not present

## 2022-08-16 DIAGNOSIS — Z951 Presence of aortocoronary bypass graft: Secondary | ICD-10-CM

## 2022-08-16 DIAGNOSIS — E785 Hyperlipidemia, unspecified: Secondary | ICD-10-CM | POA: Diagnosis not present

## 2022-08-16 NOTE — Patient Instructions (Signed)
Medication Instructions:  No changes *If you need a refill on your cardiac medications before your next appointment, please call your pharmacy*   Lab Work: No Labs If you have labs (blood work) drawn today and your tests are completely normal, you will receive your results only by: MyChart Message (if you have MyChart) OR A paper copy in the mail If you have any lab test that is abnormal or we need to change your treatment, we will call you to review the results.   Testing/Procedures: No Testing   Follow-Up: At [facility], you and your health needs are our priority.  As part of our continuing mission to provide you with exceptional heart care, we have created designated Provider Care Teams.  These Care Teams include your primary Cardiologist (physician) and Advanced Practice Providers (APPs -  Physician Assistants and Nurse Practitioners) who all work together to provide you with the care you need, when you need it.  We recommend signing up for the patient portal called "MyChart".  Sign up information is provided on this After Visit Summary.  MyChart is used to connect with patients for Virtual Visits (Telemedicine).  Patients are able to view lab/test results, encounter notes, upcoming appointments, etc.  Non-urgent messages can be sent to your provider as well.   To learn more about what you can do with MyChart, go to [email or web address].    Your next appointment:   4-5 month(s)  The format for your next appointment:   In Person  Provider:   Olga Millers, MD

## 2022-08-17 ENCOUNTER — Encounter: Payer: Self-pay | Admitting: *Deleted

## 2022-08-17 ENCOUNTER — Ambulatory Visit: Payer: Self-pay | Admitting: *Deleted

## 2022-08-17 DIAGNOSIS — I5043 Acute on chronic combined systolic (congestive) and diastolic (congestive) heart failure: Secondary | ICD-10-CM | POA: Diagnosis not present

## 2022-08-17 DIAGNOSIS — N1832 Chronic kidney disease, stage 3b: Secondary | ICD-10-CM | POA: Diagnosis not present

## 2022-08-17 DIAGNOSIS — J9601 Acute respiratory failure with hypoxia: Secondary | ICD-10-CM | POA: Diagnosis not present

## 2022-08-17 DIAGNOSIS — I130 Hypertensive heart and chronic kidney disease with heart failure and stage 1 through stage 4 chronic kidney disease, or unspecified chronic kidney disease: Secondary | ICD-10-CM | POA: Diagnosis not present

## 2022-08-17 DIAGNOSIS — E1122 Type 2 diabetes mellitus with diabetic chronic kidney disease: Secondary | ICD-10-CM | POA: Diagnosis not present

## 2022-08-17 DIAGNOSIS — E1165 Type 2 diabetes mellitus with hyperglycemia: Secondary | ICD-10-CM | POA: Diagnosis not present

## 2022-08-17 NOTE — Patient Outreach (Signed)
  Care Coordination   Follow Up Visit Note   08/17/2022 Name: Franklin Gonzalez MRN: [id number] DOB: 1922-07-19  Franklin Gonzalez is a 88 y.o. year old male who sees Donetta Potts, MD for primary care. I spoke with  Lavell Islam by phone today.  What matters to the patients health and wellness today?  Ongoing self health management    Goals Addressed               This Visit's Progress     Patient Stated     COMPLETED: [facility]) Find Help in My Community (pt-stated)        Timeframe:  Long-Range Goal Priority:  Medium Start Date:                09/08/20             Expected End Date:        01/03/21               Follow Up Date 12/11/20   - begin a notebook of services in my neighborhood or community - follow-up on any referrals for help I am given - make a list of family or friends that I can call    Why is this important?   Knowing how and where to find help for yourself or family in your neighborhood and community is an important skill.  You will want to take some steps to learn how.    Notes: 11/20/20 unsuccessful outreach       COMPLETED: Wound care for skin tear (pt-stated)        Care Coordination Interventions: Evaluation of current treatment plan related to Skin tear and patient's adherence to plan as established by provider Discussed home health care with patient. Wound has healed. Physical therapy has ended and he does feel stronger and more mobile. States that a nurse is still coming out weekly to check on him and to check his blood pressure  Goal complete       Other     COMPLETED: Care Coordination Services (No Follow-up Needed)        Care Coordination Interventions: Evaluation of current treatment plan related to biventricular heart failure and patient's adherence to plan as established by provider Reviewed medications with patient and discussed access, affordability, and compliance Reviewed scheduled/upcoming provider appointments including Dr Jens Som  12/2022 Assessed social determinant of health barriers Discussed visit with cardiology NP yesterday Assessed family/social support. Son is very supportive and assists with meds, transportation, etc Discussed current state of health. Patient feels well and doesn't have any additional resource or care coordination needs at this time.         SDOH assessments and interventions completed:  Yes    SDOH Interventions Today    Flowsheet Row Most Recent Value  SDOH Interventions   Housing Interventions Intervention Not Indicated  Transportation Interventions Intervention Not Indicated  Financial Strain Interventions Intervention Not Indicated       Care Coordination Interventions:  Yes, provided   Follow up plan: No further intervention required.   Encounter Outcome:  Pt. Visit Completed   Demetrios Loll, BSN, RN-BC RN Care Coordinator [facility]  [facility] Direct Dial: [phone] Main #: [phone]

## 2022-08-17 NOTE — Progress Notes (Signed)
  Care Coordination Note  08/17/2022 Name: Franklin Gonzalez MRN: [id number] DOB: 07/24/1922  Franklin Gonzalez is a 88 y.o. year old male who is a primary care patient of Donetta Potts, MD and is actively engaged with the care management team. I reached out to Lavell Islam by phone today to assist with re-scheduling a follow up visit with the RN Case Manager  Follow up plan: Telephone appointment with care management team member scheduled for:08/17/22  [facility] Coordination Care Guide  Direct Dial: [phone]

## 2022-08-27 DIAGNOSIS — J9601 Acute respiratory failure with hypoxia: Secondary | ICD-10-CM | POA: Diagnosis not present

## 2022-08-27 DIAGNOSIS — I5043 Acute on chronic combined systolic (congestive) and diastolic (congestive) heart failure: Secondary | ICD-10-CM | POA: Diagnosis not present

## 2022-08-27 DIAGNOSIS — N1832 Chronic kidney disease, stage 3b: Secondary | ICD-10-CM | POA: Diagnosis not present

## 2022-08-27 DIAGNOSIS — E1122 Type 2 diabetes mellitus with diabetic chronic kidney disease: Secondary | ICD-10-CM | POA: Diagnosis not present

## 2022-08-27 DIAGNOSIS — I130 Hypertensive heart and chronic kidney disease with heart failure and stage 1 through stage 4 chronic kidney disease, or unspecified chronic kidney disease: Secondary | ICD-10-CM | POA: Diagnosis not present

## 2022-08-27 DIAGNOSIS — E1165 Type 2 diabetes mellitus with hyperglycemia: Secondary | ICD-10-CM | POA: Diagnosis not present

## 2022-10-17 ENCOUNTER — Encounter (HOSPITAL_COMMUNITY): Payer: Self-pay | Admitting: Emergency Medicine

## 2022-10-17 ENCOUNTER — Other Ambulatory Visit: Payer: Self-pay

## 2022-10-17 ENCOUNTER — Inpatient Hospital Stay (HOSPITAL_COMMUNITY)
Admission: EM | Admit: 2022-10-17 | Discharge: 2022-10-20 | DRG: 291 | Disposition: A | Discharge status: Home Health | Payer: Medicare Other | Attending: Family Medicine | Admitting: Family Medicine

## 2022-10-17 ENCOUNTER — Observation Stay (HOSPITAL_COMMUNITY): Payer: Medicare Other

## 2022-10-17 ENCOUNTER — Emergency Department (HOSPITAL_COMMUNITY): Payer: Medicare Other

## 2022-10-17 DIAGNOSIS — Z01810 Encounter for preprocedural cardiovascular examination: Secondary | ICD-10-CM | POA: Diagnosis not present

## 2022-10-17 DIAGNOSIS — E785 Hyperlipidemia, unspecified: Secondary | ICD-10-CM | POA: Diagnosis present

## 2022-10-17 DIAGNOSIS — I509 Heart failure, unspecified: Secondary | ICD-10-CM

## 2022-10-17 DIAGNOSIS — K8000 Calculus of gallbladder with acute cholecystitis without obstruction: Secondary | ICD-10-CM | POA: Diagnosis present

## 2022-10-17 DIAGNOSIS — I2510 Atherosclerotic heart disease of native coronary artery without angina pectoris: Secondary | ICD-10-CM | POA: Diagnosis present

## 2022-10-17 DIAGNOSIS — I5043 Acute on chronic combined systolic (congestive) and diastolic (congestive) heart failure: Secondary | ICD-10-CM | POA: Diagnosis present

## 2022-10-17 DIAGNOSIS — I5082 Biventricular heart failure: Secondary | ICD-10-CM | POA: Diagnosis present

## 2022-10-17 DIAGNOSIS — K761 Chronic passive congestion of liver: Secondary | ICD-10-CM | POA: Diagnosis present

## 2022-10-17 DIAGNOSIS — I5023 Acute on chronic systolic (congestive) heart failure: Secondary | ICD-10-CM | POA: Diagnosis not present

## 2022-10-17 DIAGNOSIS — D696 Thrombocytopenia, unspecified: Secondary | ICD-10-CM | POA: Diagnosis present

## 2022-10-17 DIAGNOSIS — I071 Rheumatic tricuspid insufficiency: Secondary | ICD-10-CM | POA: Diagnosis present

## 2022-10-17 DIAGNOSIS — B179 Acute viral hepatitis, unspecified: Secondary | ICD-10-CM | POA: Diagnosis present

## 2022-10-17 DIAGNOSIS — K862 Cyst of pancreas: Secondary | ICD-10-CM | POA: Diagnosis present

## 2022-10-17 DIAGNOSIS — K7200 Acute and subacute hepatic failure without coma: Secondary | ICD-10-CM | POA: Diagnosis present

## 2022-10-17 DIAGNOSIS — K8020 Calculus of gallbladder without cholecystitis without obstruction: Secondary | ICD-10-CM

## 2022-10-17 DIAGNOSIS — Z7982 Long term (current) use of aspirin: Secondary | ICD-10-CM

## 2022-10-17 DIAGNOSIS — E1122 Type 2 diabetes mellitus with diabetic chronic kidney disease: Secondary | ICD-10-CM | POA: Diagnosis present

## 2022-10-17 DIAGNOSIS — I452 Bifascicular block: Secondary | ICD-10-CM | POA: Diagnosis present

## 2022-10-17 DIAGNOSIS — Z951 Presence of aortocoronary bypass graft: Secondary | ICD-10-CM

## 2022-10-17 DIAGNOSIS — Z8249 Family history of ischemic heart disease and other diseases of the circulatory system: Secondary | ICD-10-CM

## 2022-10-17 DIAGNOSIS — R7401 Elevation of levels of liver transaminase levels: Secondary | ICD-10-CM | POA: Diagnosis not present

## 2022-10-17 DIAGNOSIS — D631 Anemia in chronic kidney disease: Secondary | ICD-10-CM | POA: Diagnosis present

## 2022-10-17 DIAGNOSIS — Z862 Personal history of diseases of the blood and blood-forming organs and certain disorders involving the immune mechanism: Secondary | ICD-10-CM

## 2022-10-17 DIAGNOSIS — I493 Ventricular premature depolarization: Secondary | ICD-10-CM | POA: Diagnosis present

## 2022-10-17 DIAGNOSIS — J9601 Acute respiratory failure with hypoxia: Secondary | ICD-10-CM | POA: Diagnosis present

## 2022-10-17 DIAGNOSIS — N1832 Chronic kidney disease, stage 3b: Secondary | ICD-10-CM | POA: Diagnosis present

## 2022-10-17 DIAGNOSIS — I429 Cardiomyopathy, unspecified: Secondary | ICD-10-CM | POA: Diagnosis present

## 2022-10-17 DIAGNOSIS — N189 Chronic kidney disease, unspecified: Secondary | ICD-10-CM

## 2022-10-17 DIAGNOSIS — I130 Hypertensive heart and chronic kidney disease with heart failure and stage 1 through stage 4 chronic kidney disease, or unspecified chronic kidney disease: Principal | ICD-10-CM | POA: Diagnosis present

## 2022-10-17 DIAGNOSIS — E119 Type 2 diabetes mellitus without complications: Secondary | ICD-10-CM | POA: Diagnosis not present

## 2022-10-17 DIAGNOSIS — Z66 Do not resuscitate: Secondary | ICD-10-CM | POA: Diagnosis present

## 2022-10-17 DIAGNOSIS — E039 Hypothyroidism, unspecified: Secondary | ICD-10-CM | POA: Diagnosis not present

## 2022-10-17 DIAGNOSIS — R0609 Other forms of dyspnea: Secondary | ICD-10-CM | POA: Diagnosis not present

## 2022-10-17 DIAGNOSIS — E1165 Type 2 diabetes mellitus with hyperglycemia: Secondary | ICD-10-CM | POA: Diagnosis present

## 2022-10-17 DIAGNOSIS — I10 Essential (primary) hypertension: Secondary | ICD-10-CM | POA: Diagnosis present

## 2022-10-17 DIAGNOSIS — Z955 Presence of coronary angioplasty implant and graft: Secondary | ICD-10-CM

## 2022-10-17 DIAGNOSIS — R7989 Other specified abnormal findings of blood chemistry: Secondary | ICD-10-CM | POA: Diagnosis not present

## 2022-10-17 DIAGNOSIS — Z79890 Hormone replacement therapy (postmenopausal): Secondary | ICD-10-CM

## 2022-10-17 DIAGNOSIS — R531 Weakness: Secondary | ICD-10-CM | POA: Diagnosis present

## 2022-10-17 DIAGNOSIS — R112 Nausea with vomiting, unspecified: Secondary | ICD-10-CM | POA: Diagnosis not present

## 2022-10-17 DIAGNOSIS — I4719 Other supraventricular tachycardia: Secondary | ICD-10-CM | POA: Diagnosis present

## 2022-10-17 DIAGNOSIS — N179 Acute kidney failure, unspecified: Secondary | ICD-10-CM | POA: Diagnosis not present

## 2022-10-17 DIAGNOSIS — I252 Old myocardial infarction: Secondary | ICD-10-CM

## 2022-10-17 DIAGNOSIS — Z7984 Long term (current) use of oral hypoglycemic drugs: Secondary | ICD-10-CM

## 2022-10-17 DIAGNOSIS — I82431 Acute embolism and thrombosis of right popliteal vein: Secondary | ICD-10-CM | POA: Diagnosis present

## 2022-10-17 DIAGNOSIS — Z79899 Other long term (current) drug therapy: Secondary | ICD-10-CM

## 2022-10-17 HISTORY — DX: Unspecified systolic (congestive) heart failure: I50.20

## 2022-10-17 LAB — TSH: TSH: 0.676 u[IU]/mL (ref 0.350–4.500)

## 2022-10-17 LAB — PROTIME-INR
INR: 1.3 — ABNORMAL HIGH (ref 0.8–1.2)
Prothrombin Time: 15.9 seconds — ABNORMAL HIGH (ref 11.4–15.2)

## 2022-10-17 LAB — CBC WITH DIFFERENTIAL/PLATELET
Abs Immature Granulocytes: 0.06 10*3/uL (ref 0.00–0.07)
Basophils Absolute: 0 10*3/uL (ref 0.0–0.1)
Basophils Relative: 0 %
Eosinophils Absolute: 0 10*3/uL (ref 0.0–0.5)
Eosinophils Relative: 0 %
HCT: 38 % — ABNORMAL LOW (ref 39.0–52.0)
Hemoglobin: 12.2 g/dL — ABNORMAL LOW (ref 13.0–17.0)
Immature Granulocytes: 1 %
Lymphocytes Relative: 12 %
Lymphs Abs: 0.9 10*3/uL (ref 0.7–4.0)
MCH: 31.3 pg (ref 26.0–34.0)
MCHC: 32.1 g/dL (ref 30.0–36.0)
MCV: 97.4 fL (ref 80.0–100.0)
Monocytes Absolute: 0.5 10*3/uL (ref 0.1–1.0)
Monocytes Relative: 7 %
Neutro Abs: 6.2 10*3/uL (ref 1.7–7.7)
Neutrophils Relative %: 80 %
Platelets: 114 10*3/uL — ABNORMAL LOW (ref 150–400)
RBC: 3.9 MIL/uL — ABNORMAL LOW (ref 4.22–5.81)
RDW: 14.9 % (ref 11.5–15.5)
WBC: 7.8 10*3/uL (ref 4.0–10.5)
nRBC: 0 % (ref 0.0–0.2)

## 2022-10-17 LAB — URINALYSIS, COMPLETE (UACMP) WITH MICROSCOPIC
Bilirubin Urine: NEGATIVE
Glucose, UA: NEGATIVE mg/dL
Ketones, ur: NEGATIVE mg/dL
Nitrite: NEGATIVE
Protein, ur: 30 mg/dL — AB
Specific Gravity, Urine: 1.016 (ref 1.005–1.030)
pH: 5 (ref 5.0–8.0)

## 2022-10-17 LAB — CBC
HCT: 36.5 % — ABNORMAL LOW (ref 39.0–52.0)
Hemoglobin: 11.8 g/dL — ABNORMAL LOW (ref 13.0–17.0)
MCH: 31.6 pg (ref 26.0–34.0)
MCHC: 32.3 g/dL (ref 30.0–36.0)
MCV: 97.6 fL (ref 80.0–100.0)
Platelets: 111 10*3/uL — ABNORMAL LOW (ref 150–400)
RBC: 3.74 MIL/uL — ABNORMAL LOW (ref 4.22–5.81)
RDW: 14.8 % (ref 11.5–15.5)
WBC: 8.2 10*3/uL (ref 4.0–10.5)
nRBC: 0 % (ref 0.0–0.2)

## 2022-10-17 LAB — COMPREHENSIVE METABOLIC PANEL
ALT: 483 U/L — ABNORMAL HIGH (ref 0–44)
ALT: 471 U/L — ABNORMAL HIGH (ref 0–44)
AST: 501 U/L — ABNORMAL HIGH (ref 15–41)
AST: 366 U/L — ABNORMAL HIGH (ref 15–41)
Albumin: 3.2 g/dL — ABNORMAL LOW (ref 3.5–5.0)
Albumin: 3.3 g/dL — ABNORMAL LOW (ref 3.5–5.0)
Alkaline Phosphatase: 117 U/L (ref 38–126)
Alkaline Phosphatase: 112 U/L (ref 38–126)
Anion gap: 10 (ref 5–15)
Anion gap: 8 (ref 5–15)
BUN: 64 mg/dL — ABNORMAL HIGH (ref 8–23)
BUN: 66 mg/dL — ABNORMAL HIGH (ref 8–23)
CO2: 19 mmol/L — ABNORMAL LOW (ref 22–32)
CO2: 20 mmol/L — ABNORMAL LOW (ref 22–32)
Calcium: 8.1 mg/dL — ABNORMAL LOW (ref 8.9–10.3)
Calcium: 8.6 mg/dL — ABNORMAL LOW (ref 8.9–10.3)
Chloride: 109 mmol/L (ref 98–111)
Chloride: 109 mmol/L (ref 98–111)
Creatinine, Ser: 1.92 mg/dL — ABNORMAL HIGH (ref 0.61–1.24)
Creatinine, Ser: 1.95 mg/dL — ABNORMAL HIGH (ref 0.61–1.24)
GFR, Estimated: 31 mL/min — ABNORMAL LOW (ref >60)
GFR, Estimated: 31 mL/min — ABNORMAL LOW (ref >60)
Glucose, Bld: 289 mg/dL — ABNORMAL HIGH (ref 70–99)
Glucose, Bld: 261 mg/dL — ABNORMAL HIGH (ref 70–99)
Potassium: 5 mmol/L (ref 3.5–5.1)
Potassium: 5 mmol/L (ref 3.5–5.1)
Sodium: 138 mmol/L (ref 135–145)
Sodium: 137 mmol/L (ref 135–145)
Total Bilirubin: 0.7 mg/dL (ref 0.3–1.2)
Total Bilirubin: 0.6 mg/dL (ref 0.3–1.2)
Total Protein: 5.5 g/dL — ABNORMAL LOW (ref 6.5–8.1)
Total Protein: 5.5 g/dL — ABNORMAL LOW (ref 6.5–8.1)

## 2022-10-17 LAB — HEPATITIS PANEL, ACUTE
HCV Ab: NONREACTIVE
Hep A IgM: NONREACTIVE
Hep B C IgM: NONREACTIVE
Hepatitis B Surface Ag: NONREACTIVE

## 2022-10-17 LAB — ACETAMINOPHEN LEVEL: Acetaminophen (Tylenol), Serum: <10 ug/mL — ABNORMAL LOW (ref 10–30)

## 2022-10-17 LAB — GLUCOSE, CAPILLARY
Glucose-Capillary: 257 mg/dL — ABNORMAL HIGH (ref 70–99)
Glucose-Capillary: 210 mg/dL — ABNORMAL HIGH (ref 70–99)
Glucose-Capillary: 182 mg/dL — ABNORMAL HIGH (ref 70–99)
Glucose-Capillary: 143 mg/dL — ABNORMAL HIGH (ref 70–99)

## 2022-10-17 LAB — HEPARIN LEVEL (UNFRACTIONATED): Heparin Unfractionated: 0.54 IU/mL (ref 0.30–0.70)

## 2022-10-17 LAB — CREATININE, URINE, RANDOM: Creatinine, Urine: 38.99 mg/dL

## 2022-10-17 LAB — SODIUM, URINE, RANDOM: Sodium, Ur: 82 mmol/L

## 2022-10-17 LAB — BRAIN NATRIURETIC PEPTIDE: B Natriuretic Peptide: 3338 pg/mL — ABNORMAL HIGH (ref 0.0–100.0)

## 2022-10-17 LAB — MAGNESIUM: Magnesium: 2.6 mg/dL — ABNORMAL HIGH (ref 1.7–2.4)

## 2022-10-17 LAB — LIPASE, BLOOD: Lipase: 37 U/L (ref 11–51)

## 2022-10-17 LAB — CBG MONITORING, ED: Glucose-Capillary: 245 mg/dL — ABNORMAL HIGH (ref 70–99)

## 2022-10-17 MED ORDER — SODIUM CHLORIDE 0.9 % IV SOLN
2.0000 g | INTRAVENOUS | Status: DC
  Administered 2022-10-17 – 2022-10-19 (×3): 2 g via INTRAVENOUS
  Filled 2022-10-17 (×3): qty 20

## 2022-10-17 MED ORDER — INSULIN ASPART 100 UNIT/ML IJ SOLN
0.0000 [IU] | Freq: Three times a day (TID) | INTRAMUSCULAR | Status: DC
  Administered 2022-10-17: 3 [IU] via SUBCUTANEOUS
  Administered 2022-10-17: 1 [IU] via SUBCUTANEOUS
  Administered 2022-10-17: 3 [IU] via SUBCUTANEOUS
  Administered 2022-10-18: 1 [IU] via SUBCUTANEOUS
  Administered 2022-10-19: 2 [IU] via SUBCUTANEOUS
  Filled 2022-10-17: qty 1

## 2022-10-17 MED ORDER — FUROSEMIDE 10 MG/ML IJ SOLN
INTRAMUSCULAR | Status: AC
  Filled 2022-10-17: qty 20

## 2022-10-17 MED ORDER — IOHEXOL 300 MG/ML  SOLN
80.0000 mL | Freq: Once | INTRAMUSCULAR | Status: AC | PRN
  Administered 2022-10-17: 80 mL via INTRAVENOUS

## 2022-10-17 MED ORDER — FUROSEMIDE 10 MG/ML IJ SOLN
40.0000 mg | Freq: Once | INTRAMUSCULAR | Status: AC
  Administered 2022-10-17: 40 mg via INTRAVENOUS
  Filled 2022-10-17: qty 4

## 2022-10-17 MED ORDER — SODIUM CHLORIDE 0.9 % IV BOLUS
500.0000 mL | Freq: Once | INTRAVENOUS | Status: AC
  Administered 2022-10-17: 500 mL via INTRAVENOUS

## 2022-10-17 MED ORDER — ACETAMINOPHEN 650 MG RE SUPP: 650.0000 mg | Freq: Four times a day (QID) | RECTAL | Status: DC | PRN

## 2022-10-17 MED ORDER — NALOXONE HCL 0.4 MG/ML IJ SOLN: 0.4000 mg | INTRAMUSCULAR | Status: DC | PRN

## 2022-10-17 MED ORDER — AMIODARONE HCL 200 MG PO TABS
100.0000 mg | ORAL_TABLET | Freq: Every day | ORAL | Status: DC
  Administered 2022-10-17 – 2022-10-20 (×4): 100 mg via ORAL
  Filled 2022-10-17 (×4): qty 1

## 2022-10-17 MED ORDER — METRONIDAZOLE 500 MG/100ML IV SOLN
500.0000 mg | Freq: Two times a day (BID) | INTRAVENOUS | Status: DC
  Administered 2022-10-17 – 2022-10-20 (×6): 500 mg via INTRAVENOUS
  Filled 2022-10-17 (×6): qty 100

## 2022-10-17 MED ORDER — FUROSEMIDE 10 MG/ML IJ SOLN
4.0000 mg/h | INTRAVENOUS | Status: DC
  Administered 2022-10-17 – 2022-10-19 (×3): 4 mg/h via INTRAVENOUS
  Filled 2022-10-17 (×2): qty 20

## 2022-10-17 MED ORDER — ONDANSETRON HCL 4 MG/2ML IJ SOLN: 4.0000 mg | Freq: Four times a day (QID) | INTRAMUSCULAR | Status: DC | PRN

## 2022-10-17 MED ORDER — HEPARIN (PORCINE) 25000 UT/250ML-% IV SOLN
1000.0000 [IU]/h | INTRAVENOUS | Status: DC
  Administered 2022-10-17 – 2022-10-18 (×2): 1100 [IU]/h via INTRAVENOUS
  Filled 2022-10-17 (×3): qty 250

## 2022-10-17 MED ORDER — MELATONIN 3 MG PO TABS: 3.0000 mg | ORAL_TABLET | Freq: Every evening | ORAL | Status: DC | PRN

## 2022-10-17 MED ORDER — ONDANSETRON HCL 4 MG/2ML IJ SOLN
2.0000 mg | Freq: Once | INTRAMUSCULAR | Status: AC
  Administered 2022-10-17: 2 mg via INTRAVENOUS
  Filled 2022-10-17: qty 2

## 2022-10-17 MED ORDER — FENTANYL CITRATE PF 50 MCG/ML IJ SOSY: 25.0000 ug | PREFILLED_SYRINGE | INTRAMUSCULAR | Status: DC | PRN

## 2022-10-17 MED ORDER — SODIUM CHLORIDE 0.9 % IV SOLN: INTRAVENOUS | Status: DC | PRN

## 2022-10-17 MED ORDER — ACETAMINOPHEN 325 MG PO TABS: 650.0000 mg | ORAL_TABLET | Freq: Four times a day (QID) | ORAL | Status: DC | PRN

## 2022-10-17 MED ORDER — ASPIRIN 81 MG PO TBEC
81.0000 mg | DELAYED_RELEASE_TABLET | Freq: Every day | ORAL | Status: DC
  Administered 2022-10-17 – 2022-10-20 (×4): 81 mg via ORAL
  Filled 2022-10-17 (×4): qty 1

## 2022-10-17 MED ORDER — LEVOTHYROXINE SODIUM 88 MCG PO TABS
88.0000 ug | ORAL_TABLET | Freq: Every day | ORAL | Status: DC
  Administered 2022-10-17 – 2022-10-20 (×3): 88 ug via ORAL
  Filled 2022-10-17 (×4): qty 1

## 2022-10-17 NOTE — TOC Progression Note (Signed)
Transition of Care [facility]) - Progression Note    Patient Details  Name: Franklin Gonzalez MRN: [id number] Date of Birth: 26-Jul-1922  Transition of Care [facility]) CM/SW Contact  Salome Arnt, [place] Phone Number: 10/17/2022, 10:49 AM  Clinical Narrative:  Transition of Care [facility]) Screening Note   Patient Details  Name: Franklin Gonzalez Date of Birth: 02-Dec-1921   Transition of Care [facility]) CM/SW Contact:    Salome Arnt, [place] Phone Number: 10/17/2022, 10:49 AM    Transition of Care Department [facility]) has reviewed patient and no TOC needs have been identified at this time. We will continue to monitor patient advancement through interdisciplinary progression rounds. If new patient transition needs arise, please place a TOC consult.          Barriers to Discharge: Continued Medical Work up  Expected Discharge Plan and Services                                               Social Determinants of Health (SDOH) Interventions SDOH Screenings   Food Insecurity: No Food Insecurity (10/17/2022)  Housing: Low Risk  (10/17/2022)  Transportation Needs: No Transportation Needs (10/17/2022)  Utilities: Not At Risk (10/17/2022)  Depression (PHQ2-9): Low Risk  (07/25/2020)  Financial Resource Strain: Low Risk  (08/17/2022)  Social Connections: Moderately Integrated (07/25/2020)  Stress: No Stress Concern Present (07/25/2020)  Tobacco Use: Low Risk  (10/17/2022)    Readmission Risk Interventions    06/18/2022    4:40 PM  Readmission Risk Prevention Plan  Transportation Screening Complete  PCP or Specialist Appt within 3-5 Days Not Complete  HRI or [place] Complete  Social Work Consult for [place] Planning/Counseling Complete  Palliative Care Screening Complete  Medication Review [employer]) Complete

## 2022-10-17 NOTE — ED Provider Notes (Signed)
[facility] Provider Note   CSN: [id number] Arrival date & time: 10/17/22  0015     History  Chief Complaint  Patient presents with   Emesis    Franklin Gonzalez is a 89 y.o. male.  Patient is a 89 year old male brought by EMS and accompanied by his son for evaluation of vomiting.  This evening, he ate a chicken sandwich, then shortly afterward experienced discomfort in the "small of his back".  He then had several episodes of vomiting.  Always nonbloody and nonbilious.  The patient denies to me that he is experiencing abdominal pain and denies any fevers or chills.  There have been no ill contacts.  Patient lives at home with caregivers during the day and his son who lives less than a mile away.  The history is provided by the patient and a relative.       Home Medications Prior to Admission medications   Medication Sig Start Date End Date Taking? Authorizing Provider  amiodarone (PACERONE) 200 MG tablet Take 1/2 (one-half) tablet by mouth once daily 12/22/21   Lelon Perla, MD  ammonium lactate (AMLACTIN) 12 % cream Apply topically 2 (two) times daily. 06/07/22   [provider]  aspirin EC 81 MG EC tablet Take 1 tablet (81 mg total) by mouth daily. 10/07/12   Edmisten, Brooke O, PA-C  atorvastatin (LIPITOR) 10 MG tablet Take 10 mg by mouth at bedtime.    [provider]  dorzolamide-timolol (COSOPT) 22.3-6.8 MG/ML ophthalmic solution Place 1 drop into both eyes 2 (two) times daily. 09/21/21   [provider]  doxazosin (CARDURA) 4 MG tablet Take 2 mg by mouth at bedtime.    [provider]  EUTHYROX 75 MCG tablet Take 75 mcg by mouth daily. 09/01/20   [provider]  glipiZIDE (GLUCOTROL) 5 MG tablet Take 2.5 mg by mouth daily. 05/11/22   [provider]  latanoprost (XALATAN) 0.005 % ophthalmic solution Place 1 drop into both eyes every evening. 10/17/21   [provider]   levothyroxine (SYNTHROID) 88 MCG tablet Take 88 mcg by mouth daily. 11/15/21   [provider]  losartan (COZAAR) 50 MG tablet Take 1 tablet by mouth once daily 07/12/22   Lelon Perla, MD  nitroGLYCERIN (NITROSTAT) 0.4 MG SL tablet Place 1 tablet (0.4 mg total) under the tongue every 5 (five) minutes x 3 doses as needed for chest pain. 10/07/12   Edmisten, Brooke O, PA-C  potassium chloride (KLOR-CON M) 10 MEQ tablet Take 0.5 tablets (5 mEq total) by mouth daily. 07/09/22   Duke, Tami Lin, PA  spironolactone (ALDACTONE) 25 MG tablet Take 0.5 tablets (12.5 mg total) by mouth daily. 06/21/22   Mercy Riding, MD  torsemide (DEMADEX) 20 MG tablet Take 1 tablet (20 mg total) by mouth daily. Take one tablet every other day 06/20/22   Mercy Riding, MD      Allergies    Patient has no known allergies.    Review of Systems   Review of Systems  All other systems reviewed and are negative.   Physical Exam Updated Vital Signs BP 110/64   Pulse (!) 31   Temp (!) 97.5 F (36.4 C) (Oral)   Ht 5' 11"$  (1.803 m)   Wt 65 kg   SpO2 99%   BMI 19.99 kg/m  Physical Exam Vitals and nursing note reviewed.  Constitutional:      General: He is not in  acute distress.    Appearance: He is well-developed. He is not diaphoretic.  HENT:     Head: Normocephalic and atraumatic.  Cardiovascular:     Rate and Rhythm: Normal rate and regular rhythm.     Heart sounds: No murmur heard.    No friction rub.  Pulmonary:     Effort: Pulmonary effort is normal. No respiratory distress.     Breath sounds: Normal breath sounds. No wheezing or rales.  Abdominal:     General: Bowel sounds are normal. There is no distension.     Palpations: Abdomen is soft.     Tenderness: There is no abdominal tenderness.  Musculoskeletal:        General: Normal range of motion.     Cervical back: Normal range of motion and neck supple.  Skin:    General: Skin is warm and dry.  Neurological:     Mental Status:  He is alert and oriented to person, place, and time.     Coordination: Coordination normal.     ED Results / Procedures / Treatments   Labs (all labs ordered are listed, but only abnormal results are displayed) Labs Reviewed  COMPREHENSIVE METABOLIC PANEL - Abnormal; Notable for the following components:      Result Value   CO2 19 (*)    Glucose, Bld 289 (*)    BUN 64 (*)    Creatinine, Ser 1.92 (*)    Calcium 8.1 (*)    Total Protein 5.5 (*)    Albumin 3.2 (*)    AST 501 (*)    ALT 483 (*)    GFR, Estimated 31 (*)    All other components within normal limits  CBC - Abnormal; Notable for the following components:   RBC 3.74 (*)    Hemoglobin 11.8 (*)    HCT 36.5 (*)    Platelets 111 (*)    All other components within normal limits  LIPASE, BLOOD  URINALYSIS, ROUTINE W REFLEX MICROSCOPIC    EKG None  Radiology No results found.  Procedures Procedures    Medications Ordered in ED Medications  sodium chloride 0.9 % bolus 500 mL (has no administration in time range)  ondansetron (ZOFRAN) injection 2 mg (has no administration in time range)    ED Course/ Medical Decision Making/ A&P  Patient is a 89 year old male presenting with complaints of vomiting as described in the HPI.  He arrives here with stable vital signs and physical examination which is basically benign.  There is no abdominal tenderness.  Workup initiated including CBC, CMP, lipase.  AST and ALT are both approximately 500, but alk phos and total bili are normal.  BUN and creatinine are also both elevated above baseline.  CT scan of the abdomen and pelvis obtained showing no acute intra-abdominal pathology, but does show marked periportal edema which is nonspecific and could be caused by acute inflammatory conditions of the liver or biliary tree.    Since his LFTs are elevated with abnormal CT scan findings, patient will be admitted to the hospitalist service for further monitoring of his LFTs and  possible GI consultation.  I have spoken with Dr. Velia Meyer who agrees to admit.  Final Clinical Impression(s) / ED Diagnoses Final diagnoses:  None    Rx / DC Orders ED Discharge Orders     None         Veryl Speak, MD 10/17/22 [phone]

## 2022-10-17 NOTE — ED Notes (Signed)
Pt received and currently eating breakfast.

## 2022-10-17 NOTE — Progress Notes (Signed)
ANTICOAGULATION CONSULT NOTE - Initial Consult  Pharmacy Consult for heparin Indication: DVT  No Known Allergies  Patient Measurements: Height: 5' 11"$  (180.3 cm) Weight: 65 kg (143 lb 4.8 oz) IBW/kg (Calculated) : 75.3 Heparin Dosing Weight: 65  Vital Signs: Temp: 97.5 F (36.4 C) (02/11 1408) Temp Source: Oral (02/11 1408) BP: 108/72 (02/11 1408) Pulse Rate: 55 (02/11 1408)  Labs: Recent Labs    10/17/22 0035 10/17/22 0553 10/17/22 0738  HGB 11.8* 12.2*  --   HCT 36.5* 38.0*  --   PLT 111* 114*  --   LABPROT  --   --  15.9*  INR  --   --  1.3*  CREATININE 1.92* 1.95*  --     Estimated Creatinine Clearance: 19.4 mL/min (A) (by C-G formula based on SCr of 1.95 mg/dL (H)).   Medical History: Past Medical History:  Diagnosis Date   Bifascicular block    Coronary artery disease    CABG 1993 at [facility]; NSTEMI 09/2012 s/p DES to SVG-OM1, DES to SVG-PDA    DM (diabetes mellitus) (HCC)    HTN (hypertension)    SVT (supraventricular tachycardia)     Medications:  Medications Prior to Admission  Medication Sig Dispense Refill Last Dose   amiodarone (PACERONE) 200 MG tablet Take 1/2 (one-half) tablet by mouth once daily 45 tablet 3    ammonium lactate (AMLACTIN) 12 % cream Apply topically 2 (two) times daily.      aspirin EC 81 MG EC tablet Take 1 tablet (81 mg total) by mouth daily.      atorvastatin (LIPITOR) 10 MG tablet Take 10 mg by mouth at bedtime.      dorzolamide-timolol (COSOPT) 22.3-6.8 MG/ML ophthalmic solution Place 1 drop into both eyes 2 (two) times daily.      doxazosin (CARDURA) 4 MG tablet Take 2 mg by mouth at bedtime.      EUTHYROX 75 MCG tablet Take 75 mcg by mouth daily.      glipiZIDE (GLUCOTROL) 5 MG tablet Take 2.5 mg by mouth daily.      latanoprost (XALATAN) 0.005 % ophthalmic solution Place 1 drop into both eyes every evening.      levothyroxine (SYNTHROID) 88 MCG tablet Take 88 mcg by mouth daily.      losartan (COZAAR) 50 MG  tablet Take 1 tablet by mouth once daily 90 tablet 3    nitroGLYCERIN (NITROSTAT) 0.4 MG SL tablet Place 1 tablet (0.4 mg total) under the tongue every 5 (five) minutes x 3 doses as needed for chest pain. 60 tablet 3    potassium chloride (KLOR-CON M) 10 MEQ tablet Take 0.5 tablets (5 mEq total) by mouth daily. 90 tablet 1    spironolactone (ALDACTONE) 25 MG tablet Take 0.5 tablets (12.5 mg total) by mouth daily. 30 tablet 1    torsemide (DEMADEX) 20 MG tablet Take 1 tablet (20 mg total) by mouth daily. Take one tablet every other day 30 tablet 2     Assessment: Pharmacy consulted to dose heparin in patient with right leg DVT.  Patient is not on anticoagulation prior to admission.  Hgb 12.2 Platelets 114  Goal of Therapy:  Heparin level 0.3-0.7 units/ml Monitor platelets by anticoagulation protocol: Yes   Plan:  No bolus per MD Start heparin infusion at 1100 units/hr Check anti-Xa level in 8 hours and daily while on heparin Continue to monitor H&H and platelets  Margot Ables, PharmD Clinical Pharmacist 10/17/2022 2:31 PM

## 2022-10-17 NOTE — ED Notes (Signed)
Pt attempted to urinate right when [place] came in and he was unsuccessful.

## 2022-10-17 NOTE — H&P (Signed)
History and Physical      Franklin Gonzalez [id number] DOB: [birth year] DOA: 10/17/2022  PCP: [place], [place]  Patient coming from: home   I have personally briefly reviewed patient's old medical records in [place]  Chief Complaint: Nausea vomiting  HPI: Franklin Gonzalez is a 89 y.o. male with medical history significant for chronic biventricular systolic heart failure with most recent LVEF 20 to 25%, CAD status post CABG in 1993, type 2 diabetes mellitus, send hypertension, paroxysmal SVT, CKD 3B associated baseline creatinine 1.3-1.6, anemia chronic kidney disease associate with hemoglobin range of 10-12, who is admitted to [facility] on 10/17/2022 with acute on chronic biventricular systolic heart failure after presenting from home to [facility] ED complaining of nausea vomiting.   The patient reports persistent nausea over the course the last 1 to 2 days, associated with 2-3 episodes of nonbloody, nonbilious emesis over that timeframe.  Unable to tolerate p.o. over the last day as a consequence of this.  Not associated with any overt abdominal discomfort or any recent diarrhea, melena, or hematochezia.  Denies any significant shortness of breath, but does note some worsening of edema in the bilateral lower extremities.  No associated subjective fever, chills, rigors, generalized measures.  Denies any associated dysuria or gross hematuria.  Medical history notable for chronic biventricular systolic heart failure, most recent echocardiogram in October 2023 demonstrating LVEF 2025%, mildly dilated left ventricular internal cavity size, severely reduced right ventricular systolic function, severely dilated bilateral atria, moderate to severe tricuspid regurgitation.  Outpatient diuretic regimen includes torsemide 20 mg p.o. every 48 hours as well as spironolactone.  He is also on losartan as an outpatient.  His cardiac history is also includes paroxysmal SVT for which she is on  amiodarone.  Of note, not on a beta-blocker as an outpatient.  No history of routine or recent alcohol consumption.  No history of recreational drug use.  Denies any recent use of acetaminophen.  No recent trauma or travel.  In the setting of recurrence of the patient's nausea/vomiting, EMS was contacted, and administered a single dose of Zofran and subsequently brought the patient to [facility] emergency department for further evaluation management of the above.    ED Course:  Vital signs in the ED were notable for the following: Afebrile; heart rate in the 60s; blood pressure 110/70; respiratory rate 14, oxygen saturation 99 to 100% on room air.  Labs were notable for the following: CMP notable for the following: Sodium 138, potassium 5.0, BUN 64, creatinine 1.92 compared to most recent prior value of 1.60 on 06/20/2022, glucose 289, calcium, just for mild hyperammonemia noted to be 8.8, and 3.2, alkaline phosphatase and total bilirubin found to be within normal limits, AST 501 compared 23 June 2022, ALT 43 compared to 30 in October 2013.  Lipase 37.  CBC notable for white cell count 8200, hemoglobin 11.8 compared to 11.9 on 06/20/2022, platelet count 111.  Imaging and additional notable ED work-up: CT abdomen/pelvis with contrast shows marked periportal edema consistent with congestive hepatopathy as well as moderate right-sided and trace left-sided pleural effusion will also showing cholelithiasis in the absence of any evidence of acute cholecystitis nor any evidence of choledocholithiasis or dilated common bile duct.  While in the ED, the following were administered: Zofran 2 mg IV x 1.  Subsequently, the patient was admitted for further evaluation and management of acute on chronic biventricular systolic heart failure after presenting with intractable nausea/vomiting, with laboratory abnormalities that  include acute transaminitis as well as acute kidney injury superimposed on CKD  3B.     Review of Systems: As per HPI otherwise 10 point review of systems negative.   Past Medical History:  Diagnosis Date   Bifascicular block    Coronary artery disease    CABG 1993 at [facility]; NSTEMI 09/2012 s/p DES to SVG-OM1, DES to SVG-PDA    DM (diabetes mellitus) ([place])    HTN (hypertension)    SVT (supraventricular tachycardia)     Past Surgical History:  Procedure Laterality Date   [place] CABG   LEFT HEART CATHETERIZATION WITH CORONARY/GRAFT ANGIOGRAM N/A 10/04/2012   Procedure: LEFT HEART CATHETERIZATION WITH Beatrix Fetters;  Surgeon: Sherren Mocha, MD;  Location: [facility] CATH LAB;  Service: Cardiovascular;  Laterality: N/A;    Social History:  reports that he has never smoked. He has never used smokeless tobacco. He reports that he does not drink alcohol and does not use drugs.   No Known Allergies  Family History  Problem Relation Age of Onset   CAD Other        multiple relatives including father and son    Family history reviewed and not pertinent    Prior to Admission medications   Medication Sig Start Date End Date Taking? Authorizing Provider  amiodarone (PACERONE) 200 MG tablet Take 1/2 (one-half) tablet by mouth once daily 12/22/21   Lelon Perla, MD  ammonium lactate (AMLACTIN) 12 % cream Apply topically 2 (two) times daily. 06/07/22   [provider]  aspirin EC 81 MG EC tablet Take 1 tablet (81 mg total) by mouth daily. 10/07/12   Edmisten, Brooke O, PA-C  atorvastatin (LIPITOR) 10 MG tablet Take 10 mg by mouth at bedtime.    [provider]  dorzolamide-timolol (COSOPT) 22.3-6.8 MG/ML ophthalmic solution Place 1 drop into both eyes 2 (two) times daily. 09/21/21   [provider]  doxazosin (CARDURA) 4 MG tablet Take 2 mg by mouth at bedtime.    [provider]  EUTHYROX 75 MCG tablet Take 75 mcg by mouth daily. 09/01/20   [provider]  glipiZIDE (GLUCOTROL) 5 MG  tablet Take 2.5 mg by mouth daily. 05/11/22   [provider]  latanoprost (XALATAN) 0.005 % ophthalmic solution Place 1 drop into both eyes every evening. 10/17/21   [provider]  levothyroxine (SYNTHROID) 88 MCG tablet Take 88 mcg by mouth daily. 11/15/21   [provider]  losartan (COZAAR) 50 MG tablet Take 1 tablet by mouth once daily 07/12/22   Lelon Perla, MD  nitroGLYCERIN (NITROSTAT) 0.4 MG SL tablet Place 1 tablet (0.4 mg total) under the tongue every 5 (five) minutes x 3 doses as needed for chest pain. 10/07/12   Edmisten, Brooke O, PA-C  potassium chloride (KLOR-CON M) 10 MEQ tablet Take 0.5 tablets (5 mEq total) by mouth daily. 07/09/22   [facility], Tami Lin, PA  spironolactone (ALDACTONE) 25 MG tablet Take 0.5 tablets (12.5 mg total) by mouth daily. 06/21/22   Mercy Riding, MD  torsemide (DEMADEX) 20 MG tablet Take 1 tablet (20 mg total) by mouth daily. Take one tablet every other day 06/20/22   Mercy Riding, MD     Objective    Physical Exam: Vitals:   10/17/22 0023  BP: 110/64  Pulse: (!) 31  Temp: (!) 97.5 F (36.4 C)  TempSrc: Oral  SpO2: 99%  Weight: 65 kg  Height: 5' 11"$  (  1.803 m)    General: appears to be stated age; alert, oriented Skin: warm, dry, no rash Head:  AT/[place] Mouth:  Oral mucosa membranes appear moist, normal dentition Neck: supple; trachea midline Heart:  RRR; did not appreciate any M/R/G Lungs: CTAB, did not appreciate any wheezes, rales, or rhonchi Abdomen: + BS; soft, ND, NT Vascular: 2+ pedal pulses b/l; 2+ radial pulses b/l Extremities: 2+ edema in the bilateral lower extremities, no muscle wasting Neuro: strength and sensation intact in upper and lower extremities b/l    Labs on Admission: I have personally reviewed following labs and imaging studies  CBC: Recent Labs  Lab 10/17/22 0035  WBC 8.2  HGB 11.8*  HCT 36.5*  MCV 97.6  PLT 99991111*   Basic Metabolic Panel: Recent Labs  Lab 10/17/22 0035   NA 138  K 5.0  CL 109  CO2 19*  GLUCOSE 289*  BUN 64*  CREATININE 1.92*  CALCIUM 8.1*   GFR: Estimated Creatinine Clearance: 19.7 mL/min (A) (by C-G formula based on SCr of 1.92 mg/dL (H)). Liver Function Tests: Recent Labs  Lab 10/17/22 0035  AST 501*  ALT 483*  ALKPHOS 117  BILITOT 0.7  PROT 5.5*  ALBUMIN 3.2*   Recent Labs  Lab 10/17/22 0035  LIPASE 37   No results for input(s): "AMMONIA" in the last 168 hours. Coagulation Profile: No results for input(s): "INR", "PROTIME" in the last 168 hours. Cardiac Enzymes: No results for input(s): "CKTOTAL", "CKMB", "CKMBINDEX", "TROPONINI" in the last 168 hours. BNP (last 3 results) No results for input(s): "PROBNP" in the last 8760 hours. HbA1C: No results for input(s): "HGBA1C" in the last 72 hours. CBG: No results for input(s): "GLUCAP" in the last 168 hours. Lipid Profile: No results for input(s): "CHOL", "HDL", "LDLCALC", "TRIG", "CHOLHDL", "LDLDIRECT" in the last 72 hours. Thyroid Function Tests: No results for input(s): "TSH", "T4TOTAL", "FREET4", "T3FREE", "THYROIDAB" in the last 72 hours. Anemia Panel: No results for input(s): "VITAMINB12", "FOLATE", "FERRITIN", "TIBC", "IRON", "RETICCTPCT" in the last 72 hours. Urine analysis:    Component Value Date/Time   COLORURINE STRAW (A) 06/16/2022 1548   APPEARANCEUR CLEAR 06/16/2022 1548   LABSPEC 1.008 06/16/2022 1548   PHURINE 5.0 06/16/2022 [employer] 06/16/2022 1548   HGBUR NEGATIVE 06/16/2022 [place] 06/16/2022 1548   KETONESUR NEGATIVE 06/16/2022 1548   PROTEINUR NEGATIVE 06/16/2022 1548   UROBILINOGEN 1.0 10/03/2012 1151   NITRITE NEGATIVE 06/16/2022 1548   LEUKOCYTESUR SMALL (A) 06/16/2022 1548    Radiological Exams on Admission: CT ABDOMEN PELVIS W CONTRAST  Result Date: 10/17/2022 CLINICAL DATA:  Abdominal pain, acute, nonlocalized, vomiting EXAM: CT ABDOMEN AND PELVIS WITH CONTRAST TECHNIQUE: Multidetector CT  imaging of the abdomen and pelvis was performed using the standard protocol following bolus administration of intravenous contrast. RADIATION DOSE REDUCTION: This exam was performed according to the departmental dose-optimization program which includes automated exposure control, adjustment of the mA and/or kV according to patient size and/or use of iterative reconstruction technique. CONTRAST:  72m OMNIPAQUE IOHEXOL 300 MG/ML  SOLN COMPARISON:  None Available. FINDINGS: Lower chest: Mild cardiomegaly. Extensive coronary artery calcification. Moderate right and trace left pleural effusions are present with associated bibasilar compressive atelectasis. Small hiatal hernia. Hepatobiliary: There is marked periportal edema which may relate to congestive hepatopathy, but is nonspecific. Scattered hypodensities within the liver are nonspecific, possibly representing small hepatic cysts or hemangioma in a patient without a history of malignancy. No enhancing intrahepatic mass. No intra or extrahepatic  biliary ductal dilation. Cholelithiasis noted. Pancreas: Innumerable cystic lesions arise from the body and tail the pancreas likely representing multiple dilated pancreatic side branches or, less likely, multifocal intraductal pancreatic mucinous neoplasm. The largest cystic lesion measures 2.6 cm at axial image # 17/2. No peripancreatic inflammatory changes identified. Spleen: Unremarkable Adrenals/Urinary Tract: The adrenal glands are unremarkable. The kidneys are normal in size and position. There is a 17 mm hyperdense lesion arising from the lower pole of the left kidney measuring 78 Hounsfield units on early arterial phase imaging and 97 Hounsfield units on delayed phase imaging in keeping with a primary renal neoplasm. No hydronephrosis. No intrarenal or ureteral calculi. The bladder is unremarkable. Stomach/Bowel: Mild ascites. The stomach, small bowel, and large bowel are otherwise unremarkable. The appendix is  normal. No free intraperitoneal gas. Vascular/Lymphatic: No pathologic adenopathy within the abdomen and pelvis. Extensive aortoiliac atherosclerotic calcification. No aortic aneurysm. Reproductive: Marked prostatic enlargement. Other: Small bilateral inguinal hernias are present. There is mild diffuse subcutaneous body wall edema. Musculoskeletal: Osseous structures are diffusely osteopenic. No acute bone abnormality. Mild remote appearing compression deformity of L1. IMPRESSION: 1. No acute intra-abdominal pathology identified. No definite radiographic explanation for the patient's reported symptoms. 2. Mild cardiomegaly. Extensive coronary artery calcification. 3. Moderate right and trace left pleural effusions, mild ascites, and mild diffuse subcutaneous body wall edema in keeping with mild anasarca. 4. Marked periportal edema, nonspecific, but possibly related to congestive hepatopathy given the above findings. Acute inflammatory conditions of the liver or biliary tree, however, could appear similarly and correlation with liver enzymes is recommended. 5. Cholelithiasis. 6. Innumerable cystic lesions arising from the body and tail the pancreas likely representing multiple dilated pancreatic side branches or, less likely, multifocal intraductal pancreatic mucinous neoplasm. The largest cystic lesion measures 2.6 cm. If clinically indicated, follow-up MRI examination in 6 months is recommended to document stability. 7. 17 mm hyperdense lesion arising from the lower pole of the left kidney in keeping with a primary renal neoplasm. Urologic consultation may be helpful for further management. 8. Marked prostatic enlargement. Aortic Atherosclerosis (ICD10-I70.0). Electronically Signed   By: Fidela Salisbury M.D.   On: 10/17/2022 02:01      Assessment/Plan   Principal Problem:   Acute on chronic systolic CHF (congestive heart failure) (HCC) Active Problems:   Essential hypertension   DM2 (diabetes mellitus,  type 2) (HCC)   Intractable nausea and vomiting   Transaminitis   Acute renal failure superimposed on stage 3b chronic kidney disease (HCC)   Acquired hypothyroidism   History of anemia due to chronic kidney disease     #) Acute on chronic biventricular systolic heart failure: In context of a documented history of chronic biventricular systolic heart failure, with most recent echocardiogram in October 2023 notable for LVEF 20 to 25% as well as severely reduced right ventricular systolic function, suspect acute exacerbation thereof, resulting in congestive hepatopathy contributing to his presenting intractable nausea/vomiting over the last few days, noting that CT abdomen/pelvis shows evidence consistent with congestive hepatopathy including significant periportal edema.  Does not appear to be associated any acute respiratory symptoms at this time will further evaluate with chest x-ray, BMP, and EKG.  IV diuresis efforts are complicated by increased risk for preload dependent pathophysiology given the significant nature of the patient's right-sided systolic heart failure.  Will provide a single dose of IV Lasix at this time, with close monitoring in setting my assessment and responsive vital signs to assist with guidance of additional diuresis approach.  Plan: Lasix 40 mg IV x 1 now.  Monitor telemetry.  Monitor strict I's and O's and weights.  Add on serum magnesium level.  Pete CMP in the morning.  Hold home torsemide and spironolactone for now.  Repeat CMP in the morning.  EKG, BNP, chest x-ray.          #) Acute transaminitis: Significant elevation in AST/ALT relative to most recent prior transaminases drawn in October 2023, in the absence of any elevation of total bilirubin or alkaline phosphatase to suggest any contribution from cholestatic process.  Given radiographic findings suggestive of congestive hepatopathy, suspect that congestive hepatopathy represents the source of this interval  increase in transaminases noting admitting diagnosis of acute on chronic biventricular systolic heart failure, inconsistent with interval elevation in BUN as a prognostic indicator for his acute on chronic systolic heart failure.  CT abdomen/pelvis shows no evidence of acute gallbladder pathology, including no evidence of acute cholecystitis nor any radiographic evidence of biliary obstruction.  Will expand laboratory evaluation and also pursue upper quadrant ultrasound, while focusing additional workup and management on suspected contributory acute on chronic biventricular systolic heart failure, as further detailed above.  Differential also includes possibility of hepatotoxic medications of amiodarone, although this appears less likely relative to aforementioned congestive hepatopathy.  Plan: Upper quadrant ultrasound as above.  Repeat CMP in the morning.  IV diuresis, as above.  Check INR, acetaminophen level, viral hepatitis panel.              #) Acute Kidney Injury:  as quantified above.  Suspect that this is prerenal in nature as a consequence of diminished renal perfusion gradient as a result of acute on chronic biventricular systolic heart failure, as above.  Potential pharmacologic exacerbation stemming from home losartan.  Will also hold home glipizide for now.  Of note, CT abdomen/pelvis showed no evidence of postrenal obstructive source.   Plan: monitor strict I's & O's and daily weights. Attempt to avoid nephrotoxic agents.  Hold home losartan.Marland Kitchen Refrain from NSAIDs. Repeat CMP in the morning. Check serum magnesium level.  Check urinalysis with microscopy add-on random urine sodium and random urine creatinine.  Further evaluation management of suspected contributory acute on chronic biventricular systolic heart failure, including IV diuresis, as above.              #) Type 2 Diabetes Mellitus: documented history of such. Home insulin regimen: None. Home oral hypoglycemic  agents: Glipizide. presenting blood sugar: 289. Most recent A1c noted to be 8.4% when checked in October 2023.   Plan: accuchecks QAC and HS with low dose SSI. hold home oral hypoglycemic agents during this hospitalization.               #) Essential Hypertension: documented h/o such, with outpatient antihypertensive regimen including losartan, doxazosin, spironolactone, torsemide.  SBP's in the ED today: In the 110s mmHg.   Plan: Close monitoring of subsequent BP via routine VS. the setting of AKI on CKD 3B, will hold home losartan for now.  Additionally, we will be pursuing IV diuresis.  Consequently, will hold home torsemide a.m. spironolactone for now.  In order to prevent hypotension, also hold home doxazosin, closely monitoring and setting hemodynamic response to aforementioned IV diuresis efforts.             #) acquired hypothyroidism: documented h/o such, on Synthroid as outpatient.   Plan: cont home Synthroid.  In the setting of intractable nausea/vomiting, will also check TSH.             #)  Anemia of chronic kidney disease: Documented history of such, a/w with baseline hgb range 10-12, with presenting hgb consistent with this range, in the absence of any overt evidence of active bleed.     Plan: Repeat CBC in the morning.  Check INR.          DVT prophylaxis: SCD's   Code Status: DNR (consistent with CODE STATUS documentation at times of prior hospitalizations, and consistent with patient's conveyance today).  Disposition Plan: Per Rounding Team Consults called: none;  Admission status: obs     I SPENT GREATER THAN 75  MINUTES IN CLINICAL CARE TIME/MEDICAL DECISION-MAKING IN COMPLETING THIS ADMISSION.      Luna DO [facility]  From [place]   10/17/2022, 3:02 AM

## 2022-10-17 NOTE — Progress Notes (Addendum)
PROGRESS NOTE     Franklin Gonzalez, is a 89 y.o. male, DOB - Jul 06, 1922, [id number]  Admit date - 10/17/2022   Admitting Physician Rhetta Mura, DO  Outpatient Primary MD for the patient is [place] Nation, MD  LOS - 0  Chief Complaint  Patient presents with   Emesis        Brief Narrative:  89 y.o. male with medical history significant for chronic biventricular systolic heart failure with most recent LVEF 20 to 25%, CAD status post CABG in 1993, type 2 diabetes mellitus, send hypertension, paroxysmal SVT, CKD 3B associated baseline creatinine 1.3-1.6, anemia chronic kidney disease associate with hemoglobin range of 10-12, who is admitted to [facility] on 10/17/2022 with acute on chronic biventricular combined diastolic and systolic heart failure and found to have right lower extremity DVT, acute exacerbation of CHF, and acute calculus cholecystitis with significant LFT elevation   -Assessment and Plan: 1) acute calculus cholecystitis----abdominal ultrasound findings noted -Elevated LFTs noted -Empiric Rocephin and Flagyl -Antiemetics as needed pain medications as ordered -GI and general surgery consult requested  2)acute on chronic biventricular combined diastolic and systolic heart failure--- in the setting of acute right lower extremity DVT and acute calculus cholecystitis -Echo from October 2023 with EF of 20 to 25% -Chest x-ray consistent with CHF with bilateral pleural effusions right greater than left BNP 3.338 -BP too soft to continue IV bolus Lasix -Will start IV Lasix drip and titrate up as BP allows -Repeat echo requested  3)Acute Rt LE DVT--- start IV heparin drip -Chronic anemia and chronic thrombocytopenia noted -Watch closely for bleeding  4)Acute Transaminitis--- suspect secondary to #1 above -Decompensated CHF with hepatic congestion may be contributory -Acute viral hepatitis profile is negative -Serum acetaminophen is not elevated      Latest Ref Rng & Units 10/17/2022    5:53 AM 10/17/2022   12:35 AM 06/20/2022    4:01 AM  Hepatic Function  Total Protein 6.5 - 8.1 g/dL 5.5  5.5    Albumin 3.5 - 5.0 g/dL 3.3  3.2  2.7   AST 15 - 41 U/L 366  501    ALT 0 - 44 U/L 471  483    Alk Phosphatase 38 - 126 U/L 112  117    Total Bilirubin 0.3 - 1.2 mg/dL 0.6  0.7     5)AKI----acute kidney injury on CKD stage -3B -Creatinine trending up renally adjust medications, avoid nephrotoxic agents / dehydration  / hypotension  6)DM2-A1c 8.4 in October 2023 reflecting uncontrolled diabetes with hyperglycemia Use Novolog/Humalog Sliding scale insulin with Accu-Cheks/Fingersticks as ordered   7) chronic anemia/Chronic Thrombocytopenia--- suspect related to underlying CKD --Hgb currently close to baseline  8)Hypothyroidism-- -continue levothyroxine -TSH  9)Social/Ethics--- plan of care discussed with patient's son [name] and patient's daughter-in-law at bedside -Questions answered -Remains a DNR  10) acute hypoxic respiratory failure secondary to CHF exacerbation--- -continue supplement oxygen at 3L/min  -Total care time over 53 minutes including time spent coordinating care with patient's son and daughter-in-law at bedside  Status is: Inpatient   Disposition: The patient is from: Home              Anticipated d/c is to:  TBD .... ???? SNF              Anticipated d/c date is: > 3 days              Patient currently is not medically stable to d/c.  Barriers: Not Clinically Stable-   Code Status :  -  Code Status: DNR   Family Communication:    plan of care discussed with patient's son [name] and patient's daughter-in-law at bedside  DVT Prophylaxis  :   -Iv Heparin  Lab Results  Component Value Date   PLT 114 (L) 10/17/2022   Inpatient Medications  Scheduled Meds:  amiodarone  100 mg Oral Daily   aspirin EC  81 mg Oral Daily   insulin aspart  0-6 Units Subcutaneous TID WC   levothyroxine   88 mcg Oral QAC breakfast   Continuous Infusions:  cefTRIAXone (ROCEPHIN)  IV     heparin 1,100 Units/hr (10/17/22 1534)   metronidazole     PRN Meds:.acetaminophen **OR** acetaminophen, fentaNYL (SUBLIMAZE) injection, melatonin, naLOXone (NARCAN)  injection, ondansetron (ZOFRAN) IV   Anti-infectives (From admission, onward)    Start     Dose/Rate Route Frequency Ordered Stop   10/17/22 1745  cefTRIAXone (ROCEPHIN) 2 g in sodium chloride 0.9 % 100 mL IVPB        2 g 200 mL/hr over 30 Minutes Intravenous Every 24 hours 10/17/22 1654     10/17/22 1745  metroNIDAZOLE (FLAGYL) IVPB 500 mg        500 mg 100 mL/hr over 60 Minutes Intravenous Every 12 hours 10/17/22 1654         Subjective: Franklin Gonzalez today has no fevers, no further emesis,  No chest pain,    Shortness of breath persist -Son and daughter-in-law at bedside -Right-sided abdominal pain is not worse   Objective: Vitals:   10/17/22 0900 10/17/22 0908 10/17/22 1023 10/17/22 1408  BP: 106/73  (!) 103/56 108/72  Pulse: (!) 49  71 (!) 55  Resp: 11  16 18  $ Temp:  (!) 97.5 F (36.4 C) 97.6 F (36.4 C) (!) 97.5 F (36.4 C)  TempSrc:  Oral Oral Oral  SpO2: 100%  100% 100%  Weight:      Height:        Intake/Output Summary (Last 24 hours) at 10/17/2022 1655 Last data filed at 10/17/2022 0333 Gross per 24 hour  Intake 500 ml  Output --  Net 500 ml   Filed Weights   10/17/22 0023  Weight: 65 kg    Physical Exam  Gen:- Awake Alert,  in no apparent distress  HEENT:- [place].AT, No sclera icterus Nose- [place] 3L/min Neck-Supple Neck,No JVD,.  Lungs-diminished breath sounds, bibasilar rales  CV- S1, S2 normal, regular , CABG Abd-  +ve B.Sounds, Abd Soft, right upper quadrant and epigastric tenderness, no rebound or guarding Extremity/Skin:- +ve  edema (left leg with scar from prior vein advised for CABG, right leg is more swollen than left with pitting edema,, positive Homans on the right, pedal pulses present   Psych-affect is appropriate, oriented x3 Neuro-generalized weakness, no new focal deficits, no tremors  Data Reviewed: I have personally reviewed following labs and imaging studies  CBC: Recent Labs  Lab 10/17/22 0035 10/17/22 0553  WBC 8.2 7.8  NEUTROABS  --  6.2  HGB 11.8* 12.2*  HCT 36.5* 38.0*  MCV 97.6 97.4  PLT 111* [id number]*   Basic Metabolic Panel: Recent Labs  Lab 10/17/22 0035 10/17/22 0553  NA 138 137  K 5.0 5.0  CL 109 109  CO2 19* 20*  GLUCOSE 289* 261*  BUN 64* 66*  CREATININE 1.92* 1.95*  CALCIUM 8.1* 8.6*  MG  --  2.6*   GFR: Estimated Creatinine Clearance: 19.4 mL/min (  A) (by C-G formula based on SCr of 1.95 mg/dL (H)). Liver Function Tests: Recent Labs  Lab 10/17/22 0035 10/17/22 0553  AST 501* 366*  ALT 483* 471*  ALKPHOS 117 112  BILITOT 0.7 0.6  PROT 5.5* 5.5*  ALBUMIN 3.2* 3.3*   Radiology Studies: US Venous Img Lower Bilateral (DVT)  Result Date: 10/17/2022 CLINICAL DATA:  Bilateral lower extremity edema. EXAM: BILATERAL LOWER EXTREMITY VENOUS DOPPLER ULTRASOUND TECHNIQUE: Gray-scale sonography with graded compression, as well as color Doppler and duplex ultrasound were performed to evaluate the lower extremity deep venous systems from the level of the common femoral vein and including the common femoral, femoral, profunda femoral, popliteal and calf veins including the posterior tibial, peroneal and gastrocnemius veins when visible. The superficial great saphenous vein was also interrogated. Spectral Doppler was utilized to evaluate flow at rest and with distal augmentation maneuvers in the common femoral, femoral and popliteal veins. COMPARISON:  Right lower extremity venous Doppler ultrasound 06/16/2022 FINDINGS: RIGHT LOWER EXTREMITY Common Femoral Vein: No evidence of thrombus. Normal compressibility, respiratory phasicity and response to augmentation. Saphenofemoral Junction: No evidence of thrombus. Normal compressibility and flow on color  Doppler imaging. Profunda Femoral Vein: No evidence of thrombus. Normal compressibility and flow on color Doppler imaging. Femoral Vein: No evidence of thrombus. Normal compressibility, respiratory phasicity and response to augmentation. Popliteal Vein: Noncompressible with nonocclusive thrombus. Calf Veins: No evidence of thrombus. Normal compressibility and flow on color Doppler imaging. Superficial Great Saphenous Vein: No evidence of thrombus. Normal compressibility. Venous Reflux:  None. Other Findings:  None. LEFT LOWER EXTREMITY Common Femoral Vein: No evidence of thrombus. Normal compressibility, respiratory phasicity and response to augmentation. Saphenofemoral Junction: No evidence of thrombus. Normal compressibility and flow on color Doppler imaging. Profunda Femoral Vein: No evidence of thrombus. Normal compressibility and flow on color Doppler imaging. Femoral Vein: No evidence of thrombus. Normal compressibility, respiratory phasicity and response to augmentation. Popliteal Vein: No evidence of thrombus. Normal compressibility, respiratory phasicity and response to augmentation. Calf Veins: No evidence of thrombus. Normal compressibility and flow on color Doppler imaging. Superficial Great Saphenous Vein: No evidence of thrombus. Normal compressibility. Venous Reflux:  None. Other Findings:  None. IMPRESSION: 1. Positive for nonocclusive DVT in the right popliteal vein. 2. No evidence of DVT in the left lower extremity. Electronically Signed   By: Logan Bores M.D.   On: 10/17/2022 11:35   US Abdomen Limited RUQ (LIVER/GB)  Result Date: 10/17/2022 CLINICAL DATA:  Nausea and vomiting EXAM: ULTRASOUND ABDOMEN LIMITED RIGHT UPPER QUADRANT COMPARISON:  CT scan of the abdomen and pelvis performed earlier today FINDINGS: Gallbladder: Echogenic material within the gallbladder lumen consistent with cholelithiasis. There is evidence of posterior acoustic shadowing. The gallbladder wall is diffusely  thickened measuring up to 7 mm. Per the sonographer, the sonographic Percell Miller sign was negative. Trace pericholecystic fluid is visualized. Common bile duct: Diameter: Normal at 6 mm. Liver: No focal lesion identified. Within normal limits in parenchymal echogenicity. Portal vein is patent on color Doppler imaging with normal direction of blood flow towards the liver. Other: None. IMPRESSION: Cholelithiasis with diffuse gallbladder wall thickening and pericholecystic fluid. In the appropriate clinical setting, findings are concerning for acute calculus cholecystitis. Electronically Signed   By: Jacqulynn Cadet M.D.   On: 10/17/2022 09:04   DG Chest Port 1 View  Result Date: 10/17/2022 CLINICAL DATA:  Congestive heart failure. EXAM: PORTABLE CHEST 1 VIEW COMPARISON:  06/16/2022 FINDINGS: 0645 hours. The cardio pericardial silhouette is enlarged. Interstitial markings  are diffusely coarsened with chronic features. Bibasilar atelectasis with small bilateral pleural effusions evident. Airspace disease at the right base not excluded. Bones are diffusely demineralized. Telemetry leads overlie the chest. IMPRESSION: Bibasilar atelectasis with small bilateral pleural effusions, right greater than left. Right basilar pneumonia cannot be excluded. Electronically Signed   By: Misty Stanley M.D.   On: 10/17/2022 06:55   CT ABDOMEN PELVIS W CONTRAST  Result Date: 10/17/2022 CLINICAL DATA:  Abdominal pain, acute, nonlocalized, vomiting EXAM: CT ABDOMEN AND PELVIS WITH CONTRAST TECHNIQUE: Multidetector CT imaging of the abdomen and pelvis was performed using the standard protocol following bolus administration of intravenous contrast. RADIATION DOSE REDUCTION: This exam was performed according to the departmental dose-optimization program which includes automated exposure control, adjustment of the mA and/or kV according to patient size and/or use of iterative reconstruction technique. CONTRAST:  56m OMNIPAQUE IOHEXOL 300  MG/ML  SOLN COMPARISON:  None Available. FINDINGS: Lower chest: Mild cardiomegaly. Extensive coronary artery calcification. Moderate right and trace left pleural effusions are present with associated bibasilar compressive atelectasis. Small hiatal hernia. Hepatobiliary: There is marked periportal edema which may relate to congestive hepatopathy, but is nonspecific. Scattered hypodensities within the liver are nonspecific, possibly representing small hepatic cysts or hemangioma in a patient without a history of malignancy. No enhancing intrahepatic mass. No intra or extrahepatic biliary ductal dilation. Cholelithiasis noted. Pancreas: Innumerable cystic lesions arise from the body and tail the pancreas likely representing multiple dilated pancreatic side branches or, less likely, multifocal intraductal pancreatic mucinous neoplasm. The largest cystic lesion measures 2.6 cm at axial image # 17/2. No peripancreatic inflammatory changes identified. Spleen: Unremarkable Adrenals/Urinary Tract: The adrenal glands are unremarkable. The kidneys are normal in size and position. There is a 17 mm hyperdense lesion arising from the lower pole of the left kidney measuring 78 Hounsfield units on early arterial phase imaging and 97 Hounsfield units on delayed phase imaging in keeping with a primary renal neoplasm. No hydronephrosis. No intrarenal or ureteral calculi. The bladder is unremarkable. Stomach/Bowel: Mild ascites. The stomach, small bowel, and large bowel are otherwise unremarkable. The appendix is normal. No free intraperitoneal gas. Vascular/Lymphatic: No pathologic adenopathy within the abdomen and pelvis. Extensive aortoiliac atherosclerotic calcification. No aortic aneurysm. Reproductive: Marked prostatic enlargement. Other: Small bilateral inguinal hernias are present. There is mild diffuse subcutaneous body wall edema. Musculoskeletal: Osseous structures are diffusely osteopenic. No acute bone abnormality. Mild  remote appearing compression deformity of L1. IMPRESSION: 1. No acute intra-abdominal pathology identified. No definite radiographic explanation for the patient's reported symptoms. 2. Mild cardiomegaly. Extensive coronary artery calcification. 3. Moderate right and trace left pleural effusions, mild ascites, and mild diffuse subcutaneous body wall edema in keeping with mild anasarca. 4. Marked periportal edema, nonspecific, but possibly related to congestive hepatopathy given the above findings. Acute inflammatory conditions of the liver or biliary tree, however, could appear similarly and correlation with liver enzymes is recommended. 5. Cholelithiasis. 6. Innumerable cystic lesions arising from the body and tail the pancreas likely representing multiple dilated pancreatic side branches or, less likely, multifocal intraductal pancreatic mucinous neoplasm. The largest cystic lesion measures 2.6 cm. If clinically indicated, follow-up MRI examination in 6 months is recommended to document stability. 7. 17 mm hyperdense lesion arising from the lower pole of the left kidney in keeping with a primary renal neoplasm. Urologic consultation may be helpful for further management. 8. Marked prostatic enlargement. Aortic Atherosclerosis (ICD10-I70.0). Electronically Signed   By: AFidela SalisburyM.D.   On: 10/17/2022 02:01  Scheduled Meds:  amiodarone  100 mg Oral Daily   aspirin EC  81 mg Oral Daily   insulin aspart  0-6 Units Subcutaneous TID WC   levothyroxine  88 mcg Oral QAC breakfast   Continuous Infusions:  cefTRIAXone (ROCEPHIN)  IV     heparin 1,100 Units/hr (10/17/22 1534)   metronidazole      LOS: 0 days   Roxan Hockey M.D on 10/17/2022 at 4:55 PM  Go to [email or web address] - for contact info  [facility] - Office  [phone]  If 7PM-7AM, please contact night-coverage [email or web address] 10/17/2022, 4:55 PM

## 2022-10-17 NOTE — Care Management Obs Status (Signed)
[facility] NOTIFICATION   Patient Details  Name: Franklin Gonzalez MRN: [id number] Date of Birth: 1921-12-05   Medicare Observation Status Notification Given:  Yes    Salome Arnt, [place] 10/17/2022, 11:55 AM

## 2022-10-17 NOTE — ED Triage Notes (Addendum)
Pt from home with Nausea and Vomiting that started this evening. Pt reported he vomited 3 times. EMS gave 59m Zofran en route. Pt reportedly had [employer] around 1730 yesterday. States he feels better now.

## 2022-10-18 ENCOUNTER — Inpatient Hospital Stay (HOSPITAL_COMMUNITY): Payer: Medicare Other

## 2022-10-18 ENCOUNTER — Other Ambulatory Visit (HOSPITAL_COMMUNITY): Payer: Self-pay | Admitting: *Deleted

## 2022-10-18 ENCOUNTER — Encounter (HOSPITAL_COMMUNITY): Payer: Self-pay | Admitting: Family Medicine

## 2022-10-18 DIAGNOSIS — I5023 Acute on chronic systolic (congestive) heart failure: Secondary | ICD-10-CM | POA: Diagnosis not present

## 2022-10-18 DIAGNOSIS — R0609 Other forms of dyspnea: Secondary | ICD-10-CM | POA: Diagnosis not present

## 2022-10-18 DIAGNOSIS — K8020 Calculus of gallbladder without cholecystitis without obstruction: Secondary | ICD-10-CM

## 2022-10-18 DIAGNOSIS — B179 Acute viral hepatitis, unspecified: Secondary | ICD-10-CM

## 2022-10-18 DIAGNOSIS — Z01810 Encounter for preprocedural cardiovascular examination: Secondary | ICD-10-CM

## 2022-10-18 DIAGNOSIS — R112 Nausea with vomiting, unspecified: Secondary | ICD-10-CM | POA: Diagnosis not present

## 2022-10-18 DIAGNOSIS — R7401 Elevation of levels of liver transaminase levels: Secondary | ICD-10-CM | POA: Diagnosis not present

## 2022-10-18 LAB — COMPREHENSIVE METABOLIC PANEL
ALT: 279 U/L — ABNORMAL HIGH (ref 0–44)
AST: 103 U/L — ABNORMAL HIGH (ref 15–41)
Albumin: 3 g/dL — ABNORMAL LOW (ref 3.5–5.0)
Alkaline Phosphatase: 93 U/L (ref 38–126)
Anion gap: 9 (ref 5–15)
BUN: 58 mg/dL — ABNORMAL HIGH (ref 8–23)
CO2: 19 mmol/L — ABNORMAL LOW (ref 22–32)
Calcium: 8.4 mg/dL — ABNORMAL LOW (ref 8.9–10.3)
Chloride: 111 mmol/L (ref 98–111)
Creatinine, Ser: 1.68 mg/dL — ABNORMAL HIGH (ref 0.61–1.24)
GFR, Estimated: 37 mL/min — ABNORMAL LOW (ref >60)
Glucose, Bld: 89 mg/dL (ref 70–99)
Potassium: 4.2 mmol/L (ref 3.5–5.1)
Sodium: 139 mmol/L (ref 135–145)
Total Bilirubin: 0.7 mg/dL (ref 0.3–1.2)
Total Protein: 5.2 g/dL — ABNORMAL LOW (ref 6.5–8.1)

## 2022-10-18 LAB — ECHOCARDIOGRAM COMPLETE
Area-P 1/2: 2.73 cm2
Calc EF: 23.1 %
Est EF: 20
Height: 71 in
MV M vel: 3.88 m/s
MV Peak grad: 60.2 mmHg
S' Lateral: 6 cm
Single Plane A2C EF: 27.3 %
Single Plane A4C EF: 21.1 %
Weight: 2469.15 oz

## 2022-10-18 LAB — HEPARIN LEVEL (UNFRACTIONATED): Heparin Unfractionated: 0.63 IU/mL (ref 0.30–0.70)

## 2022-10-18 LAB — CBC
HCT: 38 % — ABNORMAL LOW (ref 39.0–52.0)
Hemoglobin: 12.2 g/dL — ABNORMAL LOW (ref 13.0–17.0)
MCH: 31.5 pg (ref 26.0–34.0)
MCHC: 32.1 g/dL (ref 30.0–36.0)
MCV: 98.2 fL (ref 80.0–100.0)
Platelets: 114 10*3/uL — ABNORMAL LOW (ref 150–400)
RBC: 3.87 MIL/uL — ABNORMAL LOW (ref 4.22–5.81)
RDW: 15 % (ref 11.5–15.5)
WBC: 6.7 10*3/uL (ref 4.0–10.5)
nRBC: 0 % (ref 0.0–0.2)

## 2022-10-18 LAB — GLUCOSE, CAPILLARY
Glucose-Capillary: 58 mg/dL — ABNORMAL LOW (ref 70–99)
Glucose-Capillary: 96 mg/dL (ref 70–99)
Glucose-Capillary: 133 mg/dL — ABNORMAL HIGH (ref 70–99)
Glucose-Capillary: 186 mg/dL — ABNORMAL HIGH (ref 70–99)
Glucose-Capillary: 198 mg/dL — ABNORMAL HIGH (ref 70–99)

## 2022-10-18 NOTE — Progress Notes (Signed)
[facility]  Hida scan unable to be done today. Will be done first thing in Am. Npo midnight, no opiates after midnight.  RN updating patient and family. Curlene Labrum, MD

## 2022-10-18 NOTE — Progress Notes (Signed)
PROGRESS NOTE     Franklin Gonzalez, is a 89 y.o. male, DOB - Apr 08, 1922, [id number]  Admit date - 10/17/2022   Admitting Physician Franklin Howald Denton Brick, MD  Outpatient Primary MD for the patient is Franklin Nation, MD  LOS - 1  Chief Complaint  Patient presents with   Emesis        Brief Narrative:  89 y.o. male with medical history significant for chronic biventricular systolic heart failure with most recent LVEF 20 to 25%, CAD status post CABG in 1993, type 2 diabetes mellitus, send hypertension, paroxysmal SVT, CKD 3B associated baseline creatinine 1.3-1.6, anemia chronic kidney disease associate with hemoglobin range of 10-12, who is admitted to [facility] on 10/17/2022 with acute on chronic biventricular combined diastolic and systolic heart failure and found to have right lower extremity DVT, acute exacerbation of CHF, and acute calculus cholecystitis with significant LFT elevation   -Assessment and Plan: 1) acute calculus cholecystitis----abdominal ultrasound findings noted -Elevated LFTs noted--??  If he passed a stone however alk phos and T. bili not elevated -Continue Rocephin and Flagyl -Antiemetics as needed pain medications as ordered 10/18/22 -Discussed with-GI and general surgery teams, official consult pending  -HIDA scan pending  2)acute on chronic biventricular combined diastolic and systolic heart failure--- in the setting of acute right lower extremity DVT and acute calculus cholecystitis -Echo from October 2023 with EF of 20 to 25% -Chest x-ray consistent with CHF with bilateral pleural effusions right greater than left BNP 3.338 -BP too soft to continue IV bolus Lasix - IV Lasix drip and titrate up as BP allows -10/18/22 -Discussed with cardiology team, official cardiology consult appreciated -Repeat echo pending -Continue to hold Aldactone and losartan  3)Acute Rt LE DVT--- start IV heparin drip -Chronic anemia and chronic thrombocytopenia  noted---  appear stable -Watch closely for bleeding  4)Acute Transaminitis--- suspect secondary to #1 above -Decompensated CHF with hepatic congestion may be contributory --??  If he passed a gall stone however alk phos and T. bili not elevated -Acute viral hepatitis profile is negative -Serum acetaminophen is not elevated     Latest Ref Rng & Units 10/18/2022    4:44 AM 10/17/2022    5:53 AM 10/17/2022   12:35 AM  Hepatic Function  Total Protein 6.5 - 8.1 g/dL 5.2  5.5  5.5   Albumin 3.5 - 5.0 g/dL 3.0  3.3  3.2   AST 15 - 41 U/L 103  366  501   ALT 0 - 44 U/L 279  471  483   Alk Phosphatase 38 - 126 U/L 93  112  117   Total Bilirubin 0.3 - 1.2 mg/dL 0.7  0.6  0.7   10/18/22 -LFTs starting to trend down  5)AKI----acute kidney injury on CKD stage -3B renally adjust medications, avoid nephrotoxic agents / dehydration  / hypotension 10/18/22 -Creatinine peaked at 1.95 currently trending back down -Voiding well  6)DM2-A1c 8.4 in October 2023 reflecting uncontrolled diabetes with hyperglycemia Use Novolog/Humalog Sliding scale insulin with Accu-Cheks/Fingersticks as ordered   7) chronic anemia/Chronic Thrombocytopenia--- suspect related to underlying CKD --Hgb and platelets currently close to baseline  8)Hypothyroidism-- -continue levothyroxine -TSH 0.67  9)Social/Ethics--- plan of care discussed with patient's son [name] and patient's daughter-in-law at bedside -Questions answered -Remains a DNR  10) acute hypoxic respiratory failure secondary to CHF exacerbation--- -continue supplement oxygen at 3L/min -Try to wean off oxygen after  additional diuresis after  Status is: Inpatient   Disposition: The  patient is from: Home              Anticipated d/c is to:  TBD .... ???? SNF              Anticipated d/c date is: > 3 days              Patient currently is not medically stable to d/c. Barriers: Not Clinically Stable-   Code Status :  -  Code Status: DNR    Family Communication:    plan of care discussed with patient's son [name] and patient's daughter-in-law at bedside  DVT Prophylaxis  :   -Iv Heparin  Lab Results  Component Value Date   PLT 114 (L) 10/18/2022   Inpatient Medications  Scheduled Meds:  amiodarone  100 mg Oral Daily   aspirin EC  81 mg Oral Daily   insulin aspart  0-6 Units Subcutaneous TID WC   levothyroxine  88 mcg Oral QAC breakfast   Continuous Infusions:  sodium chloride 10 mL/hr at 10/17/22 2149   cefTRIAXone (ROCEPHIN)  IV 2 g (10/17/22 2117)   furosemide (LASIX) 200 mg in dextrose 5 % 100 mL (2 mg/mL) infusion 4 mg/hr (10/17/22 2255)   heparin 1,100 Units/hr (10/17/22 2300)   metronidazole 500 mg (10/18/22 0602)   PRN Meds:.sodium chloride, acetaminophen **OR** acetaminophen, fentaNYL (SUBLIMAZE) injection, melatonin, naLOXone (NARCAN)  injection, ondansetron (ZOFRAN) IV   Anti-infectives (From admission, onward)    Start     Dose/Rate Route Frequency Ordered Stop   10/17/22 1800  cefTRIAXone (ROCEPHIN) 2 g in sodium chloride 0.9 % 100 mL IVPB        2 g 200 mL/hr over 30 Minutes Intravenous Every 24 hours 10/17/22 1654     10/17/22 1800  metroNIDAZOLE (FLAGYL) IVPB 500 mg        500 mg 100 mL/hr over 60 Minutes Intravenous Every 12 hours 10/17/22 1654         Subjective: Franklin Gonzalez today has no fevers, no further emesis,  No chest pain,    -Son at bedside -No postprandial nausea or abdominal pain after eating breakfast -Awaiting HIDA scan later today -Dyspnea is not worse -Voiding well   Objective: Vitals:   10/17/22 2010 10/18/22 0438 10/18/22 0500 10/18/22 0555  BP: (!) 113/57 105/68    Pulse: 85 (!) 137 (!) 57   Resp: 18 17    Temp: 97.6 F (36.4 C) 97.7 F (36.5 C)    TempSrc: Oral Oral    SpO2: 100% 100%    Weight:    70 kg  Height:        Intake/Output Summary (Last 24 hours) at 10/18/2022 1230 Last data filed at 10/18/2022 1045 Gross per 24 hour   Intake 707.67 ml  Output 1250 ml  Net -542.33 ml   Filed Weights   10/17/22 0023 10/18/22 0555  Weight: 65 kg 70 kg    Physical Exam  Gen:- Awake Alert,  in no apparent distress  HEENT:- [place].AT, No sclera icterus Nose- [place] 3L/min Neck-Supple Neck,No JVD,.  Lungs-diminished breath sounds, bibasilar rales  CV- S1, S2 normal, regular , CABG scar Abd-  +ve B.Sounds, Abd Soft, right upper quadrant and epigastric tenderness, no rebound or guarding Extremity/Skin:- +ve  edema (left leg with scar from prior vein harvest for CABG, right leg is more swollen than left with pitting edema,, positive Homans on the right, pedal pulses present  Psych-affect is appropriate, oriented x3 Neuro-generalized weakness, no new  focal deficits, no tremors  Data Reviewed: I have personally reviewed following labs and imaging studies  CBC: Recent Labs  Lab 10/17/22 0035 10/17/22 0553 10/18/22 0444  WBC 8.2 7.8 6.7  NEUTROABS  --  6.2  --   HGB 11.8* 12.2* 12.2*  HCT 36.5* 38.0* 38.0*  MCV 97.6 97.4 98.2  PLT 111* 114* [id number]*   Basic Metabolic Panel: Recent Labs  Lab 10/17/22 0035 10/17/22 0553 10/18/22 0444  NA 138 137 139  K 5.0 5.0 4.2  CL 109 109 111  CO2 19* 20* 19*  GLUCOSE 289* 261* 89  BUN 64* 66* 58*  CREATININE 1.92* 1.95* 1.68*  CALCIUM 8.1* 8.6* 8.4*  MG  --  2.6*  --    GFR: Estimated Creatinine Clearance: 24.3 mL/min (A) (by C-G formula based on SCr of 1.68 mg/dL (H)). Liver Function Tests: Recent Labs  Lab 10/17/22 0035 10/17/22 0553 10/18/22 0444  AST 501* 366* 103*  ALT 483* 471* 279*  ALKPHOS 117 112 93  BILITOT 0.7 0.6 0.7  PROT 5.5* 5.5* 5.2*  ALBUMIN 3.2* 3.3* 3.0*   Radiology Studies: US Venous Img Lower Bilateral (DVT)  Result Date: 10/17/2022 CLINICAL DATA:  Bilateral lower extremity edema. EXAM: BILATERAL LOWER EXTREMITY VENOUS DOPPLER ULTRASOUND TECHNIQUE: Gray-scale sonography with graded compression, as well as color Doppler and duplex ultrasound  were performed to evaluate the lower extremity deep venous systems from the level of the common femoral vein and including the common femoral, femoral, profunda femoral, popliteal and calf veins including the posterior tibial, peroneal and gastrocnemius veins when visible. The superficial great saphenous vein was also interrogated. Spectral Doppler was utilized to evaluate flow at rest and with distal augmentation maneuvers in the common femoral, femoral and popliteal veins. COMPARISON:  Right lower extremity venous Doppler ultrasound 06/16/2022 FINDINGS: RIGHT LOWER EXTREMITY Common Femoral Vein: No evidence of thrombus. Normal compressibility, respiratory phasicity and response to augmentation. Saphenofemoral Junction: No evidence of thrombus. Normal compressibility and flow on color Doppler imaging. Profunda Femoral Vein: No evidence of thrombus. Normal compressibility and flow on color Doppler imaging. Femoral Vein: No evidence of thrombus. Normal compressibility, respiratory phasicity and response to augmentation. Popliteal Vein: Noncompressible with nonocclusive thrombus. Calf Veins: No evidence of thrombus. Normal compressibility and flow on color Doppler imaging. Superficial Great Saphenous Vein: No evidence of thrombus. Normal compressibility. Venous Reflux:  None. Other Findings:  None. LEFT LOWER EXTREMITY Common Femoral Vein: No evidence of thrombus. Normal compressibility, respiratory phasicity and response to augmentation. Saphenofemoral Junction: No evidence of thrombus. Normal compressibility and flow on color Doppler imaging. Profunda Femoral Vein: No evidence of thrombus. Normal compressibility and flow on color Doppler imaging. Femoral Vein: No evidence of thrombus. Normal compressibility, respiratory phasicity and response to augmentation. Popliteal Vein: No evidence of thrombus. Normal compressibility, respiratory phasicity and response to augmentation. Calf Veins: No evidence of thrombus.  Normal compressibility and flow on color Doppler imaging. Superficial Great Saphenous Vein: No evidence of thrombus. Normal compressibility. Venous Reflux:  None. Other Findings:  None. IMPRESSION: 1. Positive for nonocclusive DVT in the right popliteal vein. 2. No evidence of DVT in the left lower extremity. Electronically Signed   By: Logan Bores M.D.   On: 10/17/2022 11:35   US Abdomen Limited RUQ (LIVER/GB)  Result Date: 10/17/2022 CLINICAL DATA:  Nausea and vomiting EXAM: ULTRASOUND ABDOMEN LIMITED RIGHT UPPER QUADRANT COMPARISON:  CT scan of the abdomen and pelvis performed earlier today FINDINGS: Gallbladder: Echogenic material within the gallbladder lumen consistent with cholelithiasis.  There is evidence of posterior acoustic shadowing. The gallbladder wall is diffusely thickened measuring up to 7 mm. Per the sonographer, the sonographic Percell Miller sign was negative. Trace pericholecystic fluid is visualized. Common bile duct: Diameter: Normal at 6 mm. Liver: No focal lesion identified. Within normal limits in parenchymal echogenicity. Portal vein is patent on color Doppler imaging with normal direction of blood flow towards the liver. Other: None. IMPRESSION: Cholelithiasis with diffuse gallbladder wall thickening and pericholecystic fluid. In the appropriate clinical setting, findings are concerning for acute calculus cholecystitis. Electronically Signed   By: Jacqulynn Cadet M.D.   On: 10/17/2022 09:04   DG Chest Port 1 View  Result Date: 10/17/2022 CLINICAL DATA:  Congestive heart failure. EXAM: PORTABLE CHEST 1 VIEW COMPARISON:  06/16/2022 FINDINGS: 0645 hours. The cardio pericardial silhouette is enlarged. Interstitial markings are diffusely coarsened with chronic features. Bibasilar atelectasis with small bilateral pleural effusions evident. Airspace disease at the right base not excluded. Bones are diffusely demineralized. Telemetry leads overlie the chest. IMPRESSION: Bibasilar atelectasis  with small bilateral pleural effusions, right greater than left. Right basilar pneumonia cannot be excluded. Electronically Signed   By: Misty Stanley M.D.   On: 10/17/2022 06:55   CT ABDOMEN PELVIS W CONTRAST  Result Date: 10/17/2022 CLINICAL DATA:  Abdominal pain, acute, nonlocalized, vomiting EXAM: CT ABDOMEN AND PELVIS WITH CONTRAST TECHNIQUE: Multidetector CT imaging of the abdomen and pelvis was performed using the standard protocol following bolus administration of intravenous contrast. RADIATION DOSE REDUCTION: This exam was performed according to the departmental dose-optimization program which includes automated exposure control, adjustment of the mA and/or kV according to patient size and/or use of iterative reconstruction technique. CONTRAST:  47m OMNIPAQUE IOHEXOL 300 MG/ML  SOLN COMPARISON:  None Available. FINDINGS: Lower chest: Mild cardiomegaly. Extensive coronary artery calcification. Moderate right and trace left pleural effusions are present with associated bibasilar compressive atelectasis. Small hiatal hernia. Hepatobiliary: There is marked periportal edema which may relate to congestive hepatopathy, but is nonspecific. Scattered hypodensities within the liver are nonspecific, possibly representing small hepatic cysts or hemangioma in a patient without a history of malignancy. No enhancing intrahepatic mass. No intra or extrahepatic biliary ductal dilation. Cholelithiasis noted. Pancreas: Innumerable cystic lesions arise from the body and tail the pancreas likely representing multiple dilated pancreatic side branches or, less likely, multifocal intraductal pancreatic mucinous neoplasm. The largest cystic lesion measures 2.6 cm at axial image # 17/2. No peripancreatic inflammatory changes identified. Spleen: Unremarkable Adrenals/Urinary Tract: The adrenal glands are unremarkable. The kidneys are normal in size and position. There is a 17 mm hyperdense lesion arising from the lower pole of  the left kidney measuring 78 Hounsfield units on early arterial phase imaging and 97 Hounsfield units on delayed phase imaging in keeping with a primary renal neoplasm. No hydronephrosis. No intrarenal or ureteral calculi. The bladder is unremarkable. Stomach/Bowel: Mild ascites. The stomach, small bowel, and large bowel are otherwise unremarkable. The appendix is normal. No free intraperitoneal gas. Vascular/Lymphatic: No pathologic adenopathy within the abdomen and pelvis. Extensive aortoiliac atherosclerotic calcification. No aortic aneurysm. Reproductive: Marked prostatic enlargement. Other: Small bilateral inguinal hernias are present. There is mild diffuse subcutaneous body wall edema. Musculoskeletal: Osseous structures are diffusely osteopenic. No acute bone abnormality. Mild remote appearing compression deformity of L1. IMPRESSION: 1. No acute intra-abdominal pathology identified. No definite radiographic explanation for the patient's reported symptoms. 2. Mild cardiomegaly. Extensive coronary artery calcification. 3. Moderate right and trace left pleural effusions, mild ascites, and mild diffuse subcutaneous body  wall edema in keeping with mild anasarca. 4. Marked periportal edema, nonspecific, but possibly related to congestive hepatopathy given the above findings. Acute inflammatory conditions of the liver or biliary tree, however, could appear similarly and correlation with liver enzymes is recommended. 5. Cholelithiasis. 6. Innumerable cystic lesions arising from the body and tail the pancreas likely representing multiple dilated pancreatic side branches or, less likely, multifocal intraductal pancreatic mucinous neoplasm. The largest cystic lesion measures 2.6 cm. If clinically indicated, follow-up MRI examination in 6 months is recommended to document stability. 7. 17 mm hyperdense lesion arising from the lower pole of the left kidney in keeping with a primary renal neoplasm. Urologic consultation  may be helpful for further management. 8. Marked prostatic enlargement. Aortic Atherosclerosis (ICD10-I70.0). Electronically Signed   By: Fidela Salisbury M.D.   On: 10/17/2022 02:01    Scheduled Meds:  amiodarone  100 mg Oral Daily   aspirin EC  81 mg Oral Daily   insulin aspart  0-6 Units Subcutaneous TID WC   levothyroxine  88 mcg Oral QAC breakfast   Continuous Infusions:  sodium chloride 10 mL/hr at 10/17/22 2149   cefTRIAXone (ROCEPHIN)  IV 2 g (10/17/22 2117)   furosemide (LASIX) 200 mg in dextrose 5 % 100 mL (2 mg/mL) infusion 4 mg/hr (10/17/22 2255)   heparin 1,100 Units/hr (10/17/22 2300)   metronidazole 500 mg (10/18/22 0602)    LOS: 1 day   Roxan Hockey M.D on 10/18/2022 at 12:30 PM  Go to [email or web address] - for contact info  [facility] - Office  [phone]  If 7PM-7AM, please contact night-coverage [email or web address] 10/18/2022, 12:30 PM

## 2022-10-18 NOTE — Plan of Care (Signed)
Patient AOX4, VSS throughout shift.  HR 50s, pt remains asymptomatic.  All meds given on time as ordered.  Denied pain and SOB.  Male purewick remains in place.  Tele monitor on.  POC maintained, will continue to monitor.  Problem: Education: Goal: Knowledge of General Education information will improve Description: Including pain rating scale, medication(s)/side effects and non-pharmacologic comfort measures Outcome: Progressing   Problem: Health Behavior/Discharge Planning: Goal: Ability to manage health-related needs will improve Outcome: Progressing   Problem: Clinical Measurements: Goal: Ability to maintain clinical measurements within normal limits will improve Outcome: Progressing Goal: Will remain free from infection Outcome: Progressing Goal: Diagnostic test results will improve Outcome: Progressing Goal: Respiratory complications will improve Outcome: Progressing Goal: Cardiovascular complication will be avoided Outcome: Progressing   Problem: Activity: Goal: Risk for activity intolerance will decrease Outcome: Progressing   Problem: Nutrition: Goal: Adequate nutrition will be maintained Outcome: Progressing   Problem: Coping: Goal: Level of anxiety will decrease Outcome: Progressing   Problem: Elimination: Goal: Will not experience complications related to bowel motility Outcome: Progressing Goal: Will not experience complications related to urinary retention Outcome: Progressing   Problem: Pain Managment: Goal: General experience of comfort will improve Outcome: Progressing   Problem: Safety: Goal: Ability to remain free from injury will improve Outcome: Progressing   Problem: Skin Integrity: Goal: Risk for impaired skin integrity will decrease Outcome: Progressing   Problem: Education: Goal: Ability to describe self-care measures that may prevent or decrease complications (Diabetes Survival Skills Education) will improve Outcome: Progressing Goal:  Individualized Educational Video(s) Outcome: Progressing   Problem: Coping: Goal: Ability to adjust to condition or change in health will improve Outcome: Progressing   Problem: Fluid Volume: Goal: Ability to maintain a balanced intake and output will improve Outcome: Progressing   Problem: Health Behavior/Discharge Planning: Goal: Ability to identify and utilize available resources and services will improve Outcome: Progressing Goal: Ability to manage health-related needs will improve Outcome: Progressing   Problem: Metabolic: Goal: Ability to maintain appropriate glucose levels will improve Outcome: Progressing   Problem: Nutritional: Goal: Maintenance of adequate nutrition will improve Outcome: Progressing Goal: Progress toward achieving an optimal weight will improve Outcome: Progressing   Problem: Skin Integrity: Goal: Risk for impaired skin integrity will decrease Outcome: Progressing   Problem: Tissue Perfusion: Goal: Adequacy of tissue perfusion will improve Outcome: Progressing

## 2022-10-18 NOTE — Consult Note (Signed)
[facility] Consult  Reason for Consult: ? Cholecystis?  Referring Physician: Dr. Denton Brick  Chief Complaint   Emesis     HPI: Franklin Gonzalez is a 89 y.o. male with CHF EF 20-25%, CAD s/p CABG 1993, DM, HTN, CKD who was admitted with nausea and vomiting. CT in the ED showed periportal edema with congestive hepatopathy and left sided pleural effusion.  He had elevated transaminase but normal alk phos and bilirubin.    He had a [place] that demonstrated stones, some thickening and fluid, and has described some RUQ pain per report.  I asked for a HIDA given his CHF and risk of hepatic congestion causing capsular pain.   He also had a US showing DVT and now is on heparin gtt.  Discussed history with patient and his son. Son reported patient had an episode of bilious vomiting and this is what concerned them. He denies any abdominal pain with  food or now.   Past Medical History:  Diagnosis Date   Bifascicular block    Coronary artery disease    CABG [facility] at [facility]; NSTEMI 09/2012 s/p DES to SVG-OM1, DES to SVG-PDA    DM (diabetes mellitus) ([place])    HFrEF (heart failure with reduced ejection fraction) (HCC)    HTN (hypertension)    SVT (supraventricular tachycardia)     Past Surgical History:  Procedure Laterality Date   [place] CABG   LEFT HEART CATHETERIZATION WITH CORONARY/GRAFT ANGIOGRAM N/A 10/04/2012   Procedure: LEFT HEART CATHETERIZATION WITH Beatrix Fetters;  Surgeon: Sherren Mocha, MD;  Location: [facility] CATH LAB;  Service: Cardiovascular;  Laterality: N/A;    Family History  Problem Relation Age of Onset   CAD Other        multiple relatives including father and son   Colon cancer Neg Hx    Colon polyps Neg Hx    Gallbladder disease Neg Hx     Social History   Tobacco Use   Smoking status: Never   Smokeless tobacco: Never  Vaping Use   Vaping Use: Never used  Substance Use Topics   Alcohol use: No    Alcohol/week:  0.0 standard drinks of alcohol   Drug use: No    Medications: I have reviewed the patient's current medications. Prior to Admission:  Medications Prior to Admission  Medication Sig Dispense Refill Last Dose   amiodarone (PACERONE) 200 MG tablet Take 1/2 (one-half) tablet by mouth once daily 45 tablet 3 10/17/2022   aspirin EC 81 MG EC tablet Take 1 tablet (81 mg total) by mouth daily.   10/17/2022   atorvastatin (LIPITOR) 10 MG tablet Take 10 mg by mouth at bedtime.   10/16/2022   dorzolamide-timolol (COSOPT) 22.3-6.8 MG/ML ophthalmic solution Place 1 drop into both eyes 2 (two) times daily.   10/16/2022   doxazosin (CARDURA) 4 MG tablet Take 2 mg by mouth at bedtime.   10/16/2022   glipiZIDE (GLUCOTROL XL) 2.5 MG 24 hr tablet Take 2.5 mg by mouth every morning.   10/16/2022   latanoprost (XALATAN) 0.005 % ophthalmic solution Place 1 drop into both eyes every evening.   10/16/2022   levothyroxine (SYNTHROID) 88 MCG tablet Take 88 mcg by mouth daily.   10/16/2022   levothyroxine (SYNTHROID) 88 MCG tablet Take 88 mcg by mouth daily before breakfast.   10/17/2022   losartan (COZAAR) 50 MG tablet Take 1 tablet by mouth once daily 90 tablet 3 10/16/2022   nitroGLYCERIN (NITROSTAT)  0.4 MG SL tablet Place 1 tablet (0.4 mg total) under the tongue every 5 (five) minutes x 3 doses as needed for chest pain. 60 tablet 3 unknown   Polyethyl Glycol-Propyl Glycol (SYSTANE OP) Apply 1 drop to eye as needed (dry eye).   unknown   potassium chloride (KLOR-CON M) 10 MEQ tablet Take 0.5 tablets (5 mEq total) by mouth daily. 90 tablet 1 10/16/2022   spironolactone (ALDACTONE) 25 MG tablet Take 0.5 tablets (12.5 mg total) by mouth daily. 30 tablet 1 10/16/2022   torsemide (DEMADEX) 20 MG tablet Take 1 tablet (20 mg total) by mouth daily. Take one tablet every other day 30 tablet 2 10/16/2022   ammonium lactate (AMLACTIN) 12 % cream Apply topically 2 (two) times daily.      glipiZIDE (GLUCOTROL) 5 MG tablet Take 2.5 mg by mouth  daily. (Patient not taking: Reported on 10/17/2022)   Not Taking   Scheduled:  amiodarone  100 mg Oral Daily   aspirin EC  81 mg Oral Daily   insulin aspart  0-6 Units Subcutaneous TID WC   levothyroxine  88 mcg Oral QAC breakfast   Continuous:  sodium chloride 10 mL/hr at 10/17/22 2149   cefTRIAXone (ROCEPHIN)  IV 2 g (10/17/22 2117)   furosemide (LASIX) 200 mg in dextrose 5 % 100 mL (2 mg/mL) infusion 4 mg/hr (10/17/22 2255)   heparin 1,100 Units/hr (10/17/22 2300)   metronidazole 500 mg (10/18/22 0602)   [id number] chloride, acetaminophen **OR** acetaminophen, fentaNYL (SUBLIMAZE) injection, melatonin, naLOXone (NARCAN)  injection, ondansetron (ZOFRAN) IV  No Known Allergies   ROS:  A comprehensive review of systems was negative except for: Cardiovascular: positive for CHF Gastrointestinal: positive for abdominal pain, nausea, and vomiting  Blood pressure 104/67, pulse 87, temperature 97.9 F (36.6 C), temperature source Oral, resp. rate 18, height 5' 11"$  (1.803 m), weight 70 kg, SpO2 100 %. Physical Exam Vitals reviewed.  HENT:     Head: Atraumatic.     Nose: Nose normal.  Eyes:     Extraocular Movements: Extraocular movements intact.  Cardiovascular:     Rate and Rhythm: Normal rate.  Pulmonary:     Effort: Pulmonary effort is normal.  Abdominal:     Comments: Deferred this exam due to patient getting ECHO  Musculoskeletal:        General: No swelling.  Skin:    General: Skin is warm.  Neurological:     General: No focal deficit present.     Mental Status: He is alert.  Psychiatric:        Mood and Affect: Mood normal.     Results: Results for orders placed or performed during the hospital encounter of 10/17/22 (from the past 48 hour(s))  Lipase, blood     Status: None   Collection Time: 10/17/22 12:35 AM  Result Value Ref Range   Lipase 37 11 - 51 U/L    Comment: Performed at [facility], [address], [place], [place] [address]  Comprehensive  metabolic panel     Status: Abnormal   Collection Time: 10/17/22 12:35 AM  Result Value Ref Range   Sodium 138 135 - 145 mmol/L   Potassium 5.0 3.5 - 5.1 mmol/L   Chloride 109 98 - 111 mmol/L   CO2 19 (L) 22 - 32 mmol/L   Glucose, Bld 289 (H) 70 - 99 mg/dL    Comment: Glucose reference range applies only to samples taken after fasting for at least 8 hours.   BUN  64 (H) 8 - 23 mg/dL   Creatinine, Ser 1.92 (H) 0.61 - 1.24 mg/dL   Calcium 8.1 (L) 8.9 - 10.3 mg/dL   Total Protein 5.5 (L) 6.5 - 8.1 g/dL   Albumin 3.2 (L) 3.5 - 5.0 g/dL   AST 501 (H) 15 - 41 U/L   ALT 483 (H) 0 - 44 U/L   Alkaline Phosphatase 117 38 - 126 U/L   Total Bilirubin 0.7 0.3 - 1.2 mg/dL   GFR, Estimated 31 (L) >60 mL/min    Comment: (NOTE) Calculated using the CKD-EPI Creatinine Equation (2021)    Anion gap 10 5 - 15    Comment: Performed at [facility], [address] [place], [place] [address]  CBC     Status: Abnormal   Collection Time: 10/17/22 12:35 AM  Result Value Ref Range   WBC 8.2 4.0 - 10.5 K/uL   RBC 3.74 (L) 4.22 - 5.81 MIL/uL   Hemoglobin 11.8 (L) 13.0 - 17.0 g/dL   HCT 36.5 (L) 39.0 - 52.0 %   MCV 97.6 80.0 - 100.0 fL   MCH 31.6 26.0 - 34.0 pg   MCHC 32.3 30.0 - 36.0 g/dL   RDW 14.8 11.5 - 15.5 %   Platelets 111 (L) 150 - 400 K/uL   nRBC 0.0 0.0 - 0.2 %    Comment: Performed at [facility], [address], [place], [place] [address]  CBC with Differential/Platelet     Status: Abnormal   Collection Time: 10/17/22  5:53 AM  Result Value Ref Range   WBC 7.8 4.0 - 10.5 K/uL   RBC 3.90 (L) 4.22 - 5.81 MIL/uL   Hemoglobin 12.2 (L) 13.0 - 17.0 g/dL   HCT 38.0 (L) 39.0 - 52.0 %   MCV 97.4 80.0 - 100.0 fL   MCH 31.3 26.0 - 34.0 pg   MCHC 32.1 30.0 - 36.0 g/dL   RDW 14.9 11.5 - 15.5 %   Platelets 114 (L) 150 - 400 K/uL   nRBC 0.0 0.0 - 0.2 %   Neutrophils Relative % 80 %   Neutro Abs 6.2 1.7 - 7.7 K/uL   Lymphocytes Relative 12 %   Lymphs Abs 0.9 0.7 - 4.0 K/uL   Monocytes Relative 7 %    Monocytes Absolute 0.5 0.1 - 1.0 K/uL   Eosinophils Relative 0 %   Eosinophils Absolute 0.0 0.0 - 0.5 K/uL   Basophils Relative 0 %   Basophils Absolute 0.0 0.0 - 0.1 K/uL   Immature Granulocytes 1 %   Abs Immature Granulocytes 0.06 0.00 - 0.07 K/uL    Comment: Performed at [facility], [address] [place], [place] [address]  Comprehensive metabolic panel     Status: Abnormal   Collection Time: 10/17/22  5:53 AM  Result Value Ref Range   Sodium 137 135 - 145 mmol/L   Potassium 5.0 3.5 - 5.1 mmol/L   Chloride 109 98 - 111 mmol/L   CO2 20 (L) 22 - 32 mmol/L   Glucose, Bld 261 (H) 70 - 99 mg/dL    Comment: Glucose reference range applies only to samples taken after fasting for at least 8 hours.   BUN 66 (H) 8 - 23 mg/dL   Creatinine, Ser 1.95 (H) 0.61 - 1.24 mg/dL   Calcium 8.6 (L) 8.9 - 10.3 mg/dL   Total Protein 5.5 (L) 6.5 - 8.1 g/dL   Albumin 3.3 (L) 3.5 - 5.0 g/dL   AST 366 (H) 15 - 41 U/L  ALT 471 (H) 0 - 44 U/L   Alkaline Phosphatase 112 38 - 126 U/L   Total Bilirubin 0.6 0.3 - 1.2 mg/dL   GFR, Estimated 31 (L) >60 mL/min    Comment: (NOTE) Calculated using the CKD-EPI Creatinine Equation (2021)    Anion gap 8 5 - 15    Comment: Performed at [facility], [address], [place], [place] [address]  Magnesium     Status: Abnormal   Collection Time: 10/17/22  5:53 AM  Result Value Ref Range   Magnesium 2.6 (H) 1.7 - 2.4 mg/dL    Comment: Performed at [facility], [address] [place], [place] [address]  Brain natriuretic peptide     Status: Abnormal   Collection Time: 10/17/22  7:38 AM  Result Value Ref Range   B Natriuretic Peptide 3,338.0 (H) 0.0 - 100.0 pg/mL    Comment: Performed at [facility], [address] [place], [place] [address]  Acetaminophen level     Status: Abnormal   Collection Time: 10/17/22  7:38 AM  Result Value Ref Range   Acetaminophen (Tylenol), Serum <10 (L) 10 - 30 ug/mL    Comment: (NOTE) Therapeutic concentrations vary  significantly. A range of 10-30 ug/mL  may be an effective concentration for many patients. However, some  are best treated at concentrations outside of this range. Acetaminophen concentrations >150 ug/mL at 4 hours after ingestion  and >50 ug/mL at 12 hours after ingestion are often associated with  toxic reactions.  Performed at [facility], [address], [place], [place] [address]   Hepatitis panel, acute     Status: None   Collection Time: 10/17/22  7:38 AM  Result Value Ref Range   Hepatitis B Surface Ag NON REACTIVE NON REACTIVE   HCV Ab NON REACTIVE NON REACTIVE    Comment: (NOTE) Nonreactive HCV antibody screen is consistent with no HCV infections,  unless recent infection is suspected or other evidence exists to indicate HCV infection.     Hep A IgM NON REACTIVE NON REACTIVE   Hep B C IgM NON REACTIVE NON REACTIVE    Comment: Performed at [facility], [place] [address] [place], [place] [address]  Protime-INR     Status: Abnormal   Collection Time: 10/17/22  7:38 AM  Result Value Ref Range   Prothrombin Time 15.9 (H) 11.4 - 15.2 seconds   INR 1.3 (H) 0.8 - 1.2    Comment: (NOTE) INR goal varies based on device and disease states. Performed at [facility], [address], [place], [place] [address]   TSH     Status: None   Collection Time: 10/17/22  7:38 AM  Result Value Ref Range   TSH 0.676 0.350 - 4.500 uIU/mL    Comment: Performed by a 3rd Generation assay with a functional sensitivity of <=0.01 uIU/mL. Performed at [facility], [address], [place], [place] [address]   CBG monitoring, ED     Status: Abnormal   Collection Time: 10/17/22  7:38 AM  Result Value Ref Range   Glucose-Capillary 245 (H) 70 - 99 mg/dL    Comment: Glucose reference range applies only to samples taken after fasting for at least 8 hours.  Urinalysis, Complete w Microscopic -Urine, Clean Catch     Status: Abnormal   Collection Time: 10/17/22  9:34 AM  Result Value Ref Range    Color, Urine YELLOW YELLOW   APPearance CLEAR CLEAR   Specific Gravity, Urine 1.016 1.005 - 1.030  pH 5.0 5.0 - 8.0   Glucose, UA NEGATIVE NEGATIVE mg/dL   Hgb urine dipstick SMALL (A) NEGATIVE   Bilirubin Urine NEGATIVE NEGATIVE   Ketones, ur NEGATIVE NEGATIVE mg/dL   Protein, ur 30 (A) NEGATIVE mg/dL   Nitrite NEGATIVE NEGATIVE   Leukocytes,Ua TRACE (A) NEGATIVE   RBC / HPF 0-5 0 - 5 RBC/hpf   WBC, UA 11-20 0 - 5 WBC/hpf   Bacteria, UA RARE (A) NONE SEEN   Squamous Epithelial / HPF 0-5 0 - 5 /HPF    Comment: Performed at [facility], [address], [place], [place] [address]  Sodium, urine, random     Status: None   Collection Time: 10/17/22  9:40 AM  Result Value Ref Range   Sodium, Ur 82 mmol/L    Comment: Performed at [facility], [address] [place], [place] [address]  Creatinine, urine, random     Status: None   Collection Time: 10/17/22  9:40 AM  Result Value Ref Range   Creatinine, Urine 38.99 mg/dL    Comment: Performed at [facility], [address], [place], [place] [address]  Glucose, capillary     Status: Abnormal   Collection Time: 10/17/22 12:12 PM  Result Value Ref Range   Glucose-Capillary 257 (H) 70 - 99 mg/dL    Comment: Glucose reference range applies only to samples taken after fasting for at least 8 hours.  Glucose, capillary     Status: Abnormal   Collection Time: 10/17/22  3:39 PM  Result Value Ref Range   Glucose-Capillary 210 (H) 70 - 99 mg/dL    Comment: Glucose reference range applies only to samples taken after fasting for at least 8 hours.  Glucose, capillary     Status: Abnormal   Collection Time: 10/17/22  5:25 PM  Result Value Ref Range   Glucose-Capillary 182 (H) 70 - 99 mg/dL    Comment: Glucose reference range applies only to samples taken after fasting for at least 8 hours.  Glucose, capillary     Status: Abnormal   Collection Time: 10/17/22  8:13 PM  Result Value Ref Range   Glucose-Capillary 143 (H) 70 - 99 mg/dL    Comment:  Glucose reference range applies only to samples taken after fasting for at least 8 hours.  Heparin level (unfractionated)     Status: None   Collection Time: 10/17/22 11:13 PM  Result Value Ref Range   Heparin Unfractionated 0.54 0.30 - 0.70 IU/mL    Comment: (NOTE) The clinical reportable range upper limit is being lowered to >1.10 to align with the FDA approved guidance for the current laboratory assay.  If heparin results are below expected values, and patient dosage has  been confirmed, suggest follow up testing of antithrombin III levels. Performed at [facility], [address], [place], [place] [address]   Heparin level (unfractionated)     Status: None   Collection Time: 10/18/22  4:44 AM  Result Value Ref Range   Heparin Unfractionated 0.63 0.30 - 0.70 IU/mL    Comment: (NOTE) The clinical reportable range upper limit is being lowered to >1.10 to align with the FDA approved guidance for the current laboratory assay.  If heparin results are below expected values, and patient dosage has  been confirmed, suggest follow up testing of antithrombin III levels. Performed at [facility], [address] [place],  [address]   CBC     Status: Abnormal   Collection Time: 10/18/22  4:44 AM  Result  Value Ref Range   WBC 6.7 4.0 - 10.5 K/uL   RBC 3.87 (L) 4.22 - 5.81 MIL/uL   Hemoglobin 12.2 (L) 13.0 - 17.0 g/dL   HCT 38.0 (L) 39.0 - 52.0 %   MCV 98.2 80.0 - 100.0 fL   MCH 31.5 26.0 - 34.0 pg   MCHC 32.1 30.0 - 36.0 g/dL   RDW 15.0 11.5 - 15.5 %   Platelets 114 (L) 150 - 400 K/uL   nRBC 0.0 0.0 - 0.2 %    Comment: Performed at [facility], [address] [place], [place] [address]  Comprehensive metabolic panel     Status: Abnormal   Collection Time: 10/18/22  4:44 AM  Result Value Ref Range   Sodium 139 135 - 145 mmol/L   Potassium 4.2 3.5 - 5.1 mmol/L   Chloride 111 98 - 111 mmol/L   CO2 19 (L) 22 - 32 mmol/L   Glucose, Bld 89 70 - 99 mg/dL    Comment: Glucose  reference range applies only to samples taken after fasting for at least 8 hours.   BUN 58 (H) 8 - 23 mg/dL   Creatinine, Ser 1.68 (H) 0.61 - 1.24 mg/dL   Calcium 8.4 (L) 8.9 - 10.3 mg/dL   Total Protein 5.2 (L) 6.5 - 8.1 g/dL   Albumin 3.0 (L) 3.5 - 5.0 g/dL   AST 103 (H) 15 - 41 U/L   ALT 279 (H) 0 - 44 U/L   Alkaline Phosphatase 93 38 - 126 U/L   Total Bilirubin 0.7 0.3 - 1.2 mg/dL   GFR, Estimated 37 (L) >60 mL/min    Comment: (NOTE) Calculated using the CKD-EPI Creatinine Equation (2021)    Anion gap 9 5 - 15    Comment: Performed at [facility], [address] [place], [place] [address]  Glucose, capillary     Status: Abnormal   Collection Time: 10/18/22  7:40 AM  Result Value Ref Range   Glucose-Capillary 58 (L) 70 - 99 mg/dL    Comment: Glucose reference range applies only to samples taken after fasting for at least 8 hours.   Comment 1 Notify RN   Glucose, capillary     Status: None   Collection Time: 10/18/22  8:09 AM  Result Value Ref Range   Glucose-Capillary 96 70 - 99 mg/dL    Comment: Glucose reference range applies only to samples taken after fasting for at least 8 hours.  Glucose, capillary     Status: Abnormal   Collection Time: 10/18/22 11:08 AM  Result Value Ref Range   Glucose-Capillary 133 (H) 70 - 99 mg/dL    Comment: Glucose reference range applies only to samples taken after fasting for at least 8 hours.   Personally reviewed CT and [place]- some stones in gallbladder, some possibly thickening/ edema  US Venous Img Lower Bilateral (DVT)  Result Date: 10/17/2022 CLINICAL DATA:  Bilateral lower extremity edema. EXAM: BILATERAL LOWER EXTREMITY VENOUS DOPPLER ULTRASOUND TECHNIQUE: Gray-scale sonography with graded compression, as well as color Doppler and duplex ultrasound were performed to evaluate the lower extremity deep venous systems from the level of the common femoral vein and including the common femoral, femoral, profunda femoral, popliteal and calf veins  including the posterior tibial, peroneal and gastrocnemius veins when visible. The superficial great saphenous vein was also interrogated. Spectral Doppler was utilized to evaluate flow at rest and with distal augmentation maneuvers in the common femoral, femoral and popliteal veins. COMPARISON:  Right lower extremity venous  Doppler ultrasound 06/16/2022 FINDINGS: RIGHT LOWER EXTREMITY Common Femoral Vein: No evidence of thrombus. Normal compressibility, respiratory phasicity and response to augmentation. Saphenofemoral Junction: No evidence of thrombus. Normal compressibility and flow on color Doppler imaging. Profunda Femoral Vein: No evidence of thrombus. Normal compressibility and flow on color Doppler imaging. Femoral Vein: No evidence of thrombus. Normal compressibility, respiratory phasicity and response to augmentation. Popliteal Vein: Noncompressible with nonocclusive thrombus. Calf Veins: No evidence of thrombus. Normal compressibility and flow on color Doppler imaging. Superficial Great Saphenous Vein: No evidence of thrombus. Normal compressibility. Venous Reflux:  None. Other Findings:  None. LEFT LOWER EXTREMITY Common Femoral Vein: No evidence of thrombus. Normal compressibility, respiratory phasicity and response to augmentation. Saphenofemoral Junction: No evidence of thrombus. Normal compressibility and flow on color Doppler imaging. Profunda Femoral Vein: No evidence of thrombus. Normal compressibility and flow on color Doppler imaging. Femoral Vein: No evidence of thrombus. Normal compressibility, respiratory phasicity and response to augmentation. Popliteal Vein: No evidence of thrombus. Normal compressibility, respiratory phasicity and response to augmentation. Calf Veins: No evidence of thrombus. Normal compressibility and flow on color Doppler imaging. Superficial Great Saphenous Vein: No evidence of thrombus. Normal compressibility. Venous Reflux:  None. Other Findings:  None. IMPRESSION: 1.  Positive for nonocclusive DVT in the right popliteal vein. 2. No evidence of DVT in the left lower extremity. Electronically Signed   By: Logan Bores M.D.   On: 10/17/2022 11:35   US Abdomen Limited RUQ (LIVER/GB)  Result Date: 10/17/2022 CLINICAL DATA:  Nausea and vomiting EXAM: ULTRASOUND ABDOMEN LIMITED RIGHT UPPER QUADRANT COMPARISON:  CT scan of the abdomen and pelvis performed earlier today FINDINGS: Gallbladder: Echogenic material within the gallbladder lumen consistent with cholelithiasis. There is evidence of posterior acoustic shadowing. The gallbladder wall is diffusely thickened measuring up to 7 mm. Per the sonographer, the sonographic Percell Miller sign was negative. Trace pericholecystic fluid is visualized. Common bile duct: Diameter: Normal at 6 mm. Liver: No focal lesion identified. Within normal limits in parenchymal echogenicity. Portal vein is patent on color Doppler imaging with normal direction of blood flow towards the liver. Other: None. IMPRESSION: Cholelithiasis with diffuse gallbladder wall thickening and pericholecystic fluid. In the appropriate clinical setting, findings are concerning for acute calculus cholecystitis. Electronically Signed   By: Jacqulynn Cadet M.D.   On: 10/17/2022 09:04   DG Chest Port 1 View  Result Date: 10/17/2022 CLINICAL DATA:  Congestive heart failure. EXAM: PORTABLE CHEST 1 VIEW COMPARISON:  06/16/2022 FINDINGS: 0645 hours. The cardio pericardial silhouette is enlarged. Interstitial markings are diffusely coarsened with chronic features. Bibasilar atelectasis with small bilateral pleural effusions evident. Airspace disease at the right base not excluded. Bones are diffusely demineralized. Telemetry leads overlie the chest. IMPRESSION: Bibasilar atelectasis with small bilateral pleural effusions, right greater than left. Right basilar pneumonia cannot be excluded. Electronically Signed   By: Misty Stanley M.D.   On: 10/17/2022 06:55   CT ABDOMEN PELVIS W  CONTRAST  Result Date: 10/17/2022 CLINICAL DATA:  Abdominal pain, acute, nonlocalized, vomiting EXAM: CT ABDOMEN AND PELVIS WITH CONTRAST TECHNIQUE: Multidetector CT imaging of the abdomen and pelvis was performed using the standard protocol following bolus administration of intravenous contrast. RADIATION DOSE REDUCTION: This exam was performed according to the departmental dose-optimization program which includes automated exposure control, adjustment of the mA and/or kV according to patient size and/or use of iterative reconstruction technique. CONTRAST:  27m OMNIPAQUE IOHEXOL 300 MG/ML  SOLN COMPARISON:  None Available. FINDINGS: Lower chest: Mild cardiomegaly. Extensive coronary  artery calcification. Moderate right and trace left pleural effusions are present with associated bibasilar compressive atelectasis. Small hiatal hernia. Hepatobiliary: There is marked periportal edema which may relate to congestive hepatopathy, but is nonspecific. Scattered hypodensities within the liver are nonspecific, possibly representing small hepatic cysts or hemangioma in a patient without a history of malignancy. No enhancing intrahepatic mass. No intra or extrahepatic biliary ductal dilation. Cholelithiasis noted. Pancreas: Innumerable cystic lesions arise from the body and tail the pancreas likely representing multiple dilated pancreatic side branches or, less likely, multifocal intraductal pancreatic mucinous neoplasm. The largest cystic lesion measures 2.6 cm at axial image # 17/2. No peripancreatic inflammatory changes identified. Spleen: Unremarkable Adrenals/Urinary Tract: The adrenal glands are unremarkable. The kidneys are normal in size and position. There is a 17 mm hyperdense lesion arising from the lower pole of the left kidney measuring 78 Hounsfield units on early arterial phase imaging and 97 Hounsfield units on delayed phase imaging in keeping with a primary renal neoplasm. No hydronephrosis. No intrarenal  or ureteral calculi. The bladder is unremarkable. Stomach/Bowel: Mild ascites. The stomach, small bowel, and large bowel are otherwise unremarkable. The appendix is normal. No free intraperitoneal gas. Vascular/Lymphatic: No pathologic adenopathy within the abdomen and pelvis. Extensive aortoiliac atherosclerotic calcification. No aortic aneurysm. Reproductive: Marked prostatic enlargement. Other: Small bilateral inguinal hernias are present. There is mild diffuse subcutaneous body wall edema. Musculoskeletal: Osseous structures are diffusely osteopenic. No acute bone abnormality. Mild remote appearing compression deformity of L1. IMPRESSION: 1. No acute intra-abdominal pathology identified. No definite radiographic explanation for the patient's reported symptoms. 2. Mild cardiomegaly. Extensive coronary artery calcification. 3. Moderate right and trace left pleural effusions, mild ascites, and mild diffuse subcutaneous body wall edema in keeping with mild anasarca. 4. Marked periportal edema, nonspecific, but possibly related to congestive hepatopathy given the above findings. Acute inflammatory conditions of the liver or biliary tree, however, could appear similarly and correlation with liver enzymes is recommended. 5. Cholelithiasis. 6. Innumerable cystic lesions arising from the body and tail the pancreas likely representing multiple dilated pancreatic side branches or, less likely, multifocal intraductal pancreatic mucinous neoplasm. The largest cystic lesion measures 2.6 cm. If clinically indicated, follow-up MRI examination in 6 months is recommended to document stability. 7. 17 mm hyperdense lesion arising from the lower pole of the left kidney in keeping with a primary renal neoplasm. Urologic consultation may be helpful for further management. 8. Marked prostatic enlargement. Aortic Atherosclerosis (ICD10-I70.0). Electronically Signed   By: Fidela Salisbury M.D.   On: 10/17/2022 02:01     Assessment &  Plan:  Franklin Gonzalez is a 89 y.o. male with gallstones and some gallbladder distention. He had some nausea/vomiting yesterday but no real complaints of abdominal pain to me and nothing with food. I did not get to push on his abdomen today due to his ECHO being done at the same time. Discussed with him and his son I am worried that his CHF is adding to  hepatic congestion and causing LFT elevation with normal bilirubin and alk phos. Discussed plan for HIDA. Discussed that if he did have gallbladder issues his cardiac problems would prevent him from being an operative candidate here and likely even at [facility]/ [facility] etc. Discussed that he has stones but I do not think he has cholecystitis. The hida will confirm this for sure.   Follow up HIDA Diet after HIDA scan   All questions were answered to the satisfaction of the patient and family.  Virl Cagey 10/18/2022, 2:07 PM

## 2022-10-18 NOTE — Progress Notes (Signed)
*  PRELIMINARY RESULTS* Echocardiogram 2D Echocardiogram has been performed.  Franklin Gonzalez 10/18/2022, 3:13 PM

## 2022-10-18 NOTE — Progress Notes (Signed)
ANTICOAGULATION CONSULT NOTE -   Pharmacy Consult for heparin Indication: DVT  No Known Allergies  Patient Measurements: Height: 5' 11"$  (180.3 cm) Weight: 70 kg (154 lb 5.2 oz) IBW/kg (Calculated) : 75.3 Heparin Dosing Weight: 65  Vital Signs: Temp: 97.7 F (36.5 C) (02/12 0438) Temp Source: Oral (02/12 0438) BP: 105/68 (02/12 0438) Pulse Rate: 57 (02/12 0500)  Labs: Recent Labs    10/17/22 0035 10/17/22 0553 10/17/22 0738 10/17/22 2313 10/18/22 0444  HGB 11.8* 12.2*  --   --  12.2*  HCT 36.5* 38.0*  --   --  38.0*  PLT 111* 114*  --   --  114*  LABPROT  --   --  15.9*  --   --   INR  --   --  1.3*  --   --   HEPARINUNFRC  --   --   --  0.54 0.63  CREATININE 1.92* 1.95*  --   --  1.68*     Estimated Creatinine Clearance: 24.3 mL/min (A) (by C-G formula based on SCr of 1.68 mg/dL (H)).   Medical History: Past Medical History:  Diagnosis Date   Bifascicular block    Coronary artery disease    CABG 1993 at [facility]; NSTEMI 09/2012 s/p DES to SVG-OM1, DES to SVG-PDA    DM (diabetes mellitus) (HCC)    HTN (hypertension)    SVT (supraventricular tachycardia)     Medications:  Medications Prior to Admission  Medication Sig Dispense Refill Last Dose   amiodarone (PACERONE) 200 MG tablet Take 1/2 (one-half) tablet by mouth once daily 45 tablet 3 10/17/2022   aspirin EC 81 MG EC tablet Take 1 tablet (81 mg total) by mouth daily.   10/17/2022   atorvastatin (LIPITOR) 10 MG tablet Take 10 mg by mouth at bedtime.   10/16/2022   dorzolamide-timolol (COSOPT) 22.3-6.8 MG/ML ophthalmic solution Place 1 drop into both eyes 2 (two) times daily.   10/16/2022   doxazosin (CARDURA) 4 MG tablet Take 2 mg by mouth at bedtime.   10/16/2022   glipiZIDE (GLUCOTROL XL) 2.5 MG 24 hr tablet Take 2.5 mg by mouth every morning.   10/16/2022   latanoprost (XALATAN) 0.005 % ophthalmic solution Place 1 drop into both eyes every evening.   10/16/2022   levothyroxine (SYNTHROID) 88 MCG  tablet Take 88 mcg by mouth daily.   10/16/2022   levothyroxine (SYNTHROID) 88 MCG tablet Take 88 mcg by mouth daily before breakfast.   10/17/2022   losartan (COZAAR) 50 MG tablet Take 1 tablet by mouth once daily 90 tablet 3 10/16/2022   nitroGLYCERIN (NITROSTAT) 0.4 MG SL tablet Place 1 tablet (0.4 mg total) under the tongue every 5 (five) minutes x 3 doses as needed for chest pain. 60 tablet 3 unknown   Polyethyl Glycol-Propyl Glycol (SYSTANE OP) Apply 1 drop to eye as needed (dry eye).   unknown   potassium chloride (KLOR-CON M) 10 MEQ tablet Take 0.5 tablets (5 mEq total) by mouth daily. 90 tablet 1 10/16/2022   spironolactone (ALDACTONE) 25 MG tablet Take 0.5 tablets (12.5 mg total) by mouth daily. 30 tablet 1 10/16/2022   torsemide (DEMADEX) 20 MG tablet Take 1 tablet (20 mg total) by mouth daily. Take one tablet every other day 30 tablet 2 10/16/2022   ammonium lactate (AMLACTIN) 12 % cream Apply topically 2 (two) times daily.      glipiZIDE (GLUCOTROL) 5 MG tablet Take 2.5 mg by mouth daily. (Patient not taking: Reported on  10/17/2022)   Not Taking    Assessment: Pharmacy consulted to dose heparin in patient with right leg DVT.  Patient is not on anticoagulation prior to admission.  HL 0.63- therapeutic Hgb 12.2 Platelets 114  Goal of Therapy:  Heparin level 0.3-0.7 units/ml Monitor platelets by anticoagulation protocol: Yes   Plan:  Continue heparin infusion at 1100 units/hr. Check anti-Xa level daily Continue to monitor H&H and platelets.  Margot Ables, PharmD Clinical Pharmacist 10/18/2022 7:34 AM

## 2022-10-18 NOTE — Consult Note (Addendum)
Cardiology Consultation   Patient ID: Nima Sill MRN: [id number]; DOB: 21-Jul-1922  Admit date: 10/17/2022 Date of Consult: 10/18/2022  PCP:  West Line Nation, MD   [place] Providers Cardiologist:  Kirk Ruths, MD  Cardiology APP:  Ledora Bottcher, [place]       Patient Profile:   Franklin Gonzalez is a 89 y.o. male with a hx of chronic biventricular CHF who is being seen 10/18/2022 for the evaluation of acute on chronic CHF and right LE DVT at the request of Dr. Denton Brick.  History of Present Illness:   Franklin Gonzalez has a hx of CAD s/p CABG 1993 at [facility], NSTEMI 09/2012 treated with PCI/DES to SVG-RCA and DES-SVG G-OM, mild cardiomyopathy with an LVEF 45-50%, SVT in the setting of NSTEMI, carotid artery stenosis, hypertension, hyperlipidemia, DM 2.  He is maintained on amiodarone 100 mg daily for SVT.  LVEF was noted at 25% in 06/2020. Cardiomyopathy has been treated conservatively with losartan and torsemide, no additional ischemic evaluation given his age.  He did not tolerate Entresto and has a history of bradycardia.     He was hospitalized at [facility] 06/2022 for acute on chronic combined systolic and diastolic heart failure. Echocardiogram that admission confirmed an LVEF 20-25%, severely reduced RV function with PASP 47 mmHg, severe biatrial enlargement, trivial AI.  Patient was admitted yesterday presenting with back pain and sudden onset emesis.  Reportedly, heart failure symptoms have been relatively stable at home until recently, son manages his torsemide and said his weight was up 4 lbs on Sat so planned on giving an extra torsemide for leg swelling. Denies chest pain, dyspnea, leg pain.  BNP 3338, CXR bilateral pleural effusions R>L. Started on IV lasix drip b/c of soft BP's.  Concurrently diagnosed with right leg DVT and suspected cholecystitis.   Past Medical History:  Diagnosis Date   Bifascicular block    Coronary artery disease    CABG  1993 at [facility]; NSTEMI 09/2012 s/p DES to SVG-OM1, DES to SVG-PDA    DM (diabetes mellitus) ([place])    HFrEF (heart failure with reduced ejection fraction) (HCC)    HTN (hypertension)    SVT (supraventricular tachycardia)     Past Surgical History:  Procedure Laterality Date   [place] CABG   LEFT HEART CATHETERIZATION WITH CORONARY/GRAFT ANGIOGRAM N/A 10/04/2012   Procedure: LEFT HEART CATHETERIZATION WITH Beatrix Fetters;  Surgeon: Sherren Mocha, MD;  Location: [facility] CATH LAB;  Service: Cardiovascular;  Laterality: N/A;     Home Medications:  Prior to Admission medications   Medication Sig Start Date End Date Taking? Authorizing Provider  amiodarone (PACERONE) 200 MG tablet Take 1/2 (one-half) tablet by mouth once daily 12/22/21  Yes Crenshaw, Denice Bors, MD  aspirin EC 81 MG EC tablet Take 1 tablet (81 mg total) by mouth daily. 10/07/12  Yes Edmisten, Brooke O, PA-C  atorvastatin (LIPITOR) 10 MG tablet Take 10 mg by mouth at bedtime.   Yes [provider]  dorzolamide-timolol (COSOPT) 22.3-6.8 MG/ML ophthalmic solution Place 1 drop into both eyes 2 (two) times daily. 09/21/21  Yes [provider]  doxazosin (CARDURA) 4 MG tablet Take 2 mg by mouth at bedtime.   Yes [provider]  glipiZIDE (GLUCOTROL XL) 2.5 MG 24 hr tablet Take 2.5 mg by mouth every morning. 09/22/22  Yes [provider]  latanoprost (XALATAN) 0.005 % ophthalmic solution Place 1 drop into both eyes every evening. 10/17/21  Yes [provider]  levothyroxine (SYNTHROID) 88 MCG tablet Take 88 mcg by mouth daily. 11/15/21  Yes [provider]  levothyroxine (SYNTHROID) 88 MCG tablet Take 88 mcg by mouth daily before breakfast.   Yes [provider]  losartan (COZAAR) 50 MG tablet Take 1 tablet by mouth once daily 07/12/22  Yes Crenshaw, Denice Bors, MD  nitroGLYCERIN (NITROSTAT) 0.4 MG SL tablet Place 1 tablet (0.4 mg total) under the tongue  every 5 (five) minutes x 3 doses as needed for chest pain. 10/07/12  Yes Edmisten, Brooke O, PA-C  Polyethyl Glycol-Propyl Glycol (SYSTANE OP) Apply 1 drop to eye as needed (dry eye).   Yes [provider]  potassium chloride (KLOR-CON M) 10 MEQ tablet Take 0.5 tablets (5 mEq total) by mouth daily. 07/09/22  Yes Duke, Tami Lin, PA  spironolactone (ALDACTONE) 25 MG tablet Take 0.5 tablets (12.5 mg total) by mouth daily. 06/21/22  Yes Mercy Riding, MD  torsemide (DEMADEX) 20 MG tablet Take 1 tablet (20 mg total) by mouth daily. Take one tablet every other day 06/20/22  Yes Gonfa, Bretta Bang T, MD  ammonium lactate (AMLACTIN) 12 % cream Apply topically 2 (two) times daily. 06/07/22   [provider]  glipiZIDE (GLUCOTROL) 5 MG tablet Take 2.5 mg by mouth daily. Patient not taking: Reported on 10/17/2022 05/11/22   [provider]    Inpatient Medications: Scheduled Meds:  amiodarone  100 mg Oral Daily   aspirin EC  81 mg Oral Daily   insulin aspart  0-6 Units Subcutaneous TID WC   levothyroxine  88 mcg Oral QAC breakfast   Continuous Infusions:  sodium chloride 10 mL/hr at 10/17/22 2149   cefTRIAXone (ROCEPHIN)  IV 2 g (10/17/22 2117)   furosemide (LASIX) 200 mg in dextrose 5 % 100 mL (2 mg/mL) infusion 4 mg/hr (10/17/22 2255)   heparin 1,100 Units/hr (10/17/22 2300)   metronidazole 500 mg (10/18/22 0602)   PRN Meds: sodium chloride, acetaminophen **OR** acetaminophen, fentaNYL (SUBLIMAZE) injection, melatonin, naLOXone (NARCAN)  injection, ondansetron (ZOFRAN) IV  Allergies:   No Known Allergies  Social History:   Social History   Socioeconomic History   Marital status: Widowed    Spouse name: Not on file   Number of children: Not on file   Years of education: college- BS   Highest education level: Bachelor's degree (e.g., BA, AB, BS)  Occupational History   Occupation: retired    Comment: was in [employer]  Tobacco Use   Smoking status: Never   Smokeless  tobacco: Never  Vaping Use   Vaping Use: Never used  Substance and Sexual Activity   Alcohol use: No    Alcohol/week: 0.0 standard drinks of alcohol   Drug use: No   Sexual activity: Not on file  Other Topics Concern   Not on file  Social History Narrative   wife passed in January 2021   son Franklin Gonzalez is the primary care giver prn   Retired from [employer] with a BS degree   Social Determinants of Health   Financial Resource Strain: Low Risk  (08/17/2022)   Overall Financial Resource Strain (CARDIA)    Difficulty of Paying Living Expenses: Not hard at all  Food Insecurity: No Food Insecurity (10/17/2022)   Hunger Vital Sign    Worried About Running Out of Food in the Last Year: Never true    Ran Out of Food in the Last Year: Never true  Transportation Needs: No Transportation Needs (10/17/2022)  PRAPARE - Hydrologist (Medical): No    Lack of Transportation (Non-Medical): No  Physical Activity: Not on file  Stress: No Stress Concern Present (07/25/2020)   [place]    Feeling of Stress : Not at all  Social Connections: Moderately Integrated (07/25/2020)   Social Connection and Isolation Panel [NHANES]    Frequency of Communication with Friends and Family: More than three times a week    Frequency of Social Gatherings with Friends and Family: More than three times a week    Attends Religious Services: 1 to 4 times per year    Active Member of [employer] or Organizations: Yes    Attends Archivist Meetings: 1 to 4 times per year    Marital Status: Widowed  Intimate Partner Violence: Not At Risk (10/17/2022)   Humiliation, Afraid, Rape, and Kick questionnaire    Fear of Current or Ex-Partner: No    Emotionally Abused: No    Physically Abused: No    Sexually Abused: No    Family History:     Family History  Problem Relation Age of Onset   CAD Other        multiple relatives  including father and son   Colon cancer Neg Hx    Colon polyps Neg Hx    Gallbladder disease Neg Hx      ROS:  Please see the history of present illness.  Review of Systems  Constitutional: Positive for weight gain.  HENT: Negative.    Cardiovascular:  Positive for leg swelling.  Respiratory: Negative.    Endocrine: Negative.   Hematologic/Lymphatic: Negative.   Musculoskeletal: Negative.   Gastrointestinal:  Positive for abdominal pain, nausea and vomiting.  Genitourinary: Negative.   Neurological:  Positive for weakness.    All other ROS reviewed and negative.     Physical Exam/Data:   Vitals:   10/17/22 2010 10/18/22 0438 10/18/22 0500 10/18/22 0555  BP: (!) 113/57 105/68    Pulse: 85 (!) 137 (!) 57   Resp: 18 17    Temp: 97.6 F (36.4 C) 97.7 F (36.5 C)    TempSrc: Oral Oral    SpO2: 100% 100%    Weight:    70 kg  Height:        Intake/Output Summary (Last 24 hours) at 10/18/2022 1010 Last data filed at 10/18/2022 0813 Gross per 24 hour  Intake 707.67 ml  Output 600 ml  Net 107.67 ml      10/18/2022    5:55 AM 10/17/2022   12:23 AM 08/16/2022    2:26 PM  Last 3 Weights  Weight (lbs) 154 lb 5.2 oz 143 lb 4.8 oz 142 lb 9.6 oz  Weight (kg) 70 kg 65 kg 64.683 kg     Body mass index is 21.52 kg/m.  General:  Thin,elderly, in no acute distress  HEENT: normal Neck: slight JVD Vascular: No carotid bruits; Distal pulses 2+ bilaterally Cardiac:  normal S1, S2; RRR; no murmur  distant HS Lungs:  decreased breath sounds at bases with few crackles Abd: tender RUQ Ext: slight edema and erythematous Musculoskeletal:  No deformities, BUE and BLE strength normal and equal Skin: warm and dry  Neuro:  CNs 2-12 intact, no focal abnormalities noted Psych:  Normal affect   EKG:  The EKG was personally reviewed and demonstrates:  NSR with first degree AV block, IVCD, PVC, antlat ST changes, no acute change Telemetry:  Telemetry was personally reviewed and  demonstrates:  sinus bradycardia   Relevant CV Studies: Echo 06/2022: 1. Left ventricular ejection fraction, by estimation, is 20 to 25%. The  left ventricle has severely decreased function. The left ventricle  demonstrates global hypokinesis. The left ventricular internal cavity size  was mildly dilated. Left ventricular  diastolic parameters are indeterminate.   2. Right ventricular systolic function is severely reduced. The right  ventricular size is normal. There is moderately elevated pulmonary artery  systolic pressure. The estimated right ventricular systolic pressure is  [id number] mmHg.   3. Left atrial size was severely dilated.   4. Right atrial size was severely dilated.   5. The mitral valve is normal in structure. Trivial mitral valve  regurgitation. No evidence of mitral stenosis.   6. Tricuspid valve regurgitation is moderate to severe.   7. The aortic valve was not well visualized. Aortic valve regurgitation  is trivial. Aortic valve sclerosis/calcification is present, without any  evidence of aortic stenosis.   8. The inferior vena cava is dilated in size with <50% respiratory  variability, suggesting right atrial pressure of 15 mmHg.   Laboratory Data:  Chemistry Recent Labs  Lab 10/17/22 0035 10/17/22 0553 10/18/22 0444  NA 138 137 139  K 5.0 5.0 4.2  CL 109 109 111  CO2 19* 20* 19*  GLUCOSE 289* 261* 89  BUN 64* 66* 58*  CREATININE 1.92* 1.95* 1.68*  CALCIUM 8.1* 8.6* 8.4*  MG  --  2.6*  --   GFRNONAA 31* 31* 37*  ANIONGAP 10 8 9    $ Recent Labs  Lab 10/17/22 0035 10/17/22 0553 10/18/22 0444  PROT 5.5* 5.5* 5.2*  ALBUMIN 3.2* 3.3* 3.0*  AST 501* 366* 103*  ALT 483* 471* 279*  ALKPHOS 117 112 93  BILITOT 0.7 0.6 0.7   Hematology Recent Labs  Lab 10/17/22 0035 10/17/22 0553 10/18/22 0444  WBC 8.2 7.8 6.7  RBC 3.74* 3.90* 3.87*  HGB 11.8* 12.2* 12.2*  HCT 36.5* 38.0* 38.0*  MCV 97.6 97.4 98.2  MCH 31.6 31.3 31.5  MCHC 32.3 32.1 32.1  RDW  14.8 14.9 15.0  PLT 111* 114* 114*   Thyroid  Recent Labs  Lab 10/17/22 0738  TSH 0.676    BNP Recent Labs  Lab 10/17/22 0738  BNP 3,338.0*     Radiology/Studies:  US Venous Img Lower Bilateral (DVT)  Result Date: 10/17/2022 CLINICAL DATA:  Bilateral lower extremity edema. EXAM: BILATERAL LOWER EXTREMITY VENOUS DOPPLER ULTRASOUND TECHNIQUE: Gray-scale sonography with graded compression, as well as color Doppler and duplex ultrasound were performed to evaluate the lower extremity deep venous systems from the level of the common femoral vein and including the common femoral, femoral, profunda femoral, popliteal and calf veins including the posterior tibial, peroneal and gastrocnemius veins when visible. The superficial great saphenous vein was also interrogated. Spectral Doppler was utilized to evaluate flow at rest and with distal augmentation maneuvers in the common femoral, femoral and popliteal veins. COMPARISON:  Right lower extremity venous Doppler ultrasound 06/16/2022 FINDINGS: RIGHT LOWER EXTREMITY Common Femoral Vein: No evidence of thrombus. Normal compressibility, respiratory phasicity and response to augmentation. Saphenofemoral Junction: No evidence of thrombus. Normal compressibility and flow on color Doppler imaging. Profunda Femoral Vein: No evidence of thrombus. Normal compressibility and flow on color Doppler imaging. Femoral Vein: No evidence of thrombus. Normal compressibility, respiratory phasicity and response to augmentation. Popliteal Vein: Noncompressible with nonocclusive thrombus. Calf Veins: No evidence of thrombus. Normal compressibility and flow on  color Doppler imaging. Superficial Great Saphenous Vein: No evidence of thrombus. Normal compressibility. Venous Reflux:  None. Other Findings:  None. LEFT LOWER EXTREMITY Common Femoral Vein: No evidence of thrombus. Normal compressibility, respiratory phasicity and response to augmentation. Saphenofemoral Junction: No  evidence of thrombus. Normal compressibility and flow on color Doppler imaging. Profunda Femoral Vein: No evidence of thrombus. Normal compressibility and flow on color Doppler imaging. Femoral Vein: No evidence of thrombus. Normal compressibility, respiratory phasicity and response to augmentation. Popliteal Vein: No evidence of thrombus. Normal compressibility, respiratory phasicity and response to augmentation. Calf Veins: No evidence of thrombus. Normal compressibility and flow on color Doppler imaging. Superficial Great Saphenous Vein: No evidence of thrombus. Normal compressibility. Venous Reflux:  None. Other Findings:  None. IMPRESSION: 1. Positive for nonocclusive DVT in the right popliteal vein. 2. No evidence of DVT in the left lower extremity. Electronically Signed   By: Logan Bores M.D.   On: 10/17/2022 11:35   US Abdomen Limited RUQ (LIVER/GB)  Result Date: 10/17/2022 CLINICAL DATA:  Nausea and vomiting EXAM: ULTRASOUND ABDOMEN LIMITED RIGHT UPPER QUADRANT COMPARISON:  CT scan of the abdomen and pelvis performed earlier today FINDINGS: Gallbladder: Echogenic material within the gallbladder lumen consistent with cholelithiasis. There is evidence of posterior acoustic shadowing. The gallbladder wall is diffusely thickened measuring up to 7 mm. Per the sonographer, the sonographic Percell Miller sign was negative. Trace pericholecystic fluid is visualized. Common bile duct: Diameter: Normal at 6 mm. Liver: No focal lesion identified. Within normal limits in parenchymal echogenicity. Portal vein is patent on color Doppler imaging with normal direction of blood flow towards the liver. Other: None. IMPRESSION: Cholelithiasis with diffuse gallbladder wall thickening and pericholecystic fluid. In the appropriate clinical setting, findings are concerning for acute calculus cholecystitis. Electronically Signed   By: Jacqulynn Cadet M.D.   On: 10/17/2022 09:04   DG Chest Port 1 View  Result Date:  10/17/2022 CLINICAL DATA:  Congestive heart failure. EXAM: PORTABLE CHEST 1 VIEW COMPARISON:  06/16/2022 FINDINGS: 0645 hours. The cardio pericardial silhouette is enlarged. Interstitial markings are diffusely coarsened with chronic features. Bibasilar atelectasis with small bilateral pleural effusions evident. Airspace disease at the right base not excluded. Bones are diffusely demineralized. Telemetry leads overlie the chest. IMPRESSION: Bibasilar atelectasis with small bilateral pleural effusions, right greater than left. Right basilar pneumonia cannot be excluded. Electronically Signed   By: Misty Stanley M.D.   On: 10/17/2022 06:55   CT ABDOMEN PELVIS W CONTRAST  Result Date: 10/17/2022 CLINICAL DATA:  Abdominal pain, acute, nonlocalized, vomiting EXAM: CT ABDOMEN AND PELVIS WITH CONTRAST TECHNIQUE: Multidetector CT imaging of the abdomen and pelvis was performed using the standard protocol following bolus administration of intravenous contrast. RADIATION DOSE REDUCTION: This exam was performed according to the departmental dose-optimization program which includes automated exposure control, adjustment of the mA and/or kV according to patient size and/or use of iterative reconstruction technique. CONTRAST:  30m OMNIPAQUE IOHEXOL 300 MG/ML  SOLN COMPARISON:  None Available. FINDINGS: Lower chest: Mild cardiomegaly. Extensive coronary artery calcification. Moderate right and trace left pleural effusions are present with associated bibasilar compressive atelectasis. Small hiatal hernia. Hepatobiliary: There is marked periportal edema which may relate to congestive hepatopathy, but is nonspecific. Scattered hypodensities within the liver are nonspecific, possibly representing small hepatic cysts or hemangioma in a patient without a history of malignancy. No enhancing intrahepatic mass. No intra or extrahepatic biliary ductal dilation. Cholelithiasis noted. Pancreas: Innumerable cystic lesions arise from the  body and tail the pancreas  likely representing multiple dilated pancreatic side branches or, less likely, multifocal intraductal pancreatic mucinous neoplasm. The largest cystic lesion measures 2.6 cm at axial image # 17/2. No peripancreatic inflammatory changes identified. Spleen: Unremarkable Adrenals/Urinary Tract: The adrenal glands are unremarkable. The kidneys are normal in size and position. There is a 17 mm hyperdense lesion arising from the lower pole of the left kidney measuring 78 Hounsfield units on early arterial phase imaging and 97 Hounsfield units on delayed phase imaging in keeping with a primary renal neoplasm. No hydronephrosis. No intrarenal or ureteral calculi. The bladder is unremarkable. Stomach/Bowel: Mild ascites. The stomach, small bowel, and large bowel are otherwise unremarkable. The appendix is normal. No free intraperitoneal gas. Vascular/Lymphatic: No pathologic adenopathy within the abdomen and pelvis. Extensive aortoiliac atherosclerotic calcification. No aortic aneurysm. Reproductive: Marked prostatic enlargement. Other: Small bilateral inguinal hernias are present. There is mild diffuse subcutaneous body wall edema. Musculoskeletal: Osseous structures are diffusely osteopenic. No acute bone abnormality. Mild remote appearing compression deformity of L1. IMPRESSION: 1. No acute intra-abdominal pathology identified. No definite radiographic explanation for the patient's reported symptoms. 2. Mild cardiomegaly. Extensive coronary artery calcification. 3. Moderate right and trace left pleural effusions, mild ascites, and mild diffuse subcutaneous body wall edema in keeping with mild anasarca. 4. Marked periportal edema, nonspecific, but possibly related to congestive hepatopathy given the above findings. Acute inflammatory conditions of the liver or biliary tree, however, could appear similarly and correlation with liver enzymes is recommended. 5. Cholelithiasis. 6. Innumerable cystic  lesions arising from the body and tail the pancreas likely representing multiple dilated pancreatic side branches or, less likely, multifocal intraductal pancreatic mucinous neoplasm. The largest cystic lesion measures 2.6 cm. If clinically indicated, follow-up MRI examination in 6 months is recommended to document stability. 7. 17 mm hyperdense lesion arising from the lower pole of the left kidney in keeping with a primary renal neoplasm. Urologic consultation may be helpful for further management. 8. Marked prostatic enlargement. Aortic Atherosclerosis (ICD10-I70.0). Electronically Signed   By: Fidela Salisbury M.D.   On: 10/17/2022 02:01     Assessment and Plan:   HFrEF, acute on chronic biventricular CHF, EF 20-25% on echo 06/2022, BNP 3338, CXR bilateral effusions. On IV lasix drip 59m/hr. I/O's +600. Patient on O2 3L and comfortable. Would continue IV lasix drip at this time.  -losartan 50 mg daily, aldactone 12.5 mg, torsemide 20 mg every other day PTA  all on hold due to soft BP's and AKI.   Acute RLE DVT on IV heparin  Acute cholecystitis on rocephin and flagyl-will be high risk for surgery if needed.  SVT on amiodarone   Risk Assessment/Risk Scores:        New York Heart Association (NYHA) Functional Class NYHA Class III        For questions or updates, please contact C[place]usselsPlease consult [email or web address] for contact info under    Signed, MErmalinda Barrios PA-C 10/18/2022 10:10 AM    Attending note:  Patient seen and examined.  I reviewed his records and discussed the case with Ms. LVita Barley I agree with her above findings.  Mr. RPumapresents with comorbid illnesses including weight gain with suspected mild exacerbation of HFrEF, newly diagnosed right leg DVT, and suspected cholecystitis.  He had had a 4 pound weight gain at home with plan to use additional torsemide.  Began to develop back pain and emesis which were main reasons for his presentation, not  necessarily shortness of breath or leg pain.  Also no chest pain.  At home he is on losartan, Aldactone, and every other day dose torsemide with potassium supplement.  No beta-blocker with history of symptomatic bradycardia.  On examination this morning he appears comfortable at rest.  Afebrile.  Heart rate 50s to 60s in sinus rhythm, blood pressure 105/68.  Lungs exhibit decreased breath sounds at the bases without wheezing.  Cardiac exam with indistinct PMI, RRR and soft systolic murmur.  Abdomen nontender.  He has mild lower leg edema, right greater than left.  Pertinent lab work includes potassium 4.2, BUN 58, creatinine 1.68, AST 103, ALT 279, WBC 6.7, hemoglobin 12.2, platelets 114, BNP 3338.  Lower extremity venous Dopplers positive for DVT in right popliteal vein.  Abdominal CT and ultrasound show cholelithiasis with question of cholecystitis as well.  HIDA scan pending.  Echocardiogram from October 2023 revealed LVEF 20 to 25%, severe RV dysfunction as well.  Acute on chronic HFrEF with recent fluid gain, also pleural effusions by chest imaging.  Presently on low-dose Lasix infusion as he undergoes workup of his gallbladder.  Holding Aldactone and losartan at this time given renal insufficiency and current blood pressure trend.  He is also on IV heparin, ultimately anticipate transition to Eliquis for treatment of DVT.  Follow-up echocardiogram ordered by hospitalist team.  From the perspective of perioperative cardiac risk, his is relatively high and this is not a modifiable risk.  If he does require surgery would suggest transfer to [facility].  Satira Sark, M.D., F.A.C.C.

## 2022-10-18 NOTE — Progress Notes (Signed)
ANTICOAGULATION CONSULT NOTE   Pharmacy Consult for heparin Indication: DVT  No Known Allergies  Patient Measurements: Height: 5' 11"$  (180.3 cm) Weight: 65 kg (143 lb 4.8 oz) IBW/kg (Calculated) : 75.3 Heparin Dosing Weight: 65  Vital Signs: Temp: 97.6 F (36.4 C) (02/11 2010) Temp Source: Oral (02/11 2010) BP: 113/57 (02/11 2010) Pulse Rate: 85 (02/11 2010)  Labs: Recent Labs    10/17/22 0035 10/17/22 0553 10/17/22 0738 10/17/22 2313  HGB 11.8* 12.2*  --   --   HCT 36.5* 38.0*  --   --   PLT 111* 114*  --   --   LABPROT  --   --  15.9*  --   INR  --   --  1.3*  --   HEPARINUNFRC  --   --   --  0.54  CREATININE 1.92* 1.95*  --   --      Estimated Creatinine Clearance: 19.4 mL/min (A) (by C-G formula based on SCr of 1.95 mg/dL (H)).   Medical History: Past Medical History:  Diagnosis Date   Bifascicular block    Coronary artery disease    CABG 1993 at [facility]; NSTEMI 09/2012 s/p DES to SVG-OM1, DES to SVG-PDA    DM (diabetes mellitus) (HCC)    HTN (hypertension)    SVT (supraventricular tachycardia)     Medications:  Medications Prior to Admission  Medication Sig Dispense Refill Last Dose   amiodarone (PACERONE) 200 MG tablet Take 1/2 (one-half) tablet by mouth once daily 45 tablet 3 10/17/2022   aspirin EC 81 MG EC tablet Take 1 tablet (81 mg total) by mouth daily.   10/17/2022   atorvastatin (LIPITOR) 10 MG tablet Take 10 mg by mouth at bedtime.   10/16/2022   dorzolamide-timolol (COSOPT) 22.3-6.8 MG/ML ophthalmic solution Place 1 drop into both eyes 2 (two) times daily.   10/16/2022   doxazosin (CARDURA) 4 MG tablet Take 2 mg by mouth at bedtime.   10/16/2022   glipiZIDE (GLUCOTROL XL) 2.5 MG 24 hr tablet Take 2.5 mg by mouth every morning.   10/16/2022   latanoprost (XALATAN) 0.005 % ophthalmic solution Place 1 drop into both eyes every evening.   10/16/2022   levothyroxine (SYNTHROID) 88 MCG tablet Take 88 mcg by mouth daily.   10/16/2022    levothyroxine (SYNTHROID) 88 MCG tablet Take 88 mcg by mouth daily before breakfast.   10/17/2022   losartan (COZAAR) 50 MG tablet Take 1 tablet by mouth once daily 90 tablet 3 10/16/2022   nitroGLYCERIN (NITROSTAT) 0.4 MG SL tablet Place 1 tablet (0.4 mg total) under the tongue every 5 (five) minutes x 3 doses as needed for chest pain. 60 tablet 3 unknown   Polyethyl Glycol-Propyl Glycol (SYSTANE OP) Apply 1 drop to eye as needed (dry eye).   unknown   potassium chloride (KLOR-CON M) 10 MEQ tablet Take 0.5 tablets (5 mEq total) by mouth daily. 90 tablet 1 10/16/2022   spironolactone (ALDACTONE) 25 MG tablet Take 0.5 tablets (12.5 mg total) by mouth daily. 30 tablet 1 10/16/2022   torsemide (DEMADEX) 20 MG tablet Take 1 tablet (20 mg total) by mouth daily. Take one tablet every other day 30 tablet 2 10/16/2022   ammonium lactate (AMLACTIN) 12 % cream Apply topically 2 (two) times daily.      glipiZIDE (GLUCOTROL) 5 MG tablet Take 2.5 mg by mouth daily. (Patient not taking: Reported on 10/17/2022)   Not Taking    Assessment: Pharmacy consulted to dose heparin  in patient with right leg DVT.  Patient is not on anticoagulation prior to admission.  Hgb 12.2 Platelets 114  2/12 AM update:  Heparin level therapeutic   Goal of Therapy:  Heparin level 0.3-0.7 units/ml Monitor platelets by anticoagulation protocol: Yes   Plan:  Cont heparin 1100 units/hr Heparin level with AM labs  Narda Bonds, PharmD, [place] Pharmacist Phone: [phone]

## 2022-10-18 NOTE — Progress Notes (Addendum)
[facility]  Heart failure patient with some RUQ pain and fluid around the gallbladder. I worry about hepatic congestion and edema from the heart failure. I am less suspicious of cholecystitis based on the LFTs being up but normal alk phos and tbili.   I think he needs a HIDA scan. GI consult pending. HIDA to be done, will see patient after.   Curlene Labrum, MD [facility] [address] [place], [place] [address] [phone] (office)

## 2022-10-18 NOTE — TOC Initial Note (Signed)
Transition of Care [facility]) - Initial/Assessment Note    Patient Details  Name: Franklin Gonzalez MRN: [id number] Date of Birth: [birth year]  [facility]) CM/SW Contact:    Shade Flood, LCSW Phone Number: 10/18/2022, 11:40 AM  Clinical Narrative:                  Pt admitted from home. Pt has a high readmission risk score. Met with pt and his son at bedside to review dc planning. Pt lives in his own home. He has a private duty caregiver M-F from 8Am to [place] and again for a couple hours at bedtime. Pt's son provides care on the weekends.  Pt is mostly independent in ADLs at home. His aide/son do most of the cooking. Pt's son transports pt to appointments and assists in getting his medications. Pt uses a walker for his only DME. Pt has had Hh in the past but son cannot remember which provider.  No immediate needs identified at this time. Will follow and continue to assess and assist with dc planning.  Expected Discharge Plan: [place] Barriers to Discharge: Continued Medical Work up   Patient Goals and CMS Choice Patient states their goals for this hospitalization and ongoing recovery are:: get better          Expected Discharge Plan and Services In-house Referral: Clinical Social Work     Living arrangements for the past 2 months: Single Family Home                                      Prior Living Arrangements/Services Living arrangements for the past 2 months: Single Family Home Lives with:: Self Patient language and need for interpreter reviewed:: Yes Do you feel safe going back to the place where you live?: Yes      Need for Family Participation in Patient Care: Yes (Comment) Care giver support system in place?: Yes (comment) Current home services: DME, Meals on wheels, Other (comment) (Private duty aide) Criminal Activity/Legal Involvement Pertinent to Current Situation/Hospitalization: No - Comment as needed  Activities of Daily  Living Home Assistive Devices/Equipment: Environmental consultant (specify type) ADL Screening (condition at time of admission) Patient's cognitive ability adequate to safely complete daily activities?: Yes Is the patient deaf or have difficulty hearing?: Yes Does the patient have difficulty seeing, even when wearing glasses/contacts?: No Does the patient have difficulty concentrating, remembering, or making decisions?: No Patient able to express need for assistance with ADLs?: Yes Does the patient have difficulty dressing or bathing?: No Independently performs ADLs?: No Communication: Needs assistance Is this a change from baseline?: Change from baseline, expected to last <3 days Dressing (OT): Needs assistance Is this a change from baseline?: Change from baseline, expected to last <3days Grooming: Needs assistance Is this a change from baseline?: Change from baseline, expected to last <3 days Feeding: Needs assistance Is this a change from baseline?: Change from baseline, expected to last <3 days Bathing: Needs assistance Is this a change from baseline?: Change from baseline, expected to last <3 days Toileting: Independent with device (comment) In/Out Bed: Needs assistance Is this a change from baseline?: Change from baseline, expected to last <3 days Does the patient have difficulty walking or climbing stairs?: Yes Weakness of Legs: Both Weakness of Arms/Hands: Both  Permission Sought/Granted  Emotional Assessment Appearance:: Appears younger than stated age Attitude/Demeanor/Rapport: Engaged Affect (typically observed): Pleasant Orientation: : Oriented to Self, Oriented to Place, Oriented to  Time, Oriented to Situation Alcohol / Substance Use: Not Applicable Psych Involvement: No (comment)  Admission diagnosis:  Elevated LFTs [R79.89] Intractable nausea and vomiting [R11.2] Nausea and vomiting, unspecified vomiting type [R11.2] Acute exacerbation of CHF (congestive  heart failure) (HCC) [I50.9] Patient Active Problem List   Diagnosis Date Noted   Intractable nausea and vomiting 10/17/2022   Transaminitis 10/17/2022   Acute renal failure superimposed on stage 3b chronic kidney disease ([place]) 10/17/2022   Acquired hypothyroidism 10/17/2022   History of anemia due to chronic kidney disease 10/17/2022   Acute exacerbation of CHF (congestive heart failure) ([place]) 10/17/2022   DNR (do not resuscitate) 06/20/2022   Biventricular heart failure ([place]) 06/18/2022   Moderate tricuspid valve regurgitation 06/18/2022   Acute on chronic congestive heart failure ([place]) 06/16/2022   Prolonged QT interval 06/16/2022   Benign essential hypertension 07/24/2020   Pure hypercholesterolemia 07/24/2020   DM2 (diabetes mellitus, type 2) ([place]) 07/24/2020   Acute on chronic combined systolic and diastolic CHF (congestive heart failure) ([place]) 06/28/2020   Acute respiratory failure with hypoxia ([place]) 06/27/2020   Acute on chronic systolic CHF (congestive heart failure) ([place]) 06/27/2020   Chronic renal insufficiency, stage III (moderate) ([place]) 06/27/2020   Hyponatremia 06/27/2020   Acute CHF (congestive heart failure) ([place]) 06/26/2020   Cerebrovascular disease 04/10/2013   CAD (coronary artery disease) 10/27/2012   Bruit 10/27/2012   Hyperlipidemia 10/27/2012   Fever 10/07/2012   Anemia, normocytic normochromic 10/07/2012   SVT (supraventricular tachycardia) ([place]) 10/03/2012   Fasting hyperglycemia    Essential hypertension    Acute myocardial infarction, subendocardial infarction, initial episode of care ([place]) 10/02/2012   PCP:  Hearne Nation, MD Pharmacy:   [facility] [address] [place] - [address] [place] [address] Phone: [phone] Fax: [phone]     Social Determinants of Health (SDOH) Social History: SDOH Screenings   Food Insecurity: No Food Insecurity (10/17/2022)  Housing: Low Risk  (10/17/2022)  Transportation Needs: No  Transportation Needs (10/17/2022)  Utilities: Not At Risk (10/17/2022)  Depression (PHQ2-9): Low Risk  (07/25/2020)  Financial Resource Strain: Low Risk  (08/17/2022)  Social Connections: Moderately Integrated (07/25/2020)  Stress: No Stress Concern Present (07/25/2020)  Tobacco Use: Low Risk  (10/18/2022)   SDOH Interventions:     Readmission Risk Interventions    10/18/2022   11:39 AM 06/18/2022    4:40 PM  Readmission Risk Prevention Plan  Transportation Screening Complete Complete  PCP or Specialist Appt within 3-5 Days  Not Complete  HRI or [place] Complete Complete  Social Work Consult for [place] Planning/Counseling Complete Complete  Palliative Care Screening Not Applicable Complete  Medication Review Press photographer) Complete Complete

## 2022-10-18 NOTE — Consult Note (Signed)
Gastroenterology Consult   Referring Provider: Dr. Roxan Hockey Primary Care Physician:  [place] Nation, MD Primary Gastroenterologist:  Dr. Abbey Chatters, previously unassigned  Patient ID: Franklin Gonzalez; [id number]; [birth year]   Admit date: 10/17/2022  LOS: 1 day   Date of Consultation: 10/18/2022  Reason for Consultation:  Elevated transaminases   History of Present Illness   Franklin Gonzalez is a 89 y.o. year old male who lives alone with past medical history of CAD, carotid artery stenosis, HTN, hyperlipidemia, diabetes, cardiomyopathy, heart failure, EF 20-25% in Oct 2023, now admitted with concern for acute cholecystitis, acute on chronic HF, findings of acute RLE DVT. GI consulted due to new onset elevated transaminases.   In the ED: Tbili normal, AST 501, ALT 482, no leukocytosis, Hgb 11.8, similar to baseline, platelets 111, negative acute hepatitis panel, INR 1.3, TSH 0.676. Lipase normal. CT abd/pelvis with contrast with marked periportal edema, scattered non-specific hypodensities within the liver, cholelithiasis, innumerable pancreatic cystic lesions with largest measuring 2.6 cm, and 17 mm hyperdense lesion arising from left kidney concerning for primary neoplasm. Doppler US RLE with non-occlusive DVT in right popliteal vein. US abdomen with cholelithiasis and diffuse gallbladder wall thickening and pericholecystic fluid.   Son at bedside and provides the majority of information. Patient had been in usual state of health. Notes on Saturday afternoon, ate [employer], then around  7/730pm had pain in lower back, vomited later in the evening. Green emesis. Family called EMS, and he was transported to [facility]. No prior issues with this. No sick contacts. Son was concerned about food illness at first. No abdominal pain.   Lives alone. Has aides that come through the day and at night. Son takes care of him on weekends.     Past Medical History:  Diagnosis Date    Bifascicular block    Coronary artery disease    CABG 1993 at [facility]; NSTEMI 09/2012 s/p DES to SVG-OM1, DES to SVG-PDA    DM (diabetes mellitus) ([place])    HTN (hypertension)    SVT (supraventricular tachycardia)     Past Surgical History:  Procedure Laterality Date   [place] CABG   LEFT HEART CATHETERIZATION WITH CORONARY/GRAFT ANGIOGRAM N/A 10/04/2012   Procedure: LEFT HEART CATHETERIZATION WITH Beatrix Fetters;  Surgeon: Sherren Mocha, MD;  Location: [facility] CATH LAB;  Service: Cardiovascular;  Laterality: N/A;    Prior to Admission medications   Medication Sig Start Date End Date Taking? Authorizing Provider  amiodarone (PACERONE) 200 MG tablet Take 1/2 (one-half) tablet by mouth once daily 12/22/21  Yes Crenshaw, Denice Bors, MD  aspirin EC 81 MG EC tablet Take 1 tablet (81 mg total) by mouth daily. 10/07/12  Yes Edmisten, Brooke O, PA-C  atorvastatin (LIPITOR) 10 MG tablet Take 10 mg by mouth at bedtime.   Yes [provider]  dorzolamide-timolol (COSOPT) 22.3-6.8 MG/ML ophthalmic solution Place 1 drop into both eyes 2 (two) times daily. 09/21/21  Yes [provider]  doxazosin (CARDURA) 4 MG tablet Take 2 mg by mouth at bedtime.   Yes [provider]  glipiZIDE (GLUCOTROL XL) 2.5 MG 24 hr tablet Take 2.5 mg by mouth every morning. 09/22/22  Yes [provider]  latanoprost (XALATAN) 0.005 % ophthalmic solution Place 1 drop into both eyes every evening. 10/17/21  Yes [provider]  levothyroxine (SYNTHROID) 88 MCG tablet Take 88 mcg by mouth daily. 11/15/21  Yes [provider]  levothyroxine (SYNTHROID) 88 MCG tablet  Take 88 mcg by mouth daily before breakfast.   Yes [provider]  losartan (COZAAR) 50 MG tablet Take 1 tablet by mouth once daily 07/12/22  Yes Crenshaw, Denice Bors, MD  nitroGLYCERIN (NITROSTAT) 0.4 MG SL tablet Place 1 tablet (0.4 mg total) under the tongue every 5 (five) minutes x 3  doses as needed for chest pain. 10/07/12  Yes Edmisten, Brooke O, PA-C  Polyethyl Glycol-Propyl Glycol (SYSTANE OP) Apply 1 drop to eye as needed (dry eye).   Yes [provider]  potassium chloride (KLOR-CON M) 10 MEQ tablet Take 0.5 tablets (5 mEq total) by mouth daily. 07/09/22  Yes Duke, Tami Lin, PA  spironolactone (ALDACTONE) 25 MG tablet Take 0.5 tablets (12.5 mg total) by mouth daily. 06/21/22  Yes Mercy Riding, MD  torsemide (DEMADEX) 20 MG tablet Take 1 tablet (20 mg total) by mouth daily. Take one tablet every other day 06/20/22  Yes Gonfa, Bretta Bang T, MD  ammonium lactate (AMLACTIN) 12 % cream Apply topically 2 (two) times daily. 06/07/22   [provider]  glipiZIDE (GLUCOTROL) 5 MG tablet Take 2.5 mg by mouth daily. Patient not taking: Reported on 10/17/2022 05/11/22   [provider]    Current Facility-Administered Medications  Medication Dose Route Frequency Provider Last Rate Last Admin   0.9 %  sodium chloride infusion   Intravenous PRN Roxan Hockey, MD 10 mL/hr at 10/17/22 2149 Restarted at 10/17/22 2149   acetaminophen (TYLENOL) tablet 650 mg  650 mg Oral Q6H PRN Howerter, Justin B, DO       Or   acetaminophen (TYLENOL) suppository 650 mg  650 mg Rectal Q6H PRN Howerter, Justin B, DO       amiodarone (PACERONE) tablet 100 mg  100 mg Oral Daily Howerter, Justin B, DO   100 mg at 10/17/22 1153   aspirin EC tablet 81 mg  81 mg Oral Daily Howerter, Justin B, DO   81 mg at 10/17/22 1154   cefTRIAXone (ROCEPHIN) 2 g in sodium chloride 0.9 % 100 mL IVPB  2 g Intravenous Q24H Emokpae, Courage, MD 200 mL/hr at 10/17/22 2117 2 g at 10/17/22 2117   fentaNYL (SUBLIMAZE) injection 25 mcg  25 mcg Intravenous Q2H PRN Howerter, Justin B, DO       furosemide (LASIX) 200 mg in dextrose 5 % 100 mL (2 mg/mL) infusion  4 mg/hr Intravenous Continuous Emokpae, Courage, MD 2 mL/hr at 10/17/22 2255 4 mg/hr at 10/17/22 2255   heparin ADULT infusion 100 units/mL (25000  units/279m)  1,100 Units/hr Intravenous Continuous Emokpae, Courage, MD 11 mL/hr at 10/17/22 2300 1,100 Units/hr at 10/17/22 2300   insulin aspart (novoLOG) injection 0-6 Units  0-6 Units Subcutaneous TID WC Howerter, Justin B, DO   1 Units at 10/17/22 1801   levothyroxine (SYNTHROID) tablet 88 mcg  88 mcg Oral QAC breakfast Howerter, Justin B, DO   88 mcg at 10/18/22 0602   melatonin tablet 3 mg  3 mg Oral QHS PRN Howerter, Justin B, DO       metroNIDAZOLE (FLAGYL) IVPB 500 mg  500 mg Intravenous Q12H Emokpae, Courage, MD 100 mL/hr at 10/18/22 0602 500 mg at 10/18/22 0602   naloxone (NARCAN) injection 0.4 mg  0.4 mg Intravenous PRN Howerter, Justin B, DO       ondansetron (ZOFRAN) injection 4 mg  4 mg Intravenous Q6H PRN Howerter, Justin B, DO        Allergies as of 10/17/2022   (No Known Allergies)  Family History  Problem Relation Age of Onset   CAD Other        multiple relatives including father and son    Social History   Socioeconomic History   Marital status: Widowed    Spouse name: Not on file   Number of children: Not on file   Years of education: college- BS   Highest education level: Bachelor's degree (e.g., BA, AB, BS)  Occupational History   Occupation: retired    Comment: was in [employer]  Tobacco Use   Smoking status: Never   Smokeless tobacco: Never  Vaping Use   Vaping Use: Never used  Substance and Sexual Activity   Alcohol use: No    Alcohol/week: 0.0 standard drinks of alcohol   Drug use: No   Sexual activity: Not on file  Other Topics Concern   Not on file  Social History Narrative   wife passed in January 2021   son Franklin Gonzalez is the primary care giver prn   Retired from [employer] with a BS degree   Social Determinants of Health   Financial Resource Strain: Low Risk  (08/17/2022)   Overall Financial Resource Strain (CARDIA)    Difficulty of Paying Living Expenses: Not hard at all  Food Insecurity: No Food Insecurity (10/17/2022)   Hunger Vital  Sign    Worried About Running Out of Food in the Last Year: Never true    [place] in the Last Year: Never true  Transportation Needs: No Transportation Needs (10/17/2022)   PRAPARE - Hydrologist (Medical): No    Lack of Transportation (Non-Medical): No  Physical Activity: Not on file  Stress: No Stress Concern Present (07/25/2020)   [place]    Feeling of Stress : Not at all  Social Connections: Moderately Integrated (07/25/2020)   Social Connection and Isolation Panel [NHANES]    Frequency of Communication with Friends and Family: More than three times a week    Frequency of Social Gatherings with Friends and Family: More than three times a week    Attends Religious Services: 1 to 4 times per year    Active Member of [employer] or Organizations: Yes    Attends Archivist Meetings: 1 to 4 times per year    Marital Status: Widowed  Intimate Partner Violence: Not At Risk (10/17/2022)   Humiliation, Afraid, Rape, and Kick questionnaire    Fear of Current or Ex-Partner: No    Emotionally Abused: No    Physically Abused: No    Sexually Abused: No     Review of Systems   Gen: Denies any fever, chills, loss of appetite, change in weight or weight loss CV: Denies chest pain, heart palpitations, syncope, +lower extremity edema  Resp: Denies shortness of breath with rest, cough, wheezing, coughing up blood, and pleurisy. GI: Denies vomiting blood, jaundice, and fecal incontinence.   Denies dysphagia or odynophagia. GU : Denies urinary burning, blood in urine, urinary frequency, and urinary incontinence. MS: Denies joint pain, limitation of movement, swelling, cramps, and atrophy.  Derm: Denies rash, itching, dry skin, hives. Psych: Denies depression, anxiety, memory loss, hallucinations, and confusion. Heme: Denies bruising or bleeding Neuro:  Denies any headaches, dizziness,  paresthesias, shaking  Physical Exam   Vital Signs in last 24 hours: Temp:  [97.5 F (36.4 C)-97.7 F (36.5 C)] 97.7 F (36.5 C) (02/12 0438) Pulse Rate:  [49-137] 57 (02/12 0500)  Resp:  [11-18] 17 (02/12 0438) BP: (103-113)/(56-73) 105/68 (02/12 0438) SpO2:  [100 %] 100 % (02/12 0438) Weight:  [70 kg] 70 kg (02/12 0555) Last BM Date : 10/17/22  General:   Thin, no distress, frail, pleasant Head:  Normocephalic and atraumatic. Eyes:  Sclera clear, no icterus.    Lungs:  slight crackles  Heart:  S1 S2 present, mild bradycardia Abdomen:  Soft, nontender and nondistended. No masses, hepatosplenomegaly or hernias noted. Normal bowel sounds, without guarding, and without rebound.   Rectal: deferred   Extremities:  With RLE calf edema and erythema, no edema LLE Neurologic:  Alert and  oriented x4.   Intake/Output from previous day: 02/11 0701 - 02/12 0700 In: 107.7 [I.V.:107.7] Out: 600 [Urine:600] Intake/Output this shift: Total I/O In: 600 [P.O.:600] Out: -     Labs/Studies   Recent Labs Recent Labs    10/17/22 0035 10/17/22 0553 10/18/22 0444  WBC 8.2 7.8 6.7  HGB 11.8* 12.2* 12.2*  HCT 36.5* 38.0* 38.0*  PLT 111* 114* 114*   BMET Recent Labs    10/17/22 0035 10/17/22 0553 10/18/22 0444  NA 138 137 139  K 5.0 5.0 4.2  CL 109 109 111  CO2 19* 20* 19*  GLUCOSE 289* 261* 89  BUN 64* 66* 58*  CREATININE 1.92* 1.95* 1.68*  CALCIUM 8.1* 8.6* 8.4*   LFT Recent Labs    10/17/22 0035 10/17/22 0553 10/18/22 0444  PROT 5.5* 5.5* 5.2*  ALBUMIN 3.2* 3.3* 3.0*  AST 501* 366* 103*  ALT 483* 471* 279*  ALKPHOS 117 112 93  BILITOT 0.7 0.6 0.7   PT/INR Recent Labs    10/17/22 0738  LABPROT 15.9*  INR 1.3*   Hepatitis Panel Recent Labs    10/17/22 0738  HEPBSAG NON REACTIVE  HCVAB NON REACTIVE  HEPAIGM NON REACTIVE  HEPBIGM NON REACTIVE    Radiology/Studies US Venous Img Lower Bilateral (DVT)  Result Date: 10/17/2022 CLINICAL DATA:   Bilateral lower extremity edema. EXAM: BILATERAL LOWER EXTREMITY VENOUS DOPPLER ULTRASOUND TECHNIQUE: Gray-scale sonography with graded compression, as well as color Doppler and duplex ultrasound were performed to evaluate the lower extremity deep venous systems from the level of the common femoral vein and including the common femoral, femoral, profunda femoral, popliteal and calf veins including the posterior tibial, peroneal and gastrocnemius veins when visible. The superficial great saphenous vein was also interrogated. Spectral Doppler was utilized to evaluate flow at rest and with distal augmentation maneuvers in the common femoral, femoral and popliteal veins. COMPARISON:  Right lower extremity venous Doppler ultrasound 06/16/2022 FINDINGS: RIGHT LOWER EXTREMITY Common Femoral Vein: No evidence of thrombus. Normal compressibility, respiratory phasicity and response to augmentation. Saphenofemoral Junction: No evidence of thrombus. Normal compressibility and flow on color Doppler imaging. Profunda Femoral Vein: No evidence of thrombus. Normal compressibility and flow on color Doppler imaging. Femoral Vein: No evidence of thrombus. Normal compressibility, respiratory phasicity and response to augmentation. Popliteal Vein: Noncompressible with nonocclusive thrombus. Calf Veins: No evidence of thrombus. Normal compressibility and flow on color Doppler imaging. Superficial Great Saphenous Vein: No evidence of thrombus. Normal compressibility. Venous Reflux:  None. Other Findings:  None. LEFT LOWER EXTREMITY Common Femoral Vein: No evidence of thrombus. Normal compressibility, respiratory phasicity and response to augmentation. Saphenofemoral Junction: No evidence of thrombus. Normal compressibility and flow on color Doppler imaging. Profunda Femoral Vein: No evidence of thrombus. Normal compressibility and flow on color Doppler imaging. Femoral Vein: No evidence of thrombus. Normal compressibility, respiratory  phasicity and  response to augmentation. Popliteal Vein: No evidence of thrombus. Normal compressibility, respiratory phasicity and response to augmentation. Calf Veins: No evidence of thrombus. Normal compressibility and flow on color Doppler imaging. Superficial Great Saphenous Vein: No evidence of thrombus. Normal compressibility. Venous Reflux:  None. Other Findings:  None. IMPRESSION: 1. Positive for nonocclusive DVT in the right popliteal vein. 2. No evidence of DVT in the left lower extremity. Electronically Signed   By: Logan Bores M.D.   On: 10/17/2022 11:35   US Abdomen Limited RUQ (LIVER/GB)  Result Date: 10/17/2022 CLINICAL DATA:  Nausea and vomiting EXAM: ULTRASOUND ABDOMEN LIMITED RIGHT UPPER QUADRANT COMPARISON:  CT scan of the abdomen and pelvis performed earlier today FINDINGS: Gallbladder: Echogenic material within the gallbladder lumen consistent with cholelithiasis. There is evidence of posterior acoustic shadowing. The gallbladder wall is diffusely thickened measuring up to 7 mm. Per the sonographer, the sonographic Percell Miller sign was negative. Trace pericholecystic fluid is visualized. Common bile duct: Diameter: Normal at 6 mm. Liver: No focal lesion identified. Within normal limits in parenchymal echogenicity. Portal vein is patent on color Doppler imaging with normal direction of blood flow towards the liver. Other: None. IMPRESSION: Cholelithiasis with diffuse gallbladder wall thickening and pericholecystic fluid. In the appropriate clinical setting, findings are concerning for acute calculus cholecystitis. Electronically Signed   By: Jacqulynn Cadet M.D.   On: 10/17/2022 09:04   DG Chest Port 1 View  Result Date: 10/17/2022 CLINICAL DATA:  Congestive heart failure. EXAM: PORTABLE CHEST 1 VIEW COMPARISON:  06/16/2022 FINDINGS: 0645 hours. The cardio pericardial silhouette is enlarged. Interstitial markings are diffusely coarsened with chronic features. Bibasilar atelectasis with  small bilateral pleural effusions evident. Airspace disease at the right base not excluded. Bones are diffusely demineralized. Telemetry leads overlie the chest. IMPRESSION: Bibasilar atelectasis with small bilateral pleural effusions, right greater than left. Right basilar pneumonia cannot be excluded. Electronically Signed   By: Misty Stanley M.D.   On: 10/17/2022 06:55   CT ABDOMEN PELVIS W CONTRAST  Result Date: 10/17/2022 CLINICAL DATA:  Abdominal pain, acute, nonlocalized, vomiting EXAM: CT ABDOMEN AND PELVIS WITH CONTRAST TECHNIQUE: Multidetector CT imaging of the abdomen and pelvis was performed using the standard protocol following bolus administration of intravenous contrast. RADIATION DOSE REDUCTION: This exam was performed according to the departmental dose-optimization program which includes automated exposure control, adjustment of the mA and/or kV according to patient size and/or use of iterative reconstruction technique. CONTRAST:  65m OMNIPAQUE IOHEXOL 300 MG/ML  SOLN COMPARISON:  None Available. FINDINGS: Lower chest: Mild cardiomegaly. Extensive coronary artery calcification. Moderate right and trace left pleural effusions are present with associated bibasilar compressive atelectasis. Small hiatal hernia. Hepatobiliary: There is marked periportal edema which may relate to congestive hepatopathy, but is nonspecific. Scattered hypodensities within the liver are nonspecific, possibly representing small hepatic cysts or hemangioma in a patient without a history of malignancy. No enhancing intrahepatic mass. No intra or extrahepatic biliary ductal dilation. Cholelithiasis noted. Pancreas: Innumerable cystic lesions arise from the body and tail the pancreas likely representing multiple dilated pancreatic side branches or, less likely, multifocal intraductal pancreatic mucinous neoplasm. The largest cystic lesion measures 2.6 cm at axial image # 17/2. No peripancreatic inflammatory changes  identified. Spleen: Unremarkable Adrenals/Urinary Tract: The adrenal glands are unremarkable. The kidneys are normal in size and position. There is a 17 mm hyperdense lesion arising from the lower pole of the left kidney measuring 78 Hounsfield units on early arterial phase imaging and 97 Hounsfield units on delayed  phase imaging in keeping with a primary renal neoplasm. No hydronephrosis. No intrarenal or ureteral calculi. The bladder is unremarkable. Stomach/Bowel: Mild ascites. The stomach, small bowel, and large bowel are otherwise unremarkable. The appendix is normal. No free intraperitoneal gas. Vascular/Lymphatic: No pathologic adenopathy within the abdomen and pelvis. Extensive aortoiliac atherosclerotic calcification. No aortic aneurysm. Reproductive: Marked prostatic enlargement. Other: Small bilateral inguinal hernias are present. There is mild diffuse subcutaneous body wall edema. Musculoskeletal: Osseous structures are diffusely osteopenic. No acute bone abnormality. Mild remote appearing compression deformity of L1. IMPRESSION: 1. No acute intra-abdominal pathology identified. No definite radiographic explanation for the patient's reported symptoms. 2. Mild cardiomegaly. Extensive coronary artery calcification. 3. Moderate right and trace left pleural effusions, mild ascites, and mild diffuse subcutaneous body wall edema in keeping with mild anasarca. 4. Marked periportal edema, nonspecific, but possibly related to congestive hepatopathy given the above findings. Acute inflammatory conditions of the liver or biliary tree, however, could appear similarly and correlation with liver enzymes is recommended. 5. Cholelithiasis. 6. Innumerable cystic lesions arising from the body and tail the pancreas likely representing multiple dilated pancreatic side branches or, less likely, multifocal intraductal pancreatic mucinous neoplasm. The largest cystic lesion measures 2.6 cm. If clinically indicated, follow-up  MRI examination in 6 months is recommended to document stability. 7. 17 mm hyperdense lesion arising from the lower pole of the left kidney in keeping with a primary renal neoplasm. Urologic consultation may be helpful for further management. 8. Marked prostatic enlargement. Aortic Atherosclerosis (ICD10-I70.0). Electronically Signed   By: Fidela Salisbury M.D.   On: 10/17/2022 02:01     Assessment   Lijah Sarabia is a 89 y.o. year old male who lives alone with past medical history of CAD, carotid artery stenosis, HTN, hyperlipidemia, diabetes, cardiomyopathy, heart failure,  EF 20-25% in Oct 2023, now admitted with concern for acute cholecystitis, acute on chronic HF, findings of acute RLE DVT. GI consulted due to new onset elevated transaminases.   Elevated transaminases: Initially AST 501, ALT 482, Tbili normal, normal lipase, negative hepatitis panel, acetaminophen level normal, with steady improvement in transaminases since admission. Now AST 103, ALT 279. Imaging findings as above with concern for acute cholecystitis. HIDA has been ordered as recommended per Surgery. Suspect he may have had microlithiasis passage, even though bilirubin and bile duct normal, along with underlying congestive hepatopathy. Unable to exclude ischemic hepatitis as numbers falling rapidly as well, but he did not have any documented hypotension and less likely etiology. Would be high risk surgical candidate. Could consider Franklin Gonzalez going forward if necessary. No role for ERCP at this time.   Multiple abnormal findings noted on CT including non-specific liver hypodensities and innumerable pancreatic cystic lesions. Recommend dedicated MRI, which can be done as outpatient. Concern for renal neoplasm as well. May need to consider palliative involvement in light of age and multimorbidity.   RLE DVT: new finding. On heparin infusion.      Plan / Recommendations    Awaiting HIDA scan Trend HFP No role for ERCP as numbers  decreasing; if bilirubin climbs or worsening HFP, can consider MRCP Outpatient dedicated MRI of pancreas Recommend Urology evaluation for concerning renal lesion Consider palliative involvement in light of multimorbidities     10/18/2022, 8:39 AM  Annitta Needs, PhD, ANP-BC [facility]

## 2022-10-19 ENCOUNTER — Encounter (HOSPITAL_COMMUNITY): Payer: Self-pay | Admitting: Family Medicine

## 2022-10-19 ENCOUNTER — Inpatient Hospital Stay (HOSPITAL_COMMUNITY): Payer: Medicare Other

## 2022-10-19 DIAGNOSIS — R112 Nausea with vomiting, unspecified: Secondary | ICD-10-CM | POA: Diagnosis not present

## 2022-10-19 DIAGNOSIS — R7989 Other specified abnormal findings of blood chemistry: Secondary | ICD-10-CM

## 2022-10-19 DIAGNOSIS — K7200 Acute and subacute hepatic failure without coma: Secondary | ICD-10-CM

## 2022-10-19 DIAGNOSIS — I5023 Acute on chronic systolic (congestive) heart failure: Secondary | ICD-10-CM | POA: Diagnosis not present

## 2022-10-19 DIAGNOSIS — I509 Heart failure, unspecified: Secondary | ICD-10-CM

## 2022-10-19 LAB — COMPREHENSIVE METABOLIC PANEL
ALT: 195 U/L — ABNORMAL HIGH (ref 0–44)
AST: 46 U/L — ABNORMAL HIGH (ref 15–41)
Albumin: 3.3 g/dL — ABNORMAL LOW (ref 3.5–5.0)
Alkaline Phosphatase: 90 U/L (ref 38–126)
Anion gap: 9 (ref 5–15)
BUN: 48 mg/dL — ABNORMAL HIGH (ref 8–23)
CO2: 23 mmol/L (ref 22–32)
Calcium: 8.5 mg/dL — ABNORMAL LOW (ref 8.9–10.3)
Chloride: 105 mmol/L (ref 98–111)
Creatinine, Ser: 1.64 mg/dL — ABNORMAL HIGH (ref 0.61–1.24)
GFR, Estimated: 38 mL/min — ABNORMAL LOW (ref >60)
Glucose, Bld: 152 mg/dL — ABNORMAL HIGH (ref 70–99)
Potassium: 4.2 mmol/L (ref 3.5–5.1)
Sodium: 137 mmol/L (ref 135–145)
Total Bilirubin: 0.5 mg/dL (ref 0.3–1.2)
Total Protein: 5.7 g/dL — ABNORMAL LOW (ref 6.5–8.1)

## 2022-10-19 LAB — CBC
HCT: 33.8 % — ABNORMAL LOW (ref 39.0–52.0)
Hemoglobin: 10.9 g/dL — ABNORMAL LOW (ref 13.0–17.0)
MCH: 31.5 pg (ref 26.0–34.0)
MCHC: 32.2 g/dL (ref 30.0–36.0)
MCV: 97.7 fL (ref 80.0–100.0)
Platelets: 112 10*3/uL — ABNORMAL LOW (ref 150–400)
RBC: 3.46 MIL/uL — ABNORMAL LOW (ref 4.22–5.81)
RDW: 14.9 % (ref 11.5–15.5)
WBC: 6.3 10*3/uL (ref 4.0–10.5)
nRBC: 0 % (ref 0.0–0.2)

## 2022-10-19 LAB — HEPARIN LEVEL (UNFRACTIONATED)
Heparin Unfractionated: 0.77 IU/mL — ABNORMAL HIGH (ref 0.30–0.70)
Heparin Unfractionated: 0.77 IU/mL — ABNORMAL HIGH (ref 0.30–0.70)

## 2022-10-19 LAB — GLUCOSE, CAPILLARY
Glucose-Capillary: 130 mg/dL — ABNORMAL HIGH (ref 70–99)
Glucose-Capillary: 172 mg/dL — ABNORMAL HIGH (ref 70–99)
Glucose-Capillary: 238 mg/dL — ABNORMAL HIGH (ref 70–99)
Glucose-Capillary: 209 mg/dL — ABNORMAL HIGH (ref 70–99)

## 2022-10-19 MED ORDER — LATANOPROST 0.005 % OP SOLN
1.0000 [drp] | Freq: Every day | OPHTHALMIC | Status: DC
  Administered 2022-10-19: 1 [drp] via OPHTHALMIC
  Filled 2022-10-19: qty 2.5

## 2022-10-19 MED ORDER — SALINE SPRAY 0.65 % NA SOLN
2.0000 | Freq: Three times a day (TID) | NASAL | Status: DC
  Administered 2022-10-19 – 2022-10-20 (×2): 2 via NASAL
  Filled 2022-10-19: qty 44

## 2022-10-19 MED ORDER — POLYETHYLENE GLYCOL 3350 17 G PO PACK
17.0000 g | PACK | Freq: Every day | ORAL | Status: DC
  Administered 2022-10-19 – 2022-10-20 (×2): 17 g via ORAL
  Filled 2022-10-19 (×2): qty 1

## 2022-10-19 MED ORDER — TECHNETIUM TC 99M MEBROFENIN IV KIT
5.0000 | PACK | Freq: Once | INTRAVENOUS | Status: AC | PRN
  Administered 2022-10-19: 5.1 via INTRAVENOUS

## 2022-10-19 MED ORDER — DORZOLAMIDE HCL-TIMOLOL MAL 2-0.5 % OP SOLN
1.0000 [drp] | Freq: Two times a day (BID) | OPHTHALMIC | Status: DC
  Administered 2022-10-19 – 2022-10-20 (×3): 1 [drp] via OPHTHALMIC
  Filled 2022-10-19: qty 10

## 2022-10-19 MED ORDER — POLYVINYL ALCOHOL 1.4 % OP SOLN: 1.0000 [drp] | OPHTHALMIC | Status: DC | PRN

## 2022-10-19 MED ORDER — SENNOSIDES-DOCUSATE SODIUM 8.6-50 MG PO TABS
2.0000 | ORAL_TABLET | Freq: Every day | ORAL | Status: DC
  Administered 2022-10-19: 2 via ORAL
  Filled 2022-10-19: qty 2

## 2022-10-19 MED ORDER — FUROSEMIDE 10 MG/ML IJ SOLN
INTRAMUSCULAR | Status: AC
  Filled 2022-10-19: qty 20

## 2022-10-19 NOTE — Progress Notes (Signed)
Pt had a good night. Rested well. Good urinary output during night.

## 2022-10-19 NOTE — Progress Notes (Signed)
[facility]  Doing better. Hungry. HIDA negative this AM. No abdominal pain or nausea.  BP 109/70 (BP Location: Right Arm)   Pulse 67   Temp (!) 97.5 F (36.4 C) (Oral)   Resp 18   Ht 5' 11"$  (1.803 m)   Wt 68.7 kg   SpO2 97%   BMI 21.12 kg/m  Soft, nontender, nondistended  Patient with elevated LFTs and some vague nausea/vomiting episode prior to admission. No abdominal pain now, feeling better. He has stones but no cholecystitis.  HIDA negative. Diet as tolerated LFTs coming down, likely form the hepatic congestion from CHF  No surgical intervention needed.   Curlene Labrum, MD [facility] [address] [place], [place] [address] [phone] (office)

## 2022-10-19 NOTE — Progress Notes (Addendum)
ANTICOAGULATION CONSULT NOTE   Pharmacy Consult for heparin Indication: DVT  No Known Allergies  Patient Measurements: Height: 5' 11"$  (180.3 cm) Weight: 68.7 kg (151 lb 7.3 oz) IBW/kg (Calculated) : 75.3 Heparin Dosing Weight: 65  Vital Signs: Temp: 97.7 F (36.5 C) (02/13 0447) Temp Source: Axillary (02/13 0447) BP: 102/63 (02/13 0447) Pulse Rate: 90 (02/13 0447)  Labs: Recent Labs    10/17/22 0035 10/17/22 0553 10/17/22 0738 10/17/22 2313 10/18/22 0444 10/19/22 0442  HGB 11.8* 12.2*  --   --  12.2* 10.9*  HCT 36.5* 38.0*  --   --  38.0* 33.8*  PLT 111* 114*  --   --  114* 112*  LABPROT  --   --  15.9*  --   --   --   INR  --   --  1.3*  --   --   --   HEPARINUNFRC  --   --   --  0.54 0.63 0.77*  CREATININE 1.92* 1.95*  --   --  1.68*  --      Estimated Creatinine Clearance: 23.9 mL/min (A) (by C-G formula based on SCr of 1.68 mg/dL (H)).   Medical History: Past Medical History:  Diagnosis Date   Bifascicular block    Coronary artery disease    CABG 1993 at [facility]; NSTEMI 09/2012 s/p DES to SVG-OM1, DES to SVG-PDA    DM (diabetes mellitus) ([place])    HFrEF (heart failure with reduced ejection fraction) (HCC)    HTN (hypertension)    SVT (supraventricular tachycardia)     Assessment: Pharmacy consulted to dose heparin in patient with right leg DVT.  Patient is not on anticoagulation prior to admission.  Heparin level just above goal at 0.77 this morning on 1100 units/hr. Hemoglobin down slightly from 12.2 to 10.9, no bleeding or IV issues noted.   Goal of Therapy:  Heparin level 0.3-0.7 units/ml Monitor platelets by anticoagulation protocol: Yes   Plan:  Reduce heparin infusion to 950 units/hr. Recheck level this afternoon Check anti-Xa level daily Continue to monitor H&H and platelets.  Addendum:  Heparin level still elevated after rate change ~6 hours ago. Will make another adjustment and recheck in am.   Erin Hearing PharmD.,  BCPS Clinical Pharmacist 10/19/2022 4:26 PM

## 2022-10-19 NOTE — Progress Notes (Signed)
Gastroenterology Progress Note   Referring Provider: No ref. provider found Primary Care Physician:  [place] Nation, MD Primary Gastroenterologist:  Dr. Elon Alas. Abbey Chatters, DO  Patient ID: Franklin Gonzalez; Franklin Gonzalez; Franklin Gonzalez, Franklin Gonzalez    Subjective   Doing well. Denies abdominal pain, nausea, vomiting. + flatus but no BM. Tolerating diet.    Objective   Vital signs in last 24 hours Temp:  [97.7 F (36.5 C)-97.9 F (36.6 C)] 97.7 F (36.5 C) (02/13 0447) Pulse Rate:  [87-90] 90 (02/13 0447) Resp:  [18] 18 (02/13 0447) BP: (102-122)/(63-86) 102/63 (02/13 0447) SpO2:  [100 %] 100 % (02/13 0447) Weight:  [68.7 kg] 68.7 kg (02/13 0447) Last BM Date : 10/17/22  Physical Exam General:   Thin, frail. Pleasant  Head:  Normocephalic and atraumatic. Eyes:  No icterus, sclera clear. Conjuctiva pink.   Heart:  S1, S2 present, no murmurs noted. Lungs: Clear to auscultation bilaterally, without wheezing, rales, or rhonchi. Abdomen:  Bowel sounds present, soft, non-tender, non-distended. No HSM or hernias noted. No rebound or guarding. No masses appreciated   Extremities:  Without clubbing or edema. Neurologic:  Alert and  oriented x4. Psych:  Alert and cooperative. Normal mood and affect.  Intake/Output from previous day: 02/12 0701 - 02/13 0700 In: 1375.5 [P.O.:840; I.V.:318.5; IV Piggyback:217.1] Out: 2150 [Urine:2150] Intake/Output this shift: No intake/output data recorded.  Lab Results  Recent Labs    10/17/22 0553 10/18/22 0444 10/19/22 0442  WBC 7.8 6.7 6.3  HGB 12.2* 12.2* 10.9*  HCT 38.0* 38.0* 33.8*  PLT 114* 114* 112*   BMET Recent Labs    10/17/22 0035 10/17/22 0553 10/18/22 0444  NA 138 137 139  K 5.0 5.0 4.2  CL 109 109 111  CO2 19* 20* 19*  GLUCOSE 289* 261* 89  BUN 64* 66* 58*  CREATININE 1.92* 1.95* 1.68*  CALCIUM 8.1* 8.6* 8.4*   LFT Recent Labs    10/17/22 0035 10/17/22 0553 10/18/22 0444  PROT 5.5* 5.5* 5.2*  ALBUMIN 3.2* 3.3* 3.0*   AST 501* 366* 103*  ALT 483* 471* 279*  ALKPHOS 117 112 93  BILITOT 0.7 0.6 0.7   PT/INR Recent Labs    10/17/22 0738  LABPROT 15.9*  INR 1.3*   Hepatitis Panel Recent Labs    10/17/22 0738  HEPBSAG NON REACTIVE  HCVAB NON REACTIVE  HEPAIGM NON REACTIVE  HEPBIGM NON REACTIVE   Studies/Results ECHOCARDIOGRAM COMPLETE  Result Date: 10/18/2022    ECHOCARDIOGRAM REPORT   Patient Name:   RALPHEAL SURGENER Date of Exam: 10/18/2022 Medical Rec #:  Franklin Gonzalez       Height:       71.0 in Accession #:    [id number]      Weight:       154.3 lb Date of Birth:  Franklin Gonzalez-Gonzalez-Gonzalez       BSA:          1.889 m Patient Age:    89 years        BP:           105/68 mmHg Patient Gender: M               HR:           57 bpm. Exam Location:  [facility] Procedure: 2D Echo, Cardiac Doppler and Color Doppler Indications:    R06.00 Dyspnea  History:        Patient has prior history of Echocardiogram examinations, most  recent 06/17/2022. CHF, CAD; Risk Factors:Hypertension, Diabetes                 and Dyslipidemia.  Sonographer:    Alvino Chapel RCS Referring Phys: [phone] COURAGE EMOKPAE IMPRESSIONS  1. Left ventricular ejection fraction, by estimation, is approximately 20%. The left ventricle has severely decreased function. The left ventricle demonstrates global hypokinesis. The left ventricular internal cavity size was moderately dilated. Left ventricular diastolic parameters are consistent with Grade II diastolic dysfunction (pseudonormalization).  2. Right ventricular systolic function is severely reduced. The right ventricular size is mildly enlarged. There is mildly elevated pulmonary artery systolic pressure. The estimated right ventricular systolic pressure is [id number] mmHg.  3. Left atrial size was severely dilated.  4. Right atrial size was moderately dilated.  5. The mitral valve is grossly normal. Mild mitral valve regurgitation.  6. Tricuspid valve regurgitation is mild to moderate.  7. The aortic  valve is tricuspid. There is mild calcification of the aortic valve. Aortic valve regurgitation is not visualized.  8. The inferior vena cava is dilated in size with >50% respiratory variability, suggesting right atrial pressure of 8 mmHg. Comparison(s): Prior images reviewed side by side. LV and RV function are stable. FINDINGS  Left Ventricle: Left ventricular ejection fraction, by estimation, is 20%. The left ventricle has severely decreased function. The left ventricle demonstrates global hypokinesis. The left ventricular internal cavity size was moderately dilated. There is  no left ventricular hypertrophy. Left ventricular diastolic parameters are consistent with Grade II diastolic dysfunction (pseudonormalization). Right Ventricle: The right ventricular size is mildly enlarged. No increase in right ventricular wall thickness. Right ventricular systolic function is severely reduced. There is mildly elevated pulmonary artery systolic pressure. The tricuspid regurgitant velocity is 2.76 m/s, and with an assumed right atrial pressure of 8 mmHg, the estimated right ventricular systolic pressure is [id number] mmHg. Left Atrium: Left atrial size was severely dilated. Right Atrium: Right atrial size was moderately dilated. Pericardium: There is no evidence of pericardial effusion. Mitral Valve: The mitral valve is grossly normal. There is mild thickening of the mitral valve leaflet(s). Mild mitral valve regurgitation. Tricuspid Valve: The tricuspid valve is grossly normal. Tricuspid valve regurgitation is mild to moderate. Aortic Valve: The aortic valve is tricuspid. There is mild calcification of the aortic valve. There is mild aortic valve annular calcification. Aortic valve regurgitation is not visualized. Pulmonic Valve: The pulmonic valve was grossly normal. Pulmonic valve regurgitation is trivial. Aorta: The aortic root is normal in size and structure. Venous: The inferior vena cava is dilated in size with greater  than 50% respiratory variability, suggesting right atrial pressure of 8 mmHg. IAS/Shunts: No atrial level shunt detected by color flow Doppler.  LEFT VENTRICLE PLAX 2D LVIDd:         6.80 cm      Diastology LVIDs:         6.00 cm      LV e' medial:    3.08 cm/s LV PW:         0.90 cm      LV E/e' medial:  23.5 LV IVS:        0.80 cm      LV e' lateral:   3.00 cm/s LVOT diam:     2.20 cm      LV E/e' lateral: 24.2 LV SV:         32 LV SV Index:   17 LVOT Area:     3.80 cm  LV Volumes (  MOD) LV vol d, MOD A2C: 172.0 ml LV vol d, MOD A4C: 190.0 ml LV vol s, MOD A2C: 125.0 ml LV vol s, MOD A4C: 150.0 ml LV SV MOD A2C:     47.0 ml LV SV MOD A4C:     190.0 ml LV SV MOD BP:      41.7 ml RIGHT VENTRICLE RV S prime:     4.78 cm/s TAPSE (M-mode): 1.4 cm LEFT ATRIUM              Index        RIGHT ATRIUM           Index LA diam:        4.50 cm  2.38 cm/m   RA Area:     26.30 cm LA Vol (A2C):   171.0 ml 90.55 ml/m  RA Volume:   95.30 ml  50.46 ml/m LA Vol (A4C):   104.0 ml 55.07 ml/m LA Biplane Vol: 134.0 ml 70.95 ml/m  AORTIC VALVE LVOT Vmax:   52.90 cm/s LVOT Vmean:  33.400 cm/s LVOT VTI:    0.084 m  AORTA Ao Root diam: 3.70 cm MITRAL VALVE               TRICUSPID VALVE MV Area (PHT): 2.73 cm    TR Peak grad:   30.5 mmHg MV Decel Time: 278 msec    TR Vmax:        276.00 cm/s MR Peak grad: 60.2 mmHg MR Mean grad: 36.0 mmHg    SHUNTS MR Vmax:      388.00 cm/s  Systemic VTI:  0.08 m MR Vmean:     276.0 cm/s   Systemic Diam: 2.20 cm MV E velocity: 72.50 cm/s Rozann Lesches MD Electronically signed by Rozann Lesches MD Signature Date/Time: 10/18/2022/3:19:02 PM    Final    US Venous Img Lower Bilateral (DVT)  Result Date: 10/17/2022 CLINICAL DATA:  Bilateral lower extremity edema. EXAM: BILATERAL LOWER EXTREMITY VENOUS DOPPLER ULTRASOUND TECHNIQUE: Gray-scale sonography with graded compression, as well as color Doppler and duplex ultrasound were performed to evaluate the lower extremity deep venous systems from the  level of the common femoral vein and including the common femoral, femoral, profunda femoral, popliteal and calf veins including the posterior tibial, peroneal and gastrocnemius veins when visible. The superficial great saphenous vein was also interrogated. Spectral Doppler was utilized to evaluate flow at rest and with distal augmentation maneuvers in the common femoral, femoral and popliteal veins. COMPARISON:  Right lower extremity venous Doppler ultrasound 06/16/2022 FINDINGS: RIGHT LOWER EXTREMITY Common Femoral Vein: No evidence of thrombus. Normal compressibility, respiratory phasicity and response to augmentation. Saphenofemoral Junction: No evidence of thrombus. Normal compressibility and flow on color Doppler imaging. Profunda Femoral Vein: No evidence of thrombus. Normal compressibility and flow on color Doppler imaging. Femoral Vein: No evidence of thrombus. Normal compressibility, respiratory phasicity and response to augmentation. Popliteal Vein: Noncompressible with nonocclusive thrombus. Calf Veins: No evidence of thrombus. Normal compressibility and flow on color Doppler imaging. Superficial Great Saphenous Vein: No evidence of thrombus. Normal compressibility. Venous Reflux:  None. Other Findings:  None. LEFT LOWER EXTREMITY Common Femoral Vein: No evidence of thrombus. Normal compressibility, respiratory phasicity and response to augmentation. Saphenofemoral Junction: No evidence of thrombus. Normal compressibility and flow on color Doppler imaging. Profunda Femoral Vein: No evidence of thrombus. Normal compressibility and flow on color Doppler imaging. Femoral Vein: No evidence of thrombus. Normal compressibility, respiratory phasicity and response to augmentation. Popliteal Vein: No  evidence of thrombus. Normal compressibility, respiratory phasicity and response to augmentation. Calf Veins: No evidence of thrombus. Normal compressibility and flow on color Doppler imaging. Superficial Great  Saphenous Vein: No evidence of thrombus. Normal compressibility. Venous Reflux:  None. Other Findings:  None. IMPRESSION: 1. Positive for nonocclusive DVT in the right popliteal vein. 2. No evidence of DVT in the left lower extremity. Electronically Signed   By: Logan Bores M.D.   On: 10/17/2022 11:35   US Abdomen Limited RUQ (LIVER/GB)  Result Date: 10/17/2022 CLINICAL DATA:  Nausea and vomiting EXAM: ULTRASOUND ABDOMEN LIMITED RIGHT UPPER QUADRANT COMPARISON:  CT scan of the abdomen and pelvis performed earlier today FINDINGS: Gallbladder: Echogenic material within the gallbladder lumen consistent with cholelithiasis. There is evidence of posterior acoustic shadowing. The gallbladder wall is diffusely thickened measuring up to 7 mm. Per the sonographer, the sonographic Percell Miller sign was negative. Trace pericholecystic fluid is visualized. Common bile duct: Diameter: Normal at 6 mm. Liver: No focal lesion identified. Within normal limits in parenchymal echogenicity. Portal vein is patent on color Doppler imaging with normal direction of blood flow towards the liver. Other: None. IMPRESSION: Cholelithiasis with diffuse gallbladder wall thickening and pericholecystic fluid. In the appropriate clinical setting, findings are concerning for acute calculus cholecystitis. Electronically Signed   By: Jacqulynn Cadet M.D.   On: 10/17/2022 09:04   DG Chest Port 1 View  Result Date: 10/17/2022 CLINICAL DATA:  Congestive heart failure. EXAM: PORTABLE CHEST 1 VIEW COMPARISON:  06/16/2022 FINDINGS: 0645 hours. The cardio pericardial silhouette is enlarged. Interstitial markings are diffusely coarsened with chronic features. Bibasilar atelectasis with small bilateral pleural effusions evident. Airspace disease at the right base not excluded. Bones are diffusely demineralized. Telemetry leads overlie the chest. IMPRESSION: Bibasilar atelectasis with small bilateral pleural effusions, right greater than left. Right basilar  pneumonia cannot be excluded. Electronically Signed   By: Misty Stanley M.D.   On: 10/17/2022 Gonzalez:57   CT ABDOMEN PELVIS W CONTRAST  Result Date: 10/17/2022 CLINICAL DATA:  Abdominal pain, acute, nonlocalized, vomiting EXAM: CT ABDOMEN AND PELVIS WITH CONTRAST TECHNIQUE: Multidetector CT imaging of the abdomen and pelvis was performed using the standard protocol following bolus administration of intravenous contrast. RADIATION DOSE REDUCTION: This exam was performed according to the departmental dose-optimization program which includes automated exposure control, adjustment of the mA and/or kV according to patient size and/or use of iterative reconstruction technique. CONTRAST:  55m OMNIPAQUE IOHEXOL 300 MG/ML  SOLN COMPARISON:  None Available. FINDINGS: Lower chest: Mild cardiomegaly. Extensive coronary artery calcification. Moderate right and trace left pleural effusions are present with associated bibasilar compressive atelectasis. Small hiatal hernia. Hepatobiliary: There is marked periportal edema which may relate to congestive hepatopathy, but is nonspecific. Scattered hypodensities within the liver are nonspecific, possibly representing small hepatic cysts or hemangioma in a patient without a history of malignancy. No enhancing intrahepatic mass. No intra or extrahepatic biliary ductal dilation. Cholelithiasis noted. Pancreas: Innumerable cystic lesions arise from the body and tail the pancreas likely representing multiple dilated pancreatic side branches or, less likely, multifocal intraductal pancreatic mucinous neoplasm. The largest cystic lesion measures 2.6 cm at axial image # 17/2. No peripancreatic inflammatory changes identified. Spleen: Unremarkable Adrenals/Urinary Tract: The adrenal glands are unremarkable. The kidneys are normal in size and position. There is a 17 mm hyperdense lesion arising from the lower pole of the left kidney measuring 78 Hounsfield units on early arterial phase imaging  and 97 Hounsfield units on delayed phase imaging in keeping with  a primary renal neoplasm. No hydronephrosis. No intrarenal or ureteral calculi. The bladder is unremarkable. Stomach/Bowel: Mild ascites. The stomach, small bowel, and large bowel are otherwise unremarkable. The appendix is normal. No free intraperitoneal gas. Vascular/Lymphatic: No pathologic adenopathy within the abdomen and pelvis. Extensive aortoiliac atherosclerotic calcification. No aortic aneurysm. Reproductive: Marked prostatic enlargement. Other: Small bilateral inguinal hernias are present. There is mild diffuse subcutaneous body wall edema. Musculoskeletal: Osseous structures are diffusely osteopenic. No acute bone abnormality. Mild remote appearing compression deformity of L1. IMPRESSION: 1. No acute intra-abdominal pathology identified. No definite radiographic explanation for the patient's reported symptoms. 2. Mild cardiomegaly. Extensive coronary artery calcification. 3. Moderate right and trace left pleural effusions, mild ascites, and mild diffuse subcutaneous body wall edema in keeping with mild anasarca. 4. Marked periportal edema, nonspecific, but possibly related to congestive hepatopathy given the above findings. Acute inflammatory conditions of the liver or biliary tree, however, could appear similarly and correlation with liver enzymes is recommended. 5. Cholelithiasis. 6. Innumerable cystic lesions arising from the body and tail the pancreas likely representing multiple dilated pancreatic side branches or, less likely, multifocal intraductal pancreatic mucinous neoplasm. The largest cystic lesion measures 2.6 cm. If clinically indicated, follow-up MRI examination in 6 months is recommended to document stability. 7. 17 mm hyperdense lesion arising from the lower pole of the left kidney in keeping with a primary renal neoplasm. Urologic consultation may be helpful for further management. 8. Marked prostatic enlargement. Aortic  Atherosclerosis (ICD10-I70.0). Electronically Signed   By: Fidela Salisbury M.D.   On: 10/17/2022 02:01    Assessment  89 y.o. male with a history of CAD, carotid artery stenosis, HTN, HLD, diabetes, cardiomyopathy, heart failure with EF 20-25% in Oct 2023 who was admitted for concerns for acute cholecystitis, acute on chronic HF, and findings of acute RLE DVT. GI consulted for elevated transaminases.   Elevated transaminases: Significantly elevated on admission with AST 51, ALT 42.  Normal T. bili, lipase.  Serologies negative for acetaminophen level and acute hepatitis.  Transaminases have continued to improve this admission, AST 46, ALT 195; normal alk phos and T bili. CT A/P with periportal edema, nonspecific hypodensities within the liver, cholelithiasis, and abdominal ultrasound with cholelithiasis and diffuse gallbladder wall thickening concerning for cholecystitis.  Surgery also consulted on patient for this, HIDA scan performed this morning with patent cystic duct and filling of the gallbladder which excludes acute cholecystitis and patent common bile duct. Possible passage of microlithiasis as cause for acute elevation in LFTs, less likely ischemia given no hypotension. Other strong possibility is congestive hepatopathy given known heart failure.    Imaging with other abnormal findings including pancreatic cystic lesions and hyperdense lesion in left kidney concerning for neoplasm.  Given these findings as well as nonspecific liver hypodensities dedicated MRI is recommended which can be performed outpatient.  Given age and multiple comorbidities, would recommend considering palliative care involvement.  RLE DVT: Right lower extremity Doppler with finding of nonocclusive DVT.  Currently on heparin infusion.  Plan / Recommendations  Continue to trend HFP No indication for procedure or intervention at this time.  Outpatient MRI of abdomen to evaluate pancreas, liver lesions Continue to recommend  neurology evaluation for renal lesions Strongly consider palliative care involvement Recommend HFP one week post discharge.     LOS: 2 days    10/19/2022, 9:16 AM   Venetia Night, MSN, FNP-BC, AGACNP-BC [facility]

## 2022-10-19 NOTE — Plan of Care (Signed)
  Problem: Acute Rehab PT Goals(only PT should resolve) Goal: Pt Will Go Supine/Side To Sit Outcome: Progressing Flowsheets (Taken 10/19/2022 1427) Pt will go Supine/Side to Sit:  Independently  with modified independence Goal: Patient Will Transfer Sit To/From Stand Outcome: Progressing Flowsheets (Taken 10/19/2022 1427) Patient will transfer sit to/from stand:  with supervision  with modified independence Goal: Pt Will Transfer Bed To Chair/Chair To Bed Outcome: Progressing Flowsheets (Taken 10/19/2022 1427) Pt will Transfer Bed to Chair/Chair to Bed:  with supervision  with modified independence Goal: Pt Will Ambulate Outcome: Progressing Flowsheets (Taken 10/19/2022 1427) Pt will Ambulate:  100 feet  with modified independence  with supervision  with rolling walker   2:28 PM, 10/19/22 Lonell Grandchild, MPT Physical Therapist with [facility] [phone] office [phone] mobile phone

## 2022-10-19 NOTE — Evaluation (Signed)
Physical Therapy Evaluation Patient Details Name: Franklin Gonzalez MRN: [id number] DOB: 12-02-1921 Today's Date: 10/19/2022  History of Present Illness  Richardson Kleinow is a 89 y.o. male with medical history significant for chronic biventricular systolic heart failure with most recent LVEF 20 to 25%, CAD status post CABG in 1993, type 2 diabetes mellitus, send hypertension, paroxysmal SVT, CKD 3B associated baseline creatinine 1.3-1.6, anemia chronic kidney disease associate with hemoglobin range of 10-12, who is admitted to [facility] on 10/17/2022 with acute on chronic biventricular systolic heart failure after presenting from home to [facility] ED complaining of nausea vomiting.      The patient reports persistent nausea over the course the last 1 to 2 days, associated with 2-3 episodes of nonbloody, nonbilious emesis over that timeframe.  Unable to tolerate p.o. over the last day as a consequence of this.  Not associated with any overt abdominal discomfort or any recent diarrhea, melena, or hematochezia.  Denies any significant shortness of breath, but does note some worsening of edema in the bilateral lower extremities.  No associated subjective fever, chills, rigors, generalized measures.  Denies any associated dysuria or gross hematuria.   Clinical Impression  Patient demonstrates good return for sitting up at bedside, labored movement with repeated attempts before able to complete sit to stand from bedside due to BLE weakness, fair/good return for ambulating in room and hallway without loss of balance.  Patient requested to go back to bed due to fatigue after sitting up for 3 hours earlier with nursing staff assisting.  Patient will benefit from continued skilled physical therapy in hospital and recommended venue below to increase strength, balance, endurance for safe ADLs and gait.      Recommendations for follow up therapy are one component of a multi-disciplinary discharge planning process, led  by the attending physician.  Recommendations may be updated based on patient status, additional functional criteria and insurance authorization.  Follow Up Recommendations Home health PT      Assistance Recommended at Discharge Set up Supervision/Assistance  Patient can return home with the following  A little help with walking and/or transfers;A little help with bathing/dressing/bathroom;Help with stairs or ramp for entrance;Assistance with cooking/housework    Equipment Recommendations None recommended by PT  Recommendations for Other Services       Functional Status Assessment Patient has had a recent decline in their functional status and demonstrates the ability to make significant improvements in function in a reasonable and predictable amount of time.     Precautions / Restrictions Precautions Precautions: Fall Restrictions Weight Bearing Restrictions: No      Mobility  Bed Mobility Overal bed mobility: Modified Independent             General bed mobility comments: slightly labored movement    Transfers Overall transfer level: Needs assistance Equipment used: Rolling walker (2 wheels) Transfers: Sit to/from Stand, Bed to chair/wheelchair/BSC Sit to Stand: Min guard   Step pivot transfers: Min guard       General transfer comment: increased time, slightly labored movement    Ambulation/Gait Ambulation/Gait assistance: Min guard Gait Distance (Feet): 80 Feet Assistive device: Rolling walker (2 wheels) Gait Pattern/deviations: Decreased step length - right, Decreased step length - left, Decreased stride length Gait velocity: decreased     General Gait Details: slightly labored cadence without loss of balance, limited mostly due to fatigue  Stairs            Wheelchair Mobility    Modified Rankin (Stroke  Patients Only)       Balance Overall balance assessment: Needs assistance Sitting-balance support: Feet supported, No upper extremity  supported Sitting balance-Leahy Scale: Good Sitting balance - Comments: seated at EOB   Standing balance support: During functional activity, Bilateral upper extremity supported Standing balance-Leahy Scale: Fair Standing balance comment: fair/good using RW                             Pertinent Vitals/Pain Pain Assessment Pain Assessment: No/denies pain    Home Living Family/patient expects to be discharged to:: Private residence Living Arrangements: Alone Available Help at Discharge: Family;Available PRN/intermittently;Personal care attendant Type of Home: House Home Access: Stairs to enter Entrance Stairs-Rails: Can reach both;Right;Left Entrance Stairs-Number of Steps: 4 Alternate Level Stairs-Number of Steps: does not go up stairs Home Layout: Two level;Able to live on main level with bedroom/bathroom Home Equipment: Rolling Walker (2 wheels);Cane - single point;Shower seat;Grab bars - toilet;Grab bars - tub/shower;Toilet riser;BSC/3in1 Additional Comments: Patient states he uses bilateral platform walker that is high (upright walker)    Prior Function Prior Level of Function : Needs assist       Physical Assist : Mobility (physical);ADLs (physical) Mobility (physical): Bed mobility;Transfers;Gait;Stairs ADLs (physical): Bathing Mobility Comments: household ambulation with upright walker ADLs Comments: assisted with bathing, cooking     Hand Dominance        Extremity/Trunk Assessment   Upper Extremity Assessment Upper Extremity Assessment: Overall WFL for tasks assessed    Lower Extremity Assessment Lower Extremity Assessment: Generalized weakness    Cervical / Trunk Assessment Cervical / Trunk Assessment: Kyphotic  Communication   Communication: No difficulties  Cognition Arousal/Alertness: Awake/alert Behavior During Therapy: WFL for tasks assessed/performed Overall Cognitive Status: Within Functional Limits for tasks assessed                                           General Comments      Exercises     Assessment/Plan    PT Assessment Patient needs continued PT services  PT Problem List Decreased strength;Decreased activity tolerance;Decreased balance;Decreased mobility       PT Treatment Interventions DME instruction;Gait training;Stair training;Functional mobility training;Therapeutic activities;Therapeutic exercise;Patient/family education;Balance training    PT Goals (Current goals can be found in the Care Plan section)  Acute Rehab PT Goals Patient Stated Goal: return home with family, home aides to assist PT Goal Formulation: With patient Time For Goal Achievement: 10/22/22 Potential to Achieve Goals: Good    Frequency Min 3X/week     Co-evaluation               AM-PAC PT "6 Clicks" Mobility  Outcome Measure Help needed turning from your back to your side while in a flat bed without using bedrails?: None Help needed moving from lying on your back to sitting on the side of a flat bed without using bedrails?: A Little Help needed moving to and from a bed to a chair (including a wheelchair)?: A Little Help needed standing up from a chair using your arms (e.g., wheelchair or bedside chair)?: A Little Help needed to walk in hospital room?: A Little Help needed climbing 3-5 steps with a railing? : A Little 6 Click Score: 19    End of Session   Activity Tolerance: Patient tolerated treatment well;Patient limited by fatigue Patient left: in bed;with  call bell/phone within reach Nurse Communication: Mobility status PT Visit Diagnosis: Unsteadiness on feet (R26.81);Other abnormalities of gait and mobility (R26.89);Muscle weakness (generalized) (M62.81)    Time: [id number] PT Time Calculation (min) (ACUTE ONLY): 27 min   Charges:   PT Evaluation $PT Eval Moderate Complexity: 1 Mod PT Treatments $Therapeutic Activity: 23-37 mins        2:26 PM, 10/19/22 Lonell Grandchild,  MPT Physical Therapist with [facility] [phone] office [phone] mobile phone

## 2022-10-19 NOTE — Progress Notes (Addendum)
Rounding Note    Patient Name: Franklin Gonzalez Date of Encounter: 10/19/2022  [place] Cardiologist: Kirk Ruths, MD    Subjective   Complains of nasal congestion. No cough or chest pain.  Inpatient Medications    Scheduled Meds:  amiodarone  100 mg Oral Daily   aspirin EC  81 mg Oral Daily   insulin aspart  0-6 Units Subcutaneous TID WC   levothyroxine  88 mcg Oral QAC breakfast   Continuous Infusions:  sodium chloride 10 mL/hr at 10/17/22 2149   cefTRIAXone (ROCEPHIN)  IV 2 g (10/18/22 1817)   furosemide (LASIX) 200 mg in dextrose 5 % 100 mL (2 mg/mL) infusion Stopped (10/19/22 0542)   heparin 1,100 Units/hr (10/18/22 1527)   metronidazole 500 mg (10/19/22 0538)   PRN Meds: sodium chloride, acetaminophen **OR** acetaminophen, fentaNYL (SUBLIMAZE) injection, melatonin, naLOXone (NARCAN)  injection, ondansetron (ZOFRAN) IV   Vital Signs    Vitals:   10/18/22 0555 10/18/22 1356 10/18/22 2029 10/19/22 0447  BP:  104/67 122/86 102/63  Pulse:  87 87 90  Resp:  18 18 18  $ Temp:  97.9 F (36.6 C) 97.8 F (36.6 C) 97.7 F (36.5 C)  TempSrc:  Oral Oral Axillary  SpO2:  100% 100% 100%  Weight: 70 kg   68.7 kg  Height:        Intake/Output Summary (Last 24 hours) at 10/19/2022 0744 Last data filed at 10/19/2022 0447 Gross per 24 hour  Intake 1375.51 ml  Output 2150 ml  Net -774.49 ml      10/19/2022    4:47 AM 10/18/2022    5:55 AM 10/17/2022   12:23 AM  Last 3 Weights  Weight (lbs) 151 lb 7.3 oz 154 lb 5.2 oz 143 lb 4.8 oz  Weight (kg) 68.7 kg 70 kg 65 kg      Telemetry    NSR with PVC's - Personally Reviewed  ECG    No new tracing for review.   Physical Exam    GEN: No acute distress.   Neck: Slight JVD Cardiac: RRR, 2/6 systolic murmur LSB  Respiratory: Decreased breath sounds at bases with scattered wheezes. GI: Soft, nontender, non-distended  MS: slight RLE edema; No deformity. Neuro:  Nonfocal  Psych: Normal affect   Labs     Chemistry Recent Labs  Lab 10/17/22 0035 10/17/22 0553 10/18/22 0444  NA 138 137 139  K 5.0 5.0 4.2  CL 109 109 111  CO2 19* 20* 19*  GLUCOSE 289* 261* 89  BUN 64* 66* 58*  CREATININE 1.92* 1.95* 1.68*  CALCIUM 8.1* 8.6* 8.4*  MG  --  2.6*  --   PROT 5.5* 5.5* 5.2*  ALBUMIN 3.2* 3.3* 3.0*  AST 501* 366* 103*  ALT 483* 471* 279*  ALKPHOS 117 112 93  BILITOT 0.7 0.6 0.7  GFRNONAA 31* 31* 37*  ANIONGAP 10 8 9    $ Hematology Recent Labs  Lab 10/17/22 0553 10/18/22 0444 10/19/22 0442  WBC 7.8 6.7 6.3  RBC 3.90* 3.87* 3.46*  HGB 12.2* 12.2* 10.9*  HCT 38.0* 38.0* 33.8*  MCV 97.4 98.2 97.7  MCH 31.3 31.5 31.5  MCHC 32.1 32.1 32.2  RDW 14.9 15.0 14.9  PLT 114* 114* 112*   Thyroid  Recent Labs  Lab 10/17/22 0738  TSH 0.676    BNP Recent Labs  Lab 10/17/22 0738  BNP 3,338.0*     Radiology    ECHOCARDIOGRAM COMPLETE  Result Date: 10/18/2022    ECHOCARDIOGRAM REPORT  Patient Name:   Franklin Gonzalez Date of Exam: 10/18/2022 Medical Rec #:  [id number]       Height:       71.0 in Accession #:    [id number]      Weight:       154.3 lb Date of Birth:  06/02/1922       BSA:          1.889 m Patient Age:    89 years        BP:           105/68 mmHg Patient Gender: M               HR:           57 bpm. Exam Location:  [facility] Procedure: 2D Echo, Cardiac Doppler and Color Doppler Indications:    R06.00 Dyspnea  History:        Patient has prior history of Echocardiogram examinations, most                 recent 06/17/2022. CHF, CAD; Risk Factors:Hypertension, Diabetes                 and Dyslipidemia.  Sonographer:    Alvino Chapel RCS Referring Phys: [phone] COURAGE EMOKPAE IMPRESSIONS  1. Left ventricular ejection fraction, by estimation, is approximately 20%. The left ventricle has severely decreased function. The left ventricle demonstrates global hypokinesis. The left ventricular internal cavity size was moderately dilated. Left ventricular diastolic parameters are  consistent with Grade II diastolic dysfunction (pseudonormalization).  2. Right ventricular systolic function is severely reduced. The right ventricular size is mildly enlarged. There is mildly elevated pulmonary artery systolic pressure. The estimated right ventricular systolic pressure is [id number] mmHg.  3. Left atrial size was severely dilated.  4. Right atrial size was moderately dilated.  5. The mitral valve is grossly normal. Mild mitral valve regurgitation.  6. Tricuspid valve regurgitation is mild to moderate.  7. The aortic valve is tricuspid. There is mild calcification of the aortic valve. Aortic valve regurgitation is not visualized.  8. The inferior vena cava is dilated in size with >50% respiratory variability, suggesting right atrial pressure of 8 mmHg. Comparison(s): Prior images reviewed side by side. LV and RV function are stable. FINDINGS  Left Ventricle: Left ventricular ejection fraction, by estimation, is 20%. The left ventricle has severely decreased function. The left ventricle demonstrates global hypokinesis. The left ventricular internal cavity size was moderately dilated. There is  no left ventricular hypertrophy. Left ventricular diastolic parameters are consistent with Grade II diastolic dysfunction (pseudonormalization). Right Ventricle: The right ventricular size is mildly enlarged. No increase in right ventricular wall thickness. Right ventricular systolic function is severely reduced. There is mildly elevated pulmonary artery systolic pressure. The tricuspid regurgitant velocity is 2.76 m/s, and with an assumed right atrial pressure of 8 mmHg, the estimated right ventricular systolic pressure is [id number] mmHg. Left Atrium: Left atrial size was severely dilated. Right Atrium: Right atrial size was moderately dilated. Pericardium: There is no evidence of pericardial effusion. Mitral Valve: The mitral valve is grossly normal. There is mild thickening of the mitral valve leaflet(s). Mild mitral  valve regurgitation. Tricuspid Valve: The tricuspid valve is grossly normal. Tricuspid valve regurgitation is mild to moderate. Aortic Valve: The aortic valve is tricuspid. There is mild calcification of the aortic valve. There is mild aortic valve annular calcification. Aortic valve regurgitation is not visualized. Pulmonic Valve: The pulmonic valve was grossly normal. Pulmonic  valve regurgitation is trivial. Aorta: The aortic root is normal in size and structure. Venous: The inferior vena cava is dilated in size with greater than 50% respiratory variability, suggesting right atrial pressure of 8 mmHg. IAS/Shunts: No atrial level shunt detected by color flow Doppler.  LEFT VENTRICLE PLAX 2D LVIDd:         6.80 cm      Diastology LVIDs:         6.00 cm      LV e' medial:    3.08 cm/s LV PW:         0.90 cm      LV E/e' medial:  23.5 LV IVS:        0.80 cm      LV e' lateral:   3.00 cm/s LVOT diam:     2.20 cm      LV E/e' lateral: 24.2 LV SV:         32 LV SV Index:   17 LVOT Area:     3.80 cm  LV Volumes (MOD) LV vol d, MOD A2C: 172.0 ml LV vol d, MOD A4C: 190.0 ml LV vol s, MOD A2C: 125.0 ml LV vol s, MOD A4C: 150.0 ml LV SV MOD A2C:     47.0 ml LV SV MOD A4C:     190.0 ml LV SV MOD BP:      41.7 ml RIGHT VENTRICLE RV S prime:     4.78 cm/s TAPSE (M-mode): 1.4 cm LEFT ATRIUM              Index        RIGHT ATRIUM           Index LA diam:        4.50 cm  2.38 cm/m   RA Area:     26.30 cm LA Vol (A2C):   171.0 ml 90.55 ml/m  RA Volume:   95.30 ml  50.46 ml/m LA Vol (A4C):   104.0 ml 55.07 ml/m LA Biplane Vol: 134.0 ml 70.95 ml/m  AORTIC VALVE LVOT Vmax:   52.90 cm/s LVOT Vmean:  33.400 cm/s LVOT VTI:    0.084 m  AORTA Ao Root diam: 3.70 cm MITRAL VALVE               TRICUSPID VALVE MV Area (PHT): 2.73 cm    TR Peak grad:   30.5 mmHg MV Decel Time: 278 msec    TR Vmax:        276.00 cm/s MR Peak grad: 60.2 mmHg MR Mean grad: 36.0 mmHg    SHUNTS MR Vmax:      388.00 cm/s  Systemic VTI:  0.08 m MR Vmean:      276.0 cm/s   Systemic Diam: 2.20 cm MV E velocity: 72.50 cm/s Rozann Lesches MD Electronically signed by Rozann Lesches MD Signature Date/Time: 10/18/2022/3:19:02 PM    Final    US Venous Img Lower Bilateral (DVT)  Result Date: 10/17/2022 CLINICAL DATA:  Bilateral lower extremity edema. EXAM: BILATERAL LOWER EXTREMITY VENOUS DOPPLER ULTRASOUND TECHNIQUE: Gray-scale sonography with graded compression, as well as color Doppler and duplex ultrasound were performed to evaluate the lower extremity deep venous systems from the level of the common femoral vein and including the common femoral, femoral, profunda femoral, popliteal and calf veins including the posterior tibial, peroneal and gastrocnemius veins when visible. The superficial great saphenous vein was also interrogated. Spectral Doppler was utilized to evaluate flow at rest and with distal augmentation  maneuvers in the common femoral, femoral and popliteal veins. COMPARISON:  Right lower extremity venous Doppler ultrasound 06/16/2022 FINDINGS: RIGHT LOWER EXTREMITY Common Femoral Vein: No evidence of thrombus. Normal compressibility, respiratory phasicity and response to augmentation. Saphenofemoral Junction: No evidence of thrombus. Normal compressibility and flow on color Doppler imaging. Profunda Femoral Vein: No evidence of thrombus. Normal compressibility and flow on color Doppler imaging. Femoral Vein: No evidence of thrombus. Normal compressibility, respiratory phasicity and response to augmentation. Popliteal Vein: Noncompressible with nonocclusive thrombus. Calf Veins: No evidence of thrombus. Normal compressibility and flow on color Doppler imaging. Superficial Great Saphenous Vein: No evidence of thrombus. Normal compressibility. Venous Reflux:  None. Other Findings:  None. LEFT LOWER EXTREMITY Common Femoral Vein: No evidence of thrombus. Normal compressibility, respiratory phasicity and response to augmentation. Saphenofemoral Junction: No  evidence of thrombus. Normal compressibility and flow on color Doppler imaging. Profunda Femoral Vein: No evidence of thrombus. Normal compressibility and flow on color Doppler imaging. Femoral Vein: No evidence of thrombus. Normal compressibility, respiratory phasicity and response to augmentation. Popliteal Vein: No evidence of thrombus. Normal compressibility, respiratory phasicity and response to augmentation. Calf Veins: No evidence of thrombus. Normal compressibility and flow on color Doppler imaging. Superficial Great Saphenous Vein: No evidence of thrombus. Normal compressibility. Venous Reflux:  None. Other Findings:  None. IMPRESSION: 1. Positive for nonocclusive DVT in the right popliteal vein. 2. No evidence of DVT in the left lower extremity. Electronically Signed   By: Logan Bores M.D.   On: 10/17/2022 11:35   US Abdomen Limited RUQ (LIVER/GB)  Result Date: 10/17/2022 CLINICAL DATA:  Nausea and vomiting EXAM: ULTRASOUND ABDOMEN LIMITED RIGHT UPPER QUADRANT COMPARISON:  CT scan of the abdomen and pelvis performed earlier today FINDINGS: Gallbladder: Echogenic material within the gallbladder lumen consistent with cholelithiasis. There is evidence of posterior acoustic shadowing. The gallbladder wall is diffusely thickened measuring up to 7 mm. Per the sonographer, the sonographic Percell Miller sign was negative. Trace pericholecystic fluid is visualized. Common bile duct: Diameter: Normal at 6 mm. Liver: No focal lesion identified. Within normal limits in parenchymal echogenicity. Portal vein is patent on color Doppler imaging with normal direction of blood flow towards the liver. Other: None. IMPRESSION: Cholelithiasis with diffuse gallbladder wall thickening and pericholecystic fluid. In the appropriate clinical setting, findings are concerning for acute calculus cholecystitis. Electronically Signed   By: Jacqulynn Cadet M.D.   On: 10/17/2022 09:04    Assessment & Plan     HFrEF, acute on chronic  biventricular CHF, EF 20-25% on echo 06/2022, yesterdays echo unchanged. BNP 3338, CXR bilateral effusions. On IV lasix drip 69m/hr. I/O's -766. Patient on O2 3L and comfortable but would dry to reduce O2 based on sats. Would continue IV lasix drip at this time.  -losartan 50 mg daily, aldactone 12.5 mg, torsemide 20 mg every other day PTA  all on hold due to soft BP's and AKI. BP still soft and bmet not back.    Acute RLE DVT on IV heparin-slight drop in Hbg   Cholelithiasis with possible Acute cholecystitis on rocephin and flagyl-will be high risk for surgery if needed. HIDA scan not done yest-to be done this am   SVT on amiodarone-PVC's on telemetry       For questions or updates, please contact CGillisPlease consult [email or web address] for contact info under        Signed, MErmalinda Barrios PA-C  10/19/2022, 7:44 AM      Attending note:  Interval chart reviewed  and case discussed with Ms. Vita Barley, I agree with her above findings.  Mr. Theiss is getting HIDA scan performed this morning.  Does have some nasal congestion, no cough or chest pain, no abdominal pain, no shortness of breath at rest.  He continues on Lasix infusion with good urine output and at this time remains off standing outpatient regimen including losartan and Aldactone given present blood pressures.  No follow-up BMET this morning, last creatinine was 1.68.  Continue to follow pending final recommendations from surgical and GI services.  Satira Sark, M.D., F.A.C.C.

## 2022-10-19 NOTE — Progress Notes (Signed)
PROGRESS NOTE     Franklin Gonzalez, is a 89 y.o. male, DOB - Dec 05, 1921, [id number]  Admit date - 10/17/2022   Admitting Physician Leinani Lisbon Denton Brick, MD  Outpatient Primary MD for the patient is Mascot Nation, MD  LOS - 2  Chief Complaint  Patient presents with   Emesis        Brief Narrative:  89 y.o. male with medical history significant for chronic biventricular systolic heart failure with most recent LVEF 20 to 25%, CAD status post CABG in 1993, type 2 diabetes mellitus, send hypertension, paroxysmal SVT, CKD 3B associated baseline creatinine 1.3-1.6, anemia chronic kidney disease associate with hemoglobin range of 10-12, who is admitted to [facility] on 10/17/2022 with acute on chronic biventricular combined diastolic and systolic heart failure and found to have right lower extremity DVT, acute exacerbation of CHF, and acute calculus cholecystitis with significant LFT elevation   -Assessment and Plan: 1) acute calculus cholecystitis----abdominal ultrasound findings noted -Elevated LFTs noted--??  If he passed a stone however alk phos and T. bili not elevated -Continue Rocephin and Flagyl -Antiemetics as needed pain medications as ordered 10/19/22 -Discussed with-GI and general surgery teams,  official consults from GI and general surgery appreciated no interventions planned at this time -HIDA scan on 10/19/2022 unremarkable -In retrospect suspect patient may have had a biliary colic in the setting of cholelithiasis -Consider discontinuing antibiotics over the next 24 to 48 hours if continues to do well -Continue to trend LFTs  2)acute on chronic biventricular combined diastolic and systolic heart failure--- in the setting of acute right lower extremity DVT and acute calculus cholecystitis -Echo from October 2023 with EF of 20 to 25% -Chest x-ray consistent with CHF with bilateral pleural effusions right greater than left BNP 3.338 -10/19/22 -Repeat Echo from  10/18/22 is 20% with grade 2 DD -Discussed with cardiology team, official cardiology consult appreciated --BP too soft to continue IV bolus Lasix - IV Lasix drip and titrate up as BP allows -Continue to hold Aldactone and losartan  3)Acute Rt LE DVT--- c/n IV heparin drip -Chronic anemia and chronic thrombocytopenia noted---  appear stable..hemoglobin dropped a bit ?? hemodilution -Watch closely for bleeding If No bleeding may dc home on eliquis  4)Acute Transaminitis--- suspect secondary to #1 above -Decompensated CHF with hepatic congestion may be contributory --??  If he passed a gall stone however alk phos and T. bili not elevated -Acute viral hepatitis profile is negative -Serum acetaminophen is not elevated HIDA scan is unremarkable     Latest Ref Rng & Units 10/19/2022   10:03 AM 10/18/2022    4:44 AM 10/17/2022    5:53 AM  Hepatic Function  Total Protein 6.5 - 8.1 g/dL 5.7  5.2  5.5   Albumin 3.5 - 5.0 g/dL 3.3  3.0  3.3   AST 15 - 41 U/L 46  103  366   ALT 0 - 44 U/L 195  279  471   Alk Phosphatase 38 - 126 U/L 90  93  112   Total Bilirubin 0.3 - 1.2 mg/dL 0.5  0.7  0.6   10/19/22 -LFTs trending down  5)AKI----acute kidney injury on CKD stage -3B renally adjust medications, avoid nephrotoxic agents / dehydration  / hypotension 10/19/22 -Creatinine peaked at 1.95  -currently trending back down -Voiding well  6)DM2-A1c 8.4 in October 2023 reflecting uncontrolled diabetes with hyperglycemia Use Novolog/Humalog Sliding scale insulin with Accu-Cheks/Fingersticks as ordered   7) chronic anemia/Chronic Thrombocytopenia--- suspect related to  underlying CKD --Hgb and platelets currently close to baseline  8)Hypothyroidism-- -continue levothyroxine -TSH 0.67  9)Social/Ethics--- plan of care discussed with patient's son [name] and patient's daughter-in-law at bedside -Questions answered -Remains a DNR  10) acute hypoxic respiratory failure secondary to CHF  exacerbation--- -continue supplement oxygen at 2L/min -Try to wean off oxygen after  additional diuresis   11)Generalized weakness---Phy therapy rec HH PT  Status is: Inpatient   Disposition: The patient is from: Home              Anticipated d/c is to: Home with [facility]              Anticipated d/c date is: > 3 days              Patient currently is not medically stable to d/c. Barriers: Not Clinically Stable-   Code Status :  -  Code Status: DNR   Family Communication:    plan of care discussed with patient's son [name]  at bedside  DVT Prophylaxis  :   -Iv Heparin  Lab Results  Component Value Date   PLT 112 (L) 10/19/2022   Inpatient Medications  Scheduled Meds:  amiodarone  100 mg Oral Daily   aspirin EC  81 mg Oral Daily   dorzolamide-timolol  1 drop Both Eyes BID   insulin aspart  0-6 Units Subcutaneous TID WC   latanoprost  1 drop Both Eyes QHS   levothyroxine  88 mcg Oral QAC breakfast   polyethylene glycol  17 g Oral Daily   senna-docusate  2 tablet Oral QHS   sodium chloride  2 spray Each Nare TID PC   Continuous Infusions:  sodium chloride 10 mL/hr at 10/17/22 2149   cefTRIAXone (ROCEPHIN)  IV 2 g (10/19/22 1846)   furosemide (LASIX) 200 mg in dextrose 5 % 100 mL (2 mg/mL) infusion 4 mg/hr (10/19/22 1438)   heparin 850 Units/hr (10/19/22 1631)   metronidazole 500 mg (10/19/22 1846)   PRN Meds:.sodium chloride, acetaminophen **OR** acetaminophen, fentaNYL (SUBLIMAZE) injection, melatonin, naLOXone (NARCAN)  injection, ondansetron (ZOFRAN) IV, polyvinyl alcohol   Anti-infectives (From admission, onward)    Start     Dose/Rate Route Frequency Ordered Stop   10/17/22 1800  cefTRIAXone (ROCEPHIN) 2 g in sodium chloride 0.9 % 100 mL IVPB        2 g 200 mL/hr over 30 Minutes Intravenous Every 24 hours 10/17/22 1654     10/17/22 1800  metroNIDAZOLE (FLAGYL) IVPB 500 mg        500 mg 100 mL/hr over 60 Minutes Intravenous Every 12 hours 10/17/22 1654          Subjective: Franklin Gonzalez today has no fevers, no further emesis,  No chest pain,    -Son at bedside --Ate breakfast without nausea or abdominal pain -Dyspnea improving -No BM, no bleeding concerns   Objective: Vitals:   10/19/22 0447 10/19/22 0928 10/19/22 0932 10/19/22 1312  BP: 102/63 109/70  105/63  Pulse: 90 67  95  Resp: 18 18  18  $ Temp: 97.7 F (36.5 C) (!) 97.5 F (36.4 C)  (!) 97.5 F (36.4 C)  TempSrc: Axillary Oral  Oral  SpO2: 100% 100% 97% 100%  Weight: 68.7 kg     Height:        Intake/Output Summary (Last 24 hours) at 10/19/2022 1857 Last data filed at 10/19/2022 1759 Gross per 24 hour  Intake 1375.51 ml  Output 2801 ml  Net -  1425.49 ml   Filed Weights   10/17/22 0023 10/18/22 0555 10/19/22 0447  Weight: 65 kg 70 kg 68.7 kg    Physical Exam  Gen:- Awake Alert,  in no apparent distress  HEENT:- [place].AT, No sclera icterus Nose- [place] 2L/min Neck-Supple Neck,No JVD,.  Lungs-improving air movement, no wheezing  CV- S1, S2 normal, regular , CABG scar Abd-  +ve B.Sounds, Abd Soft, no significant tenderness in right upper quadrant and epigastric area Extremity/Skin:-Improving edema (left leg with scar from prior vein harvest for CABG, right leg is more swollen than left with pitting edema,, positive Homans on the right, pedal pulses present  Psych-affect is appropriate, oriented x3 Neuro-generalized weakness, no new focal deficits, no tremors  Data Reviewed: I have personally reviewed following labs and imaging studies  CBC: Recent Labs  Lab 10/17/22 0035 10/17/22 0553 10/18/22 0444 10/19/22 0442  WBC 8.2 7.8 6.7 6.3  NEUTROABS  --  6.2  --   --   HGB 11.8* 12.2* 12.2* 10.9*  HCT 36.5* 38.0* 38.0* 33.8*  MCV 97.6 97.4 98.2 97.7  PLT 111* 114* 114* [id number]*   Basic Metabolic Panel: Recent Labs  Lab 10/17/22 0035 10/17/22 0553 10/18/22 0444 10/19/22 1003  NA 138 137 139 137  K 5.0 5.0 4.2 4.2  CL 109 109 111 105  CO2 19* 20* 19* 23   GLUCOSE 289* 261* 89 152*  BUN 64* 66* 58* 48*  CREATININE 1.92* 1.95* 1.68* 1.64*  CALCIUM 8.1* 8.6* 8.4* 8.5*  MG  --  2.6*  --   --    GFR: Estimated Creatinine Clearance: 24.4 mL/min (A) (by C-G formula based on SCr of 1.64 mg/dL (H)). Liver Function Tests: Recent Labs  Lab 10/17/22 0035 10/17/22 0553 10/18/22 0444 10/19/22 1003  AST 501* 366* 103* 46*  ALT 483* 471* 279* 195*  ALKPHOS 117 112 93 90  BILITOT 0.7 0.6 0.7 0.5  PROT 5.5* 5.5* 5.2* 5.7*  ALBUMIN 3.2* 3.3* 3.0* 3.3*   Radiology Studies: NM Hepatobiliary Liver Func  Result Date: 10/19/2022 CLINICAL DATA:  Cholecystitis EXAM: NUCLEAR MEDICINE HEPATOBILIARY IMAGING TECHNIQUE: Sequential images of the abdomen were obtained out to 60 minutes following intravenous administration of radiopharmaceutical. RADIOPHARMACEUTICALS:  5.1 mCi Tc-18m Choletec IV COMPARISON:  None Available. FINDINGS: Prompt clearance radiotracer from blood pool and homogeneous uptake in liver. Counts are evident in the small bowel by 20 minutes. Gallbladder begins to fill at 30 minutes and continues to fill over 1 hour interval. IMPRESSION: 1. Patent cystic duct. Filling of the gallbladder excludes acute cholecystitis. 2. Patent common bile duct. Electronically Signed   By: SSuzy BouchardM.D.   On: 10/19/2022 09:53   ECHOCARDIOGRAM COMPLETE  Result Date: 10/18/2022    ECHOCARDIOGRAM REPORT   Patient Name:   Franklin MOUNTAINDate of Exam: 10/18/2022 Medical Rec #:  [id number]      Height:       71.0 in Accession #:    [id number]     Weight:       154.3 lb Date of Birth:  3Jul 05, 1923      BSA:          1.889 m Patient Age:    89years        BP:           105/68 mmHg Patient Gender: M               HR:  57 bpm. Exam Location:  [facility] Procedure: 2D Echo, Cardiac Doppler and Color Doppler Indications:    R06.00 Dyspnea  History:        Patient has prior history of Echocardiogram examinations, most                 recent 06/17/2022. CHF, CAD;  Risk Factors:Hypertension, Diabetes                 and Dyslipidemia.  Sonographer:    Alvino Chapel RCS Referring Phys: [phone] Sonika Levins IMPRESSIONS  1. Left ventricular ejection fraction, by estimation, is approximately 20%. The left ventricle has severely decreased function. The left ventricle demonstrates global hypokinesis. The left ventricular internal cavity size was moderately dilated. Left ventricular diastolic parameters are consistent with Grade II diastolic dysfunction (pseudonormalization).  2. Right ventricular systolic function is severely reduced. The right ventricular size is mildly enlarged. There is mildly elevated pulmonary artery systolic pressure. The estimated right ventricular systolic pressure is [id number] mmHg.  3. Left atrial size was severely dilated.  4. Right atrial size was moderately dilated.  5. The mitral valve is grossly normal. Mild mitral valve regurgitation.  6. Tricuspid valve regurgitation is mild to moderate.  7. The aortic valve is tricuspid. There is mild calcification of the aortic valve. Aortic valve regurgitation is not visualized.  8. The inferior vena cava is dilated in size with >50% respiratory variability, suggesting right atrial pressure of 8 mmHg. Comparison(s): Prior images reviewed side by side. LV and RV function are stable. FINDINGS  Left Ventricle: Left ventricular ejection fraction, by estimation, is 20%. The left ventricle has severely decreased function. The left ventricle demonstrates global hypokinesis. The left ventricular internal cavity size was moderately dilated. There is  no left ventricular hypertrophy. Left ventricular diastolic parameters are consistent with Grade II diastolic dysfunction (pseudonormalization). Right Ventricle: The right ventricular size is mildly enlarged. No increase in right ventricular wall thickness. Right ventricular systolic function is severely reduced. There is mildly elevated pulmonary artery systolic pressure. The  tricuspid regurgitant velocity is 2.76 m/s, and with an assumed right atrial pressure of 8 mmHg, the estimated right ventricular systolic pressure is [id number] mmHg. Left Atrium: Left atrial size was severely dilated. Right Atrium: Right atrial size was moderately dilated. Pericardium: There is no evidence of pericardial effusion. Mitral Valve: The mitral valve is grossly normal. There is mild thickening of the mitral valve leaflet(s). Mild mitral valve regurgitation. Tricuspid Valve: The tricuspid valve is grossly normal. Tricuspid valve regurgitation is mild to moderate. Aortic Valve: The aortic valve is tricuspid. There is mild calcification of the aortic valve. There is mild aortic valve annular calcification. Aortic valve regurgitation is not visualized. Pulmonic Valve: The pulmonic valve was grossly normal. Pulmonic valve regurgitation is trivial. Aorta: The aortic root is normal in size and structure. Venous: The inferior vena cava is dilated in size with greater than 50% respiratory variability, suggesting right atrial pressure of 8 mmHg. IAS/Shunts: No atrial level shunt detected by color flow Doppler.  LEFT VENTRICLE PLAX 2D LVIDd:         6.80 cm      Diastology LVIDs:         6.00 cm      LV e' medial:    3.08 cm/s LV PW:         0.90 cm      LV E/e' medial:  23.5 LV IVS:        0.80 cm  LV e' lateral:   3.00 cm/s LVOT diam:     2.20 cm      LV E/e' lateral: 24.2 LV SV:         32 LV SV Index:   17 LVOT Area:     3.80 cm  LV Volumes (MOD) LV vol d, MOD A2C: 172.0 ml LV vol d, MOD A4C: 190.0 ml LV vol s, MOD A2C: 125.0 ml LV vol s, MOD A4C: 150.0 ml LV SV MOD A2C:     47.0 ml LV SV MOD A4C:     190.0 ml LV SV MOD BP:      41.7 ml RIGHT VENTRICLE RV S prime:     4.78 cm/s TAPSE (M-mode): 1.4 cm LEFT ATRIUM              Index        RIGHT ATRIUM           Index LA diam:        4.50 cm  2.38 cm/m   RA Area:     26.30 cm LA Vol (A2C):   171.0 ml 90.55 ml/m  RA Volume:   95.30 ml  50.46 ml/m LA Vol (A4C):    104.0 ml 55.07 ml/m LA Biplane Vol: 134.0 ml 70.95 ml/m  AORTIC VALVE LVOT Vmax:   52.90 cm/s LVOT Vmean:  33.400 cm/s LVOT VTI:    0.084 m  AORTA Ao Root diam: 3.70 cm MITRAL VALVE               TRICUSPID VALVE MV Area (PHT): 2.73 cm    TR Peak grad:   30.5 mmHg MV Decel Time: 278 msec    TR Vmax:        276.00 cm/s MR Peak grad: 60.2 mmHg MR Mean grad: 36.0 mmHg    SHUNTS MR Vmax:      388.00 cm/s  Systemic VTI:  0.08 m MR Vmean:     276.0 cm/s   Systemic Diam: 2.20 cm MV E velocity: 72.50 cm/s Rozann Lesches MD Electronically signed by Rozann Lesches MD Signature Date/Time: 10/18/2022/3:19:02 PM    Final     Scheduled Meds:  amiodarone  100 mg Oral Daily   aspirin EC  81 mg Oral Daily   dorzolamide-timolol  1 drop Both Eyes BID   insulin aspart  0-6 Units Subcutaneous TID WC   latanoprost  1 drop Both Eyes QHS   levothyroxine  88 mcg Oral QAC breakfast   polyethylene glycol  17 g Oral Daily   senna-docusate  2 tablet Oral QHS   sodium chloride  2 spray Each Nare TID PC   Continuous Infusions:  sodium chloride 10 mL/hr at 10/17/22 2149   cefTRIAXone (ROCEPHIN)  IV 2 g (10/19/22 1846)   furosemide (LASIX) 200 mg in dextrose 5 % 100 mL (2 mg/mL) infusion 4 mg/hr (10/19/22 1438)   heparin 850 Units/hr (10/19/22 1631)   metronidazole 500 mg (10/19/22 1846)    LOS: 2 days   Roxan Hockey M.D on 10/19/2022 at 6:57 PM  Go to [email or web address] - for contact info  [facility] - Office  [phone]  If 7PM-7AM, please contact night-coverage [email or web address] 10/19/2022, 6:57 PM

## 2022-10-20 ENCOUNTER — Telehealth (HOSPITAL_COMMUNITY): Payer: Self-pay

## 2022-10-20 ENCOUNTER — Other Ambulatory Visit (HOSPITAL_COMMUNITY): Payer: Self-pay

## 2022-10-20 DIAGNOSIS — R7989 Other specified abnormal findings of blood chemistry: Secondary | ICD-10-CM | POA: Diagnosis not present

## 2022-10-20 DIAGNOSIS — R112 Nausea with vomiting, unspecified: Secondary | ICD-10-CM | POA: Diagnosis not present

## 2022-10-20 DIAGNOSIS — I5023 Acute on chronic systolic (congestive) heart failure: Secondary | ICD-10-CM | POA: Diagnosis not present

## 2022-10-20 DIAGNOSIS — B179 Acute viral hepatitis, unspecified: Secondary | ICD-10-CM

## 2022-10-20 LAB — COMPREHENSIVE METABOLIC PANEL
ALT: 142 U/L — ABNORMAL HIGH (ref 0–44)
AST: 28 U/L (ref 15–41)
Albumin: 3 g/dL — ABNORMAL LOW (ref 3.5–5.0)
Alkaline Phosphatase: 77 U/L (ref 38–126)
Anion gap: 10 (ref 5–15)
BUN: 45 mg/dL — ABNORMAL HIGH (ref 8–23)
CO2: 22 mmol/L (ref 22–32)
Calcium: 8.6 mg/dL — ABNORMAL LOW (ref 8.9–10.3)
Chloride: 105 mmol/L (ref 98–111)
Creatinine, Ser: 1.54 mg/dL — ABNORMAL HIGH (ref 0.61–1.24)
GFR, Estimated: 41 mL/min — ABNORMAL LOW (ref >60)
Glucose, Bld: 131 mg/dL — ABNORMAL HIGH (ref 70–99)
Potassium: 4 mmol/L (ref 3.5–5.1)
Sodium: 137 mmol/L (ref 135–145)
Total Bilirubin: 0.7 mg/dL (ref 0.3–1.2)
Total Protein: 5.2 g/dL — ABNORMAL LOW (ref 6.5–8.1)

## 2022-10-20 LAB — CBC
HCT: 36.8 % — ABNORMAL LOW (ref 39.0–52.0)
Hemoglobin: 12.1 g/dL — ABNORMAL LOW (ref 13.0–17.0)
MCH: 31.8 pg (ref 26.0–34.0)
MCHC: 32.9 g/dL (ref 30.0–36.0)
MCV: 96.6 fL (ref 80.0–100.0)
Platelets: 124 10*3/uL — ABNORMAL LOW (ref 150–400)
RBC: 3.81 MIL/uL — ABNORMAL LOW (ref 4.22–5.81)
RDW: 14.8 % (ref 11.5–15.5)
WBC: 5.7 10*3/uL (ref 4.0–10.5)
nRBC: 0 % (ref 0.0–0.2)

## 2022-10-20 LAB — GLUCOSE, CAPILLARY: Glucose-Capillary: 125 mg/dL — ABNORMAL HIGH (ref 70–99)

## 2022-10-20 LAB — HEPARIN LEVEL (UNFRACTIONATED): Heparin Unfractionated: 0.24 IU/mL — ABNORMAL LOW (ref 0.30–0.70)

## 2022-10-20 MED ORDER — APIXABAN 5 MG PO TABS: 5.0000 mg | ORAL_TABLET | Freq: Two times a day (BID) | ORAL | Status: DC

## 2022-10-20 MED ORDER — APIXABAN 5 MG PO TABS: ORAL_TABLET | ORAL | 1 refills | Status: DC

## 2022-10-20 MED ORDER — APIXABAN 5 MG PO TABS: 10.0000 mg | ORAL_TABLET | Freq: Two times a day (BID) | ORAL | Status: DC

## 2022-10-20 NOTE — Discharge Instructions (Addendum)
Please follow-up with cardiology next week as scheduled for labs and med adjustments.  They will be calling you with the appointment information.  If you have not heard from them please call your cardiology office to make that appointment.  Please hold off on taking losartan, Lipitor and spironolactone until you follow up with cardiology for repeat labs.     IMPORTANT INFORMATION: PAY CLOSE ATTENTION   PHYSICIAN DISCHARGE INSTRUCTIONS  Follow with Primary care provider  [place] Nation, MD  and other consultants as instructed by your Hospitalist Physician  [place] IF SYMPTOMS COME BACK, WORSEN OR NEW PROBLEM DEVELOPS   Please note: You were cared for by a hospitalist during your hospital stay. Every effort will be made to forward records to your primary care provider.  You can request that your primary care provider send for your hospital records if they have not received them.  Once you are discharged, your primary care physician will handle any further medical issues. Please note that NO REFILLS for any discharge medications will be authorized once you are discharged, as it is imperative that you return to your primary care physician (or establish a relationship with a primary care physician if you do not have one) for your post hospital discharge needs so that they can reassess your need for medications and monitor your lab values.  Please get a complete blood count and chemistry panel checked by your Primary MD at your next visit, and again as instructed by your Primary MD.  Get Medicines reviewed and adjusted: Please take all your medications with you for your next visit with your Primary MD  Laboratory/radiological data: Please request your Primary MD to go over all hospital tests and procedure/radiological results at the follow up, please ask your primary care provider to get all Hospital records sent to his/her office.  In some cases, they will  be blood work, cultures and biopsy results pending at the time of your discharge. Please request that your primary care provider follow up on these results.  If you are diabetic, please bring your blood sugar readings with you to your follow up appointment with primary care.    Please call and make your follow up appointments as soon as possible.    Also Note the following: If you experience worsening of your admission symptoms, develop shortness of breath, life threatening emergency, suicidal or homicidal thoughts you must seek medical attention immediately by calling 911 or calling your MD immediately  if symptoms less severe.  You must read complete instructions/literature along with all the possible adverse reactions/side effects for all the Medicines you take and that have been prescribed to you. Take any new Medicines after you have completely understood and accpet all the possible adverse reactions/side effects.   Do not drive when taking Pain medications or sleeping medications (Benzodiazepines)  Do not take more than prescribed Pain, Sleep and Anxiety Medications. It is not advisable to combine anxiety,sleep and pain medications without talking with your primary care practitioner  Special Instructions: If you have smoked or chewed Tobacco  in the last 2 yrs please stop smoking, stop any regular Alcohol  and or any Recreational drug use.  Wear Seat belts while driving.  Do not drive if taking any narcotic, mind altering or controlled substances or recreational drugs or alcohol.    Information on my medicine - ELIQUIS (apixaban)  This medication education was reviewed with me or my healthcare representative as part  of my discharge preparation.  The pharmacist that spoke with me during my hospital stay was:  Georgina Peer, [facility]  Why was Eliquis prescribed for you? Eliquis was prescribed to treat blood clots that may have been found in the veins of your legs (deep vein  thrombosis) or in your lungs (pulmonary embolism) and to reduce the risk of them occurring again.  What do You need to know about Eliquis ? The starting dose is 10 mg (two 5 mg tablets) taken TWICE daily for the FIRST SEVEN (7) DAYS, then on 10/27/22  the dose is reduced to ONE 5 mg tablet taken TWICE daily.  Eliquis may be taken with or without food.   Try to take the dose about the same time in the morning and in the evening. If you have difficulty swallowing the tablet whole please discuss with your pharmacist how to take the medication safely.  Take Eliquis exactly as prescribed and DO NOT stop taking Eliquis without talking to the doctor who prescribed the medication.  Stopping may increase your risk of developing a new blood clot.  Refill your prescription before you run out.  After discharge, you should have regular check-up appointments with your healthcare provider that is prescribing your Eliquis.    What do you do if you miss a dose? If a dose of ELIQUIS is not taken at the scheduled time, take it as soon as possible on the same day and twice-daily administration should be resumed. The dose should not be doubled to make up for a missed dose.  Important Safety Information A possible side effect of Eliquis is bleeding. You should call your healthcare provider right away if you experience any of the following: Bleeding from an injury or your nose that does not stop. Unusual colored urine (red or dark brown) or unusual colored stools (red or black). Unusual bruising for unknown reasons. A serious fall or if you hit your head (even if there is no bleeding).  Some medicines may interact with Eliquis and might increase your risk of bleeding or clotting while on Eliquis. To help avoid this, consult your healthcare provider or pharmacist prior to using any new prescription or non-prescription medications, including herbals, vitamins, non-steroidal anti-inflammatory drugs (NSAIDs) and  supplements.  This website has more information on Eliquis (apixaban): http://www.eliquis.com/eliquis/home

## 2022-10-20 NOTE — Progress Notes (Signed)
Discharge instructions reviewed with patient and patient's son. Both verbalized understanding of instructions. Patient discharged home with son in stable condition.

## 2022-10-20 NOTE — Progress Notes (Signed)
Progress Note  Patient Name: Franklin Gonzalez Date of Encounter: 10/20/2022  Primary Cardiologist: Kirk Ruths, MD  Subjective   States that he feels good this morning.  No shortness of breath, no chest pain, no abdominal pain.  Has tolerated oral intake.  Inpatient Medications    Scheduled Meds:  amiodarone  100 mg Oral Daily   aspirin EC  81 mg Oral Daily   dorzolamide-timolol  1 drop Both Eyes BID   insulin aspart  0-6 Units Subcutaneous TID WC   latanoprost  1 drop Both Eyes QHS   levothyroxine  88 mcg Oral QAC breakfast   polyethylene glycol  17 g Oral Daily   senna-docusate  2 tablet Oral QHS   sodium chloride  2 spray Each Nare TID PC   Continuous Infusions:  sodium chloride 10 mL/hr at 10/17/22 2149   furosemide (LASIX) 200 mg in dextrose 5 % 100 mL (2 mg/mL) infusion 4 mg/hr (10/19/22 2329)   heparin 1,000 Units/hr (10/20/22 0813)   PRN Meds: sodium chloride, acetaminophen **OR** acetaminophen, fentaNYL (SUBLIMAZE) injection, melatonin, naLOXone (NARCAN)  injection, ondansetron (ZOFRAN) IV, polyvinyl alcohol   Vital Signs    Vitals:   10/19/22 0932 10/19/22 1312 10/19/22 1944 10/20/22 0506  BP:  105/63 106/74 106/71  Pulse:  95 84 60  Resp:  18 18 18  $ Temp:  (!) 97.5 F (36.4 C) (!) 97.5 F (36.4 C) (!) 97.5 F (36.4 C)  TempSrc:  Oral Oral Oral  SpO2: 97% 100% 97% 98%  Weight:    67.3 kg  Height:        Intake/Output Summary (Last 24 hours) at 10/20/2022 0922 Last data filed at 10/20/2022 0506 Gross per 24 hour  Intake 840 ml  Output 3651 ml  Net -2811 ml   Filed Weights   10/18/22 0555 10/19/22 0447 10/20/22 0506  Weight: 70 kg 68.7 kg 67.3 kg    Telemetry    Sinus rhythm.  Personally reviewed.  ECG    No ECG reviewed.  Physical Exam   GEN: No acute distress.   Neck: No JVD. Cardiac: RRR, no gallop.  Respiratory: Nonlabored. Clear to auscultation anteriorly. GI: Soft, nontender, bowel sounds present. MS: No edema; No  deformity. Neuro:  Nonfocal. Psych: Alert and oriented x 3. Normal affect.  Labs    Chemistry Recent Labs  Lab 10/18/22 0444 10/19/22 1003 10/20/22 0446  NA 139 137 137  K 4.2 4.2 4.0  CL 111 105 105  CO2 19* 23 22  GLUCOSE 89 152* 131*  BUN 58* 48* 45*  CREATININE 1.68* 1.64* 1.54*  CALCIUM 8.4* 8.5* 8.6*  PROT 5.2* 5.7* 5.2*  ALBUMIN 3.0* 3.3* 3.0*  AST 103* 46* 28  ALT 279* 195* 142*  ALKPHOS 93 90 77  BILITOT 0.7 0.5 0.7  GFRNONAA 37* 38* 41*  ANIONGAP 9 9 10     $ Hematology Recent Labs  Lab 10/18/22 0444 10/19/22 0442 10/20/22 0446  WBC 6.7 6.3 5.7  RBC 3.87* 3.46* 3.81*  HGB 12.2* 10.9* 12.1*  HCT 38.0* 33.8* 36.8*  MCV 98.2 97.7 96.6  MCH 31.5 31.5 31.8  MCHC 32.1 32.2 32.9  RDW 15.0 14.9 14.8  PLT 114* 112* 124*    BNP Recent Labs  Lab 10/17/22 0738  BNP 3,338.0*     Radiology    NM Hepatobiliary Liver Func  Result Date: 10/19/2022 CLINICAL DATA:  Cholecystitis EXAM: NUCLEAR MEDICINE HEPATOBILIARY IMAGING TECHNIQUE: Sequential images of the abdomen were obtained out to 60 minutes  following intravenous administration of radiopharmaceutical. RADIOPHARMACEUTICALS:  5.1 mCi Tc-11m Choletec IV COMPARISON:  None Available. FINDINGS: Prompt clearance radiotracer from blood pool and homogeneous uptake in liver. Counts are evident in the small bowel by 20 minutes. Gallbladder begins to fill at 30 minutes and continues to fill over 1 hour interval. IMPRESSION: 1. Patent cystic duct. Filling of the gallbladder excludes acute cholecystitis. 2. Patent common bile duct. Electronically Signed   By: SSuzy BouchardM.D.   On: 10/19/2022 09:53   ECHOCARDIOGRAM COMPLETE  Result Date: 10/18/2022    ECHOCARDIOGRAM REPORT   Patient Name:   Franklin CRONANDate of Exam: 10/18/2022 Medical Rec #:  [id number]      Height:       71.0 in Accession #:    [id number]     Weight:       154.3 lb Date of Birth:  316-Sep-1923      BSA:          1.889 m Patient Age:    89years         BP:           105/68 mmHg Patient Gender: M               HR:           57 bpm. [facility]  [facility]: 2D Echo, Cardiac Doppler and Color Doppler Indications:    R06.00 Dyspnea  History:        Patient has prior history of Echocardiogram examinations, most                 recent 06/17/2022. CHF, CAD; Risk Factors:Hypertension, Diabetes                 and Dyslipidemia.  Sonographer:    BAlvino ChapelRCS Referring Phys: [phone] EMOKPAE IMPRESSIONS  1. Left ventricular ejection fraction, by estimation, is approximately 20%. The left ventricle has severely decreased function. The left ventricle demonstrates global hypokinesis. The left ventricular internal cavity size was moderately dilated. Left ventricular diastolic parameters are consistent with Grade II diastolic dysfunction (pseudonormalization).  2. Right ventricular systolic function is severely reduced. The right ventricular size is mildly enlarged. There is mildly elevated pulmonary artery systolic pressure. The estimated right ventricular systolic pressure is [id number].  3. Left atrial size was severely dilated.  4. Right atrial size was moderately dilated.  5. The mitral valve is grossly normal. Mild mitral valve regurgitation.  6. Tricuspid valve regurgitation is mild to moderate.  7. The aortic valve is tricuspid. There is mild calcification of the aortic valve. Aortic valve regurgitation is not visualized.  8. The inferior vena cava is dilated in size with >50% respiratory variability, suggesting right atrial pressure of 8 mmHg. Comparison(s): Prior images reviewed side by side. LV and RV function are stable. FINDINGS  Left Ventricle: Left ventricular ejection fraction, by estimation, is 20%. The left ventricle has severely decreased function. The left ventricle demonstrates global hypokinesis. The left ventricular internal cavity size was moderately dilated. There is  no left ventricular hypertrophy. Left ventricular diastolic  parameters are consistent with Grade II diastolic dysfunction (pseudonormalization). Right Ventricle: The right ventricular size is mildly enlarged. No increase in right ventricular wall thickness. Right ventricular systolic function is severely reduced. There is mildly elevated pulmonary artery systolic pressure. The tricuspid regurgitant velocity is 2.76 m/s, and with an assumed right atrial pressure of 8 mmHg, the estimated right ventricular systolic pressure  is 38.5 mmHg. Left Atrium: Left atrial size was severely dilated. Right Atrium: Right atrial size was moderately dilated. Pericardium: There is no evidence of pericardial effusion. Mitral Valve: The mitral valve is grossly normal. There is mild thickening of the mitral valve leaflet(s). Mild mitral valve regurgitation. Tricuspid Valve: The tricuspid valve is grossly normal. Tricuspid valve regurgitation is mild to moderate. Aortic Valve: The aortic valve is tricuspid. There is mild calcification of the aortic valve. There is mild aortic valve annular calcification. Aortic valve regurgitation is not visualized. Pulmonic Valve: The pulmonic valve was grossly normal. Pulmonic valve regurgitation is trivial. Aorta: The aortic root is normal in size and structure. Venous: The inferior vena cava is dilated in size with greater than 50% respiratory variability, suggesting right atrial pressure of 8 mmHg. IAS/Shunts: No atrial level shunt detected by color flow Doppler.  LEFT VENTRICLE PLAX 2D LVIDd:         6.80 cm      Diastology LVIDs:         6.00 cm      LV e' medial:    3.08 cm/s LV PW:         0.90 cm      LV E/e' medial:  23.5 LV IVS:        0.80 cm      LV e' lateral:   3.00 cm/s LVOT diam:     2.20 cm      LV E/e' lateral: 24.2 LV SV:         32 LV SV Index:   17 LVOT Area:     3.80 cm  LV Volumes (MOD) LV vol d, MOD A2C: 172.0 ml LV vol d, MOD A4C: 190.0 ml LV vol s, MOD A2C: 125.0 ml LV vol s, MOD A4C: 150.0 ml LV SV MOD A2C:     47.0 ml LV SV MOD A4C:      190.0 ml LV SV MOD BP:      41.7 ml RIGHT VENTRICLE RV S prime:     4.78 cm/s TAPSE (M-mode): 1.4 cm LEFT ATRIUM              Index        RIGHT ATRIUM           Index LA diam:        4.50 cm  2.38 cm/m   RA Area:     26.30 cm LA Vol (A2C):   171.0 ml 90.55 ml/m  RA Volume:   95.30 ml  50.46 ml/m LA Vol (A4C):   104.0 ml 55.07 ml/m LA Biplane Vol: 134.0 ml 70.95 ml/m  AORTIC VALVE LVOT Vmax:   52.90 cm/s LVOT Vmean:  33.400 cm/s LVOT VTI:    0.084 m  AORTA Ao Root diam: 3.70 cm MITRAL VALVE               TRICUSPID VALVE MV Area (PHT): 2.73 cm    TR Peak grad:   30.5 mmHg MV Decel Time: 278 msec    TR Vmax:        276.00 cm/s MR Peak grad: 60.2 mmHg MR Mean grad: 36.0 mmHg    SHUNTS MR Vmax:      388.00 cm/s  Systemic VTI:  0.08 m MR Vmean:     276.0 cm/s   Systemic Diam: 2.20 cm MV E velocity: 72.50 cm/s Rozann Lesches MD Electronically signed by Rozann Lesches MD Signature Date/Time: 10/18/2022/3:19:02 PM    Final  Assessment & Plan    1.  HFrEF with acute on chronic presentation associated with weight gain at home, diuresed well on Lasix infusion and clinically stable at this time.  Follow-up echocardiogram shows LVEF approximately 20% with severe RV dysfunction as noted previously.  He is hemodynamically stable, although losartan and Aldactone held in the setting of low normal blood pressure and renal insufficiency.  2.  Cholelithiasis, acute cholecystitis ruled out, HIDA scan reviewed by GI and surgical team.  He was on empiric antibiotics.  No abdominal pain and taking p.o.'s.  He did have transaminitis which is improving, question passed stone or some contribution from heart failure.  3.  History of SVT, quiescent on amiodarone.  4.  Right leg DVT, on IV heparin with plan to switch to Eliquis for outpatient treatment.  Case discussed with hospitalist and surgical team.  I suspect he can go home at this point and have close outpatient follow-up.  We will set up a visit for him to be  seen by cardiology in the next 7 to 10 days.  He is transitioning from IV heparin to Eliquis per primary team.  From a cardiac perspective would continue current doses of aspirin, amiodarone, and present use of Demadex every other day with as needed dosing for weight gain.  Hold off on resumption of losartan, Lipitor and Aldactone until he has follow-up CMET at office visit.  Need to determine final trajectory of renal function, LFT's and also follow-up on blood pressure before resuming.  Signed, Rozann Lesches, MD  10/20/2022, 9:22 AM

## 2022-10-20 NOTE — Progress Notes (Addendum)
[place] for heparin>>apixaban Indication: DVT  No Known Allergies  Patient Measurements: Height: 5' 11"$  (180.3 cm) Weight: 67.3 kg (148 lb 5.9 oz) IBW/kg (Calculated) : 75.3 Heparin Dosing Weight: 65  Vital Signs: Temp: 97.5 F (36.4 C) (02/14 0506) Temp Source: Oral (02/14 0506) BP: 106/71 (02/14 0506) Pulse Rate: 60 (02/14 0506)  Labs: Recent Labs    10/18/22 0444 10/19/22 0442 10/19/22 1003 10/19/22 1512 10/20/22 0446  HGB 12.2* 10.9*  --   --  12.1*  HCT 38.0* 33.8*  --   --  36.8*  PLT 114* 112*  --   --  124*  HEPARINUNFRC 0.63 0.77*  --  0.77* 0.24*  CREATININE 1.68*  --  1.64*  --  1.54*     Estimated Creatinine Clearance: 25.5 mL/min (A) (by C-G formula based on SCr of 1.54 mg/dL (H)).   Medical History: Past Medical History:  Diagnosis Date   Bifascicular block    Coronary artery disease    CABG [facility] at [facility]; NSTEMI 09/2012 s/p DES to SVG-OM1, DES to SVG-PDA    DM (diabetes mellitus) ([place])    HFrEF (heart failure with reduced ejection fraction) ([facility])    HTN (hypertension)    SVT (supraventricular tachycardia)     Assessment: Pharmacy consulted to dose heparin in patient with right leg DVT.  Patient is not on anticoagulation prior to admission.  Heparin level now below goal at 0.24 this morning after multiple rate adjustments yesterday. Hemoglobin back up to 12, no bleeding or IV issues noted.   New orders to change to apixaban this morning. No dose adjustments indicated with treatment dose.   Goal of Therapy:  Heparin level 0.3-0.7 units/ml Monitor platelets by anticoagulation protocol: Yes   Plan:  Apixaban 61m bid x 7 days then 562mbid Will provide education and 30 day free coupon card  FrErin HearingharmD., BCPS Clinical Pharmacist 10/20/2022 7:43 AM

## 2022-10-20 NOTE — Telephone Encounter (Signed)
Patient Teacher, English as a foreign language completed.    The current 30 day co-pay is, $90.00. (50 being applied to deductible)  The patient is insured through [employer]

## 2022-10-20 NOTE — Progress Notes (Signed)
[facility] Progress Note     Subjective: Eating and no pain. Feeling better. Getting lasix and getting fluid off.   Objective: Vital signs in last 24 hours: Temp:  [97.5 F (36.4 C)] 97.5 F (36.4 C) (02/14 0506) Pulse Rate:  [60-95] 60 (02/14 0506) Resp:  [18] 18 (02/14 0506) BP: (105-109)/(63-74) 106/71 (02/14 0506) SpO2:  [97 %-100 %] 98 % (02/14 0506) Weight:  [67.3 kg] 67.3 kg (02/14 0506) Last BM Date : 10/19/22  Intake/Output from previous day: 02/13 0701 - 02/14 0700 In: 840 [P.O.:840] Out: 3651 [Urine:3650; Stool:1] Intake/Output this shift: No intake/output data recorded.  General appearance: alert and no distress GI: soft, nondistended, nontender   Lab Results:  Recent Labs    10/19/22 0442 10/20/22 0446  WBC 6.3 5.7  HGB 10.9* 12.1*  HCT 33.8* 36.8*  PLT 112* 124*   BMET Recent Labs    10/19/22 1003 10/20/22 0446  NA 137 137  K 4.2 4.0  CL 105 105  CO2 23 22  GLUCOSE 152* 131*  BUN 48* 45*  CREATININE 1.64* 1.54*  CALCIUM 8.5* 8.6*   PT/INR No results for input(s): "LABPROT", "INR" in the last 72 hours.  Studies/Results: NM Hepatobiliary Liver Func  Result Date: 10/19/2022 CLINICAL DATA:  Cholecystitis EXAM: NUCLEAR MEDICINE HEPATOBILIARY IMAGING TECHNIQUE: Sequential images of the abdomen were obtained out to 60 minutes following intravenous administration of radiopharmaceutical. RADIOPHARMACEUTICALS:  5.1 mCi Tc-40m Choletec IV COMPARISON:  None Available. FINDINGS: Prompt clearance radiotracer from blood pool and homogeneous uptake in liver. Counts are evident in the small bowel by 20 minutes. Gallbladder begins to fill at 30 minutes and continues to fill over 1 hour interval. IMPRESSION: 1. Patent cystic duct. Filling of the gallbladder excludes acute cholecystitis. 2. Patent common bile duct. Electronically Signed   By: SSuzy BouchardM.D.   On: 10/19/2022 09:53   ECHOCARDIOGRAM COMPLETE  Result Date: 10/18/2022     ECHOCARDIOGRAM REPORT   Patient Name:   Franklin WOODWORTHDate of Exam: 10/18/2022 Medical Rec #:  [id number]      Height:       71.0 in Accession #:    [id number]     Weight:       154.3 lb Date of Birth:  303/20/1923      BSA:          1.889 m Patient Age:    89years        BP:           105/68 mmHg Patient Gender: M               HR:           57 bpm. Exam Location:  [facility]Procedure: 2D Echo, Cardiac Doppler and Color Doppler Indications:    R06.00 Dyspnea  History:        Patient has prior history of Echocardiogram examinations, most                 recent 06/17/2022. CHF, CAD; Risk Factors:Hypertension, Diabetes                 and Dyslipidemia.  Sonographer:    BAlvino ChapelRCS Referring Phys: [phone] EMOKPAE IMPRESSIONS  1. Left ventricular ejection fraction, by estimation, is approximately 20%. The left ventricle has severely decreased function. The left ventricle demonstrates global hypokinesis. The left ventricular internal cavity size was moderately dilated. Left ventricular diastolic parameters are consistent with Grade  II diastolic dysfunction (pseudonormalization).  2. Right ventricular systolic function is severely reduced. The right ventricular size is mildly enlarged. There is mildly elevated pulmonary artery systolic pressure. The estimated right ventricular systolic pressure is [id number] mmHg.  3. Left atrial size was severely dilated.  4. Right atrial size was moderately dilated.  5. The mitral valve is grossly normal. Mild mitral valve regurgitation.  6. Tricuspid valve regurgitation is mild to moderate.  7. The aortic valve is tricuspid. There is mild calcification of the aortic valve. Aortic valve regurgitation is not visualized.  8. The inferior vena cava is dilated in size with >50% respiratory variability, suggesting right atrial pressure of 8 mmHg. Comparison(s): Prior images reviewed side by side. LV and RV function are stable. FINDINGS  Left Ventricle: Left ventricular ejection  fraction, by estimation, is 20%. The left ventricle has severely decreased function. The left ventricle demonstrates global hypokinesis. The left ventricular internal cavity size was moderately dilated. There is  no left ventricular hypertrophy. Left ventricular diastolic parameters are consistent with Grade II diastolic dysfunction (pseudonormalization). Right Ventricle: The right ventricular size is mildly enlarged. No increase in right ventricular wall thickness. Right ventricular systolic function is severely reduced. There is mildly elevated pulmonary artery systolic pressure. The tricuspid regurgitant velocity is 2.76 m/s, and with an assumed right atrial pressure of 8 mmHg, the estimated right ventricular systolic pressure is [id number] mmHg. Left Atrium: Left atrial size was severely dilated. Right Atrium: Right atrial size was moderately dilated. Pericardium: There is no evidence of pericardial effusion. Mitral Valve: The mitral valve is grossly normal. There is mild thickening of the mitral valve leaflet(s). Mild mitral valve regurgitation. Tricuspid Valve: The tricuspid valve is grossly normal. Tricuspid valve regurgitation is mild to moderate. Aortic Valve: The aortic valve is tricuspid. There is mild calcification of the aortic valve. There is mild aortic valve annular calcification. Aortic valve regurgitation is not visualized. Pulmonic Valve: The pulmonic valve was grossly normal. Pulmonic valve regurgitation is trivial. Aorta: The aortic root is normal in size and structure. Venous: The inferior vena cava is dilated in size with greater than 50% respiratory variability, suggesting right atrial pressure of 8 mmHg. IAS/Shunts: No atrial level shunt detected by color flow Doppler.  LEFT VENTRICLE PLAX 2D LVIDd:         6.80 cm      Diastology LVIDs:         6.00 cm      LV e' medial:    3.08 cm/s LV PW:         0.90 cm      LV E/e' medial:  23.5 LV IVS:        0.80 cm      LV e' lateral:   3.00 cm/s LVOT  diam:     2.20 cm      LV E/e' lateral: 24.2 LV SV:         32 LV SV Index:   17 LVOT Area:     3.80 cm  LV Volumes (MOD) LV vol d, MOD A2C: 172.0 ml LV vol d, MOD A4C: 190.0 ml LV vol s, MOD A2C: 125.0 ml LV vol s, MOD A4C: 150.0 ml LV SV MOD A2C:     47.0 ml LV SV MOD A4C:     190.0 ml LV SV MOD BP:      41.7 ml RIGHT VENTRICLE RV S prime:     4.78 cm/s TAPSE (M-mode): 1.4 cm LEFT ATRIUM  Index        RIGHT ATRIUM           Index LA diam:        4.50 cm  2.38 cm/m   RA Area:     26.30 cm LA Vol (A2C):   171.0 ml 90.55 ml/m  RA Volume:   95.30 ml  50.46 ml/m LA Vol (A4C):   104.0 ml 55.07 ml/m LA Biplane Vol: 134.0 ml 70.95 ml/m  AORTIC VALVE LVOT Vmax:   52.90 cm/s LVOT Vmean:  33.400 cm/s LVOT VTI:    0.084 m  AORTA Ao Root diam: 3.70 cm MITRAL VALVE               TRICUSPID VALVE MV Area (PHT): 2.73 cm    TR Peak grad:   30.5 mmHg MV Decel Time: 278 msec    TR Vmax:        276.00 cm/s MR Peak grad: 60.2 mmHg MR Mean grad: 36.0 mmHg    SHUNTS MR Vmax:      388.00 cm/s  Systemic VTI:  0.08 m MR Vmean:     276.0 cm/s   Systemic Diam: 2.20 cm MV E velocity: 72.50 cm/s Rozann Lesches MD Electronically signed by Rozann Lesches MD Signature Date/Time: 10/18/2022/3:19:02 PM    Final     Anti-infectives: Anti-infectives (From admission, onward)    Start     Dose/Rate Route Frequency Ordered Stop   10/17/22 1800  cefTRIAXone (ROCEPHIN) 2 g in sodium chloride 0.9 % 100 mL IVPB        2 g 200 mL/hr over 30 Minutes Intravenous Every 24 hours 10/17/22 1654     10/17/22 1800  metroNIDAZOLE (FLAGYL) IVPB 500 mg        500 mg 100 mL/hr over 60 Minutes Intravenous Every 12 hours 10/17/22 1654         Assessment/Plan: Patient with gallstones, no cholecystitis. Not symptomatic. Eating. LFTs from hepatic congestion. No antibiotics needed, dc'ed Diet as tolerated Follow up with PCP   LOS: 3 days    Virl Cagey 10/20/2022

## 2022-10-20 NOTE — TOC Transition Note (Signed)
Transition of Care [facility]) - CM/SW Discharge Note   Patient Details  Name: Franklin Gonzalez MRN: [id number] Date of Birth: [birth year]  Transition of Care [facility]) CM/SW Contact:  Ihor Gully, LCSW Phone Number: 10/20/2022, 1:47 PM   Clinical Narrative:    PT recommends HHPT. Patient is agreeable. [facility] is agreeable and accepting of patient's insurance. TOC signing off.    Final next level of care: [place] Barriers to Discharge: No Barriers Identified   Patient Goals and CMS Choice      Discharge Placement                         Discharge Plan and Services Additional resources added to the After Visit Summary for   In-house Referral: Clinical Social Work                        HH Arranged: PT [facility] Agency: [facility] ([place]) Date [place]: 10/20/22 Time [place]: 1347 Representative spoke with at Why: [place] Determinants of Health ([place]) Interventions SDOH Screenings   Food Insecurity: No Food Insecurity (10/17/2022)  Housing: Low Risk  (10/17/2022)  Transportation Needs: No Transportation Needs (10/17/2022)  Utilities: Not At Risk (10/17/2022)  Depression (PHQ2-9): Low Risk  (07/25/2020)  Financial Resource Strain: Low Risk  (08/17/2022)  Social Connections: Moderately Integrated (07/25/2020)  Stress: No Stress Concern Present (07/25/2020)  Tobacco Use: Low Risk  (10/19/2022)     Readmission Risk Interventions    10/18/2022   11:39 AM 06/18/2022    4:40 PM  Readmission Risk Prevention Plan  Transportation Screening Complete Complete  PCP or Specialist Appt within 3-5 Days  Not Complete  HRI or [place] Complete Complete  Social Work Consult for [place] Planning/Counseling Complete Complete  Palliative Care Screening Not Applicable Complete  Medication Review Press photographer) Complete Complete

## 2022-10-20 NOTE — Discharge Summary (Addendum)
Physician Discharge Summary  Franklin Gonzalez [id number] DOB: September 14, 1921 DOA: 10/17/2022  PCP: [place] Nation, MD Cardiologist: Dr. Gwenlyn Found  Admit date: 10/17/2022 Discharge date: 10/20/2022  Admitted From:  Home with aides Disposition: Home with aides   Recommendations for Outpatient Follow-up:  Follow up with PCP in 2 weeks Follow up with cardiology in 1 week for repeat labs and med adjustments Hold losartan, atorvastatin and spironolactone until follow up with cardiology Please obtain CMP/CBC in 7-10 days Please arrange outpatient palliative medicine consult for goals of care discussions  Home Health: resume personal care aides  Discharge Condition: STABLE   CODE STATUS: DNR DIET: 2 gm sodium, carb modified    Brief Hospitalization Summary: Please see all hospital notes, images, labs for full details of the hospitalization. ADMISSION HPI: Franklin Gonzalez is a 89 y.o. male with medical history significant for chronic biventricular systolic heart failure with most recent LVEF 20 to 25%, CAD status post CABG in 1993, type 2 diabetes mellitus, send hypertension, paroxysmal SVT, CKD 3B associated baseline creatinine 1.3-1.6, anemia chronic kidney disease associate with hemoglobin range of 10-12, who is admitted to [facility] on 10/17/2022 with acute on chronic biventricular systolic heart failure after presenting from home to [facility] ED complaining of nausea vomiting.    The patient reports persistent nausea over the course the last 1 to 2 days, associated with 2-3 episodes of nonbloody, nonbilious emesis over that timeframe.  Unable to tolerate p.o. over the last day as a consequence of this.  Not associated with any overt abdominal discomfort or any recent diarrhea, melena, or hematochezia.  Denies any significant shortness of breath, but does note some worsening of edema in the bilateral lower extremities.  No associated subjective fever, chills, rigors, generalized measures.  Denies  any associated dysuria or gross hematuria.   Medical history notable for chronic biventricular systolic heart failure, most recent echocardiogram in October 2023 demonstrating LVEF 2025%, mildly dilated left ventricular internal cavity size, severely reduced right ventricular systolic function, severely dilated bilateral atria, moderate to severe tricuspid regurgitation.  Outpatient diuretic regimen includes torsemide 20 mg p.o. every 48 hours as well as spironolactone.  He is also on losartan as an outpatient.   His cardiac history is also includes paroxysmal SVT for which she is on amiodarone.  Of note, not on a beta-blocker as an outpatient.   No history of routine or recent alcohol consumption.  No history of recreational drug use.  Denies any recent use of acetaminophen.  No recent trauma or travel.   In the setting of recurrence of the patient's nausea/vomiting, EMS was contacted, and administered a single dose of Zofran and subsequently brought the patient to [facility] emergency department for further evaluation management of the above.  HOSPITAL COURSE BY PROBLEM   acute calculus cholecystitis----abdominal ultrasound findings noted -Elevated LFTs noted--??  If he passed a stone however alk phos and T. bili not elevated -He was treated briefly with Rocephin and Flagyl -Antiemetics as needed pain medications as ordered 10/19/22 -Discussed with-GI and general surgery teams,  official consults from GI and general surgery appreciated no interventions planned at this time -HIDA scan on 10/19/2022 unremarkable -In retrospect suspect patient may have had a biliary colic in the setting of cholelithiasis -cardiology planning to follow up LFTs in 1 week to retest labs outpatient  -holding losartan, atorvastatin, spironolactone until follow up labs reviewed from cardiology visit next week  acute on chronic biventricular combined diastolic and systolic heart failure--- in the  setting of acute right  lower extremity DVT and acute calculus cholecystitis -Echo from October 2023 with EF of 20 to 25% -Chest x-ray consistent with CHF with bilateral pleural effusions right greater than left BNP 3.338 -Repeat Echo from 10/18/22 is 20% with grade 2 DD -Discussed with cardiology team, official cardiology consult appreciated -Continue to hold Aldactone and losartan -demadex every other day and potassium supplement  Acute Rt LE DVT--- c/n IV heparin drip--transitioned to apixaban prior to discharge -Chronic anemia and chronic thrombocytopenia noted---  appear stable -monitored for bleeding while in hospital with none found -discussed with pharm D and transitioned to oral apixaban with bleeding precautions -recommend checking CBC in 1 week with other labs in the cardiology office   Acute Transaminitis--- suspect secondary to #1 above -Decompensated CHF with hepatic congestion may be contributory --??  If he passed a gall stone however alk phos and T. bili not elevated -Acute viral hepatitis profile is negative -Serum acetaminophen is not elevated HIDA scan is unremarkable   10/19/22 -LFTs trending down, recheck in 1 week outpatient with cardiology    AKI----acute kidney injury on CKD stage -3B renally adjust medications, avoid nephrotoxic agents / dehydration  / hypotension 10/19/22 -Creatinine peaked at 1.95  -currently trending back down -Voiding well -recheck BMP in 1 week with cardiology outpatient    DM2-A1c 8.4 in October 2023 reflecting uncontrolled diabetes with hyperglycemia Use Novolog/Humalog Sliding scale insulin with Accu-Cheks/Fingersticks as ordered   CBG (last 3)  Recent Labs    10/19/22 1601 10/19/22 1946 10/20/22 0716  GLUCAP 238* 209* 125*    chronic anemia/Chronic Thrombocytopenia--- suspect related to underlying CKD -Hgb and platelets currently close to baseline -recommend checking CBC in 1 week with other labs    Hypothyroidism-- -continue  levothyroxine -TSH 0.67   Social/Ethics--- plan of care discussed with patient's son [name] and patient's daughter-in-law at bedside -Questions answered -Remains a DNR   acute hypoxic respiratory failure secondary to CHF exacerbation--- -wean to room air as able     Generalized weakness---Phy therapy rec HH PT (ordered)   Discharge Diagnoses:  Principal Problem:   Acute on chronic systolic CHF (congestive heart failure) ([place]) Active Problems:   Essential hypertension   DM2 (diabetes mellitus, type 2) (HCC)   Intractable nausea and vomiting   Transaminitis   Acute renal failure superimposed on stage 3b chronic kidney disease ([place])   Acquired hypothyroidism   History of anemia due to chronic kidney disease   Acute exacerbation of CHF (congestive heart failure) (HCC)   Acute hepatitis   Nausea and vomiting   Gallstones   Elevated LFTs   Ischemic hepatitis   Discharge Instructions:  Allergies as of 10/20/2022   No Known Allergies      Medication List     STOP taking these medications    atorvastatin 10 MG tablet Commonly known as: LIPITOR   losartan 50 MG tablet Commonly known as: COZAAR   spironolactone 25 MG tablet Commonly known as: ALDACTONE       TAKE these medications    amiodarone 200 MG tablet Commonly known as: PACERONE Take 1/2 (one-half) tablet by mouth once daily   ammonium lactate 12 % cream Commonly known as: AMLACTIN Apply topically 2 (two) times daily.   apixaban 5 MG Tabs tablet Commonly known as: ELIQUIS Take 2 tablets (10 mg total) by mouth 2 (two) times daily for 7 days, THEN 1 tablet (5 mg total) 2 (two) times daily. Start taking on: October 20, 2022   aspirin EC 81 MG tablet Take 1 tablet (81 mg total) by mouth daily.   dorzolamide-timolol 2-0.5 % ophthalmic solution Commonly known as: COSOPT Place 1 drop into both eyes 2 (two) times daily.   doxazosin 4 MG tablet Commonly known as: CARDURA Take 2 mg by  mouth at bedtime.   glipiZIDE 2.5 MG 24 hr tablet Commonly known as: GLUCOTROL XL Take 2.5 mg by mouth every morning. What changed: Another medication with the same name was removed. Continue taking this medication, and follow the directions you see here.   latanoprost 0.005 % ophthalmic solution Commonly known as: XALATAN Place 1 drop into both eyes every evening.   levothyroxine 88 MCG tablet Commonly known as: SYNTHROID Take 88 mcg by mouth daily. What changed: Another medication with the same name was removed. Continue taking this medication, and follow the directions you see here.   nitroGLYCERIN 0.4 MG SL tablet Commonly known as: NITROSTAT Place 1 tablet (0.4 mg total) under the tongue every 5 (five) minutes x 3 doses as needed for chest pain.   potassium chloride 10 MEQ tablet Commonly known as: KLOR-CON M Take 0.5 tablets (5 mEq total) by mouth daily.   SYSTANE OP Apply 1 drop to eye as needed (dry eye).   torsemide 20 MG tablet Commonly known as: DEMADEX Take 1 tablet (20 mg total) by mouth daily. Take one tablet every other day        Follow-up Information     [place] Nation, MD. Schedule an appointment as soon as possible for a visit in 1 week(s).   Specialty: Internal Medicine Why: Hospital Follow Up Contact information: [place] [address] [phone]         Lorretta Harp, MD. Schedule an appointment as soon as possible for a visit in 1 week(s).   Specialties: Cardiology, Radiology Why: Hospital Follow Up Contact information: [address] [place] [address] [phone]                No Known Allergies Allergies as of 10/20/2022   No Known Allergies      Medication List     STOP taking these medications    atorvastatin 10 MG tablet Commonly known as: LIPITOR   losartan 50 MG tablet Commonly known as: COZAAR   spironolactone 25 MG tablet Commonly known as: ALDACTONE        TAKE these medications    amiodarone 200 MG tablet Commonly known as: PACERONE Take 1/2 (one-half) tablet by mouth once daily   ammonium lactate 12 % cream Commonly known as: AMLACTIN Apply topically 2 (two) times daily.   apixaban 5 MG Tabs tablet Commonly known as: ELIQUIS Take 2 tablets (10 mg total) by mouth 2 (two) times daily for 7 days, THEN 1 tablet (5 mg total) 2 (two) times daily. Start taking on: October 20, 2022   aspirin EC 81 MG tablet Take 1 tablet (81 mg total) by mouth daily.   dorzolamide-timolol 2-0.5 % ophthalmic solution Commonly known as: COSOPT Place 1 drop into both eyes 2 (two) times daily.   doxazosin 4 MG tablet Commonly known as: CARDURA Take 2 mg by mouth at bedtime.   glipiZIDE 2.5 MG 24 hr tablet Commonly known as: GLUCOTROL XL Take 2.5 mg by mouth every morning. What changed: Another medication with the same name was removed. Continue taking this medication, and follow the directions you see here.   latanoprost 0.005 %  ophthalmic solution Commonly known as: XALATAN Place 1 drop into both eyes every evening.   levothyroxine 88 MCG tablet Commonly known as: SYNTHROID Take 88 mcg by mouth daily. What changed: Another medication with the same name was removed. Continue taking this medication, and follow the directions you see here.   nitroGLYCERIN 0.4 MG SL tablet Commonly known as: NITROSTAT Place 1 tablet (0.4 mg total) under the tongue every 5 (five) minutes x 3 doses as needed for chest pain.   potassium chloride 10 MEQ tablet Commonly known as: KLOR-CON M Take 0.5 tablets (5 mEq total) by mouth daily.   SYSTANE OP Apply 1 drop to eye as needed (dry eye).   torsemide 20 MG tablet Commonly known as: DEMADEX Take 1 tablet (20 mg total) by mouth daily. Take one tablet every other day        Procedures/Studies: NM Hepatobiliary Liver Func  Result Date: 10/19/2022 CLINICAL DATA:  Cholecystitis EXAM: NUCLEAR MEDICINE  HEPATOBILIARY IMAGING TECHNIQUE: Sequential images of the abdomen were obtained out to 60 minutes following intravenous administration of radiopharmaceutical. RADIOPHARMACEUTICALS:  5.1 mCi Tc-80m Choletec IV COMPARISON:  None Available. FINDINGS: Prompt clearance radiotracer from blood pool and homogeneous uptake in liver. Counts are evident in the small bowel by 20 minutes. Gallbladder begins to fill at 30 minutes and continues to fill over 1 hour interval. IMPRESSION: 1. Patent cystic duct. Filling of the gallbladder excludes acute cholecystitis. 2. Patent common bile duct. Electronically Signed   By: SSuzy BouchardM.D.   On: 10/19/2022 09:53   ECHOCARDIOGRAM COMPLETE  Result Date: 10/18/2022    ECHOCARDIOGRAM REPORT   Patient Name:   JSTEVENS PUENTESDate of Exam: 10/18/2022 Medical Rec #:  [id number]      Height:       71.0 in Accession #:    [id number]     Weight:       154.3 lb Date of Birth:  3April 07, 1923      BSA:          1.889 m Patient Age:    89years        BP:           105/68 mmHg Patient Gender: M               HR:           57 bpm. Exam [facility]  [facility]Procedure: 2D Echo, Cardiac Doppler and Color Doppler Indications:    R06.00 Dyspnea  History:        Patient has prior history of Echocardiogram examinations, most                 recent 06/17/2022. CHF, CAD; Risk Factors:Hypertension, Diabetes                 and Dyslipidemia.  Sonographer:    BAlvino ChapelRCS Referring Phys: [phone] EMOKPAE IMPRESSIONS  1. Left ventricular ejection fraction, by estimation, is approximately 20%. The left ventricle has severely decreased function. The left ventricle demonstrates global hypokinesis. The left ventricular internal cavity size was moderately dilated. Left ventricular diastolic parameters are consistent with Grade II diastolic dysfunction (pseudonormalization).  2. Right ventricular systolic function is severely reduced. The right ventricular size is mildly enlarged. There is mildly  elevated pulmonary artery systolic pressure. The estimated right ventricular systolic pressure is [id number].  3. Left atrial size was severely dilated.  4. Right atrial size was moderately dilated.  5. The mitral  valve is grossly normal. Mild mitral valve regurgitation.  6. Tricuspid valve regurgitation is mild to moderate.  7. The aortic valve is tricuspid. There is mild calcification of the aortic valve. Aortic valve regurgitation is not visualized.  8. The inferior vena cava is dilated in size with >50% respiratory variability, suggesting right atrial pressure of 8 mmHg. Comparison(s): Prior images reviewed side by side. LV and RV function are stable. FINDINGS  Left Ventricle: Left ventricular ejection fraction, by estimation, is 20%. The left ventricle has severely decreased function. The left ventricle demonstrates global hypokinesis. The left ventricular internal cavity size was moderately dilated. There is  no left ventricular hypertrophy. Left ventricular diastolic parameters are consistent with Grade II diastolic dysfunction (pseudonormalization). Right Ventricle: The right ventricular size is mildly enlarged. No increase in right ventricular wall thickness. Right ventricular systolic function is severely reduced. There is mildly elevated pulmonary artery systolic pressure. The tricuspid regurgitant velocity is 2.76 m/s, and with an assumed right atrial pressure of 8 mmHg, the estimated right ventricular systolic pressure is [id number] mmHg. Left Atrium: Left atrial size was severely dilated. Right Atrium: Right atrial size was moderately dilated. Pericardium: There is no evidence of pericardial effusion. Mitral Valve: The mitral valve is grossly normal. There is mild thickening of the mitral valve leaflet(s). Mild mitral valve regurgitation. Tricuspid Valve: The tricuspid valve is grossly normal. Tricuspid valve regurgitation is mild to moderate. Aortic Valve: The aortic valve is tricuspid. There is mild  calcification of the aortic valve. There is mild aortic valve annular calcification. Aortic valve regurgitation is not visualized. Pulmonic Valve: The pulmonic valve was grossly normal. Pulmonic valve regurgitation is trivial. Aorta: The aortic root is normal in size and structure. Venous: The inferior vena cava is dilated in size with greater than 50% respiratory variability, suggesting right atrial pressure of 8 mmHg. IAS/Shunts: No atrial level shunt detected by color flow Doppler.  LEFT VENTRICLE PLAX 2D LVIDd:         6.80 cm      Diastology LVIDs:         6.00 cm      LV e' medial:    3.08 cm/s LV PW:         0.90 cm      LV E/e' medial:  23.5 LV IVS:        0.80 cm      LV e' lateral:   3.00 cm/s LVOT diam:     2.20 cm      LV E/e' lateral: 24.2 LV SV:         32 LV SV Index:   17 LVOT Area:     3.80 cm  LV Volumes (MOD) LV vol d, MOD A2C: 172.0 ml LV vol d, MOD A4C: 190.0 ml LV vol s, MOD A2C: 125.0 ml LV vol s, MOD A4C: 150.0 ml LV SV MOD A2C:     47.0 ml LV SV MOD A4C:     190.0 ml LV SV MOD BP:      41.7 ml RIGHT VENTRICLE RV S prime:     4.78 cm/s TAPSE (M-mode): 1.4 cm LEFT ATRIUM              Index        RIGHT ATRIUM           Index LA diam:        4.50 cm  2.38 cm/m   RA Area:     26.30 cm LA Vol (A2C):  171.0 ml 90.55 ml/m  RA Volume:   95.30 ml  50.46 ml/m LA Vol (A4C):   104.0 ml 55.07 ml/m LA Biplane Vol: 134.0 ml 70.95 ml/m  AORTIC VALVE LVOT Vmax:   52.90 cm/s LVOT Vmean:  33.400 cm/s LVOT VTI:    0.084 m  AORTA Ao Root diam: 3.70 cm MITRAL VALVE               TRICUSPID VALVE MV Area (PHT): 2.73 cm    TR Peak grad:   30.5 mmHg MV Decel Time: 278 msec    TR Vmax:        276.00 cm/s MR Peak grad: 60.2 mmHg MR Mean grad: 36.0 mmHg    SHUNTS MR Vmax:      388.00 cm/s  Systemic VTI:  0.08 m MR Vmean:     276.0 cm/s   Systemic Diam: 2.20 cm MV E velocity: 72.50 cm/s Rozann Lesches MD Electronically signed by Rozann Lesches MD Signature Date/Time: 10/18/2022/3:19:02 PM    Final    US  Venous Img Lower Bilateral (DVT)  Result Date: 10/17/2022 CLINICAL DATA:  Bilateral lower extremity edema. EXAM: BILATERAL LOWER EXTREMITY VENOUS DOPPLER ULTRASOUND TECHNIQUE: Gray-scale sonography with graded compression, as well as color Doppler and duplex ultrasound were performed to evaluate the lower extremity deep venous systems from the level of the common femoral vein and including the common femoral, femoral, profunda femoral, popliteal and calf veins including the posterior tibial, peroneal and gastrocnemius veins when visible. The superficial great saphenous vein was also interrogated. Spectral Doppler was utilized to evaluate flow at rest and with distal augmentation maneuvers in the common femoral, femoral and popliteal veins. COMPARISON:  Right lower extremity venous Doppler ultrasound 06/16/2022 FINDINGS: RIGHT LOWER EXTREMITY Common Femoral Vein: No evidence of thrombus. Normal compressibility, respiratory phasicity and response to augmentation. Saphenofemoral Junction: No evidence of thrombus. Normal compressibility and flow on color Doppler imaging. Profunda Femoral Vein: No evidence of thrombus. Normal compressibility and flow on color Doppler imaging. Femoral Vein: No evidence of thrombus. Normal compressibility, respiratory phasicity and response to augmentation. Popliteal Vein: Noncompressible with nonocclusive thrombus. Calf Veins: No evidence of thrombus. Normal compressibility and flow on color Doppler imaging. Superficial Great Saphenous Vein: No evidence of thrombus. Normal compressibility. Venous Reflux:  None. Other Findings:  None. LEFT LOWER EXTREMITY Common Femoral Vein: No evidence of thrombus. Normal compressibility, respiratory phasicity and response to augmentation. Saphenofemoral Junction: No evidence of thrombus. Normal compressibility and flow on color Doppler imaging. Profunda Femoral Vein: No evidence of thrombus. Normal compressibility and flow on color Doppler imaging.  Femoral Vein: No evidence of thrombus. Normal compressibility, respiratory phasicity and response to augmentation. Popliteal Vein: No evidence of thrombus. Normal compressibility, respiratory phasicity and response to augmentation. Calf Veins: No evidence of thrombus. Normal compressibility and flow on color Doppler imaging. Superficial Great Saphenous Vein: No evidence of thrombus. Normal compressibility. Venous Reflux:  None. Other Findings:  None. IMPRESSION: 1. Positive for nonocclusive DVT in the right popliteal vein. 2. No evidence of DVT in the left lower extremity. Electronically Signed   By: Logan Bores M.D.   On: 10/17/2022 11:35   US Abdomen Limited RUQ (LIVER/GB)  Result Date: 10/17/2022 CLINICAL DATA:  Nausea and vomiting EXAM: ULTRASOUND ABDOMEN LIMITED RIGHT UPPER QUADRANT COMPARISON:  CT scan of the abdomen and pelvis performed earlier today FINDINGS: Gallbladder: Echogenic material within the gallbladder lumen consistent with cholelithiasis. There is evidence of posterior acoustic shadowing. The gallbladder wall is diffusely thickened measuring  up to 7 mm. Per the sonographer, the sonographic Percell Miller sign was negative. Trace pericholecystic fluid is visualized. Common bile duct: Diameter: Normal at 6 mm. Liver: No focal lesion identified. Within normal limits in parenchymal echogenicity. Portal vein is patent on color Doppler imaging with normal direction of blood flow towards the liver. Other: None. IMPRESSION: Cholelithiasis with diffuse gallbladder wall thickening and pericholecystic fluid. In the appropriate clinical setting, findings are concerning for acute calculus cholecystitis. Electronically Signed   By: Jacqulynn Cadet M.D.   On: 10/17/2022 09:04   DG Chest Port 1 View  Result Date: 10/17/2022 CLINICAL DATA:  Congestive heart failure. EXAM: PORTABLE CHEST 1 VIEW COMPARISON:  06/16/2022 FINDINGS: 0645 hours. The cardio pericardial silhouette is enlarged. Interstitial markings are  diffusely coarsened with chronic features. Bibasilar atelectasis with small bilateral pleural effusions evident. Airspace disease at the right base not excluded. Bones are diffusely demineralized. Telemetry leads overlie the chest. IMPRESSION: Bibasilar atelectasis with small bilateral pleural effusions, right greater than left. Right basilar pneumonia cannot be excluded. Electronically Signed   By: Misty Stanley M.D.   On: 10/17/2022 06:55   CT ABDOMEN PELVIS W CONTRAST  Result Date: 10/17/2022 CLINICAL DATA:  Abdominal pain, acute, nonlocalized, vomiting EXAM: CT ABDOMEN AND PELVIS WITH CONTRAST TECHNIQUE: Multidetector CT imaging of the abdomen and pelvis was performed using the standard protocol following bolus administration of intravenous contrast. RADIATION DOSE REDUCTION: This exam was performed according to the departmental dose-optimization program which includes automated exposure control, adjustment of the mA and/or kV according to patient size and/or use of iterative reconstruction technique. CONTRAST:  39m OMNIPAQUE IOHEXOL 300 MG/ML  SOLN COMPARISON:  None Available. FINDINGS: Lower chest: Mild cardiomegaly. Extensive coronary artery calcification. Moderate right and trace left pleural effusions are present with associated bibasilar compressive atelectasis. Small hiatal hernia. Hepatobiliary: There is marked periportal edema which may relate to congestive hepatopathy, but is nonspecific. Scattered hypodensities within the liver are nonspecific, possibly representing small hepatic cysts or hemangioma in a patient without a history of malignancy. No enhancing intrahepatic mass. No intra or extrahepatic biliary ductal dilation. Cholelithiasis noted. Pancreas: Innumerable cystic lesions arise from the body and tail the pancreas likely representing multiple dilated pancreatic side branches or, less likely, multifocal intraductal pancreatic mucinous neoplasm. The largest cystic lesion measures 2.6 cm  at axial image # 17/2. No peripancreatic inflammatory changes identified. Spleen: Unremarkable Adrenals/Urinary Tract: The adrenal glands are unremarkable. The kidneys are normal in size and position. There is a 17 mm hyperdense lesion arising from the lower pole of the left kidney measuring 78 Hounsfield units on early arterial phase imaging and 97 Hounsfield units on delayed phase imaging in keeping with a primary renal neoplasm. No hydronephrosis. No intrarenal or ureteral calculi. The bladder is unremarkable. Stomach/Bowel: Mild ascites. The stomach, small bowel, and large bowel are otherwise unremarkable. The appendix is normal. No free intraperitoneal gas. Vascular/Lymphatic: No pathologic adenopathy within the abdomen and pelvis. Extensive aortoiliac atherosclerotic calcification. No aortic aneurysm. Reproductive: Marked prostatic enlargement. Other: Small bilateral inguinal hernias are present. There is mild diffuse subcutaneous body wall edema. Musculoskeletal: Osseous structures are diffusely osteopenic. No acute bone abnormality. Mild remote appearing compression deformity of L1. IMPRESSION: 1. No acute intra-abdominal pathology identified. No definite radiographic explanation for the patient's reported symptoms. 2. Mild cardiomegaly. Extensive coronary artery calcification. 3. Moderate right and trace left pleural effusions, mild ascites, and mild diffuse subcutaneous body wall edema in keeping with mild anasarca. 4. Marked periportal edema, nonspecific, but possibly  related to congestive hepatopathy given the above findings. Acute inflammatory conditions of the liver or biliary tree, however, could appear similarly and correlation with liver enzymes is recommended. 5. Cholelithiasis. 6. Innumerable cystic lesions arising from the body and tail the pancreas likely representing multiple dilated pancreatic side branches or, less likely, multifocal intraductal pancreatic mucinous neoplasm. The largest  cystic lesion measures 2.6 cm. If clinically indicated, follow-up MRI examination in 6 months is recommended to document stability. 7. 17 mm hyperdense lesion arising from the lower pole of the left kidney in keeping with a primary renal neoplasm. Urologic consultation may be helpful for further management. 8. Marked prostatic enlargement. Aortic Atherosclerosis (ICD10-I70.0). Electronically Signed   By: Fidela Salisbury M.D.   On: 10/17/2022 02:01     Subjective: Pt reports overall feeling much better, breathing better, no chest pain, no SOB.   Discharge Exam: Vitals:   10/19/22 1944 10/20/22 0506  BP: 106/74 106/71  Pulse: 84 60  Resp: 18 18  Temp: (!) 97.5 F (36.4 C) (!) 97.5 F (36.4 C)  SpO2: 97% 98%   Vitals:   10/19/22 0932 10/19/22 1312 10/19/22 1944 10/20/22 0506  BP:  105/63 106/74 106/71  Pulse:  95 84 60  Resp:  18 18 18  $ Temp:  (!) 97.5 F (36.4 C) (!) 97.5 F (36.4 C) (!) 97.5 F (36.4 C)  TempSrc:  Oral Oral Oral  SpO2: 97% 100% 97% 98%  Weight:    67.3 kg  Height:       General: Pt is an elderly male, alert, awake, not in acute distress Cardiovascular: normal S1/S2 +, no rubs, no gallops Respiratory: no increased work of breathing, no wheezing, no rhonchi Abdominal: Soft, NT, ND, bowel sounds + Extremities: trace pretibial edema, no cyanosis   The results of significant diagnostics from this hospitalization (including imaging, microbiology, ancillary and laboratory) are listed below for reference.     Microbiology: No results found for this or any previous visit (from the past 240 hour(s)).   Labs: BNP (last 3 results) Recent Labs    06/20/22 0402 07/09/22 1514 10/17/22 0738  BNP 1,874.0* 2,115.6* [id number]   Basic Metabolic Panel: Recent Labs  Lab 10/17/22 0035 10/17/22 0553 10/18/22 0444 10/19/22 1003 10/20/22 0446  NA 138 137 139 137 137  K 5.0 5.0 4.2 4.2 4.0  CL 109 109 111 105 105  CO2 19* 20* 19* 23 22  GLUCOSE 289* 261* 89 152* 131*   BUN 64* 66* 58* 48* 45*  CREATININE 1.92* 1.95* 1.68* 1.64* 1.54*  CALCIUM 8.1* 8.6* 8.4* 8.5* 8.6*  MG  --  2.6*  --   --   --    Liver Function Tests: Recent Labs  Lab 10/17/22 0035 10/17/22 0553 10/18/22 0444 10/19/22 1003 10/20/22 0446  AST 501* 366* 103* 46* 28  ALT 483* 471* 279* 195* 142*  ALKPHOS 117 112 93 90 77  BILITOT 0.7 0.6 0.7 0.5 0.7  PROT 5.5* 5.5* 5.2* 5.7* 5.2*  ALBUMIN 3.2* 3.3* 3.0* 3.3* 3.0*   Recent Labs  Lab 10/17/22 0035  LIPASE 37   No results for input(s): "AMMONIA" in the last 168 hours. CBC: Recent Labs  Lab 10/17/22 0035 10/17/22 0553 10/18/22 0444 10/19/22 0442 10/20/22 0446  WBC 8.2 7.8 6.7 6.3 5.7  NEUTROABS  --  6.2  --   --   --   HGB 11.8* 12.2* 12.2* 10.9* 12.1*  HCT 36.5* 38.0* 38.0* 33.8* 36.8*  MCV 97.6 97.4 98.2 97.7  96.6  PLT 111* 114* 114* 112* 124*   Cardiac Enzymes: No results for input(s): "CKTOTAL", "CKMB", "CKMBINDEX", "TROPONINI" in the last 168 hours. BNP: Invalid input(s): "POCBNP" CBG: Recent Labs  Lab 10/19/22 0729 10/19/22 1107 10/19/22 1601 10/19/22 1946 10/20/22 0716  GLUCAP 130* 172* 238* 209* 125*   D-Dimer No results for input(s): "DDIMER" in the last 72 hours. Hgb A1c No results for input(s): "HGBA1C" in the last 72 hours. Lipid Profile No results for input(s): "CHOL", "HDL", "LDLCALC", "TRIG", "CHOLHDL", "LDLDIRECT" in the last 72 hours. Thyroid function studies No results for input(s): "TSH", "T4TOTAL", "T3FREE", "THYROIDAB" in the last 72 hours.  Invalid input(s): "FREET3" Anemia work up No results for input(s): "VITAMINB12", "FOLATE", "FERRITIN", "TIBC", "IRON", "RETICCTPCT" in the last 72 hours. Urinalysis    Component Value Date/Time   COLORURINE YELLOW 10/17/2022 [place] 10/17/2022 0934   LABSPEC 1.016 10/17/2022 0934   PHURINE 5.0 10/17/2022 0934   GLUCOSEU NEGATIVE 10/17/2022 0934   HGBUR SMALL (A) 10/17/2022 0934   BILIRUBINUR NEGATIVE 10/17/2022 0934    KETONESUR NEGATIVE 10/17/2022 0934   PROTEINUR 30 (A) 10/17/2022 0934   UROBILINOGEN 1.0 10/03/2012 1151   NITRITE NEGATIVE 10/17/2022 0934   LEUKOCYTESUR TRACE (A) 10/17/2022 0934   Sepsis Labs Recent Labs  Lab 10/17/22 0553 10/18/22 0444 10/19/22 0442 10/20/22 0446  WBC 7.8 6.7 6.3 5.7   Microbiology No results found for this or any previous visit (from the past 240 hour(s)).  Time coordinating discharge: 44 mins   SIGNED:  Irwin Brakeman, MD  [facility] 10/20/2022, 10:24 AM How to contact the [facility] Attending or Consulting provider [place] or covering provider during after hours [place], for this patient?  Check the care team in [facility] and look for a) attending/consulting [facility] provider listed and b) the [facility] team listed Log into [email or web address] and use [place]'s universal password to access. If you do not have the password, please contact the hospital operator. Locate the [facility] provider you are looking for under [facility] and page to a number that you can be directly reached. If you still have difficulty reaching the provider, please page the [facility] (Director on Call) for the Hospitalists listed on amion for assistance.

## 2022-10-20 NOTE — TOC Benefit Eligibility Note (Signed)
Patient Teacher, English as a foreign language completed.    The patient is currently admitted and upon discharge could be taking Eliquis.  The current 30 day co-pay is $90.00.   The patient is insured through [employer]

## 2022-10-27 NOTE — Progress Notes (Deleted)
Cardiology Office Note   Date:  10/27/2022   ID:  Franklin Gonzalez, DOB 29-Mar-1922, MRN [id number]  PCP:  [place] Nation, MD  Cardiologist:  Dr. Stanford Breed    No chief complaint on file.     History of Present Illness: Franklin Gonzalez is a 89 y.o. male who presents for ***   89 y.o. male with a hx of CAD s/p CABG 1993 at [facility], NSTEMI 09/2012 treated with PCI/DES to SVG-RCA and DES-SVG G-OM, mild cardiomyopathy with an LVEF 45-50%, SVT in the setting of NSTEMI, carotid artery stenosis, hypertension, hyperlipidemia, DM 2.  He is maintained on amiodarone 100 mg daily for SVT.  LVEF was noted at 25% in 06/2020. Cardiomyopathy has been treated conservatively with losartan and torsemide, no additional ischemic evaluation given his age.  He did not tolerate Entresto and has a history of bradycardia.    Oct 2023 admitted for acute combined CHF EF 20-25% severely reduced RV with PASP 47 mmHg severe biatrial enlargement trivial AI   recent admit at [facility] with back pain, and sudden emesis, + CHF.  Son manages his HF with torsemide gives extra dose with wt increase.   On admit BNP 3338, XR bilateral pleural effusions R>L. Started on IV lasix drip b/c of soft BP's.  Concurrently diagnosed with right leg DVT and suspected cholecystitis. Placed on IV Heparin.  Placed on IV flagyl for cholecystitis - hx SVT on amiodarone. Echo follow up with EF 20% with severe RV dysfunction, + renal insuff. Losartan and aldactone held.    HIDA scan reviewed by GI and surgical team. He was on empiric antibiotics. No abdominal pain and taking p.o.'s. He did have transaminitis which is improving, question passed stone or some contribution from heart failure. Lipitor held as well with elevated LFTs  discharged 10/20/22  to home.  Here for repeat labs and medication adjustment  Last Cr 1.54  last Hgb 12.1 Today ***   Past Medical History:  Diagnosis Date   Bifascicular block    Coronary artery disease    CABG 1993 at  [facility]; NSTEMI 09/2012 s/p DES to SVG-OM1, DES to SVG-PDA    DM (diabetes mellitus) ([place])    HFrEF (heart failure with reduced ejection fraction) (HCC)    HTN (hypertension)    SVT (supraventricular tachycardia)     Past Surgical History:  Procedure Laterality Date   [place] CABG   LEFT HEART CATHETERIZATION WITH CORONARY/GRAFT ANGIOGRAM N/A 10/04/2012   Procedure: LEFT HEART CATHETERIZATION WITH Beatrix Fetters;  Surgeon: Sherren Mocha, MD;  Location: [facility] CATH LAB;  Service: Cardiovascular;  Laterality: N/A;     Current Outpatient Medications  Medication Sig Dispense Refill   amiodarone (PACERONE) 200 MG tablet Take 1/2 (one-half) tablet by mouth once daily 45 tablet 3   ammonium lactate (AMLACTIN) 12 % cream Apply topically 2 (two) times daily.     apixaban (ELIQUIS) 5 MG TABS tablet Take 2 tablets (10 mg total) by mouth 2 (two) times daily for 7 days, THEN 1 tablet (5 mg total) 2 (two) times daily. 60 tablet 1   aspirin EC 81 MG EC tablet Take 1 tablet (81 mg total) by mouth daily.     dorzolamide-timolol (COSOPT) 22.3-6.8 MG/ML ophthalmic solution Place 1 drop into both eyes 2 (two) times daily.     doxazosin (CARDURA) 4 MG tablet Take 2 mg by mouth at bedtime.     glipiZIDE (GLUCOTROL XL) 2.5 MG 24 hr tablet  Take 2.5 mg by mouth every morning.     latanoprost (XALATAN) 0.005 % ophthalmic solution Place 1 drop into both eyes every evening.     levothyroxine (SYNTHROID) 88 MCG tablet Take 88 mcg by mouth daily.     nitroGLYCERIN (NITROSTAT) 0.4 MG SL tablet Place 1 tablet (0.4 mg total) under the tongue every 5 (five) minutes x 3 doses as needed for chest pain. 60 tablet 3   Polyethyl Glycol-Propyl Glycol (SYSTANE OP) Apply 1 drop to eye as needed (dry eye).     potassium chloride (KLOR-CON M) 10 MEQ tablet Take 0.5 tablets (5 mEq total) by mouth daily. 90 tablet 1   torsemide (DEMADEX) 20 MG tablet Take 1 tablet (20 mg total) by mouth daily. Take  one tablet every other day 30 tablet 2   No current facility-administered medications for this visit.    Allergies:   Patient has no known allergies.    Social History:  The patient  reports that he has never smoked. He has never used smokeless tobacco. He reports that he does not drink alcohol and does not use drugs.   Family History:  The patient's ***family history includes CAD in an other family member.    ROS:  General:no colds or fevers, no weight changes Skin:no rashes or ulcers HEENT:no blurred vision, no congestion CV:see HPI PUL:see HPI GI:no diarrhea constipation or melena, no indigestion GU:no hematuria, no dysuria MS:no joint pain, no claudication Neuro:no syncope, no lightheadedness Endo:no diabetes, no thyroid disease Wt Readings from Last 3 Encounters:  10/20/22 148 lb 5.9 oz (67.3 kg)  08/16/22 142 lb 9.6 oz (64.7 kg)  07/09/22 149 lb 12.8 oz (67.9 kg)     PHYSICAL EXAM: VS:  There were no vitals taken for this visit. , BMI There is no height or weight on file to calculate BMI. General:Pleasant affect, NAD Skin:Warm and dry, brisk capillary refill HEENT:normocephalic, sclera clear, mucus membranes moist Neck:supple, no JVD, no bruits  Heart:S1S2 RRR without murmur, gallup, rub or click Lungs:clear without rales, rhonchi, or wheezes [id number], non tender, + BS, do not palpate liver spleen or masses Ext:no lower ext edema, 2+ pedal pulses, 2+ radial pulses Neuro:alert and oriented, MAE, follows commands, + facial symmetry    EKG:  EKG is ordered today. The ekg ordered today demonstrates ***   Recent Labs: 10/17/2022: B Natriuretic Peptide 3,338.0; Magnesium 2.6; TSH 0.676 10/20/2022: ALT 142; BUN 45; Creatinine, Ser 1.54; Hemoglobin 12.1; Platelets 124; Potassium 4.0; Sodium 137    Lipid Panel    Component Value Date/Time   CHOL 132 04/14/2018 0844   TRIG 68 04/14/2018 0844   HDL 66 04/14/2018 0844   CHOLHDL 2.0 04/14/2018 0844   CHOLHDL 2.2  02/14/2015 0905   VLDL 7 02/14/2015 0905   LDLCALC 52 04/14/2018 0844       Other studies Reviewed: Additional studies/ records that were reviewed today include: ***.   ASSESSMENT AND PLAN:  1.  ***   Current medicines are reviewed with the patient today.  The patient Has no concerns regarding medicines.  The following changes have been made:  See above Labs/ tests ordered today include:see above  Disposition:   FU:  see above  Signed, Cecilie Kicks, NP  10/27/2022 9:58 PM    [facility] [place], [place], [address] [place], [place] Phone: [phone]; Fax: [phone]

## 2022-10-28 ENCOUNTER — Emergency Department (HOSPITAL_COMMUNITY): Payer: Medicare Other

## 2022-10-28 ENCOUNTER — Encounter (HOSPITAL_COMMUNITY): Payer: Self-pay | Admitting: Emergency Medicine

## 2022-10-28 ENCOUNTER — Other Ambulatory Visit: Payer: Self-pay

## 2022-10-28 ENCOUNTER — Emergency Department (HOSPITAL_COMMUNITY)
Admission: EM | Admit: 2022-10-28 | Discharge: 2022-10-28 | Disposition: A | Discharge status: Home / Self Care | Payer: Medicare Other | Attending: Emergency Medicine | Admitting: Emergency Medicine

## 2022-10-28 ENCOUNTER — Ambulatory Visit: Payer: Medicare Other | Admitting: Cardiology

## 2022-10-28 DIAGNOSIS — Z7901 Long term (current) use of anticoagulants: Secondary | ICD-10-CM | POA: Insufficient documentation

## 2022-10-28 DIAGNOSIS — I110 Hypertensive heart disease with heart failure: Secondary | ICD-10-CM | POA: Insufficient documentation

## 2022-10-28 DIAGNOSIS — R600 Localized edema: Secondary | ICD-10-CM | POA: Insufficient documentation

## 2022-10-28 DIAGNOSIS — Z951 Presence of aortocoronary bypass graft: Secondary | ICD-10-CM | POA: Diagnosis not present

## 2022-10-28 DIAGNOSIS — Z79899 Other long term (current) drug therapy: Secondary | ICD-10-CM | POA: Insufficient documentation

## 2022-10-28 DIAGNOSIS — I2510 Atherosclerotic heart disease of native coronary artery without angina pectoris: Secondary | ICD-10-CM | POA: Diagnosis not present

## 2022-10-28 DIAGNOSIS — R569 Unspecified convulsions: Secondary | ICD-10-CM | POA: Insufficient documentation

## 2022-10-28 DIAGNOSIS — R7989 Other specified abnormal findings of blood chemistry: Secondary | ICD-10-CM | POA: Diagnosis not present

## 2022-10-28 DIAGNOSIS — I5043 Acute on chronic combined systolic (congestive) and diastolic (congestive) heart failure: Secondary | ICD-10-CM | POA: Diagnosis not present

## 2022-10-28 DIAGNOSIS — Z7984 Long term (current) use of oral hypoglycemic drugs: Secondary | ICD-10-CM | POA: Insufficient documentation

## 2022-10-28 DIAGNOSIS — Z7982 Long term (current) use of aspirin: Secondary | ICD-10-CM | POA: Diagnosis not present

## 2022-10-28 DIAGNOSIS — E119 Type 2 diabetes mellitus without complications: Secondary | ICD-10-CM | POA: Insufficient documentation

## 2022-10-28 LAB — CBC WITH DIFFERENTIAL/PLATELET
Abs Immature Granulocytes: 0.04 10*3/uL (ref 0.00–0.07)
Basophils Absolute: 0 10*3/uL (ref 0.0–0.1)
Basophils Relative: 0 %
Eosinophils Absolute: 0 10*3/uL (ref 0.0–0.5)
Eosinophils Relative: 1 %
HCT: 35.7 % — ABNORMAL LOW (ref 39.0–52.0)
Hemoglobin: 11.7 g/dL — ABNORMAL LOW (ref 13.0–17.0)
Immature Granulocytes: 1 %
Lymphocytes Relative: 17 %
Lymphs Abs: 1 10*3/uL (ref 0.7–4.0)
MCH: 32 pg (ref 26.0–34.0)
MCHC: 32.8 g/dL (ref 30.0–36.0)
MCV: 97.5 fL (ref 80.0–100.0)
Monocytes Absolute: 0.5 10*3/uL (ref 0.1–1.0)
Monocytes Relative: 8 %
Neutro Abs: 4.5 10*3/uL (ref 1.7–7.7)
Neutrophils Relative %: 73 %
Platelets: 123 10*3/uL — ABNORMAL LOW (ref 150–400)
RBC: 3.66 MIL/uL — ABNORMAL LOW (ref 4.22–5.81)
RDW: 14.7 % (ref 11.5–15.5)
WBC: 6.1 10*3/uL (ref 4.0–10.5)
nRBC: 0 % (ref 0.0–0.2)

## 2022-10-28 LAB — COMPREHENSIVE METABOLIC PANEL
ALT: 75 U/L — ABNORMAL HIGH (ref 0–44)
AST: 35 U/L (ref 15–41)
Albumin: 3.4 g/dL — ABNORMAL LOW (ref 3.5–5.0)
Alkaline Phosphatase: 59 U/L (ref 38–126)
Anion gap: 12 (ref 5–15)
BUN: 59 mg/dL — ABNORMAL HIGH (ref 8–23)
CO2: 24 mmol/L (ref 22–32)
Calcium: 8.2 mg/dL — ABNORMAL LOW (ref 8.9–10.3)
Chloride: 98 mmol/L (ref 98–111)
Creatinine, Ser: 2 mg/dL — ABNORMAL HIGH (ref 0.61–1.24)
GFR, Estimated: 30 mL/min — ABNORMAL LOW (ref >60)
Glucose, Bld: 229 mg/dL — ABNORMAL HIGH (ref 70–99)
Potassium: 4.2 mmol/L (ref 3.5–5.1)
Sodium: 134 mmol/L — ABNORMAL LOW (ref 135–145)
Total Bilirubin: 0.9 mg/dL (ref 0.3–1.2)
Total Protein: 5.5 g/dL — ABNORMAL LOW (ref 6.5–8.1)

## 2022-10-28 LAB — BRAIN NATRIURETIC PEPTIDE: B Natriuretic Peptide: 2711 pg/mL — ABNORMAL HIGH (ref 0.0–100.0)

## 2022-10-28 NOTE — ED Triage Notes (Signed)
Patient bib [facility]. Called out by caretaker for seizure like activity. The aid stated to EMS patient was convulsing for less than one minute. EMS states patient has been at his baseline since they arrived. Son is at bedside who states the same.   Pts son states he was d/c home from the hospital x1week ago and has had a 5lb weight gain since. Family has been giving pt diuretics but pt states he has been unable to void as much as he usually does.

## 2022-10-28 NOTE — ED Provider Notes (Addendum)
[facility] Provider Note   CSN: [id number] Arrival date & time: 10/28/22  1450     History  Chief Complaint  Patient presents with   Seizures    Franklin Gonzalez is a 89 y.o. male.  Patient brought in by EMS.  They were called out by the caretaker for seizure activity.  Family member did not witness it but was told by the caretaker it lasted 5 minutes.  EMS stated that they were told lasted about a minute.  When EMS arrived he was back at his baseline.  Confirmed by his son.  Patient was recently admitted to the hospital February 11 through February 14.  Has a history of congestive heart failure and needed some diuresis during that hospitalization.  No known history of seizures.  Patient is a DNR.  Family states he has had some weight gain despite taking his furosemide.  Patient's oxygen saturation here is very good mid to upper 90 percentile.  Patient in no acute distress.  Past medical history significant for coronary artery disease the diabetes congestive heart failure hypertension bifascicular block supraventricular tachycardia.  Patient never used tobacco products.  Regarding the coronary disease had a CABG in 1993 at [facility] had a non-STEMI in 2014 had stents placed.  Medications of significance is Demadex 20 mg says by mouth daily take 1 tablet every other day.  Patient on potassium supplement patient on glipizide patient on aspirin.  Patient on Eliquis and patient taking Pacerone.       Home Medications Prior to Admission medications   Medication Sig Start Date End Date Taking? Authorizing Provider  amiodarone (PACERONE) 200 MG tablet Take 1/2 (one-half) tablet by mouth once daily 12/22/21   Lelon Perla, MD  ammonium lactate (AMLACTIN) 12 % cream Apply topically 2 (two) times daily. 06/07/22   [provider]  apixaban (ELIQUIS) 5 MG TABS tablet Take 2 tablets (10 mg total) by mouth 2 (two) times daily for 7  days, THEN 1 tablet (5 mg total) 2 (two) times daily. 10/20/22 11/26/22  Murlean Iba, MD  aspirin EC 81 MG EC tablet Take 1 tablet (81 mg total) by mouth daily. 10/07/12   Edmisten, Brooke O, PA-C  dorzolamide-timolol (COSOPT) 22.3-6.8 MG/ML ophthalmic solution Place 1 drop into both eyes 2 (two) times daily. 09/21/21   [provider]  doxazosin (CARDURA) 4 MG tablet Take 2 mg by mouth at bedtime.    [provider]  glipiZIDE (GLUCOTROL XL) 2.5 MG 24 hr tablet Take 2.5 mg by mouth every morning. 09/22/22   [provider]  latanoprost (XALATAN) 0.005 % ophthalmic solution Place 1 drop into both eyes every evening. 10/17/21   [provider]  levothyroxine (SYNTHROID) 88 MCG tablet Take 88 mcg by mouth daily. 11/15/21   [provider]  nitroGLYCERIN (NITROSTAT) 0.4 MG SL tablet Place 1 tablet (0.4 mg total) under the tongue every 5 (five) minutes x 3 doses as needed for chest pain. 10/07/12   Edmisten, Azzie Roup, PA-C  Polyethyl Glycol-Propyl Glycol (SYSTANE OP) Apply 1 drop to eye as needed (dry eye).    [provider]  potassium chloride (KLOR-CON M) 10 MEQ tablet Take 0.5 tablets (5 mEq total) by mouth daily. 07/09/22   Duke, Tami Lin, PA  torsemide (DEMADEX) 20 MG tablet Take 1 tablet (20 mg total) by mouth daily. Take one tablet every other day 06/20/22   Mercy Riding, MD  Allergies    Patient has no known allergies.    Review of Systems   Review of Systems  Constitutional:  Negative for chills and fever.  HENT:  Negative for ear pain and sore throat.   Eyes:  Negative for pain and visual disturbance.  Respiratory:  Negative for cough and shortness of breath.   Cardiovascular:  Positive for leg swelling. Negative for chest pain and palpitations.  Gastrointestinal:  Negative for abdominal pain and vomiting.  Genitourinary:  Negative for dysuria and hematuria.  Musculoskeletal:  Negative for arthralgias and back pain.  Skin:   Negative for color change and rash.  Neurological:  Positive for seizures. Negative for syncope.  All other systems reviewed and are negative.   Physical Exam Updated Vital Signs BP 109/67 (BP Location: Right Arm)   Pulse 61   Temp 97.6 F (36.4 C) (Oral)   Resp 16   Ht 1.803 m (5' 11"$ )   Wt 66.7 kg   SpO2 100%   BMI 20.50 kg/m  Physical Exam Vitals and nursing note reviewed.  Constitutional:      General: He is not in acute distress.    Appearance: Normal appearance. He is well-developed. He is not ill-appearing.  HENT:     Head: Normocephalic and atraumatic.  Eyes:     Conjunctiva/sclera: Conjunctivae normal.  Cardiovascular:     Rate and Rhythm: Normal rate and regular rhythm.     Heart sounds: No murmur heard. Pulmonary:     Effort: Pulmonary effort is normal. No respiratory distress.     Breath sounds: Normal breath sounds. No wheezing, rhonchi or rales.  Abdominal:     Palpations: Abdomen is soft.     Tenderness: There is no abdominal tenderness.  Musculoskeletal:        General: No swelling.     Cervical back: Neck supple.     Right lower leg: Edema present.     Left lower leg: Edema present.  Skin:    General: Skin is warm and dry.     Capillary Refill: Capillary refill takes less than 2 seconds.  Neurological:     Mental Status: He is alert. Mental status is at baseline.  Psychiatric:        Mood and Affect: Mood normal.     ED Results / Procedures / Treatments   Labs (all labs ordered are listed, but only abnormal results are displayed) Labs Reviewed  CBC WITH DIFFERENTIAL/PLATELET - Abnormal; Notable for the following components:      Result Value   RBC 3.66 (*)    Hemoglobin 11.7 (*)    HCT 35.7 (*)    Platelets 123 (*)    All other components within normal limits  COMPREHENSIVE METABOLIC PANEL - Abnormal; Notable for the following components:   Sodium 134 (*)    Glucose, Bld 229 (*)    BUN 59 (*)    Creatinine, Ser 2.00 (*)    Calcium 8.2  (*)    Total Protein 5.5 (*)    Albumin 3.4 (*)    ALT 75 (*)    GFR, Estimated 30 (*)    All other components within normal limits  BRAIN NATRIURETIC PEPTIDE - Abnormal; Notable for the following components:   B Natriuretic Peptide 2,711.0 (*)    All other components within normal limits    EKG EKG Interpretation  Date/Time:  Thursday October 28 2022 15:23:36 EST Ventricular Rate:  63 PR Interval:    QRS Duration: 202 QT  Interval:  540 QTC Calculation: 553 R Axis:   -88 Text Interpretation: Junctional rhythm Nonspecific IVCD with LAD LVH with secondary repolarization abnormality No significant change since last tracing Confirmed by Fredia Sorrow [phone]) on 10/28/2022 3:42:49 PM  Radiology DG Chest Port 1 View  Result Date: 10/28/2022 CLINICAL DATA:  Seizure, CHF EXAM: PORTABLE CHEST 1 VIEW COMPARISON:  10/17/2022 FINDINGS: Single frontal view of the chest demonstrates an enlarged cardiac silhouette. Stable ectasia and atherosclerosis of the thoracic aorta. Scattered areas of bibasilar consolidation and small bilateral pleural effusions, right greater than left, not appreciably changed. No pneumothorax. No acute bony abnormalities. IMPRESSION: 1. Stable bibasilar consolidation and small bilateral effusions, right greater than left. 2. Stable enlarged cardiac silhouette. Electronically Signed   By: Randa Ngo M.D.   On: 10/28/2022 15:38    Procedures Procedures    Medications Ordered in ED Medications - No data to display  ED Course/ Medical Decision Making/ A&P                             Medical Decision Making Amount and/or Complexity of Data Reviewed Labs: ordered. Radiology: ordered.   CBC white blood cell count 6.1 hemoglobin 11.7 platelets 0000000 complete metabolic panel sodium [id number] potassium good at 4.2 glucose 229 BUN 59 creatinine 2 for a GFR of 30.  Liver function test without significant abnormalities ALT up just a little bit at 75 alk phos and total  bili normal.  Anion gap normal.  BNP markedly elevated at 2700.  Chest x-ray stable bibasilar consolidation and small bilateral effusions right greater than left.  CT head is also pending.  CT head without any acute findings.  CT chest shows bilateral pleural effusions no significant pulmonary edema.  Patient's BNP is elevated and patient's renal function a little bit worse than usual.  May have been dried out by taking his furosemide every day for the last few days.  Since there is no significant pulmonary edema and he is not in any respiratory distress would recommend going back to the furosemide every other day.  Have his renal function rechecked next week.  If he runs into any respiratory distress get him seen right away.  If there is any further seizure-like activity get him seen right away.  Not clear at this point in time whether that was true seizure that occurred.  Follow-up with primary care doctor appropriate.   Final Clinical Impression(s) / ED Diagnoses Final diagnoses:  Seizure-like activity ([place])  Acute on chronic combined systolic and diastolic congestive heart failure [facility])    Rx / DC Orders ED Discharge Orders     None         Fredia Sorrow, MD 10/28/22 1639    Fredia Sorrow, MD 10/28/22 [phone]

## 2022-10-28 NOTE — Discharge Instructions (Signed)
CT of the chest showed no significant pulmonary edema.  Did show bilateral pleural effusions.  Oxygen saturations are very good.  Would recommend going back on the furosemide every other day.  Have his kidney function rechecked next week.  Kidney functions a little bit worse than usual this is probably due to the diuretic drying it out.  But it is always possible he could be going into a renal failure.  Electrolytes were normal.  Return for any further seizure like activity.  And return for any shortness of breath.

## 2022-11-03 ENCOUNTER — Telehealth: Payer: Self-pay | Admitting: Cardiology

## 2022-11-03 NOTE — Telephone Encounter (Signed)
Pt son called in stating pt has had another episode where he is "shaking and moaning". He states pt had a CT done and it was normal so he is wondering if this has to do with him being off his medications. They discontinued (atorvastatin, losarton, and spirolactone) in the hospital.   Pt swelling in right leg with a blood clot behind his knee. He did have some SOB today, he states "that's my observation, he's  was in labored breathing" but not at the moment of the call. Please advise.

## 2022-11-03 NOTE — Telephone Encounter (Signed)
Called spoke with pt's son. He states pt was taken off Atorvastatin, Losarton, and Spirolactone. Pt was going to see cardiologist. Had the episode before seeing cardiologist. Pt is to see Dr. Stanford Breed 4/29. Pt is still not taking the medication, the hospital told son not to give medication until seen by cardiologist. Appt made for 3/5.

## 2022-11-04 ENCOUNTER — Encounter: Payer: Self-pay | Admitting: Radiology

## 2022-11-05 NOTE — Telephone Encounter (Signed)
Patient has passed

## 2022-11-05 DEATH — deceased

## 2022-11-09 ENCOUNTER — Ambulatory Visit: Payer: Medicare Other | Admitting: Student

## 2022-11-23 ENCOUNTER — Ambulatory Visit: Payer: Self-pay | Admitting: *Deleted

## 2022-11-23 NOTE — Patient Outreach (Signed)
[facility]) Care Management   11/23/22   Franklin Gonzalez Jan 26, 1922 [id number]  Case closure  Patient passed on 11/14/2022   [name] L. Lavina Hamman, RN, BSN, [place] Coordinator Office number [phone]

## 2022-12-18 ENCOUNTER — Other Ambulatory Visit: Payer: Self-pay | Admitting: Cardiology

## 2022-12-18 DIAGNOSIS — N1832 Chronic kidney disease, stage 3b: Secondary | ICD-10-CM

## 2022-12-18 DIAGNOSIS — I5021 Acute systolic (congestive) heart failure: Secondary | ICD-10-CM

## 2023-01-03 ENCOUNTER — Ambulatory Visit: Payer: Medicare Other | Admitting: Cardiology
